# Patient Record
Sex: Male | Born: 1935 | Race: White | Hispanic: No | Smoking: Never smoker
Health system: Southern US, Community
[De-identification: ages and names within clinical notes are randomized; demographics above are authoritative.]

## PROBLEM LIST (undated history)

## (undated) DIAGNOSIS — C159 Malignant neoplasm of esophagus, unspecified: Secondary | ICD-10-CM

## (undated) DIAGNOSIS — Z8546 Personal history of malignant neoplasm of prostate: Secondary | ICD-10-CM

## (undated) DIAGNOSIS — E785 Hyperlipidemia, unspecified: Secondary | ICD-10-CM

## (undated) DIAGNOSIS — H9319 Tinnitus, unspecified ear: Secondary | ICD-10-CM

## (undated) DIAGNOSIS — Z933 Colostomy status: Secondary | ICD-10-CM

## (undated) DIAGNOSIS — I10 Essential (primary) hypertension: Secondary | ICD-10-CM

## (undated) DIAGNOSIS — R17 Unspecified jaundice: Secondary | ICD-10-CM

## (undated) DIAGNOSIS — Z860100 Personal history of colon polyps, unspecified: Secondary | ICD-10-CM

## (undated) DIAGNOSIS — C20 Malignant neoplasm of rectum: Secondary | ICD-10-CM

## (undated) DIAGNOSIS — K219 Gastro-esophageal reflux disease without esophagitis: Secondary | ICD-10-CM

## (undated) DIAGNOSIS — Z936 Other artificial openings of urinary tract status: Secondary | ICD-10-CM

## (undated) DIAGNOSIS — B179 Acute viral hepatitis, unspecified: Secondary | ICD-10-CM

## (undated) DIAGNOSIS — Z973 Presence of spectacles and contact lenses: Secondary | ICD-10-CM

## (undated) DIAGNOSIS — M1990 Unspecified osteoarthritis, unspecified site: Secondary | ICD-10-CM

## (undated) DIAGNOSIS — N179 Acute kidney failure, unspecified: Secondary | ICD-10-CM

## (undated) DIAGNOSIS — D72829 Elevated white blood cell count, unspecified: Secondary | ICD-10-CM

## (undated) DIAGNOSIS — Z8719 Personal history of other diseases of the digestive system: Secondary | ICD-10-CM

## (undated) HISTORY — PX: OTHER SURGICAL HISTORY: SHX169

## (undated) HISTORY — DX: Unspecified osteoarthritis, unspecified site: M19.90

## (undated) HISTORY — DX: Malignant neoplasm of esophagus, unspecified: C15.9

## (undated) HISTORY — DX: Essential (primary) hypertension: I10

## (undated) HISTORY — DX: Gastro-esophageal reflux disease without esophagitis: K21.9

## (undated) HISTORY — PX: HERNIA REPAIR: SHX51

## (undated) HISTORY — DX: Colostomy status: Z93.3

## (undated) HISTORY — DX: Malignant neoplasm of rectum: C20

## (undated) HISTORY — PX: COLONOSCOPY: SHX174

---
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
## 1998-10-27 ENCOUNTER — Ambulatory Visit (HOSPITAL_COMMUNITY): Admission: RE | Admit: 1998-10-27 | Discharge: 1998-10-27 | Payer: Self-pay | Admitting: Gastroenterology

## 1998-11-05 ENCOUNTER — Other Ambulatory Visit: Admission: RE | Admit: 1998-11-05 | Discharge: 1998-11-05 | Payer: Self-pay | Admitting: Urology

## 1998-11-17 ENCOUNTER — Encounter: Admission: RE | Admit: 1998-11-17 | Discharge: 1999-02-15 | Payer: Self-pay | Admitting: Radiation Oncology

## 1999-01-19 DIAGNOSIS — Z8546 Personal history of malignant neoplasm of prostate: Secondary | ICD-10-CM

## 1999-01-19 HISTORY — DX: Personal history of malignant neoplasm of prostate: Z85.46

## 1999-02-06 ENCOUNTER — Encounter: Payer: Self-pay | Admitting: Urology

## 1999-02-06 ENCOUNTER — Ambulatory Visit (HOSPITAL_BASED_OUTPATIENT_CLINIC_OR_DEPARTMENT_OTHER): Admission: RE | Admit: 1999-02-06 | Discharge: 1999-02-06 | Payer: Self-pay | Admitting: Urology

## 1999-03-04 ENCOUNTER — Encounter: Admission: RE | Admit: 1999-03-04 | Discharge: 1999-06-02 | Payer: Self-pay | Admitting: Radiation Oncology

## 2000-11-30 ENCOUNTER — Encounter (INDEPENDENT_AMBULATORY_CARE_PROVIDER_SITE_OTHER): Payer: Self-pay | Admitting: Specialist

## 2000-11-30 ENCOUNTER — Ambulatory Visit (HOSPITAL_COMMUNITY): Admission: RE | Admit: 2000-11-30 | Discharge: 2000-11-30 | Payer: Self-pay | Admitting: Gastroenterology

## 2004-11-17 ENCOUNTER — Ambulatory Visit: Payer: Self-pay | Admitting: Gastroenterology

## 2004-11-30 ENCOUNTER — Encounter (INDEPENDENT_AMBULATORY_CARE_PROVIDER_SITE_OTHER): Payer: Self-pay | Admitting: Specialist

## 2004-11-30 ENCOUNTER — Ambulatory Visit: Payer: Self-pay | Admitting: Gastroenterology

## 2005-02-11 ENCOUNTER — Ambulatory Visit: Payer: Self-pay | Admitting: Gastroenterology

## 2005-03-04 ENCOUNTER — Ambulatory Visit: Payer: Self-pay | Admitting: Gastroenterology

## 2005-11-02 ENCOUNTER — Ambulatory Visit (HOSPITAL_COMMUNITY): Admission: RE | Admit: 2005-11-02 | Discharge: 2005-11-02 | Payer: Self-pay | Admitting: Family Medicine

## 2006-07-14 ENCOUNTER — Ambulatory Visit (HOSPITAL_COMMUNITY): Admission: RE | Admit: 2006-07-14 | Discharge: 2006-07-14 | Payer: Self-pay | Admitting: Family Medicine

## 2008-06-14 ENCOUNTER — Ambulatory Visit (HOSPITAL_COMMUNITY): Admission: RE | Admit: 2008-06-14 | Discharge: 2008-06-14 | Payer: Self-pay | Admitting: Family Medicine

## 2010-05-22 ENCOUNTER — Ambulatory Visit: Payer: Self-pay | Admitting: Urology

## 2010-05-29 ENCOUNTER — Ambulatory Visit (INDEPENDENT_AMBULATORY_CARE_PROVIDER_SITE_OTHER): Payer: Medicare Other | Admitting: Urology

## 2010-05-29 DIAGNOSIS — Z8546 Personal history of malignant neoplasm of prostate: Secondary | ICD-10-CM

## 2010-05-29 DIAGNOSIS — N4340 Spermatocele of epididymis, unspecified: Secondary | ICD-10-CM

## 2010-05-29 DIAGNOSIS — N529 Male erectile dysfunction, unspecified: Secondary | ICD-10-CM

## 2010-06-05 NOTE — Op Note (Signed)
[facility]  Patient:    [name]                       MRN: [id number] Proc. Date: 02/06/99 Adm. Date:  [id number] Attending:  Evlyn Clines CC:         Billie Lade, M.D.             Vania Rea. Jarold Motto, M.D. [facility]                           Operative Report  PREOPERATIVE DIAGNOSIS:  Localized prostate cancer.  POSTOPERATIVE DIAGNOSIS:  Localized prostate cancer.  PROCEDURE:  Prostate I-125 seed implantation and cystoscopy.  SURGEON:  Excell Seltzer. Annabell Howells, M.D.  RADIATION ONCOLOGIST:  Billie Lade, M.D.  ANESTHESIA:  General.  DRAINS:  Foley.  COMPLICATIONS:  None.  INDICATIONS:  The patient is an 77 year old white male who was found to have PSA of 6 and nodule on rectal exam.  He was biopsied and found to have a Gleason VI adenocarcinoma involving 10% of the left prostate and some focal glandular atypia. After discussing the treatment options, he elected seed implantation.  FINDINGS AND DESCRIPTION OF PROCEDURE:  The patient was taken to the operating oom where he received Ancef.  A general anesthetic was induced.  He was placed in the lithotomy position with sling stirrups.  His genitalia was prepped with Betadine solution and a Foley catheter was inserted.  The balloon was filled with dilute  contrast.  The perineum was shaved.  The scrotum was reflected cephalad with a towel and Op-Site.  A red rubber rectal catheter was placed.  The ultrasound probe was assembled and inserted, and adjusted to conform with the pretreatment plan.   Once the ultrasound was secured, the perineum was prepped with Betadine solution and he was draped with a sterile towel.  The needle guide template was in place. The ultrasound was set at a reference plane of 1.5 cm from the base.  An anchor  needle was placed at C3.0 and measurements were made to ensure proper location rom the base.  That needle was removed and two stabilization  needles were then placed at C3.0 and E3.0.  The needles were then placed according to plan.  A total of  80 seeds were implanted with 20 needles, all of them with four strand Vicryl. he implant went without complication.  After completion of the implantation, films were taken with the probe in and out. The drapes were then removed and the Foley catheter was removed.  The penis was  reprepped.  Cystoscopy was performed with a 16-French flexible scope. Examination revealed a normal urethra.  Prostate ________.  No evidence of suture or seeds were noted in the urethra.  The bladder was also free of any seeds.  The bladder was without lesions.  The ureteral orifices were in their normal anatomic position with efflux of clear urine.  After completion of cystoscopy, a fresh Foley catheter was inserted.  The balloon was filled with 5 cc of sterile fluid, and the catheter was placed to straight drainage.  The perineum was cleansed and a dressing was applied.  The patient was taken down from the lithotomy position, anesthetic was reversed, and he was moved to the recovery room in stable condition.  There were no complications. DD:  02/06/99 TD:  02/07/99 Job: [id number]

## 2010-12-04 ENCOUNTER — Ambulatory Visit (INDEPENDENT_AMBULATORY_CARE_PROVIDER_SITE_OTHER): Payer: Medicare Other | Admitting: Urology

## 2010-12-04 DIAGNOSIS — N529 Male erectile dysfunction, unspecified: Secondary | ICD-10-CM

## 2010-12-04 DIAGNOSIS — Z8546 Personal history of malignant neoplasm of prostate: Secondary | ICD-10-CM

## 2010-12-04 DIAGNOSIS — N5089 Other specified disorders of the male genital organs: Secondary | ICD-10-CM

## 2011-08-13 ENCOUNTER — Ambulatory Visit (INDEPENDENT_AMBULATORY_CARE_PROVIDER_SITE_OTHER): Payer: Medicare Other | Admitting: Urology

## 2011-08-13 DIAGNOSIS — N529 Male erectile dysfunction, unspecified: Secondary | ICD-10-CM

## 2011-08-13 DIAGNOSIS — Z8546 Personal history of malignant neoplasm of prostate: Secondary | ICD-10-CM

## 2011-08-13 DIAGNOSIS — N4340 Spermatocele of epididymis, unspecified: Secondary | ICD-10-CM

## 2011-11-12 ENCOUNTER — Encounter (INDEPENDENT_AMBULATORY_CARE_PROVIDER_SITE_OTHER): Payer: Self-pay | Admitting: *Deleted

## 2011-11-17 ENCOUNTER — Encounter: Payer: Self-pay | Admitting: Gastroenterology

## 2011-11-17 ENCOUNTER — Encounter (INDEPENDENT_AMBULATORY_CARE_PROVIDER_SITE_OTHER): Payer: Self-pay | Admitting: Internal Medicine

## 2011-11-17 ENCOUNTER — Ambulatory Visit (INDEPENDENT_AMBULATORY_CARE_PROVIDER_SITE_OTHER): Payer: Medicare Other | Admitting: Internal Medicine

## 2011-11-17 VITALS — BP 144/70 | HR 60 | Temp 98.1°F | Ht 68.0 in | Wt 199.0 lb

## 2011-11-17 DIAGNOSIS — C61 Malignant neoplasm of prostate: Secondary | ICD-10-CM | POA: Insufficient documentation

## 2011-11-17 DIAGNOSIS — E7800 Pure hypercholesterolemia, unspecified: Secondary | ICD-10-CM | POA: Insufficient documentation

## 2011-11-17 DIAGNOSIS — R197 Diarrhea, unspecified: Secondary | ICD-10-CM

## 2011-11-17 DIAGNOSIS — I10 Essential (primary) hypertension: Secondary | ICD-10-CM | POA: Insufficient documentation

## 2011-11-17 DIAGNOSIS — K635 Polyp of colon: Secondary | ICD-10-CM | POA: Insufficient documentation

## 2011-11-17 DIAGNOSIS — Z86010 Personal history of colonic polyps: Secondary | ICD-10-CM

## 2011-11-17 NOTE — Patient Instructions (Addendum)
F/u with Dr. Jarold Motto concerning hx of polyps. Over due for surveillance colonoscopy.

## 2011-11-17 NOTE — Progress Notes (Signed)
Subjective:     Patient ID: Ian Christensen, male   DOB: Dec 18, 1931, 78 y.o.   MRN: [id number]  HPIReferred to our office for a positive Lactoferrin.  He tells me he ate pork about 3 months that was not done.  He tells me after that he developed diarrhea. Diarrhea lasted about 1 week.  He was 2-3 loose stools a day. He also had urgency. He had to be near the bathroom. He continued to have loose stools. Symptoms resolved in the past 2 weeks. No weight loss. BMs are almost normal now. Last colonoscopy has been in the last 10 yrs. He says he has colonic polyps.  Hx of colonic polyps.  Colonoscopy in 2006: Dr. Jarold Motto: FINAL DIAGNOSIS   MICROSCOPIC EXAMINATION AND DIAGNOSIS   COLON, ASCENDING POLYP: TUBULAR ADENOMA. NO HIGH GRADE DYSPLASIA OR MALIGNANCY IDENTIFIED.    Review of Systems  See hpi Current Outpatient Prescriptions  Medication Sig Dispense Refill  . hydrochlorothiazide (HYDRODIURIL) 25 MG tablet Take 25 mg by mouth daily.      Marland Kitchen losartan (COZAAR) 50 MG tablet Take 50 mg by mouth daily.      . metoprolol succinate (TOPROL-XL) 50 MG 24 hr tablet Take 50 mg by mouth daily. Take with or immediately following a meal.      . omeprazole (PRILOSEC) 40 MG capsule Take 40 mg by mouth daily.      . simvastatin (ZOCOR) 20 MG tablet Take 20 mg by mouth every evening.       Past Medical History  Diagnosis Date  . Prostate cancer     in remission 2001. (seed implants)  . High cholesterol   . HTN (hypertension)    Past Surgical History  Procedure Date  . Appendectomy    History   Social History  . Marital Status: Married    Spouse Name: N/A    Number of Children: N/A  . Years of Education: N/A   Occupational History  . Not on file.   Social History Main Topics  . Smoking status: Never Smoker   . Smokeless tobacco: Not on file  . Alcohol Use: No  . Drug Use: No  . Sexually Active: Not on file   Other Topics Concern  . Not on file   Social History Narrative  . No  narrative on file   Family Status  Relation Status Death Age  . Mother Deceased     age 6  . Father Deceased     CAD age 110  . Sister Deceased     Two deceased from stomach cancer. One in a nursing home with Alzheimer's  . Brother Deceased     Two CAD, One MVC, One prostate cancer.   Not on File     Objective:   Physical Exam  Filed Vitals:   11/17/11 1027  BP: 144/70  Pulse: 60  Temp: 98.1 F (36.7 C)  Height: 5\' 8"  (1.727 m)  Weight: 199 lb (90.266 kg)   Alert and oriented. Skin warm and dry. Oral mucosa is moist.   . Sclera anicteric, conjunctivae is pink. Thyroid not enlarged. No cervical lymphadenopathy. Lungs clear. Heart regular rate and rhythm.  Abdomen is soft. Bowel sounds are positive. No hepatomegaly. No abdominal masses felt. No tenderness.  No edema to lower extremities.       Assessment:    Diarrhea, infectious, resolved at this time. Positive lactoferrin.     Plan:    I think patient may need follow up  colonoscopy for hx of colonic tubular adenoma . Patient would like for Dr. Jarold Motto to do the colonoscopy. Declined colonoscopy today with Dr. Karilyn Cota.

## 2011-12-03 ENCOUNTER — Encounter (INDEPENDENT_AMBULATORY_CARE_PROVIDER_SITE_OTHER): Payer: Self-pay

## 2011-12-22 ENCOUNTER — Ambulatory Visit (AMBULATORY_SURGERY_CENTER): Payer: Medicare Other

## 2011-12-22 ENCOUNTER — Encounter: Payer: Self-pay | Admitting: Gastroenterology

## 2011-12-22 VITALS — Ht 68.0 in | Wt 197.0 lb

## 2011-12-22 DIAGNOSIS — Z1211 Encounter for screening for malignant neoplasm of colon: Secondary | ICD-10-CM

## 2011-12-22 MED ORDER — MOVIPREP 100 G PO SOLR: 1.0000 | Freq: Once | ORAL | Status: DC

## 2011-12-28 ENCOUNTER — Telehealth: Payer: Self-pay | Admitting: Gastroenterology

## 2011-12-28 NOTE — Telephone Encounter (Signed)
Pt reports he would like to cancel his COLON for 01/05/12. He reports he got sick after eating bad pork at a gathering and that's what caused the diarrhea. He hasn't had any bowel changes or bleeding or any other signs of Colon Cancer; his doctor is the one who suggested he have the COLON and he received a Recall letter. He states he will call if he wants to r/s. Advised pt if he had a polyp 7 years ago, he needs to consider a repeat COLON; states he know that but doesn't feel he needs one now.

## 2012-01-05 ENCOUNTER — Encounter: Payer: Medicare Other | Admitting: Gastroenterology

## 2012-08-04 ENCOUNTER — Ambulatory Visit (INDEPENDENT_AMBULATORY_CARE_PROVIDER_SITE_OTHER): Payer: Medicare Other | Admitting: Urology

## 2012-08-04 DIAGNOSIS — C61 Malignant neoplasm of prostate: Secondary | ICD-10-CM

## 2012-08-04 DIAGNOSIS — N529 Male erectile dysfunction, unspecified: Secondary | ICD-10-CM

## 2012-08-04 DIAGNOSIS — N4340 Spermatocele of epididymis, unspecified: Secondary | ICD-10-CM

## 2013-01-26 ENCOUNTER — Ambulatory Visit (INDEPENDENT_AMBULATORY_CARE_PROVIDER_SITE_OTHER): Payer: Medicare Other | Admitting: Urology

## 2013-01-26 DIAGNOSIS — Z8546 Personal history of malignant neoplasm of prostate: Secondary | ICD-10-CM

## 2013-01-26 DIAGNOSIS — N4340 Spermatocele of epididymis, unspecified: Secondary | ICD-10-CM

## 2013-03-01 ENCOUNTER — Ambulatory Visit (INDEPENDENT_AMBULATORY_CARE_PROVIDER_SITE_OTHER): Payer: Medicare Other | Admitting: Otolaryngology

## 2013-03-01 DIAGNOSIS — H6980 Other specified disorders of Eustachian tube, unspecified ear: Secondary | ICD-10-CM

## 2013-03-01 DIAGNOSIS — H903 Sensorineural hearing loss, bilateral: Secondary | ICD-10-CM

## 2013-03-01 DIAGNOSIS — H6990 Unspecified Eustachian tube disorder, unspecified ear: Secondary | ICD-10-CM

## 2013-08-03 ENCOUNTER — Ambulatory Visit (INDEPENDENT_AMBULATORY_CARE_PROVIDER_SITE_OTHER): Payer: Medicare Other | Admitting: Urology

## 2013-08-03 ENCOUNTER — Other Ambulatory Visit: Payer: Self-pay | Admitting: Urology

## 2013-08-03 DIAGNOSIS — N529 Male erectile dysfunction, unspecified: Secondary | ICD-10-CM

## 2013-08-03 DIAGNOSIS — C61 Malignant neoplasm of prostate: Secondary | ICD-10-CM

## 2013-08-03 DIAGNOSIS — N4340 Spermatocele of epididymis, unspecified: Secondary | ICD-10-CM

## 2013-09-19 ENCOUNTER — Encounter (HOSPITAL_COMMUNITY): Payer: Self-pay

## 2013-09-19 ENCOUNTER — Encounter (HOSPITAL_COMMUNITY)
Admission: RE | Admit: 2013-09-19 | Discharge: 2013-09-19 | Disposition: A | Discharge status: Home / Self Care | Payer: Medicare Other | Source: Ambulatory Visit | Attending: Urology | Admitting: Urology

## 2013-09-19 DIAGNOSIS — C61 Malignant neoplasm of prostate: Secondary | ICD-10-CM | POA: Diagnosis present

## 2013-09-19 MED ORDER — TECHNETIUM TC 99M MEDRONATE IV KIT: 25.0000 | PACK | Freq: Once | INTRAVENOUS | Status: AC | PRN

## 2014-02-08 ENCOUNTER — Ambulatory Visit: Payer: Self-pay | Admitting: Urology

## 2014-08-16 ENCOUNTER — Ambulatory Visit: Payer: Self-pay | Admitting: Urology

## 2014-09-27 ENCOUNTER — Ambulatory Visit (INDEPENDENT_AMBULATORY_CARE_PROVIDER_SITE_OTHER): Payer: Medicare HMO | Admitting: Urology

## 2014-09-27 ENCOUNTER — Telehealth: Payer: Self-pay | Admitting: Gastroenterology

## 2014-09-27 DIAGNOSIS — N4340 Spermatocele of epididymis, unspecified: Secondary | ICD-10-CM

## 2014-09-27 DIAGNOSIS — K629 Disease of anus and rectum, unspecified: Secondary | ICD-10-CM

## 2014-09-27 DIAGNOSIS — C61 Malignant neoplasm of prostate: Secondary | ICD-10-CM

## 2014-09-27 NOTE — Telephone Encounter (Signed)
Bethena Roys aware of appt and will call pt with appt. Bethena Roys to fax records.

## 2014-09-27 NOTE — Telephone Encounter (Signed)
Pt scheduled to see Cecille Rubin Hvozdovic, PA-C 09/30/14@2 :15pm. Left message for Bethena Roys to call back.

## 2014-09-30 ENCOUNTER — Encounter: Payer: Self-pay | Admitting: Physician Assistant

## 2014-09-30 ENCOUNTER — Ambulatory Visit (INDEPENDENT_AMBULATORY_CARE_PROVIDER_SITE_OTHER): Payer: Medicare HMO | Admitting: Physician Assistant

## 2014-09-30 ENCOUNTER — Telehealth: Payer: Self-pay

## 2014-09-30 VITALS — BP 144/70 | HR 68 | Ht 66.5 in | Wt 198.0 lb

## 2014-09-30 DIAGNOSIS — R197 Diarrhea, unspecified: Secondary | ICD-10-CM | POA: Diagnosis not present

## 2014-09-30 DIAGNOSIS — K625 Hemorrhage of anus and rectum: Secondary | ICD-10-CM

## 2014-09-30 DIAGNOSIS — Z8546 Personal history of malignant neoplasm of prostate: Secondary | ICD-10-CM

## 2014-09-30 MED ORDER — POLYETHYLENE GLYCOL 3350 17 GM/SCOOP PO POWD: 1.0000 | Freq: Every day | ORAL | Status: DC

## 2014-09-30 NOTE — Progress Notes (Signed)
Patient ID: Ian Christensen, male   DOB: [birth year], 81 y.o.   MRN: [id number]    HPI:  Ian Christensen is a 82 y.o.   male referred by Shawnie Dapper, PA-C for evaluation of rectal bleeding. At his prior patient of Dr. Jarold Motto and had several colonoscopies with polyps. His last colonoscopy was on 11/30/2004 at which time a 5 mm sessile polyp was removed from the ascending colon. This was found to be adenomatous and he was advised to have surveillance in 3 years however he did not do so. He was evaluated at Dr. Cathie Beams office in 2013 for what was felt to be an infectious diarrhea that had recent altered. He was advised to have a surveillance colonoscopy but declined to have it done as he preferred to return to Dr. Norval Gable office.  He has a history of prostate cancer diagnosed in 2000 for which he was treated with radiation seed implants. He was evaluated by urology for follow-up on 09/27/2014 and noted to have had an increase in his PSA with no worrisome symptoms. He was also noted to have a mucosal irregularity of the rectum over the prostate bed. He was advised to see GI for evaluation. Patient states he had been moving his bowels regularly, but several weeks ago went to a restaurant that he frequents intermittently. He states he made a comment to a fellow costumer in [employer] and he did not realize was a relative of the cook. He states he ordered soup, and when the soup was brought to his table it looked as if something funny was floating on it. He ate the soup and said it tasted fine, but states that night he began to have loose stools. Since that time he has been having 2-4 mushy stools with mucus and occasionally blood. He has constant rectal pressure where he feels he has to go to the bathroom but frequently nothing comes out. He sometimes has to strain to pass a mushy stool. He has had a dull ache in the low back that comes and goes as well. He has had no associated nausea or vomiting, and  denies fever chills or night sweats. He did not have any Tobi Bastos biotics over the past several months and has not traveled out of the country.   Past Medical History  Diagnosis Date  . Prostate cancer     in remission 2001. (seed implants)  . High cholesterol   . HTN (hypertension)   . Anal fissure   . Arthritis   . Colon polyps   . GERD (gastroesophageal reflux disease)     Past Surgical History  Procedure Laterality Date  . Appendectomy  1955   Family History  Problem Relation Age of Onset  . Colon cancer Sister   . Prostate cancer Brother   . Diabetes Mother   . Heart disease Mother   . Heart disease Father   . Colon polyps Daughter     x 2   Social History  Substance Use Topics  . Smoking status: Former Smoker -- 1 years    Types: Cigarettes    Quit date: 09/30/1954  . Smokeless tobacco: Never Used  . Alcohol Use: No   Current Outpatient Prescriptions  Medication Sig Dispense Refill  . hydrochlorothiazide (HYDRODIURIL) 25 MG tablet Take 12.5 mg by mouth daily.     Marland Kitchen losartan (COZAAR) 50 MG tablet Take 50 mg by mouth daily.    . metoprolol succinate (TOPROL-XL) 50 MG 24 hr  tablet Take 50 mg by mouth daily. Take with or immediately following a meal.    . niacin 500 MG tablet Take 500 mg by mouth daily with breakfast.    . omeprazole (PRILOSEC) 20 MG capsule Take 1 capsule by mouth daily.    . simvastatin (ZOCOR) 20 MG tablet Take 20 mg by mouth every evening.    . polyethylene glycol powder (GLYCOLAX/MIRALAX) powder Take 255 g by mouth daily. 255 g 0   No current facility-administered medications for this visit.   No Known Allergies   Review of Systems: Gen: Denies any fever, chills, sweats, anorexia, fatigue, weakness, malaise, weight loss, and sleep disorder CV: Denies chest pain, angina, palpitations, syncope, orthopnea, PND, peripheral edema, and claudication. Resp: Denies dyspnea at rest, dyspnea with exercise, cough, sputum, wheezing, coughing up blood, and  pleurisy. GI: Denies vomiting blood, jaundice, and fecal incontinence.   Denies dysphagia or odynophagia. GU : Denies urinary burning, blood in urine, urinary frequency, urinary hesitancy, nocturnal urination, and urinary incontinence. MS: Denies joint pain, limitation of movement, and swelling, stiffness, low back pain, extremity pain. Denies muscle weakness, cramps, atrophy.  Derm: Denies rash, itching, dry skin, hives, moles, warts, or unhealing ulcers.  Psych: Denies depression, anxiety, memory loss, suicidal ideation, hallucinations, paranoia, and confusion. Heme: Denies bruising, bleeding, and enlarged lymph nodes. Neuro:  Denies any headaches, dizziness, paresthesias. Endo:  Denies any problems with DM, thyroid, adrenal function   Prior Endoscopies:  See history of present illness   Physical Exam: BP 144/70 mmHg  Pulse 68  Ht 5' 6.5" (1.689 m)  Wt 198 lb (89.812 kg)  BMI 31.48 kg/m2 Constitutional: Pleasant,well-developed, male in no acute distress. HEENT: Normocephalic and atraumatic. Conjunctivae are normal. No scleral icterus. Neck supple. No JVD Cardiovascular: Normal rate, regular rhythm.  Pulmonary/chest: Effort normal and breath sounds normal. No wheezing, rales or rhonchi. Abdominal: Soft, nondistended, nontender. Bowel sounds active throughout. There are no masses palpable. No hepatomegaly. Rectal: Bloody mucus on glove Extremities: no edema Lymphadenopathy: No cervical adenopathy noted. Neurological: Alert and oriented to person place and time. Skin: Skin is warm and dry. No rashes noted. Psychiatric: Normal mood and affect. Behavior is normal.  ASSESSMENT AND PLAN: 81 year old male with a history of prostate cancer status post seed implants, now with a 2-2-1/2 week history of loose stools with mucous and blood along with tenesmus. Recent exam by urology questions or rectal mass. Patient also has a history of adenomatous polyps and is overdue for surveillance. A C.  difficile PCR will be obtained. Patient will be scheduled for a colonoscopy to evaluate for polyps, neoplasia, proctitis, etc.The risks, benefits, and alternatives to colonoscopy with possible biopsy and possible polypectomy were discussed with the patient and they consent to proceed. The procedure will be scheduled with Dr. Leone Payor per patient request.    Kyli Sorter, Tollie Pizza PA-C 09/30/2014, 4:07 PM  CC: Shawnie Dapper, PA-C

## 2014-09-30 NOTE — Telephone Encounter (Signed)
Called pt and informed him to stop by our lab to pick up a stool kit. Pt understands.

## 2014-09-30 NOTE — Patient Instructions (Signed)
You have been scheduled for a colonoscopy. Please follow written instructions given to you at your visit today.  Please pick up your prep supplies at the pharmacy within the next 1-3 days. If you use inhalers (even only as needed), please bring them with you on the day of your procedure. Your physician has requested that you go to www.startemmi.com and enter the access code given to you at your visit today. This web site gives a general overview about your procedure. However, you should still follow specific instructions given to you by our office regarding your preparation for the procedure.  We have sent the following medications to your pharmacy for you to pick up at your convenience: Miralax

## 2014-10-02 ENCOUNTER — Ambulatory Visit (AMBULATORY_SURGERY_CENTER): Payer: Medicare HMO | Admitting: Internal Medicine

## 2014-10-02 ENCOUNTER — Other Ambulatory Visit: Payer: Medicare HMO

## 2014-10-02 ENCOUNTER — Other Ambulatory Visit (INDEPENDENT_AMBULATORY_CARE_PROVIDER_SITE_OTHER): Payer: Medicare HMO

## 2014-10-02 ENCOUNTER — Encounter: Payer: Self-pay | Admitting: Internal Medicine

## 2014-10-02 VITALS — BP 143/85 | HR 64 | Temp 97.1°F | Resp 17 | Ht 66.5 in | Wt 198.0 lb

## 2014-10-02 DIAGNOSIS — R198 Other specified symptoms and signs involving the digestive system and abdomen: Secondary | ICD-10-CM | POA: Diagnosis not present

## 2014-10-02 DIAGNOSIS — K6289 Other specified diseases of anus and rectum: Secondary | ICD-10-CM

## 2014-10-02 DIAGNOSIS — Z8546 Personal history of malignant neoplasm of prostate: Secondary | ICD-10-CM

## 2014-10-02 DIAGNOSIS — K625 Hemorrhage of anus and rectum: Secondary | ICD-10-CM

## 2014-10-02 DIAGNOSIS — C20 Malignant neoplasm of rectum: Secondary | ICD-10-CM | POA: Diagnosis not present

## 2014-10-02 DIAGNOSIS — R197 Diarrhea, unspecified: Secondary | ICD-10-CM

## 2014-10-02 LAB — COMPREHENSIVE METABOLIC PANEL
ALBUMIN: 4.2 g/dL (ref 3.5–5.2)
ALK PHOS: 51 U/L (ref 39–117)
ALT: 16 U/L (ref 0–53)
AST: 20 U/L (ref 0–37)
BUN: 14 mg/dL (ref 6–23)
CALCIUM: 9.3 mg/dL (ref 8.4–10.5)
CHLORIDE: 101 meq/L (ref 96–112)
CO2: 27 mEq/L (ref 19–32)
Creatinine, Ser: 1.16 mg/dL (ref 0.40–1.50)
GFR: 64.2 mL/min (ref >60.00)
Glucose, Bld: 100 mg/dL — ABNORMAL HIGH (ref 70–99)
POTASSIUM: 3.7 meq/L (ref 3.5–5.1)
SODIUM: 139 meq/L (ref 135–145)
TOTAL PROTEIN: 6.7 g/dL (ref 6.0–8.3)
Total Bilirubin: 0.6 mg/dL (ref 0.2–1.2)

## 2014-10-02 LAB — CBC WITH DIFFERENTIAL/PLATELET
BASOS PCT: 0.8 % (ref 0.0–3.0)
Basophils Absolute: 0.1 10*3/uL (ref 0.0–0.1)
EOS PCT: 1.4 % (ref 0.0–5.0)
Eosinophils Absolute: 0.1 10*3/uL (ref 0.0–0.7)
HEMATOCRIT: 45.7 % (ref 39.0–52.0)
HEMOGLOBIN: 15.6 g/dL (ref 13.0–17.0)
LYMPHS PCT: 18 % (ref 12.0–46.0)
Lymphs Abs: 1.4 10*3/uL (ref 0.7–4.0)
MCHC: 34.1 g/dL (ref 30.0–36.0)
MCV: 94.2 fl (ref 78.0–100.0)
MONOS PCT: 6.9 % (ref 3.0–12.0)
Monocytes Absolute: 0.6 10*3/uL (ref 0.1–1.0)
NEUTROS ABS: 5.8 10*3/uL (ref 1.4–7.7)
Neutrophils Relative %: 72.9 % (ref 43.0–77.0)
PLATELETS: 213 10*3/uL (ref 150.0–400.0)
RBC: 4.85 Mil/uL (ref 4.22–5.81)
RDW: 12.7 % (ref 11.5–15.5)
WBC: 8 10*3/uL (ref 4.0–10.5)

## 2014-10-02 LAB — PSA: PSA: 7.48 ng/mL — ABNORMAL HIGH (ref 0.10–4.00)

## 2014-10-02 MED ORDER — SODIUM CHLORIDE 0.9 % IV SOLN: 500.0000 mL | INTRAVENOUS | Status: DC

## 2014-10-02 NOTE — Progress Notes (Signed)
Called to room to assist during endoscopic procedure.  Patient ID and intended procedure confirmed with present staff. Received instructions for my participation in the procedure from the performing physician.  

## 2014-10-02 NOTE — Op Note (Signed)
[facility]  [address] [place], [id number]   COLONOSCOPY PROCEDURE REPORT  PATIENT: Ian, Christensen  MR#: [id number] BIRTHDATE: 1931-08-12 , 83  yrs. old GENDER: male ENDOSCOPIST: Gatha Mayer, MD, [facility] PROCEDURE DATE:  10/02/2014 PROCEDURE:   Colonoscopy with biopsy First Screening Colonoscopy - Avg.  risk and is 50 yrs.  old or older - No.  Prior Negative Screening - Now for repeat screening. N/A  History of Adenoma - Now for follow-up colonoscopy & has been > or = to 3 yrs.  N/A  Polyps removed today? No Recommend repeat exam, <10 yrs? No ASA CLASS:   Class II INDICATIONS:Evaluation of unexplained GI bleeding and Patient is not applicable for Colorectal Neoplasm Risk Assessment for this procedure. MEDICATIONS: Propofol 200 mg IV and Monitored anesthesia care  DESCRIPTION OF PROCEDURE:   After the risks benefits and alternatives of the procedure were thoroughly explained, informed consent was obtained.  The digital rectal exam revealed a palpable rectal mass.   The LB [id number]  endoscope was introduced through the anus and advanced to the cecum, which was identified by both the appendix and ileocecal valve. No adverse events experienced.   The quality of the prep was excellent.  (MiraLax was used)  The instrument was then slowly withdrawn as the colon was fully examined. Estimated blood loss is zero unless otherwise noted in this procedure report.   COLON FINDINGS: A one-third circumferential medium firm and ulcerated mass was found in the rectum. Close to anal verge. Multiple biopsies were performed using cold forceps.   There was mild diverticulosis noted throughout the entire examined colon. showed as described above. The time to cecum = 9.7 Withdrawal time = 6.7   The scope was withdrawn and the procedure completed. COMPLICATIONS: There were no immediate complications.  ENDOSCOPIC IMPRESSION: 1.   One-third circumferential medium mass  was found in the distal rectum; ulcerated, firm anterior over prostate bed, multiple biopsies were performed using cold forceps- suspect carcinoma ? rectal vs recurrence of prostate 2.   There was mild diverticulosis noted throughout the entire examined colon  RECOMMENDATIONS: 1.  Office will call with the results. 2.  CBC, CMET, CEA PSA today 3. Will send home with contrast in anticipation of CT scanning pending pathlogy review  eSigned:  Gatha Mayer, MD, [facility] 10/02/2014 8:59 AM   cc: Collene Mares, PA-C and The Patient

## 2014-10-02 NOTE — Progress Notes (Signed)
Transferred to recovery room. A/O x3, pleased with MAC.  VSS.  Report to Celia, RN. 

## 2014-10-02 NOTE — Patient Instructions (Addendum)
There is an ulcerated mass in your rectum that looks like cancer, unfortunately. i took biopsies and will hopefully know more tomorrow - will call.  I need you to get labs today and very likely to need CT scans soon also.  I appreciate the opportunity to care for you. Gatha Mayer, MD, [facility]   Discharge instructions given. Handout on diverticulosis. Contrast bottles given in recovery room. Will go down to lab for blood work after discharged. YOU HAD AN ENDOSCOPIC PROCEDURE TODAY AT [facility]:   Refer to the procedure report that was given to you for any specific questions about what was found during the examination.  If the procedure report does not answer your questions, please call your gastroenterologist to clarify.  If you requested that your care partner not be given the details of your procedure findings, then the procedure report has been included in a sealed envelope for you to review at your convenience later.  YOU SHOULD EXPECT: Some feelings of bloating in the abdomen. Passage of more gas than usual.  Walking can help get rid of the air that was put into your GI tract during the procedure and reduce the bloating. If you had a lower endoscopy (such as a colonoscopy or flexible sigmoidoscopy) you may notice spotting of blood in your stool or on the toilet paper. If you underwent a bowel prep for your procedure, you may not have a normal bowel movement for a few days.  Please Note:  You might notice some irritation and congestion in your nose or some drainage.  This is from the oxygen used during your procedure.  There is no need for concern and it should clear up in a day or so.  SYMPTOMS TO REPORT IMMEDIATELY:   Following lower endoscopy (colonoscopy or flexible sigmoidoscopy):  Excessive amounts of blood in the stool  Significant tenderness or worsening of abdominal pains  Swelling of the abdomen that is new, acute  Fever of 100F or higher   For  urgent or emergent issues, a gastroenterologist can be reached at any hour by calling [phone].   DIET: Your first meal following the procedure should be a small meal and then it is ok to progress to your normal diet. Heavy or fried foods are harder to digest and may make you feel nauseous or bloated.  Likewise, meals heavy in dairy and vegetables can increase bloating.  Drink plenty of fluids but you should avoid alcoholic beverages for 24 hours.  ACTIVITY:  You should plan to take it easy for the rest of today and you should NOT DRIVE or use heavy machinery until tomorrow (because of the sedation medicines used during the test).    FOLLOW UP: Our staff will call the number listed on your records the next business day following your procedure to check on you and address any questions or concerns that you may have regarding the information given to you following your procedure. If we do not reach you, we will leave a message.  However, if you are feeling well and you are not experiencing any problems, there is no need to return our call.  We will assume that you have returned to your regular daily activities without incident.  If any biopsies were taken you will be contacted by phone or by letter within the next 1-3 weeks.  Please call us at [phone] if you have not heard about the biopsies in 3 weeks.    SIGNATURES/CONFIDENTIALITY:  You and/or your care partner have signed paperwork which will be entered into your electronic medical record.  These signatures attest to the fact that that the information above on your After Visit Summary has been reviewed and is understood.  Full responsibility of the confidentiality of this discharge information lies with you and/or your care-partner.

## 2014-10-03 ENCOUNTER — Telehealth: Payer: Self-pay

## 2014-10-03 LAB — CEA: CEA: <0.5 ng/mL (ref 0.0–5.0)

## 2014-10-03 LAB — CLOSTRIDIUM DIFFICILE BY PCR

## 2014-10-03 NOTE — Telephone Encounter (Signed)
  Follow up Call-  Call back number 10/02/2014  Post procedure Call Back phone  # [phone]  Permission to leave phone message Yes     Patient questions:  Do you have a fever, pain , or abdominal swelling? No. Pain Score  0 *  Have you tolerated food without any problems? Yes.    Have you been able to return to your normal activities? Yes.    Do you have any questions about your discharge instructions: Diet   No. Medications  No. Follow up visit  No.  Do you have questions or concerns about your Care? No.  Actions: * If pain score is 4 or above: No action needed, pain <4.

## 2014-10-04 ENCOUNTER — Telehealth: Payer: Self-pay | Admitting: Internal Medicine

## 2014-10-04 NOTE — Telephone Encounter (Signed)
I spoke with the patient's daughter and notified her of the results of the labs.  See labs for additional details

## 2014-10-04 NOTE — Progress Notes (Signed)
Quick Note:  Labs ok except slight increase PSA - still waiting on pathology of rectal mass - ? Rectal cancer vs a recurrence of prostate   Please let him and daughter Ian Christensen know this - I had expected result yesterday but did not get it - should have info by Monday I think ______

## 2014-10-07 ENCOUNTER — Telehealth: Payer: Self-pay | Admitting: *Deleted

## 2014-10-07 ENCOUNTER — Encounter: Payer: Self-pay | Admitting: Internal Medicine

## 2014-10-07 ENCOUNTER — Other Ambulatory Visit: Payer: Self-pay

## 2014-10-07 DIAGNOSIS — C20 Malignant neoplasm of rectum: Secondary | ICD-10-CM

## 2014-10-07 HISTORY — DX: Malignant neoplasm of rectum: C20

## 2014-10-07 NOTE — Progress Notes (Signed)
Quick Note:  I called results of rectal cancer to patient He will need colon recall 1 year Vinnie Level to do)  I left a message w/ daughter Jackelyn Poling that we will call her (his preference) re: appointments  He needs: 1) Appointment Dr. Marcello Moores [facility] 2) CT chest, abd and pelvis w/ contrast re: rectal cancer evalualte extent of disease - he has oral contrast 3) i will notify Dr. Jeffie Pollock who Tx is prostate Ca years ago - PSA is up 4) If CT studies w/o mets then will set up EUS (Dr. Ardis Hughs) - I have explained all this to patient 5) place on cancer conference list for future week (not this week) and I have cced navigator Merceda Elks 6) set up oncology appt Dr. Ammie Dalton or Burr Medico also   ______

## 2014-10-07 NOTE — Telephone Encounter (Signed)
Spoke with daughter, Bernette Mayers regarding referral to medical oncology. Inquired if she/patient wants to purse his oncology care in [place] or stay closer to home in [place] at [facility]. Could go to [facility] for radiation therapy. She works for [employer] and would like  to care for him, but understands the drive could become an issue. She will speak with patient today and call back tomorrow with decision. Patient is currently driving himself. He lives in [place], but she is in [place].

## 2014-10-07 NOTE — Progress Notes (Signed)
Quick Note:  Ian Christensen not an option for surgery appt but ok to do CT scans and can see Dr. Whitney Muse at cancer center there If needs XRT can be seen there for that also ______

## 2014-10-08 ENCOUNTER — Ambulatory Visit (INDEPENDENT_AMBULATORY_CARE_PROVIDER_SITE_OTHER)
Admission: RE | Admit: 2014-10-08 | Discharge: 2014-10-08 | Disposition: A | Discharge status: Home / Self Care | Payer: Medicare HMO | Source: Ambulatory Visit | Attending: Internal Medicine | Admitting: Internal Medicine

## 2014-10-08 DIAGNOSIS — C20 Malignant neoplasm of rectum: Secondary | ICD-10-CM

## 2014-10-08 MED ORDER — IOHEXOL 300 MG/ML  SOLN
100.0000 mL | Freq: Once | INTRAMUSCULAR | Status: AC | PRN
  Administered 2014-10-08: 100 mL via INTRAVENOUS

## 2014-10-09 ENCOUNTER — Telehealth: Payer: Self-pay | Admitting: *Deleted

## 2014-10-09 ENCOUNTER — Encounter (HOSPITAL_COMMUNITY): Payer: Self-pay | Admitting: *Deleted

## 2014-10-09 ENCOUNTER — Other Ambulatory Visit: Payer: Self-pay

## 2014-10-09 DIAGNOSIS — C20 Malignant neoplasm of rectum: Secondary | ICD-10-CM

## 2014-10-09 NOTE — Progress Notes (Signed)
Quick Note:  Will wait for conference re: additional imaging  thanks ______

## 2014-10-09 NOTE — Progress Notes (Signed)
Quick Note:  Let patient and his daughter Ian Christensen know that there is no obvious spread of cancer but some ? With kidney lesions and lung nodules that may need other testing pending other MD input.  As far as EUS - will defer to Dr. Marcello Moores and Ardis Hughs re: should this be done now? Seems likely to me  Am ccing them ______

## 2014-10-09 NOTE — Progress Notes (Signed)
Quick Note:  Please do - at his age etc will be medical vs surgical discussions I bet regardless of EUS results  ______

## 2014-10-09 NOTE — Telephone Encounter (Signed)
Daughter called back late 9/20 to report that Ian Christensen wishes to stay in [place] for his medical oncology care. IF RT is needed, would be OK to go to [place]. EPIC message sent to Dr. Whitney Muse at [facility] and HIM sending records and referral to [facility].

## 2014-10-10 ENCOUNTER — Ambulatory Visit (HOSPITAL_COMMUNITY)
Admission: RE | Admit: 2014-10-10 | Discharge: 2014-10-10 | Disposition: A | Discharge status: Home / Self Care | Payer: Medicare HMO | Source: Ambulatory Visit | Attending: Gastroenterology | Admitting: Gastroenterology

## 2014-10-10 ENCOUNTER — Encounter (HOSPITAL_COMMUNITY)
Admission: RE | Disposition: A | Discharge status: Home / Self Care | Payer: Self-pay | Source: Ambulatory Visit | Attending: Gastroenterology

## 2014-10-10 ENCOUNTER — Encounter (HOSPITAL_COMMUNITY): Payer: Self-pay | Admitting: Gastroenterology

## 2014-10-10 DIAGNOSIS — Z8546 Personal history of malignant neoplasm of prostate: Secondary | ICD-10-CM | POA: Diagnosis not present

## 2014-10-10 DIAGNOSIS — E780 Pure hypercholesterolemia: Secondary | ICD-10-CM | POA: Insufficient documentation

## 2014-10-10 DIAGNOSIS — Z923 Personal history of irradiation: Secondary | ICD-10-CM | POA: Diagnosis not present

## 2014-10-10 DIAGNOSIS — I10 Essential (primary) hypertension: Secondary | ICD-10-CM | POA: Diagnosis not present

## 2014-10-10 DIAGNOSIS — Z79899 Other long term (current) drug therapy: Secondary | ICD-10-CM | POA: Insufficient documentation

## 2014-10-10 DIAGNOSIS — C20 Malignant neoplasm of rectum: Secondary | ICD-10-CM | POA: Diagnosis not present

## 2014-10-10 DIAGNOSIS — M1990 Unspecified osteoarthritis, unspecified site: Secondary | ICD-10-CM | POA: Insufficient documentation

## 2014-10-10 DIAGNOSIS — Z86010 Personal history of colonic polyps: Secondary | ICD-10-CM | POA: Diagnosis not present

## 2014-10-10 DIAGNOSIS — K219 Gastro-esophageal reflux disease without esophagitis: Secondary | ICD-10-CM | POA: Insufficient documentation

## 2014-10-10 DIAGNOSIS — R197 Diarrhea, unspecified: Secondary | ICD-10-CM | POA: Diagnosis present

## 2014-10-10 DIAGNOSIS — Z87891 Personal history of nicotine dependence: Secondary | ICD-10-CM | POA: Diagnosis not present

## 2014-10-10 HISTORY — PX: EUS: SHX5427

## 2014-10-10 SURGERY — ULTRASOUND, LOWER GI TRACT, ENDOSCOPIC
Anesthesia: Moderate Sedation

## 2014-10-10 MED ORDER — FENTANYL CITRATE (PF) 100 MCG/2ML IJ SOLN
INTRAMUSCULAR | Status: DC | PRN
  Administered 2014-10-10 (×2): 25 ug via INTRAVENOUS

## 2014-10-10 MED ORDER — MIDAZOLAM HCL 10 MG/2ML IJ SOLN
INTRAMUSCULAR | Status: DC | PRN
  Administered 2014-10-10 (×2): 2 mg via INTRAVENOUS

## 2014-10-10 MED ORDER — FENTANYL CITRATE (PF) 100 MCG/2ML IJ SOLN
INTRAMUSCULAR | Status: AC
  Filled 2014-10-10: qty 2

## 2014-10-10 MED ORDER — MIDAZOLAM HCL 5 MG/ML IJ SOLN
INTRAMUSCULAR | Status: AC
  Filled 2014-10-10: qty 1

## 2014-10-10 MED ORDER — SODIUM CHLORIDE 0.9 % IV SOLN
INTRAVENOUS | Status: DC
  Administered 2014-10-10: 500 mL via INTRAVENOUS

## 2014-10-10 NOTE — Interval H&P Note (Signed)
History and Physical Interval Note:  10/10/2014 12:06 PM  Ian Christensen  has presented today for surgery, with the diagnosis of rectal carcinoma  The various methods of treatment have been discussed with the patient and family. After consideration of risks, benefits and other options for treatment, the patient has consented to  Procedure(s): LOWER ENDOSCOPIC ULTRASOUND (EUS) (N/A) as a surgical intervention .  The patient's history has been reviewed, patient examined, no change in status, stable for surgery.  I have reviewed the patient's chart and labs.  Questions were answered to the patient's satisfaction.     Milus Banister

## 2014-10-10 NOTE — Interval H&P Note (Signed)
History and Physical Interval Note:  10/10/2014 2:02 PM  Ian Christensen  has presented today for surgery, with the diagnosis of rectal carcinoma  The various methods of treatment have been discussed with the patient and family. After consideration of risks, benefits and other options for treatment, the patient has consented to  Procedure(s): LOWER ENDOSCOPIC ULTRASOUND (EUS) (N/A) as a surgical intervention .  The patient's history has been reviewed, patient examined, no change in status, stable for surgery.  I have reviewed the patient's chart and labs.  Questions were answered to the patient's satisfaction.     Milus Banister

## 2014-10-10 NOTE — H&P (View-Only) (Signed)
Patient ID: Ian Christensen, male   DOB: [birth year], 81 y.o.   MRN: [id number]    HPI:  Ian Christensen is a 81 y.o.   male referred by Shawnie Dapper, PA-C for evaluation of rectal bleeding. At his prior patient of Dr. Jarold Motto and had several colonoscopies with polyps. His last colonoscopy was on 11/30/2004 at which time a 5 mm sessile polyp was removed from the ascending colon. This was found to be adenomatous and he was advised to have surveillance in 3 years however he did not do so. He was evaluated at Dr. Cathie Beams office in 2013 for what was felt to be an infectious diarrhea that had recent altered. He was advised to have a surveillance colonoscopy but declined to have it done as he preferred to return to Dr. Norval Gable office.  He has a history of prostate cancer diagnosed in 2000 for which he was treated with radiation seed implants. He was evaluated by urology for follow-up on 09/27/2014 and noted to have had an increase in his PSA with no worrisome symptoms. He was also noted to have a mucosal irregularity of the rectum over the prostate bed. He was advised to see GI for evaluation. Patient states he had been moving his bowels regularly, but several weeks ago went to a restaurant that he frequents intermittently. He states he made a comment to a fellow costumer in [employer] and he did not realize was a relative of the cook. He states he ordered soup, and when the soup was brought to his table it looked as if something funny was floating on it. He ate the soup and said it tasted fine, but states that night he began to have loose stools. Since that time he has been having 2-4 mushy stools with mucus and occasionally blood. He has constant rectal pressure where he feels he has to go to the bathroom but frequently nothing comes out. He sometimes has to strain to pass a mushy stool. He has had a dull ache in the low back that comes and goes as well. He has had no associated nausea or vomiting, and  denies fever chills or night sweats. He did not have any Tobi Bastos biotics over the past several months and has not traveled out of the country.   Past Medical History  Diagnosis Date  . Prostate cancer     in remission 2001. (seed implants)  . High cholesterol   . HTN (hypertension)   . Anal fissure   . Arthritis   . Colon polyps   . GERD (gastroesophageal reflux disease)     Past Surgical History  Procedure Laterality Date  . Appendectomy  1955   Family History  Problem Relation Age of Onset  . Colon cancer Sister   . Prostate cancer Brother   . Diabetes Mother   . Heart disease Mother   . Heart disease Father   . Colon polyps Daughter     x 2   Social History  Substance Use Topics  . Smoking status: Former Smoker -- 1 years    Types: Cigarettes    Quit date: 09/30/1954  . Smokeless tobacco: Never Used  . Alcohol Use: No   Current Outpatient Prescriptions  Medication Sig Dispense Refill  . hydrochlorothiazide (HYDRODIURIL) 25 MG tablet Take 12.5 mg by mouth daily.     Marland Kitchen losartan (COZAAR) 50 MG tablet Take 50 mg by mouth daily.    . metoprolol succinate (TOPROL-XL) 50 MG 24 hr  tablet Take 50 mg by mouth daily. Take with or immediately following a meal.    . niacin 500 MG tablet Take 500 mg by mouth daily with breakfast.    . omeprazole (PRILOSEC) 20 MG capsule Take 1 capsule by mouth daily.    . simvastatin (ZOCOR) 20 MG tablet Take 20 mg by mouth every evening.    . polyethylene glycol powder (GLYCOLAX/MIRALAX) powder Take 255 g by mouth daily. 255 g 0   No current facility-administered medications for this visit.   No Known Allergies   Review of Systems: Gen: Denies any fever, chills, sweats, anorexia, fatigue, weakness, malaise, weight loss, and sleep disorder CV: Denies chest pain, angina, palpitations, syncope, orthopnea, PND, peripheral edema, and claudication. Resp: Denies dyspnea at rest, dyspnea with exercise, cough, sputum, wheezing, coughing up blood, and  pleurisy. GI: Denies vomiting blood, jaundice, and fecal incontinence.   Denies dysphagia or odynophagia. GU : Denies urinary burning, blood in urine, urinary frequency, urinary hesitancy, nocturnal urination, and urinary incontinence. MS: Denies joint pain, limitation of movement, and swelling, stiffness, low back pain, extremity pain. Denies muscle weakness, cramps, atrophy.  Derm: Denies rash, itching, dry skin, hives, moles, warts, or unhealing ulcers.  Psych: Denies depression, anxiety, memory loss, suicidal ideation, hallucinations, paranoia, and confusion. Heme: Denies bruising, bleeding, and enlarged lymph nodes. Neuro:  Denies any headaches, dizziness, paresthesias. Endo:  Denies any problems with DM, thyroid, adrenal function   Prior Endoscopies:  See history of present illness   Physical Exam: BP 144/70 mmHg  Pulse 68  Ht 5' 6.5" (1.689 m)  Wt 198 lb (89.812 kg)  BMI 31.48 kg/m2 Constitutional: Pleasant,well-developed, male in no acute distress. HEENT: Normocephalic and atraumatic. Conjunctivae are normal. No scleral icterus. Neck supple. No JVD Cardiovascular: Normal rate, regular rhythm.  Pulmonary/chest: Effort normal and breath sounds normal. No wheezing, rales or rhonchi. Abdominal: Soft, nondistended, nontender. Bowel sounds active throughout. There are no masses palpable. No hepatomegaly. Rectal: Bloody mucus on glove Extremities: no edema Lymphadenopathy: No cervical adenopathy noted. Neurological: Alert and oriented to person place and time. Skin: Skin is warm and dry. No rashes noted. Psychiatric: Normal mood and affect. Behavior is normal.  ASSESSMENT AND PLAN: 81 year old male with a history of prostate cancer status post seed implants, now with a 2-2-1/2 week history of loose stools with mucous and blood along with tenesmus. Recent exam by urology questions or rectal mass. Patient also has a history of adenomatous polyps and is overdue for surveillance. A C.  difficile PCR will be obtained. Patient will be scheduled for a colonoscopy to evaluate for polyps, neoplasia, proctitis, etc.The risks, benefits, and alternatives to colonoscopy with possible biopsy and possible polypectomy were discussed with the patient and they consent to proceed. The procedure will be scheduled with Dr. Leone Payor per patient request.    Kyli Sorter, Tollie Pizza PA-C 09/30/2014, 4:07 PM  CC: Shawnie Dapper, PA-C

## 2014-10-10 NOTE — Discharge Instructions (Signed)

## 2014-10-10 NOTE — Op Note (Signed)
[facility] [place], [id number]   ENDOSCOPIC ULTRASOUND PROCEDURE REPORT  PATIENT: Kasin, Tonkinson  MR#: [id number] BIRTHDATE: Jun 15, 1931  GENDER: male ENDOSCOPIST: Milus Banister, MD REFERRED BY:  Gatha Mayer, M.D, [facility] PROCEDURE DATE:  10/10/2014 PROCEDURE:   Lower EUS ASA CLASS:      Class II INDICATIONS:   1.  newly diagnosed rectal adenocarcinoma, remote prostate cancer Rx'd with radioactive seed implants. MEDICATIONS: Fentanyl 50 mcg IV and Versed 4 mg IV  DESCRIPTION OF PROCEDURE:   After the risks benefits and alternatives of the procedure were  explained, informed consent was obtained. The patient was then placed in the left, lateral, decubitus postion and IV sedation was administered. Throughout the procedure, the patients blood pressure, pulse and oxygen saturations were monitored continuously.  Under direct visualization, the Pentax Radial EUS [id number]  endoscope was introduced through the anus  and advanced to the sigmoid colon . Water was used as necessary to provide an acoustic interface.  Upon completion of the imaging, water was removed and the patient was sent to the recovery room in satisfactory condition.  Sigmoidoscopic findings: 1. Ulcerated flat mass in the distal rectum. This was non-circumferential, 3.5cm across, located along the anterior wall of the distal rectum, distal edge 1-2cm from the anal verge.  EUS findings: 1. The mass above correlated with a 3.4cm hypoechoic mass that clearly invaded into and through the muscularis propria layer of the distal rectall wall (uT3). The mass extends towards the prostate but does not appear to invade into it. Previous hyperchoic seed implants in prostate also noted. 2. There were two (2) small perirectal lymphnodes at the same level as the mass. These were 5 and 70mm, round, discrete, suspicious for malignant involvement (uN1b).  ENDOSCOPIC IMPRESSION: Non-circumferential,  3.4cm uT3N1b (Stage IIIB) rectal adenocarcinoma along the anterior wall of the distal rectum with distal edge 1-2cm from the anal verge.  RECOMMENDATIONS: Await further staging workup.  _______________________________ eSigned:  Milus Banister, MD 88/11/313 9:45 PM   OP:[id number] Marcello Moores, MD

## 2014-10-11 ENCOUNTER — Encounter (HOSPITAL_COMMUNITY): Payer: Self-pay | Admitting: Gastroenterology

## 2014-10-15 ENCOUNTER — Encounter (HOSPITAL_COMMUNITY): Payer: Medicare HMO | Attending: Hematology & Oncology | Admitting: Hematology & Oncology

## 2014-10-15 VITALS — BP 159/75 | HR 68 | Temp 98.0°F | Resp 18 | Ht 67.0 in | Wt 196.2 lb

## 2014-10-15 DIAGNOSIS — N2889 Other specified disorders of kidney and ureter: Secondary | ICD-10-CM | POA: Diagnosis not present

## 2014-10-15 DIAGNOSIS — R918 Other nonspecific abnormal finding of lung field: Secondary | ICD-10-CM

## 2014-10-15 NOTE — Progress Notes (Signed)
[facility] NOTE  Patient Care Team: Redmond School, MD as PCP - General (Internal Medicine)  CHIEF COMPLAINTS/PURPOSE OF CONSULTATION:  Stage IIIB rectal adenocarcinoma Colonoscopy on 10/02/2014 with one third circumferential medium mass found in the distal rectum, ulcerated, firm anterior over prostate bed with multiple biopsies, final pathology c/w adenocarcinoma GI primary CT of the chest abdomen and pelvis on 10/08/2014 showing wall thickening of the rectum, multiple bilateral pulmonary nodules, nodular soft tissue within the left main stem bronchus which is nonspecific, no evidence of focal hepatic lesion, bilateral low attenuation renal lesions with internal densities greater than that of fluid, indeterminate History of prostate cancer treated with brachytherapy, followed by Dr. Jeffie Pollock  EUS on 10/10/2014 with final staging uT3 uN1b, stage IIIB rectal adenocarcinoma along the anterior wall of the distal rectum with distal edge 1-2 cm from the anal verge  HISTORY OF PRESENTING ILLNESS:  Ian Christensen 81 y.o. male is here because of Stage IIIB rectal carcinoma.  He is here today with his youngest daughter.  The patient was diagnosed with prostate cancer in 2001.  He was treated by Dr. Jeffie Pollock.  Dr. Jeffie Pollock has continuously followed his PSA levels since then.  During his last prostate examination, he experienced light rectal bleeding.  The bleeding increasing in a large amount once he got home.  He has not bled anymore since this time.  He ultimately underwent a colonoscopy which revealed a rectal mass. His EUS completed by Dr. Ardis Hughs.  He complains of back pain.  He denies experiencing pain anywhere else.  He notes that before the bleeding occurrence, he had severe diarrhea.  This was about 2 months ago.  There were 3 occurences where he could not make it to the bathroom.  No other incontinence of his bowels since this episode. He denies ever seeing blood in his  bowel movements prior to his prostate exam.  He denies urine incontinence however he notes that "when he has to go, he has to go".  His daughter is up to date with the patient's condition and what they have been told so far with the results of his ultrasound and biopsy.  She notes they are aware that the patient may not be a candidate for further radiation due to his prior brachytherapy.  He has a follow up appointment with Dr. Marcello Moores on 10/22/2014.  He does not have an appointment to consult with radiation yet.  His case will be presented a tumor board tomorrow.  He denies problems with his appetite.  Denies weight loss.  He notes that he has anxiety.  He is currently having trouble sleeping.  He reports that he does not feel bad at all.  The patient has no other concerns at this time.    MEDICAL HISTORY:  Past Medical History  Diagnosis Date  . High cholesterol   . HTN (hypertension)   . Anal fissure   . Arthritis   . Colon polyps   . GERD (gastroesophageal reflux disease)   . Prostate cancer     in remission 2001. (seed implants)  . Rectal cancer 10/07/2014    SURGICAL HISTORY: Past Surgical History  Procedure Laterality Date  . Appendectomy  1955  . Colonoscopy      removes 1 polp  . Eus N/A 10/10/2014    Procedure: LOWER ENDOSCOPIC ULTRASOUND (EUS);  Surgeon: Milus Banister, MD;  Location: [facility];  Service: Endoscopy;  Laterality: N/A;    SOCIAL  HISTORY: Social History   Social History  . Marital Status: Married    Spouse Name: N/A  . Number of Children: 2  . Years of Education: N/A   Occupational History  . retired    Social History Main Topics  . Smoking status: Former Smoker -- 1 years    Types: Cigarettes    Quit date: 09/30/1954  . Smokeless tobacco: Never Used  . Alcohol Use: 0.0 oz/week    0 Standard drinks or equivalent per week     Comment: rarely  . Drug Use: No  . Sexual Activity: Not on file   Other Topics Concern  . Not on file   Social  History Narrative  2 children, 3 grandchildren, 1 expecting great grandchild Married Previously employed as a Psychologist, sport and exercise, tobacco raising Non-smoker ETOH, none Hobbies included golfing.  He no longer plays   FAMILY HISTORY: Family History  Problem Relation Age of Onset  . Colon cancer Sister   . Prostate cancer Brother   . Diabetes Mother   . Heart disease Mother   . Heart disease Father   . Colon polyps Daughter     x 2   indicated that his mother is deceased. He indicated that his father is deceased. He indicated that his sister is deceased. He indicated that his brother is deceased.  Mother deceased, 37 Father deceased, 85, heart attack 6 brothers, 3 sisters.  1 brother deceased from prostate cancer 1 sister deceased from colon cancer (31) 1 brother deceased from heart attack  ALLERGIES:  has No Known Allergies.  MEDICATIONS:  Current Outpatient Prescriptions  Medication Sig Dispense Refill  . ascorbic acid (VITAMIN C) 500 MG tablet Take 500 mg by mouth daily.    Marland Kitchen aspirin EC 81 MG tablet Take 81 mg by mouth daily.    . Cyanocobalamin (VITAMIN B 12 PO) Take 1 tablet by mouth daily.    . hydrochlorothiazide (HYDRODIURIL) 25 MG tablet Take 12.5 mg by mouth daily.     Marland Kitchen losartan (COZAAR) 50 MG tablet Take 50 mg by mouth daily.    . metoprolol succinate (TOPROL-XL) 50 MG 24 hr tablet Take 50 mg by mouth daily. Take with or immediately following a meal.    . niacin 500 MG CR capsule Take 500 mg by mouth at bedtime.    Marland Kitchen omeprazole (PRILOSEC) 20 MG capsule Take 1 capsule by mouth daily.    . simvastatin (ZOCOR) 20 MG tablet Take 20 mg by mouth every evening.     No current facility-administered medications for this visit.    Review of Systems  Constitutional: Negative.   HENT: Negative.   Eyes: Negative.   Respiratory: Negative.   Cardiovascular: Negative.   Gastrointestinal: Positive for diarrhea and blood in stool.  Genitourinary: Positive for urgency. Negative for  dysuria, frequency, hematuria and flank pain.  Musculoskeletal: Positive for back pain. Negative for myalgias, joint pain, falls and neck pain.  Skin: Negative.   Neurological: Negative.   Endo/Heme/Allergies: Negative.   Psychiatric/Behavioral: Negative.   All other systems reviewed and are negative.  14 point ROS was done and is otherwise as detailed above or in HPI    PHYSICAL EXAMINATION: ECOG PERFORMANCE STATUS: 1 - Symptomatic but completely ambulatory  Filed Vitals:   10/15/14 1407  BP: 159/75  Pulse: 68  Temp: 98 F (36.7 C)  Resp: 18   Filed Weights   10/15/14 1407  Weight: 196 lb 3.2 oz (88.996 kg)    Physical Exam  Constitutional: He is oriented to person, place, and time and well-developed, well-nourished, and in no distress.  Robust for age  HENT:  Head: Normocephalic and atraumatic.  Nose: Nose normal.  Mouth/Throat: Oropharynx is clear and moist. No oropharyngeal exudate.  Eyes: Conjunctivae and EOM are normal. Pupils are equal, round, and reactive to light. Right eye exhibits no discharge. Left eye exhibits no discharge. No scleral icterus.  Neck: Normal range of motion. Neck supple. No tracheal deviation present. No thyromegaly present.  Cardiovascular: Normal rate, regular rhythm and normal heart sounds.  Exam reveals no gallop and no friction rub.   No murmur heard. Pulmonary/Chest: Effort normal and breath sounds normal. He has no wheezes. He has no rales.  Abdominal: Soft. Bowel sounds are normal. He exhibits no distension and no mass. There is no tenderness. There is no rebound and no guarding.  Genitourinary:  Rectal deferred today  Musculoskeletal: Normal range of motion. He exhibits no edema.  Lymphadenopathy:    He has no cervical adenopathy.  Neurological: He is alert and oriented to person, place, and time. He has normal reflexes. No cranial nerve deficit. Gait normal. Coordination normal.  Skin: Skin is warm and dry. No rash noted.    Psychiatric: Mood, memory, affect and judgment normal.  Nursing note and vitals reviewed.     LABORATORY DATA:  I have reviewed the data as listed Lab Results  Component Value Date   WBC 8.0 10/02/2014   HGB 15.6 10/02/2014   HCT 45.7 10/02/2014   MCV 94.2 10/02/2014   PLT 213.0 10/02/2014   @LASTCHEM @      RADIOGRAPHIC STUDIES: I have personally reviewed the radiological images as listed and agreed with the findings in the report. CLINICAL DATA: Patient with newly diagnosed rectal carcinoma. Remote history of prostate cancer in 2001.  EXAM: CT CHEST, ABDOMEN, AND PELVIS WITH CONTRAST  TECHNIQUE: Multidetector CT imaging of the chest, abdomen and pelvis was performed following the standard protocol during bolus administration of intravenous contrast.  CONTRAST: 183mL OMNIPAQUE IOHEXOL 300 MG/ML SOLN  COMPARISON: Bone scan 09/19/2013  FINDINGS: CT CHEST FINDINGS  Mediastinum/Lymph Nodes: Visualized thyroid is unremarkable. No enlarged axillary, mediastinal or hilar lymphadenopathy. The heart is normal in size. No pericardial effusion. Aorta and main pulmonary artery normal in caliber. Coronary arterial vascular calcifications.  Lungs/Pleura: Central airways are patent. There are scattered calcified and noncalcified pulmonary nodules throughout the lungs bilaterally. Reference nodules are as follows: 6 mm left lower lobe nodule (image 47; series 3) ; adjacent nodules within the left fissure measuring 5 mm and 3 mm (image 31, 30 ; series 3); 3 mm right upper lobe nodule (image 32; series 3); 4 mm right lower lobe nodule (image 44; series 3). Dependent atelectasis within the bilateral lower lobes. No pleural effusion or pneumothorax. Bandlike opacity within the left lower lobe.  CT ABDOMEN PELVIS FINDINGS  Hepatobiliary: The liver is normal size and contour. No definite focal hepatic lesion small densities within the gallbladder lumen may  represent small gallstones.  Pancreas: Unremarkable  Spleen: Unremarkable  Adrenals/Urinary Tract: Normal adrenal glands. Kidneys enhance symmetrically with contrast. There is a 2.3 cm exophytic cyst off the superior pole of the left kidney. Off of the inferior pole of the left kidney there is an 11 mm low-attenuation lesion within internal density greater that fluid (image 22; series 602). 10 mm low-attenuation lesion inferior pole right kidney (image 76; series 2) with an internal density greater than that of fluid.  Stomach/Bowel: No evidence  for bowel obstruction. Oral contrast material demonstrated to the level of the colon. Duodenum diverticulum. There is eccentric wall thickening of rectum (image 125; series 2), most compatible with recently diagnosed rectal carcinoma. Tiny adjacent perirectal lymph nodes, subcentimeter in size.  Vascular/Lymphatic: Normal caliber abdominal aorta with peripheral calcified atherosclerotic plaque.  Other: Multiple seeds demonstrated within the prostate. Fat containing left inguinal hernia.  Musculoskeletal: Lumbar spine degenerative changes. No aggressive or acute appearing osseous lesions.  IMPRESSION: 1. Wall thickening of the rectum compatible with recently diagnosed rectal carcinoma. Tiny subcentimeter perirectal lymph nodes. 2. Multiple bilateral pulmonary nodules as above. While these may be infectious or inflammatory in etiology, metastatic disease in the setting of known rectal carcinoma is not excluded. 3. Nodular soft tissue within the left mainstem bronchus which is nonspecific and may represent mucus however endobronchial nodule is not excluded. Recommend either attention on followup or correlation with bronchoscopy if the patient is having respiratory symptoms. 4. No evidence for focal hepatic lesion. 5. Bilateral low-attenuation renal lesions with internal densities greater than that of fluid, indeterminate.  Recommend attention on followup or definitive characterization with pre and post contrast-enhanced MRI. These results will be called to the ordering clinician or representative by the Radiologist Assistant, and communication documented in the PACS or zVision Dashboard.   Electronically Signed  By: Lovey Newcomer M.D.  On: 10/08/2014 16:59      ASSESSMENT & PLAN:  Stage IIIB adenocarcinoma of the rectum Prostate Cancer treated with bracytherapy CT imaging with pulmonary nodules (too small for biopsy or PET imaging) CT with bilateral low-attenuation renal lesions with internal densities greater than that of fluid, indeterminate  I spent time with the patient and his daughter discussing the general treatment of stage III rectal cancer. We discussed that he may not be a candidate for neoadjuvant concurrent chemoradiation because of his prior brachytherapy. I advised them that this will be addressed in tumor Board tomorrow. If he is not a candidate for neo-adjuvant concurrent therapy I advised them that there is no proven role for neo-adjuvant chemotherapy only, I anticipate then that he will move forward with surgery first. Most likely we will then just proceed with 6 months of adjuvant chemotherapy.  We discussed the benefits of chemotherapy in locally advanced rectal cancers. He is quite robust and may be able to tolerate FOLFOX, we will reassess this moving forward.  I discussed the results of his CT scans. I am not sure of the utility of PET CT imaging given the size of his pulmonary lesions. I think most likely these will need to be followed moving forward. MRI imaging of the kidneys could be done prior to surgery if Tumor Board feels that this is very necessary. If not we will follow the renal findings moving forward as well.  Would like to get the records in regards to his prostate cancer. My understanding is that his PSA has recently begun to rise.  All questions were answered.  The patient knows to call the clinic with any problems, questions or concerns.   This document serves as a record of services personally performed by Ancil Linsey, MD. It was created on her behalf by Janace Hoard, a trained medical scribe. The creation of this record is based on the scribe's personal observations and the provider's statements to them. This document has been checked and approved by the attending provider.  I have reviewed the above documentation for accuracy and completeness, and I agree with the above.  This note was  electronically signed.   Kelby Fam. Whitney Muse, MD

## 2014-10-15 NOTE — Patient Instructions (Signed)
[facility] at [facility] Discharge Instructions  RECOMMENDATIONS MADE BY THE CONSULTANT AND ANY TEST RESULTS WILL BE SENT TO YOUR REFERRING PHYSICIAN.  Exam completed by Dr Whitney Muse today We will call you after tumor board meets [email or web address] to see what the next steps will be  Thank you for choosing [place] at [facility] to provide your oncology and hematology care.  To afford each patient quality time with our provider, please arrive at least 15 minutes before your scheduled appointment time.    You need to re-schedule your appointment should you arrive 10 or more minutes late.  We strive to give you quality time with our providers, and arriving late affects you and other patients whose appointments are after yours.  Also, if you no show three or more times for appointments you may be dismissed from the clinic at the providers discretion.     Again, thank you for choosing [facility]  Our hope is that these requests will decrease the amount of time that you wait before being seen by our physicians.       _____________________________________________________________  Should you have questions after your visit to [facility], please contact our office at [phone] between the hours of 8:30 a.m. and 4:30 p.m.  Voicemails left after 4:30 p.m. will not be returned until the following business day.  For prescription refill requests, have your pharmacy contact our office.

## 2014-10-17 ENCOUNTER — Encounter (HOSPITAL_COMMUNITY): Payer: Self-pay | Admitting: Hematology & Oncology

## 2014-10-17 DIAGNOSIS — N2889 Other specified disorders of kidney and ureter: Secondary | ICD-10-CM | POA: Insufficient documentation

## 2014-10-17 DIAGNOSIS — R918 Other nonspecific abnormal finding of lung field: Secondary | ICD-10-CM | POA: Insufficient documentation

## 2014-10-22 ENCOUNTER — Other Ambulatory Visit: Payer: Self-pay | Admitting: General Surgery

## 2014-10-22 NOTE — H&P (Signed)
Beverly Milch Pirri 10/22/2014 9:20 AM Location: [facility] Patient #: [id number] DOB: 1931/05/01 Widowed / Language: [place] / Race: White Male History of Present Illness Romie Levee MD; 10/22/2014 12:08 PM) The patient is a 81 year old male who presents with colorectal cancer. A 41-year-old male who presents to the office with a new diagnosis of rectal adenocarcinoma. A rectal mass was found during digital rectal exam by Dr. Wilson Singer for his prostate cancer. The patient has undergone radiation seed therapy for prostate cancer. A colonoscopy revealed a large partially circumferential distal mass. Biopsy confirmed adenocarcinoma. Patient denies any rectal bleeding. He is having regular bowel movements. He underwent CT scans of the chest abdomen and pelvis. There were some concerning lung nodules and renal cysts noted, but all of these were small. US shows a T3 rectal lesion 1 cm from anal verge with N1b lymph nodes (Stage 3b). CEA was <0.5. We discussed his case in multidisciplinary GI tumor conference. He is unable to receive any further radiation due to his prostate seed implantation. It was recommended that we proceed with surgery and follow-up the renal lesions and lung lesions postoperatively. Problem List/Past Medical Romie Levee, MD; 10/22/2014 12:51 PM) PRIMARY CANCER OF RECTUM (C20)  Other Problems Romie Levee, MD; 10/22/2014 12:51 PM) Other disease, cancer, significant illness Prostate Cancer Hypercholesterolemia Hemorrhoids High blood pressure Rectal Cancer Back Pain  Past Surgical History Romie Levee, MD; 10/22/2014 12:51 PM) Colon Polyp Removal - Colonoscopy Appendectomy  Diagnostic Studies History Romie Levee, MD; 10/22/2014 12:51 PM) Colonoscopy within last year  Allergies Fay Records, CMA; 10/22/2014 9:20 AM) No Known Drug Allergies 10/09/2014  Medication History Romie Levee, MD; 10/22/2014 12:51 PM) Hydrochlorothiazide (25MG   Tablet, Oral) Active. Losartan Potassium (50MG  Tablet, Oral) Active. Metoprolol Succinate ER (50MG  Tablet ER 24HR, Oral) Active. Omeprazole (20MG  Capsule DR, Oral) Active. Niacin ER (Antihyperlipidemic) (500MG  Tablet ER, Oral) Active. Simvastatin (20MG  Tablet, Oral) Active. Medications Reconciled Neomycin Sulfate (500MG  Tablet, 2 (two) Tablet Oral SEE NOTE, Taken starting 10/22/2014) Active. (TAKE TWO TABLETS AT 2 PM, 3 PM, AND 10 PM THE DAY PRIOR TO SURGERY) Flagyl (500MG  Tablet, 2 (two) Tablet Oral SEE NOTE, Taken starting 10/22/2014) Active. (Take at 2pm, 3pm, and 10pm the day prior to your colon operation)  Social History Romie Levee, MD; 10/22/2014 12:51 PM) Tobacco use Never smoker. No drug use Alcohol use Occasional alcohol use. Caffeine use Coffee, Tea.  Family History Romie Levee, MD; 10/22/2014 12:51 PM) Cerebrovascular Accident Brother. Colon Cancer Mother. Ovarian Cancer Sister. Prostate Cancer Brother. Heart disease in male family member before age 82 Diabetes Mellitus Mother. Heart Disease Brother, Father.    Vitals Fay Records CMA; 10/22/2014 9:21 AM) 10/22/2014 9:20 AM Weight: 194 lb Height: 67in Body Surface Area: 2.04 m Body Mass Index: 30.38 kg/m Temp.: 97.74F(Temporal)  Pulse: 68 (Regular)  BP: 128/70 (Sitting, Left Arm, Standard)     Physical Exam Romie Levee MD; 10/22/2014 12:53 PM)  General Mental Status-Alert. General Appearance-Consistent with stated age. Hydration-Well hydrated. Voice-Normal.  Head and Neck Head-normocephalic, atraumatic with no lesions or palpable masses. Trachea-midline. Thyroid Gland Characteristics - normal size and consistency.  Eye Eyeball - Bilateral-Extraocular movements intact. Sclera/Conjunctiva - Bilateral-No scleral icterus.  Chest and Lung Exam Chest and lung exam reveals -quiet, even and easy respiratory effort with no use of accessory muscles and  on auscultation, normal breath sounds, no adventitious sounds and normal vocal resonance. Inspection Chest Wall - Normal. Back - normal.  Cardiovascular Cardiovascular examination reveals -normal heart sounds, regular rate and  rhythm with no murmurs and normal pedal pulses bilaterally.  Abdomen Inspection Inspection of the abdomen reveals - No Hernias. Palpation/Percussion Palpation and Percussion of the abdomen reveal - Soft, Non Tender, No Rebound tenderness, No Rigidity (guarding) and No hepatosplenomegaly. Auscultation Auscultation of the abdomen reveals - Bowel sounds normal.  Rectal Anorectal Exam Internal - Note: Mass noted at the level of the prostate anteriorly, no direct sphincter invasion noted. Significantly close to the anal canal. Fixed to underlying tissues.  Neurologic Neurologic evaluation reveals -alert and oriented x 3 with no impairment of recent or remote memory. Mental Status-Normal.  Musculoskeletal Global Assessment -Note:no gross deformities.  Normal Exam - Left-Upper Extremity Strength Normal and Lower Extremity Strength Normal. Normal Exam - Right-Upper Extremity Strength Normal and Lower Extremity Strength Normal.    Assessment & Plan Romie Levee MD; 10/22/2014 9:51 AM)  PRIMARY CANCER OF RECTUM (C20) Impression: 81 year old male with a newly diagnosed anterior distal rectal cancer. Patient has a history of seed radiation for prostate cancer. The seeds are directly over the rectal cancer. There appears to be a plane between the 2 on ultrasound. We have discussed this in multidisciplinary GI tumor Board. It was recommended that he not undergo neoadjuvant chemoradiation due to his previous radiation to the prostate. The lung lesions and renal lesions were discussed as well. It was recommended that these be followed up after surgical resection. Given the nature of the tumor (sphincter involvement) and his age, I have recommended an abdominal  perineal resection to obtain the best surgical margin and oncological outcome. I have discussed this with my urology colleagues. We will plan on doing the case together as I will need assistance dissecting out the prostate. The patient is scheduled to see Dr. Berneice Heinrich later next week.

## 2014-11-04 ENCOUNTER — Other Ambulatory Visit: Payer: Self-pay | Admitting: Urology

## 2014-11-12 ENCOUNTER — Ambulatory Visit (HOSPITAL_COMMUNITY): Payer: Medicare HMO | Admitting: Hematology & Oncology

## 2014-11-12 ENCOUNTER — Encounter: Payer: Self-pay | Admitting: *Deleted

## 2014-11-12 NOTE — Progress Notes (Signed)
[place] Clinical Social Work  Clinical Social Work was referred by patient navigator for assessment of psychosocial needs due to questions re care for pt after surgery. Clinical Social Worker contacted daughter Tammie via phone and explained SNF process, skilled needs that would qualify pt for SNF stay after surgery. Pt currently independent with all ADLs per daughter. CSW discussed support available to learn ostomy care, but SNF stay may not be warranted due to high level of functioning currently. CSW suggested daughter discuss needs with surgeon prior to surgery. She reports they have, but pt can't stay alone. CSW problem solved with daughter that pt may need to stay with family for a bit while recovering, but may not need SNF just to have a caregiver during the day. CSW discussed that [facility] can provide wound as well. Daughter appreciated call and will reach out to CSW if more questions arise.     Clinical Social Work interventions: SNF process education  Loren Racer, [place] Tuesdays   [phone]

## 2014-12-05 ENCOUNTER — Encounter (HOSPITAL_COMMUNITY): Payer: Self-pay

## 2014-12-05 ENCOUNTER — Ambulatory Visit (HOSPITAL_COMMUNITY)
Admission: RE | Admit: 2014-12-05 | Discharge: 2014-12-05 | Disposition: A | Discharge status: Home / Self Care | Payer: Medicare HMO | Source: Ambulatory Visit | Attending: Anesthesiology | Admitting: Anesthesiology

## 2014-12-05 ENCOUNTER — Encounter (HOSPITAL_COMMUNITY)
Admission: RE | Admit: 2014-12-05 | Discharge: 2014-12-05 | Disposition: A | Discharge status: Home / Self Care | Payer: Medicare HMO | Source: Ambulatory Visit | Attending: General Surgery | Admitting: General Surgery

## 2014-12-05 DIAGNOSIS — I10 Essential (primary) hypertension: Secondary | ICD-10-CM | POA: Diagnosis not present

## 2014-12-05 DIAGNOSIS — Z01812 Encounter for preprocedural laboratory examination: Secondary | ICD-10-CM | POA: Insufficient documentation

## 2014-12-05 DIAGNOSIS — Z01818 Encounter for other preprocedural examination: Secondary | ICD-10-CM | POA: Diagnosis present

## 2014-12-05 DIAGNOSIS — R9389 Abnormal findings on diagnostic imaging of other specified body structures: Secondary | ICD-10-CM

## 2014-12-05 DIAGNOSIS — Z8546 Personal history of malignant neoplasm of prostate: Secondary | ICD-10-CM | POA: Diagnosis not present

## 2014-12-05 DIAGNOSIS — R918 Other nonspecific abnormal finding of lung field: Secondary | ICD-10-CM | POA: Insufficient documentation

## 2014-12-05 HISTORY — DX: Personal history of other diseases of the digestive system: Z87.19

## 2014-12-05 HISTORY — DX: Tinnitus, unspecified ear: H93.19

## 2014-12-05 LAB — CBC
HEMATOCRIT: 47.2 % (ref 39.0–52.0)
HEMOGLOBIN: 15.6 g/dL (ref 13.0–17.0)
MCH: 32 pg (ref 26.0–34.0)
MCHC: 33.1 g/dL (ref 30.0–36.0)
MCV: 96.9 fL (ref 78.0–100.0)
Platelets: 202 10*3/uL (ref 150–400)
RBC: 4.87 MIL/uL (ref 4.22–5.81)
RDW: 12.9 % (ref 11.5–15.5)
WBC: 8.8 10*3/uL (ref 4.0–10.5)

## 2014-12-05 LAB — BASIC METABOLIC PANEL
Anion gap: 7 (ref 5–15)
BUN: 14 mg/dL (ref 6–20)
CALCIUM: 9.6 mg/dL (ref 8.9–10.3)
CHLORIDE: 101 mmol/L (ref 101–111)
CO2: 29 mmol/L (ref 22–32)
CREATININE: 1.12 mg/dL (ref 0.61–1.24)
GFR calc non Af Amer: 60 mL/min — ABNORMAL LOW (ref >60)
Glucose, Bld: 139 mg/dL — ABNORMAL HIGH (ref 65–99)
Potassium: 4.2 mmol/L (ref 3.5–5.1)
Sodium: 137 mmol/L (ref 135–145)

## 2014-12-05 LAB — ABO/RH: ABO/RH(D): A POS

## 2014-12-05 NOTE — Patient Instructions (Addendum)
DANIEL TIENKEN  12/05/2014   Your procedure is scheduled on: Wednesday December 11, 2014   Report to [facility] Main  Entrance take [place]  elevators to 3rd floor to  [place] at 5:15 AM.  Call this number if you have problems the morning of surgery [phone]   Remember: ONLY 1 PERSON MAY GO WITH YOU TO SHORT STAY TO GET  READY MORNING OF [place].  Do not eat food or drink liquids :After Midnight.     Take these medicines the morning of surgery with A SIP OF WATER: Metoprolol; Omeprazole (Prilosec)               STOP ASPIRIN AND ALL HERBAL MEDICATIONS 5 DAYS PRIOR TO SURGERY                 You may not have any metal on your body including hair pins and              piercings  Do not wear jewelry,  lotions, powders or colognes, deodorant                           Men may shave face and neck.   Do not bring valuables to the hospital. [place].  Contacts, dentures or bridgework may not be worn into surgery.  Leave suitcase in the car. After surgery it may be brought to your room.    Special Instructions: FOLLOW SURGEON'S INSTRUCTION IN REGARDS TO BOWEL PREPARATION PRIOR TO SURGICAL DATE             _____________________________________________________________________             [facility] - Preparing for Surgery Before surgery, you can play an important role.  Because skin is not sterile, your skin needs to be as free of germs as possible.  You can reduce the number of germs on your skin by washing with CHG (chlorahexidine gluconate) soap before surgery.  CHG is an antiseptic cleaner which kills germs and bonds with the skin to continue killing germs even after washing. Please DO NOT use if you have an allergy to CHG or antibacterial soaps.  If your skin becomes reddened/irritated stop using the CHG and inform your nurse when you arrive at Short Stay. Do not shave (including legs and  underarms) for at least 48 hours prior to the first CHG shower.  You may shave your face/neck. Please follow these instructions carefully:  1.  Shower with CHG Soap the night before surgery and the  morning of Surgery.  2.  If you choose to wash your hair, wash your hair first as usual with your  normal  shampoo.  3.  After you shampoo, rinse your hair and body thoroughly to remove the  shampoo.                           4.  Use CHG as you would any other liquid soap.  You can apply chg directly  to the skin and wash                       Gently with a scrungie or clean washcloth.  5.  Apply the CHG Soap to your body ONLY FROM THE NECK DOWN.   Do not use on face/ open                           Wound or open sores. Avoid contact with eyes, ears mouth and genitals (private parts).                       Wash face,  Genitals (private parts) with your normal soap.             6.  Wash thoroughly, paying special attention to the area where your surgery  will be performed.  7.  Thoroughly rinse your body with warm water from the neck down.  8.  DO NOT shower/wash with your normal soap after using and rinsing off  the CHG Soap.                9.  Pat yourself dry with a clean towel.            10.  Wear clean pajamas.            11.  Place clean sheets on your bed the night of your first shower and do not  sleep with pets. Day of Surgery : Do not apply any lotions/deodorants the morning of surgery.  Please wear clean clothes to the hospital/surgery center.  FAILURE TO FOLLOW THESE INSTRUCTIONS MAY RESULT IN THE CANCELLATION OF YOUR SURGERY PATIENT SIGNATURE_________________________________  NURSE SIGNATURE__________________________________  ________________________________________________________________________

## 2014-12-05 NOTE — Consult Note (Addendum)
WOC ostomy consult note Patient seen per Dr. Marcello Moores' and Dr. Zettie Pho request for preoperative stoma site selection.  Lengthy session to cover A&P, stoma characteristics, pouch characteristics. Patient will have a LLQ colostomy and perhaps a conduit for urinary diversion. May have a colon conduit per notes. 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 Patient is hopeful that only one stoma will be created intraoperatively. Abdomen is assessed in the standing and sitting positions.  No creases or wrinkles are evident in the slightly rotund, soft abdomen. Patient wears his pants and undershorts low on the abdomen. RLQ site is located [id number] below the umbilicus and 7cm to the right. LLQ site is located 1cm below the umbilicus and [id number] to the left. Patients abdomen is cleansed with CHG wipes x2 and the mark is made with a surgical site marking pen and allowed to dry.  It is covered with a thin film transparent dressing. [place] Nurses will assist with post operative management and fitting of the ostomy pouching systems next week. [place] nursing team will remain available to this patient, the nursing and medical teams.  Please re-consult post op. Thanks, Maudie Flakes, MSN, RN, [place], Arther Abbott  Pager# [phone]

## 2014-12-06 LAB — HEMOGLOBIN A1C
Hgb A1c MFr Bld: 5.8 % — ABNORMAL HIGH (ref 4.8–5.6)
Mean Plasma Glucose: 120 mg/dL

## 2014-12-06 NOTE — Progress Notes (Signed)
A1C results per epic per PAT visit 12/05/2014 sent to Dr Joyice Faster

## 2014-12-10 NOTE — Anesthesia Preprocedure Evaluation (Addendum)
Anesthesia Evaluation  Patient identified by MRN, date of birth, ID band Patient awake    Reviewed: Allergy & Precautions, H&P , NPO status , Patient's Chart, lab work & pertinent test results  Airway Mallampati: II  TM Distance: >3 FB Neck ROM: full    Dental  (+) Dental Advisory Given, Chipped, Missing Right upper front chipped.  Missing lateral upper front on both sides.:   Pulmonary neg pulmonary ROS,    Pulmonary exam normal breath sounds clear to auscultation       Cardiovascular Exercise Tolerance: Good hypertension, Pt. on medications negative cardio ROS Normal cardiovascular exam Rhythm:regular Rate:Normal     Neuro/Psych negative neurological ROS  negative psych ROS   GI/Hepatic negative GI ROS, Neg liver ROS,   Endo/Other  negative endocrine ROS  Renal/GU negative Renal ROS  negative genitourinary   Musculoskeletal   Abdominal   Peds  Hematology negative hematology ROS (+)   Anesthesia Other Findings   Reproductive/Obstetrics negative OB ROS                           Anesthesia Physical Anesthesia Plan  ASA: II  Anesthesia Plan: General   Post-op Pain Management:    Induction: Intravenous  Airway Management Planned: Oral ETT  Additional Equipment: Arterial line  Intra-op Plan:   Post-operative Plan: Extubation in OR  Informed Consent: I have reviewed the patients History and Physical, chart, labs and discussed the procedure including the risks, benefits and alternatives for the proposed anesthesia with the patient or authorized representative who has indicated his/her understanding and acceptance.   Dental Advisory Given  Plan Discussed with: CRNA and Surgeon  Anesthesia Plan Comments:        Anesthesia Quick Evaluation

## 2014-12-11 ENCOUNTER — Inpatient Hospital Stay (HOSPITAL_COMMUNITY): Payer: Medicare HMO

## 2014-12-11 ENCOUNTER — Inpatient Hospital Stay (HOSPITAL_COMMUNITY)
Admission: RE | Admit: 2014-12-11 | Discharge: 2014-12-17 | DRG: 331 | Disposition: A | Discharge status: Skilled Nursing Facility | Payer: Medicare HMO | Source: Ambulatory Visit | Attending: General Surgery | Admitting: General Surgery

## 2014-12-11 ENCOUNTER — Inpatient Hospital Stay (HOSPITAL_COMMUNITY): Payer: Medicare HMO | Admitting: Certified Registered"

## 2014-12-11 ENCOUNTER — Encounter (HOSPITAL_COMMUNITY)
Admission: RE | Disposition: A | Discharge status: Skilled Nursing Facility | Payer: Self-pay | Source: Ambulatory Visit | Attending: General Surgery

## 2014-12-11 ENCOUNTER — Encounter (HOSPITAL_COMMUNITY): Payer: Self-pay | Admitting: *Deleted

## 2014-12-11 DIAGNOSIS — Z436 Encounter for attention to other artificial openings of urinary tract: Secondary | ICD-10-CM

## 2014-12-11 DIAGNOSIS — R918 Other nonspecific abnormal finding of lung field: Secondary | ICD-10-CM | POA: Diagnosis present

## 2014-12-11 DIAGNOSIS — Z8619 Personal history of other infectious and parasitic diseases: Secondary | ICD-10-CM

## 2014-12-11 DIAGNOSIS — E785 Hyperlipidemia, unspecified: Secondary | ICD-10-CM | POA: Diagnosis present

## 2014-12-11 DIAGNOSIS — C61 Malignant neoplasm of prostate: Secondary | ICD-10-CM | POA: Diagnosis present

## 2014-12-11 DIAGNOSIS — K219 Gastro-esophageal reflux disease without esophagitis: Secondary | ICD-10-CM | POA: Diagnosis present

## 2014-12-11 DIAGNOSIS — Z8249 Family history of ischemic heart disease and other diseases of the circulatory system: Secondary | ICD-10-CM

## 2014-12-11 DIAGNOSIS — K649 Unspecified hemorrhoids: Secondary | ICD-10-CM | POA: Diagnosis present

## 2014-12-11 DIAGNOSIS — Z7982 Long term (current) use of aspirin: Secondary | ICD-10-CM | POA: Diagnosis not present

## 2014-12-11 DIAGNOSIS — N281 Cyst of kidney, acquired: Secondary | ICD-10-CM | POA: Diagnosis present

## 2014-12-11 DIAGNOSIS — Z419 Encounter for procedure for purposes other than remedying health state, unspecified: Secondary | ICD-10-CM

## 2014-12-11 DIAGNOSIS — Z79899 Other long term (current) drug therapy: Secondary | ICD-10-CM | POA: Diagnosis not present

## 2014-12-11 DIAGNOSIS — I10 Essential (primary) hypertension: Secondary | ICD-10-CM | POA: Diagnosis present

## 2014-12-11 DIAGNOSIS — Z8042 Family history of malignant neoplasm of prostate: Secondary | ICD-10-CM | POA: Diagnosis not present

## 2014-12-11 DIAGNOSIS — Z8371 Family history of colonic polyps: Secondary | ICD-10-CM

## 2014-12-11 DIAGNOSIS — Z800 Family history of malignant neoplasm of digestive organs: Secondary | ICD-10-CM | POA: Diagnosis not present

## 2014-12-11 DIAGNOSIS — Z823 Family history of stroke: Secondary | ICD-10-CM

## 2014-12-11 DIAGNOSIS — Z833 Family history of diabetes mellitus: Secondary | ICD-10-CM | POA: Diagnosis not present

## 2014-12-11 DIAGNOSIS — Z86010 Personal history of colonic polyps: Secondary | ICD-10-CM

## 2014-12-11 DIAGNOSIS — E7800 Pure hypercholesterolemia, unspecified: Secondary | ICD-10-CM | POA: Diagnosis present

## 2014-12-11 DIAGNOSIS — Z8546 Personal history of malignant neoplasm of prostate: Secondary | ICD-10-CM | POA: Diagnosis not present

## 2014-12-11 DIAGNOSIS — M1990 Unspecified osteoarthritis, unspecified site: Secondary | ICD-10-CM | POA: Diagnosis present

## 2014-12-11 DIAGNOSIS — C19 Malignant neoplasm of rectosigmoid junction: Secondary | ICD-10-CM | POA: Diagnosis present

## 2014-12-11 DIAGNOSIS — Z923 Personal history of irradiation: Secondary | ICD-10-CM

## 2014-12-11 DIAGNOSIS — C20 Malignant neoplasm of rectum: Secondary | ICD-10-CM | POA: Diagnosis present

## 2014-12-11 HISTORY — PX: XI ROBOTIC ASSISTED LOWER ANTERIOR RESECTION: SHX6558

## 2014-12-11 HISTORY — PX: CYSTO: SHX6284

## 2014-12-11 LAB — TYPE AND SCREEN
ABO/RH(D): A POS
Antibody Screen: NEGATIVE

## 2014-12-11 LAB — MRSA PCR SCREENING: MRSA by PCR: NEGATIVE

## 2014-12-11 SURGERY — RESECTION, RECTUM, LOW ANTERIOR, ROBOT-ASSISTED
Anesthesia: General

## 2014-12-11 MED ORDER — ALVIMOPAN 12 MG PO CAPS
12.0000 mg | ORAL_CAPSULE | Freq: Two times a day (BID) | ORAL | Status: DC
  Administered 2014-12-12 – 2014-12-15 (×7): 12 mg via ORAL
  Filled 2014-12-11 (×8): qty 1

## 2014-12-11 MED ORDER — LACTATED RINGERS IR SOLN
Status: DC | PRN
  Administered 2014-12-11: 1

## 2014-12-11 MED ORDER — SODIUM CHLORIDE 0.9 % IJ SOLN
INTRAMUSCULAR | Status: AC
  Filled 2014-12-11: qty 20

## 2014-12-11 MED ORDER — EPHEDRINE SULFATE 50 MG/ML IJ SOLN
INTRAMUSCULAR | Status: DC | PRN
  Administered 2014-12-11 (×5): 5 mg via INTRAVENOUS

## 2014-12-11 MED ORDER — ESMOLOL HCL 100 MG/10ML IV SOLN
INTRAVENOUS | Status: DC | PRN
  Administered 2014-12-11 (×2): 15 mg via INTRAVENOUS

## 2014-12-11 MED ORDER — HYDROCHLOROTHIAZIDE 25 MG PO TABS
12.5000 mg | ORAL_TABLET | Freq: Every day | ORAL | Status: DC
  Administered 2014-12-12 – 2014-12-17 (×5): 12.5 mg via ORAL
  Filled 2014-12-11 (×5): qty 1

## 2014-12-11 MED ORDER — FENTANYL CITRATE (PF) 100 MCG/2ML IJ SOLN
INTRAMUSCULAR | Status: DC | PRN
  Administered 2014-12-11 (×3): 50 ug via INTRAVENOUS
  Administered 2014-12-11: 25 ug via INTRAVENOUS
  Administered 2014-12-11 (×5): 50 ug via INTRAVENOUS
  Administered 2014-12-11: 75 ug via INTRAVENOUS

## 2014-12-11 MED ORDER — HEPARIN SODIUM (PORCINE) 5000 UNIT/ML IJ SOLN
5000.0000 [IU] | Freq: Once | INTRAMUSCULAR | Status: AC
  Administered 2014-12-11: 5000 [IU] via SUBCUTANEOUS
  Filled 2014-12-11: qty 1

## 2014-12-11 MED ORDER — MIDAZOLAM HCL 2 MG/2ML IJ SOLN
INTRAMUSCULAR | Status: AC
  Filled 2014-12-11: qty 2

## 2014-12-11 MED ORDER — SODIUM CHLORIDE 0.9 % IJ SOLN
INTRAMUSCULAR | Status: DC | PRN
  Administered 2014-12-11: 20 mL

## 2014-12-11 MED ORDER — SUGAMMADEX SODIUM 500 MG/5ML IV SOLN
INTRAVENOUS | Status: AC
Start: 2014-12-11 — End: 2014-12-11
  Filled 2014-12-11: qty 5

## 2014-12-11 MED ORDER — DEXTROSE 5 % IV SOLN
2.0000 g | INTRAVENOUS | Status: AC
  Administered 2014-12-11 (×2): 2 g via INTRAVENOUS

## 2014-12-11 MED ORDER — DIPHENHYDRAMINE HCL 12.5 MG/5ML PO ELIX: 12.5000 mg | ORAL_SOLUTION | Freq: Four times a day (QID) | ORAL | Status: DC | PRN

## 2014-12-11 MED ORDER — FENTANYL CITRATE (PF) 250 MCG/5ML IJ SOLN
INTRAMUSCULAR | Status: AC
  Filled 2014-12-11: qty 5

## 2014-12-11 MED ORDER — DEXTROSE 5 % IV SOLN
2.0000 g | Freq: Two times a day (BID) | INTRAVENOUS | Status: AC
  Administered 2014-12-11: 2 g via INTRAVENOUS
  Filled 2014-12-11: qty 2

## 2014-12-11 MED ORDER — STERILE WATER FOR IRRIGATION IR SOLN
Status: DC | PRN
  Administered 2014-12-11: 1000 mL

## 2014-12-11 MED ORDER — CEFOTETAN DISODIUM-DEXTROSE 2-2.08 GM-% IV SOLR
INTRAVENOUS | Status: AC
  Filled 2014-12-11: qty 50

## 2014-12-11 MED ORDER — METOPROLOL SUCCINATE ER 25 MG PO TB24
25.0000 mg | ORAL_TABLET | Freq: Two times a day (BID) | ORAL | Status: DC
  Administered 2014-12-11 – 2014-12-17 (×11): 25 mg via ORAL
  Filled 2014-12-11 (×11): qty 1

## 2014-12-11 MED ORDER — ACETAMINOPHEN 500 MG PO TABS
1000.0000 mg | ORAL_TABLET | Freq: Four times a day (QID) | ORAL | Status: AC
  Administered 2014-12-11 – 2014-12-12 (×3): 1000 mg via ORAL
  Filled 2014-12-11 (×3): qty 2

## 2014-12-11 MED ORDER — ONDANSETRON HCL 4 MG PO TABS: 4.0000 mg | ORAL_TABLET | Freq: Four times a day (QID) | ORAL | Status: DC | PRN

## 2014-12-11 MED ORDER — BUPIVACAINE LIPOSOME 1.3 % IJ SUSP
20.0000 mL | Freq: Once | INTRAMUSCULAR | Status: AC
  Administered 2014-12-11: 20 mL
  Filled 2014-12-11: qty 20

## 2014-12-11 MED ORDER — ROCURONIUM BROMIDE 100 MG/10ML IV SOLN
INTRAVENOUS | Status: DC | PRN
  Administered 2014-12-11: 20 mg via INTRAVENOUS
  Administered 2014-12-11: 10 mg via INTRAVENOUS
  Administered 2014-12-11: 20 mg via INTRAVENOUS
  Administered 2014-12-11: 60 mg via INTRAVENOUS
  Administered 2014-12-11 (×4): 20 mg via INTRAVENOUS
  Administered 2014-12-11: 10 mg via INTRAVENOUS
  Administered 2014-12-11: 20 mg via INTRAVENOUS

## 2014-12-11 MED ORDER — 0.9 % SODIUM CHLORIDE (POUR BTL) OPTIME
TOPICAL | Status: DC | PRN
  Administered 2014-12-11: 1000 mL

## 2014-12-11 MED ORDER — LIDOCAINE HCL (CARDIAC) 20 MG/ML IV SOLN
INTRAVENOUS | Status: DC | PRN
  Administered 2014-12-11: 80 mg via INTRAVENOUS

## 2014-12-11 MED ORDER — ONDANSETRON HCL 4 MG/2ML IJ SOLN
INTRAMUSCULAR | Status: AC
  Filled 2014-12-11: qty 2

## 2014-12-11 MED ORDER — LOSARTAN POTASSIUM 50 MG PO TABS
50.0000 mg | ORAL_TABLET | Freq: Every day | ORAL | Status: DC
  Administered 2014-12-12 – 2014-12-17 (×5): 50 mg via ORAL
  Filled 2014-12-11 (×7): qty 1

## 2014-12-11 MED ORDER — ROCURONIUM BROMIDE 100 MG/10ML IV SOLN
INTRAVENOUS | Status: AC
  Filled 2014-12-11: qty 1

## 2014-12-11 MED ORDER — HYDROMORPHONE HCL 1 MG/ML IJ SOLN
INTRAMUSCULAR | Status: AC
  Filled 2014-12-11: qty 1

## 2014-12-11 MED ORDER — EPHEDRINE SULFATE 50 MG/ML IJ SOLN
INTRAMUSCULAR | Status: AC
  Filled 2014-12-11: qty 1

## 2014-12-11 MED ORDER — ENOXAPARIN SODIUM 40 MG/0.4ML ~~LOC~~ SOLN
40.0000 mg | SUBCUTANEOUS | Status: DC
  Administered 2014-12-12 – 2014-12-16 (×5): 40 mg via SUBCUTANEOUS
  Filled 2014-12-11 (×5): qty 0.4

## 2014-12-11 MED ORDER — LIDOCAINE HCL (CARDIAC) 20 MG/ML IV SOLN
INTRAVENOUS | Status: AC
Start: 2014-12-11 — End: 2014-12-11
  Filled 2014-12-11: qty 5

## 2014-12-11 MED ORDER — SODIUM CHLORIDE 0.9 % IJ SOLN
INTRAMUSCULAR | Status: AC
  Filled 2014-12-11: qty 10

## 2014-12-11 MED ORDER — HYDROMORPHONE HCL 1 MG/ML IJ SOLN
0.2500 mg | INTRAMUSCULAR | Status: DC | PRN
  Administered 2014-12-11 (×2): 0.5 mg via INTRAVENOUS

## 2014-12-11 MED ORDER — LIP MEDEX EX OINT
TOPICAL_OINTMENT | CUTANEOUS | Status: AC
  Administered 2014-12-11: 18:00:00
  Filled 2014-12-11: qty 7

## 2014-12-11 MED ORDER — BUPIVACAINE-EPINEPHRINE 0.25% -1:200000 IJ SOLN
INTRAMUSCULAR | Status: AC
  Filled 2014-12-11: qty 1

## 2014-12-11 MED ORDER — MIDAZOLAM HCL 5 MG/5ML IJ SOLN
INTRAMUSCULAR | Status: DC | PRN
  Administered 2014-12-11 (×2): 1 mg via INTRAVENOUS

## 2014-12-11 MED ORDER — ONDANSETRON HCL 4 MG/2ML IJ SOLN
4.0000 mg | Freq: Four times a day (QID) | INTRAMUSCULAR | Status: DC | PRN
  Administered 2014-12-13: 4 mg via INTRAVENOUS
  Filled 2014-12-11 (×2): qty 2

## 2014-12-11 MED ORDER — CETYLPYRIDINIUM CHLORIDE 0.05 % MT LIQD
7.0000 mL | Freq: Two times a day (BID) | OROMUCOSAL | Status: DC
  Administered 2014-12-11 – 2014-12-16 (×10): 7 mL via OROMUCOSAL

## 2014-12-11 MED ORDER — PROPOFOL 10 MG/ML IV BOLUS
INTRAVENOUS | Status: DC | PRN
  Administered 2014-12-11: 150 mg via INTRAVENOUS

## 2014-12-11 MED ORDER — PROPOFOL 10 MG/ML IV BOLUS
INTRAVENOUS | Status: AC
  Filled 2014-12-11: qty 20

## 2014-12-11 MED ORDER — ONDANSETRON HCL 4 MG/2ML IJ SOLN
INTRAMUSCULAR | Status: DC | PRN
  Administered 2014-12-11: 4 mg via INTRAVENOUS

## 2014-12-11 MED ORDER — BUPIVACAINE-EPINEPHRINE 0.25% -1:200000 IJ SOLN
INTRAMUSCULAR | Status: DC | PRN
  Administered 2014-12-11: 18 mL

## 2014-12-11 MED ORDER — PANTOPRAZOLE SODIUM 40 MG PO TBEC
40.0000 mg | DELAYED_RELEASE_TABLET | Freq: Every day | ORAL | Status: DC
  Administered 2014-12-12 – 2014-12-17 (×6): 40 mg via ORAL
  Filled 2014-12-11 (×6): qty 1

## 2014-12-11 MED ORDER — ALVIMOPAN 12 MG PO CAPS
12.0000 mg | ORAL_CAPSULE | Freq: Once | ORAL | Status: AC
  Administered 2014-12-11: 12 mg via ORAL
  Filled 2014-12-11: qty 1

## 2014-12-11 MED ORDER — LACTATED RINGERS IV SOLN: INTRAVENOUS | Status: DC

## 2014-12-11 MED ORDER — POTASSIUM CHLORIDE IN NACL 20-0.9 MEQ/L-% IV SOLN
INTRAVENOUS | Status: DC
  Administered 2014-12-11 – 2014-12-15 (×8): via INTRAVENOUS
  Filled 2014-12-11 (×10): qty 1000

## 2014-12-11 MED ORDER — DIPHENHYDRAMINE HCL 50 MG/ML IJ SOLN: 12.5000 mg | Freq: Four times a day (QID) | INTRAMUSCULAR | Status: DC | PRN

## 2014-12-11 MED ORDER — IOHEXOL 300 MG/ML  SOLN
INTRAMUSCULAR | Status: DC | PRN
  Administered 2014-12-11: 13 mL

## 2014-12-11 MED ORDER — LACTATED RINGERS IV SOLN
INTRAVENOUS | Status: DC | PRN
  Administered 2014-12-11 (×2): via INTRAVENOUS

## 2014-12-11 MED ORDER — LIDOCAINE HCL (CARDIAC) 20 MG/ML IV SOLN
INTRAVENOUS | Status: AC
  Filled 2014-12-11: qty 5

## 2014-12-11 MED ORDER — LACTATED RINGERS IV SOLN
INTRAVENOUS | Status: DC | PRN
  Administered 2014-12-11 (×2): via INTRAVENOUS

## 2014-12-11 MED ORDER — SUGAMMADEX SODIUM 500 MG/5ML IV SOLN
INTRAVENOUS | Status: DC | PRN
  Administered 2014-12-11: 400 mg via INTRAVENOUS

## 2014-12-11 MED ORDER — MORPHINE SULFATE (PF) 2 MG/ML IV SOLN
2.0000 mg | INTRAVENOUS | Status: DC | PRN
  Administered 2014-12-11: 4 mg via INTRAVENOUS
  Administered 2014-12-11: 2 mg via INTRAVENOUS
  Administered 2014-12-12: 4 mg via INTRAVENOUS
  Administered 2014-12-12: 2 mg via INTRAVENOUS
  Administered 2014-12-12 (×2): 4 mg via INTRAVENOUS
  Administered 2014-12-13: 2 mg via INTRAVENOUS
  Filled 2014-12-11 (×3): qty 2
  Filled 2014-12-11 (×2): qty 1
  Filled 2014-12-11: qty 2
  Filled 2014-12-11: qty 1

## 2014-12-11 SURGICAL SUPPLY — 122 items
BAG URO CATCHER STRL LF (DRAPE) ×3 IMPLANT
BLADE EXTENDED COATED 6.5IN (ELECTRODE) ×6 IMPLANT
CANNULA REDUC XI 12-8 STAPL (CANNULA) ×1
CANNULA REDUC XI 12-8MM STAPL (CANNULA) ×1
CANNULA REDUCER 12-8 DVNC XI (CANNULA) ×1 IMPLANT
CATH INTERMIT  6FR 70CM (CATHETERS) ×6 IMPLANT
CELLS DAT CNTRL 66122 CELL SVR (MISCELLANEOUS) IMPLANT
CLIP LIGATING HEM O LOK PURPLE (MISCELLANEOUS) ×12 IMPLANT
CLIP LIGATING HEMO LOK XL GOLD (MISCELLANEOUS) ×3 IMPLANT
CLIP LIGATING HEMOLOK MED (MISCELLANEOUS) IMPLANT
CLOTH BEACON ORANGE TIMEOUT ST (SAFETY) ×3 IMPLANT
COUNTER NEEDLE 20 DBL MAG RED (NEEDLE) ×3 IMPLANT
COVER MAYO STAND STRL (DRAPES) ×6 IMPLANT
COVER SURGICAL LIGHT HANDLE (MISCELLANEOUS) ×3 IMPLANT
COVER TIP SHEARS 8 DVNC (MISCELLANEOUS) ×2 IMPLANT
COVER TIP SHEARS 8MM DA VINCI (MISCELLANEOUS) ×4
DECANTER SPIKE VIAL GLASS SM (MISCELLANEOUS) ×3 IMPLANT
DEVICE TROCAR PUNCTURE CLOSURE (ENDOMECHANICALS) IMPLANT
DRAIN CHANNEL 19F RND (DRAIN) ×3 IMPLANT
DRAPE ARM DVNC X/XI (DISPOSABLE) ×8 IMPLANT
DRAPE COLUMN DVNC XI (DISPOSABLE) ×2 IMPLANT
DRAPE DA VINCI XI ARM (DISPOSABLE) ×16
DRAPE DA VINCI XI COLUMN (DISPOSABLE) ×4
DRAPE SURG IRRIG POUCH 19X23 (DRAPES) ×3 IMPLANT
DRSG OPSITE POSTOP 4X10 (GAUZE/BANDAGES/DRESSINGS) IMPLANT
DRSG OPSITE POSTOP 4X6 (GAUZE/BANDAGES/DRESSINGS) ×3 IMPLANT
DRSG OPSITE POSTOP 4X8 (GAUZE/BANDAGES/DRESSINGS) IMPLANT
ELECT PENCIL ROCKER SW 15FT (MISCELLANEOUS) ×9 IMPLANT
ELECT REM PT RETURN 15FT ADLT (MISCELLANEOUS) ×3 IMPLANT
ENDOLOOP SUT PDS II  0 18 (SUTURE)
ENDOLOOP SUT PDS II 0 18 (SUTURE) IMPLANT
EVACUATOR SILICONE 100CC (DRAIN) ×3 IMPLANT
GAUZE SPONGE 4X4 12PLY STRL (GAUZE/BANDAGES/DRESSINGS) IMPLANT
GLOVE BIO SURGEON STRL SZ 6.5 (GLOVE) ×6 IMPLANT
GLOVE BIO SURGEONS STRL SZ 6.5 (GLOVE) ×3
GLOVE BIOGEL M STRL SZ7.5 (GLOVE) ×3 IMPLANT
GLOVE BIOGEL PI IND STRL 7.0 (GLOVE) ×3 IMPLANT
GLOVE BIOGEL PI INDICATOR 7.0 (GLOVE) ×6
GOWN STRL REUS W/TWL 2XL LVL3 (GOWN DISPOSABLE) ×9 IMPLANT
GOWN STRL REUS W/TWL LRG LVL3 (GOWN DISPOSABLE) ×6 IMPLANT
GOWN STRL REUS W/TWL XL LVL3 (GOWN DISPOSABLE) ×12 IMPLANT
HOLDER FOLEY CATH W/STRAP (MISCELLANEOUS) ×3 IMPLANT
LEGGING LITHOTOMY PAIR STRL (DRAPES) ×3 IMPLANT
LIQUID BAND (GAUZE/BANDAGES/DRESSINGS) ×3 IMPLANT
MANIFOLD NEPTUNE II (INSTRUMENTS) ×3 IMPLANT
NEEDLE INSUFFLATION 14GA 120MM (NEEDLE) ×3 IMPLANT
PACK CARDIOVASCULAR III (CUSTOM PROCEDURE TRAY) ×3 IMPLANT
PACK COLON (CUSTOM PROCEDURE TRAY) ×3 IMPLANT
PACK CYSTO (CUSTOM PROCEDURE TRAY) ×3 IMPLANT
PAD ABD 8X10 STRL (GAUZE/BANDAGES/DRESSINGS) ×3 IMPLANT
PEN SKIN MARKING BROAD (MISCELLANEOUS) ×3 IMPLANT
PORT LAP GEL ALEXIS MED 5-9CM (MISCELLANEOUS) ×3 IMPLANT
RELOAD STAPLER BLUE 60MM (STAPLE) ×2 IMPLANT
RELOAD STAPLER WHITE 60MM (STAPLE) ×6 IMPLANT
RETRACTOR LONE STAR DISPOSABLE (INSTRUMENTS) ×3 IMPLANT
RETRACTOR STAY HOOK 5MM (MISCELLANEOUS) ×3 IMPLANT
RTRCTR WOUND ALEXIS 18CM MED (MISCELLANEOUS)
SCISSORS LAP 5X35 DISP (ENDOMECHANICALS) ×3 IMPLANT
SEAL CANN UNIV 5-8 DVNC XI (MISCELLANEOUS) ×4 IMPLANT
SEAL XI 5MM-8MM UNIVERSAL (MISCELLANEOUS) ×8
SEALER VESSEL DA VINCI XI (MISCELLANEOUS) ×2
SEALER VESSEL EXT DVNC XI (MISCELLANEOUS) ×1 IMPLANT
SET IRRIG TUBING LAPAROSCOPIC (IRRIGATION / IRRIGATOR) ×3 IMPLANT
SLEEVE XCEL OPT CAN 5 100 (ENDOMECHANICALS) ×3 IMPLANT
SOLUTION ELECTROLUBE (MISCELLANEOUS) ×3 IMPLANT
SPONGE LAP 4X18 X RAY DECT (DISPOSABLE) ×3 IMPLANT
STAPLER 45 BLU RELOAD XI (STAPLE) IMPLANT
STAPLER 45 BLUE RELOAD XI (STAPLE)
STAPLER 45 GREEN RELOAD XI (STAPLE)
STAPLER 45 GRN RELOAD XI (STAPLE) IMPLANT
STAPLER CANNULA SEAL DVNC XI (STAPLE) ×1 IMPLANT
STAPLER CANNULA SEAL XI (STAPLE) ×2
STAPLER ECHELON LONG 60 440 (INSTRUMENTS) ×3 IMPLANT
STAPLER RELOAD BLUE 60MM (STAPLE) ×6
STAPLER RELOAD WHITE 60MM (STAPLE) ×18
STAPLER SHEATH (SHEATH) ×2
STAPLER SHEATH ENDOWRIST DVNC (SHEATH) ×1 IMPLANT
STAPLER VISISTAT 35W (STAPLE) ×3 IMPLANT
STENT SET URETHERAL LEFT 7FR (STENTS) ×3 IMPLANT
STENT SET URETHERAL RIGHT 7FR (STENTS) ×3 IMPLANT
SUT CHROMIC 2 0 SH (SUTURE) ×3 IMPLANT
SUT CHROMIC 4 0 RB 1X27 (SUTURE) ×3 IMPLANT
SUT DVC VLOC 180 2-0 12IN GS21 (SUTURE) ×3
SUT MNCRL AB 4-0 PS2 18 (SUTURE) ×3 IMPLANT
SUT PDS AB 1 CTX 36 (SUTURE) ×9 IMPLANT
SUT PDS AB 1 TP1 96 (SUTURE) IMPLANT
SUT PROLENE 2 0 KS (SUTURE) ×3 IMPLANT
SUT SILK 2 0 (SUTURE) ×2
SUT SILK 2 0 SH (SUTURE) ×3 IMPLANT
SUT SILK 2 0 SH CR/8 (SUTURE) ×3 IMPLANT
SUT SILK 2-0 18XBRD TIE 12 (SUTURE) ×1 IMPLANT
SUT SILK 3 0 (SUTURE) ×2
SUT SILK 3 0 SH CR/8 (SUTURE) ×6 IMPLANT
SUT SILK 3-0 18XBRD TIE 12 (SUTURE) ×1 IMPLANT
SUT V-LOC BARB 180 2/0GR6 GS22 (SUTURE)
SUT VIC AB 2-0 SH 18 (SUTURE) ×12 IMPLANT
SUT VIC AB 2-0 SH 27 (SUTURE) ×10
SUT VIC AB 2-0 SH 27X BRD (SUTURE) ×5 IMPLANT
SUT VIC AB 2-0 UR6 27 (SUTURE) ×12 IMPLANT
SUT VIC AB 3-0 SH 18 (SUTURE) ×3 IMPLANT
SUT VIC AB 3-0 SH 27 (SUTURE) ×6
SUT VIC AB 3-0 SH 27X BRD (SUTURE) ×2 IMPLANT
SUT VIC AB 3-0 SH 27XBRD (SUTURE) ×1 IMPLANT
SUT VIC AB 4-0 PS2 27 (SUTURE) ×6 IMPLANT
SUT VIC AB 4-0 RB1 27 (SUTURE) ×8
SUT VIC AB 4-0 RB1 27XBRD (SUTURE) ×4 IMPLANT
SUTURE DVC VL 180 2-0 12INGS21 (SUTURE) ×1 IMPLANT
SUTURE V-LC BRB 180 2/0GR6GS22 (SUTURE) IMPLANT
SYRINGE 10CC LL (SYRINGE) ×3 IMPLANT
SYS LAPSCP GELPORT 120MM (MISCELLANEOUS)
SYSTEM LAPSCP GELPORT 120MM (MISCELLANEOUS) IMPLANT
SYSTEM UROSTOMY GENTLE TOUCH (WOUND CARE) ×3 IMPLANT
TOWEL OR 17X26 10 PK STRL BLUE (TOWEL DISPOSABLE) ×3 IMPLANT
TOWEL OR NON WOVEN STRL DISP B (DISPOSABLE) ×3 IMPLANT
TRAY FOLEY W/METER SILVER 14FR (SET/KITS/TRAYS/PACK) ×3 IMPLANT
TRAY FOLEY W/METER SILVER 16FR (SET/KITS/TRAYS/PACK) ×3 IMPLANT
TROCAR BLADELESS 15MM (ENDOMECHANICALS) ×3 IMPLANT
TROCAR BLADELESS OPT 5 100 (ENDOMECHANICALS) ×3 IMPLANT
TUBING CONNECTING 10 (TUBING) ×2 IMPLANT
TUBING CONNECTING 10' (TUBING) ×1
TUBING FILTER THERMOFLATOR (ELECTROSURGICAL) ×3 IMPLANT
URINEMETER 200ML W/220 (MISCELLANEOUS) ×3 IMPLANT

## 2014-12-11 NOTE — Progress Notes (Signed)
POST - OP CHECK:  S: POD 0 s/p APR + cystoprostatectomy with colon conduit and colostomy.  O: NAD Mild regular tachycardia by bedside monitor Colon conduit with tea colored urine and bilat bander stents in position. Colostomy pink.  JP with serosanguinous output (800 cc post-op) Port / extraction / perineal sites c/d/i. No c/c/e  A/P: Doing well POD 0 major surgery. Portable KUB to check bander stent position. WIll consider check JP Cr if output remains high, though suspect irrigation as 2L irriagation used intra-op.

## 2014-12-11 NOTE — Anesthesia Postprocedure Evaluation (Signed)
Anesthesia Post Note  Patient: CONCETTO SINGLETON  Procedure(s) Performed: Procedure(s) (LRB): XI ROBOTIC ASSISTED ABDOMINAL PERINEAL RESECTION OMENTOPLASTY (N/A)  CYSTO,  PROSTATECTOMY, CYSTECTOMY colon conduit, BILATERAL LYMPH NODE DISSECTION (N/A)  Patient location during evaluation: PACU Anesthesia Type: General Level of consciousness: awake and alert Pain management: pain level controlled Vital Signs Assessment: post-procedure vital signs reviewed and stable Respiratory status: spontaneous breathing, nonlabored ventilation, respiratory function stable and patient connected to nasal cannula oxygen Cardiovascular status: blood pressure returned to baseline and stable Postop Assessment: No signs of nausea or vomiting Anesthetic complications: no    Last Vitals:  Filed Vitals:   12/11/14 1700 12/11/14 1715  BP: 163/87 150/88  Pulse: 117 110  Temp:    Resp: 12 17    Last Pain:  Filed Vitals:   12/11/14 1719  PainSc: Asleep                 Journii Nierman L

## 2014-12-11 NOTE — H&P (Signed)
Ian Christensen is an 81 y.o. male.    Chief Complaint: Pre-op Recto-segmoid resection v. Total pelvic exenteration  HPI:   1 - Prostate Cancer - s/p radioactive seed primary therapy around 2000. PSA Nadir 0.35 2005. Slowly progressive uptrend with PSA most recently 6.09 this years. Most recent bone scan and CT w/o obvious metastasis. PSADT 2-3 years. Minimal baseline voiding complaints. No hematuria or incontinence. Overall very happy with current urinary function.   2 - Rectal Cancer - new stage 3 rectal cancer found on eval palpable mass by Dr. Jeffie Pollock and evaluated now by Dr. Carlean Purl and Marcello Moores. Dr. Marcello Moores recommends APR with end colostomy. CT with prostate seeds very near anterior rectal wall.   PMH sig for HTN, HLD, open appy. No CV disease. NO strong blood thinners. He is very independent and still lives alone and drives. Has older daughter that is very involved. His wife is in nursing home. He is retired Recruitment consultant.  Today " Ian Christensen" is seen to proceed with APR with possible pelvic exenteration.   Past Medical History  Diagnosis Date  . High cholesterol   . HTN (hypertension)   . Anal fissure   . Arthritis   . Colon polyps   . GERD (gastroesophageal reflux disease)   . Prostate cancer ([place])     in remission 2001. (seed implants)  . Rectal cancer ([place]) 10/07/2014  . Cataract     left eye   . Tinnitus   . Imbalance   . History of measles   . History of hiatal hernia     Past Surgical History  Procedure Laterality Date  . Appendectomy  1955  . Colonoscopy      removes 1 polp  . Eus N/A 10/10/2014    Procedure: LOWER ENDOSCOPIC ULTRASOUND (EUS);  Surgeon: Milus Banister, MD;  Location: [facility];  Service: Endoscopy;  Laterality: N/A;    Family History  Problem Relation Age of Onset  . Colon cancer Sister   . Prostate cancer Brother   . Diabetes Mother   . Heart disease Mother   . Heart disease Father   . Colon polyps Daughter     x 2   Social History:  reports  that he has never smoked. He has never used smokeless tobacco. He reports that he drinks alcohol. He reports that he does not use illicit drugs.  Allergies: No Known Allergies  Medications Prior to Admission  Medication Sig Dispense Refill  . ascorbic acid (VITAMIN C) 500 MG tablet Take 500 mg by mouth daily.    Marland Kitchen aspirin EC 81 MG tablet Take 162 mg by mouth at bedtime.     . hydrochlorothiazide (HYDRODIURIL) 25 MG tablet Take 12.5 mg by mouth daily.     Marland Kitchen losartan (COZAAR) 50 MG tablet Take 50 mg by mouth daily.    . metoprolol succinate (TOPROL-XL) 50 MG 24 hr tablet Take 25 mg by mouth 2 (two) times daily. Take with or immediately following a meal.    . niacin 500 MG CR capsule Take 500 mg by mouth at bedtime.    Marland Kitchen omeprazole (PRILOSEC) 20 MG capsule Take 1 capsule by mouth daily.    . simvastatin (ZOCOR) 20 MG tablet Take 20 mg by mouth every evening.    . vitamin B-12 (CYANOCOBALAMIN) 1000 MCG tablet Take 1,000 mcg by mouth daily.      No results found for this or any previous visit (from the past 48 hour(s)). No results found.  Review of Systems  Constitutional: Negative.   HENT: Negative.   Eyes: Negative.   Respiratory: Negative.   Cardiovascular: Negative.   Gastrointestinal: Negative.   Genitourinary: Negative.   Musculoskeletal: Negative.   Skin: Negative.   Neurological: Negative.   Endo/Heme/Allergies: Negative.   Psychiatric/Behavioral: Negative.     Blood pressure 194/92, pulse 72, temperature 97.5 F (36.4 C), temperature source Oral, resp. rate 18, height 5\' 7"  (1.702 m), weight 87.261 kg (192 lb 6 oz), SpO2 99 %. Physical Exam  Constitutional: He appears well-developed.  HENT:  Head: Normocephalic.  Eyes: Pupils are equal, round, and reactive to light.  Neck: Normal range of motion.  Cardiovascular: Normal rate.   Respiratory: Effort normal.  GI: Soft.  Stomal marking sites noted  Genitourinary:  No CVAT  Musculoskeletal: Normal range of motion.   Neurological: He is alert.  Skin: Skin is warm.  Psychiatric: He has a normal mood and affect. His behavior is normal. Judgment and thought content normal.     Assessment/Plan   1 - Prostate Cancer - likely active but very slow growing / indolent prostatic fossa disease. He has essentially no bother from voiding symptoms. We rediscussed the potential role of cystoprostatectomy + colon conduit with goal of maximum local control of prostate cancer at time of rectal cancer surgery but both agree that cystoprostatectomy should only be performed if unable to otherwise removed rectal cancer. I agree with this as he is still hormone naive and at his age is very unlikely to have sequelae from prostate cancer.  We will therefore plan to have Urol assist during gen surg APR.  We will proceed with concomitant cystoprostatectomy only if absolutely necessary as per pt wishes.   Rec cysto stent placement to help dilineate ureteral anatomy given radiation history.   2 - Rectal Cancer - plas as per above.  Aileen Amore 12/11/2014, 6:27 AM

## 2014-12-11 NOTE — Brief Op Note (Signed)
12/11/2014  4:48 PM  PATIENT:  Talbert Forest  81 y.o. male  PRE-OPERATIVE DIAGNOSIS:  rectal cancer  POST-OPERATIVE DIAGNOSIS:  rectal cancer  PROCEDURE:  Cystoscopy, BIlateral Retrogrades / STetns. Robotic Cystoprostatectomy with bilateral pelvic lymphadenectomy and colon conduit urinary diversion.  SURGEON:  Surgeon(s) and Role: Panel 1:    * Leighton Ruff, MD - Primary  Panel 2:    * Alexis Frock, MD - Primary  PHYSICIAN ASSISTANT:   ASSISTANTS: 1 - Jonita Albee MD   ANESTHESIA:   local and general  EBL:  Total I/O In: 66 [I.V.:3000] Out: 450 [Urine:200; Blood:250]  BLOOD ADMINISTERED:none  DRAINS: RLQ Colon conduit with Red (right) and Blue (left) bander stents, JP to bulb, LLQ colostomy   LOCAL MEDICATIONS USED:  MARCAINE     SPECIMEN:  Source of Specimen:  1 - bilateral pelvic lymph nodes (separate packets) 2- Bladder, prostate, rectum-sigmoig-anus en bloc  DISPOSITION OF SPECIMEN:  PATHOLOGY  COUNTS:  YES  TOURNIQUET:  * No tourniquets in log *  DICTATION: .Other Dictation: Dictation Number [id number]  PLAN OF CARE: Admit to inpatient   PATIENT DISPOSITION:  PACU - hemodynamically stable.   Delay start of Pharmacological VTE agent (>24hrs) due to surgical blood loss or risk of bleeding: no

## 2014-12-11 NOTE — Anesthesia Procedure Notes (Signed)
Procedure Name: Intubation Date/Time: 12/11/2014 7:45 AM Performed by: Glory Buff Pre-anesthesia Checklist: Patient identified, Emergency Drugs available, Suction available and Patient being monitored Patient Re-evaluated:Patient Re-evaluated prior to inductionOxygen Delivery Method: Circle System Utilized Preoxygenation: Pre-oxygenation with 100% oxygen Intubation Type: IV induction Ventilation: Mask ventilation without difficulty Laryngoscope Size: Miller and 3 Grade View: Grade II Tube type: Oral Tube size: 7.5 mm Number of attempts: 1 Airway Equipment and Method: Stylet and Oral airway Placement Confirmation: ETT inserted through vocal cords under direct vision,  positive ETCO2 and breath sounds checked- equal and bilateral Secured at: 21 cm Tube secured with: Tape Dental Injury: Teeth and Oropharynx as per pre-operative assessment

## 2014-12-11 NOTE — H&P (Signed)
History of Present Illness Ian Ruff MD; 123456 12:08 PM) The patient is a 81 year old male who presents with colorectal cancer. 81 year old male who presents to the office with a new diagnosis of rectal adenocarcinoma. A rectal mass was found during digital rectal exam by Dr. Roni Bread for his prostate cancer. The patient has undergone radiation seed therapy for prostate cancer. A colonoscopy revealed a large partially circumferential distal mass. Biopsy confirmed adenocarcinoma. Patient denies any rectal bleeding. He is having regular bowel movements. He underwent CT scans of the chest abdomen and pelvis. There were some concerning lung nodules and renal cysts noted, but all of these were small. US shows a T3 rectal lesion 1 cm from anal verge with N1b lymph nodes (Stage 3b). CEA was <0.5. We discussed his case in multidisciplinary GI tumor conference. He is unable to receive any further radiation due to his prostate seed implantation. It was recommended that we proceed with surgery and follow-up the renal lesions and lung lesions postoperatively.  Problem List/Past Medical Ian Ruff, MD; 123456 12:51 PM) PRIMARY CANCER OF RECTUM (C20)  Other Problems Ian Ruff, MD; 123456 12:51 PM) Other disease, cancer, significant illness Prostate Cancer Hypercholesterolemia Hemorrhoids High blood pressure Rectal Cancer Back Pain  Past Surgical History Ian Ruff, MD; 123456 12:51 PM) Colon Polyp Removal - Colonoscopy Appendectomy  Diagnostic Studies History Ian Ruff, MD; 123456 12:51 PM) Colonoscopy within last year  Allergies Elbert Ewings, [place]; 10/22/2014 9:20 AM) No Known Drug Allergies 10/09/2014  Medication History Ian Ruff, MD; 123456 12:51 PM) Hydrochlorothiazide (25MG  Tablet, Oral) Active. Losartan Potassium (50MG  Tablet, Oral) Active. Metoprolol Succinate ER (50MG  Tablet ER 24HR, Oral) Active. Omeprazole (20MG  Capsule  DR, Oral) Active. Niacin ER (Antihyperlipidemic) (500MG  Tablet ER, Oral) Active. Simvastatin (20MG  Tablet, Oral) Active. Medications Reconciled  Social History Ian Ruff, MD; 123456 12:51 PM) Tobacco use Never smoker. No drug use Alcohol use Occasional alcohol use. Caffeine use Coffee, Tea.  Family History Ian Ruff, MD; 123456 12:51 PM) Cerebrovascular Accident Brother. Colon Cancer Mother. Ovarian Cancer Sister. Prostate Cancer Brother. Heart disease in male family member before age 86 Diabetes Mellitus Mother. Heart Disease Brother, Father.  BP 194/92 mmHg  Pulse 72  Temp(Src) 97.5 F (36.4 C) (Oral)  Resp 18  Ht 5\' 7"  (1.702 m)  Wt 87.261 kg (192 lb 6 oz)  BMI 30.12 kg/m2  SpO2 99%  Physical Exam  General Mental Status-Alert. General Appearance-Consistent with stated age. Hydration-Well hydrated. Voice-Normal.  Head and Neck Head-normocephalic, atraumatic with no lesions or palpable masses. Trachea-midline. Thyroid Gland Characteristics - normal size and consistency.  Eye Eyeball - Bilateral-Extraocular movements intact. Sclera/Conjunctiva - Bilateral-No scleral icterus.  Chest and Lung Exam Chest and lung exam reveals -quiet, even and easy respiratory effort with no use of accessory muscles and on auscultation, normal breath sounds, no adventitious sounds and normal vocal resonance. Inspection Chest Wall - Normal. Back - normal.  Cardiovascular Cardiovascular examination reveals -normal heart sounds, regular rate and rhythm with no murmurs and normal pedal pulses bilaterally.  Abdomen Inspection Inspection of the abdomen reveals - No Hernias. Palpation/Percussion Palpation and Percussion of the abdomen reveal - Soft, Non Tender, No Rebound tenderness, No Rigidity (guarding) and No hepatosplenomegaly. Auscultation Auscultation of the abdomen reveals - Bowel sounds normal.  Rectal Anorectal  Exam Internal - Note: Mass noted at the level of the prostate anteriorly, no direct sphincter invasion noted. Significantly close to the anal canal. Fixed to underlying tissues.  Neurologic Neurologic evaluation reveals -alert and oriented x  3 with no impairment of recent or remote memory. Mental Status-Normal.  Musculoskeletal Global Assessment -Note:no gross deformities.  Normal Exam - Left-Upper Extremity Strength Normal and Lower Extremity Strength Normal. Normal Exam - Right-Upper Extremity Strength Normal and Lower Extremity Strength Normal.    Assessment & Plan Ian Ruff MD; 123456 9:51 AM)  PRIMARY CANCER OF RECTUM (C20) Impression: 81 year old male with a newly diagnosed anterior distal rectal cancer. Patient has a history of seed radiation for prostate cancer. The seeds are directly over the rectal cancer. There appears to be a plane between the 2 on ultrasound. We have discussed this in multidisciplinary GI tumor Board. It was recommended that he not undergo neoadjuvant chemoradiation due to his previous radiation to the prostate. The lung lesions and renal lesions were discussed as well. It was recommended that these be followed up after surgical resection. Given the nature of the tumor (sphincter involvement) and his age, I have recommended an abdominal perineal resection to obtain the best surgical margin and oncological outcome. I have discussed this with my urology colleagues. We will plan on doing the case together as I will need assistance dissecting out the prostate.  The surgery and anatomy were described to the patient as well as the risks of surgery and the possible complications.  These include: Bleeding, deep abdominal infections and possible wound complications such as hernia and infection, damage to adjacent structures, leak of surgical connections, which can lead to other surgeries and possibly an ostomy, possible need for other procedures, such as  abscess drains in radiology, possible prolonged hospital stay, possible diarrhea from removal of part of the colon, possible constipation from narcotics, prolonged fatigue/weakness or appetite loss, possible early recurrence of of disease, possible complications of their medical problems such as heart disease or arrhythmias or lung problems, death (less than 1%). I believe the patient understands and wishes to proceed with the surgery.

## 2014-12-11 NOTE — Op Note (Signed)
12/11/2014  2:22 PM  PATIENT:  Ian Christensen  81 y.o. male  Patient Care Team: [employer]mond [employer], MD as PCP - General (Internal Medicine)  PRE-OPERATIVE DIAGNOSIS:  rectal cancer  POST-OPERATIVE DIAGNOSIS:  rectal cancer  PROCEDURE:   XI ROBOTIC ASSISTED ABDOMINAL PERINEAL RESECTION WITH OMENTOPLASTY  Surgeon: Leighton Ruff, MD   ASSISTANT: Alexis Frock, MD   ANESTHESIA:   local and general  EBL:  Total I/O In: 2000 [I.V.:2000] Out: 275 [Urine:75; Blood:200]  DRAINS: (3F) Jackson-Pratt drain(s) with closed bulb suction in the pelvis   SPECIMEN:  Source of Specimen:  rectum with prostate and bladder  DISPOSITION OF SPECIMEN:  PATHOLOGY  COUNTS:  YES  PLAN OF CARE: Admit to inpatient   PATIENT DISPOSITION:  PACU - hemodynamically stable.  INDICATION: 81 y.o. M with h/o prostate cancer and radioactive seed implants who presents with anterior rectal cancer.   OR FINDINGS: No signs of metastatic disease.  No surgical plane between rectal cancer and prostate could be found.  DESCRIPTION: the patient was identified in the preoperative holding area and taken to the OR where they were laid supine on the operating room table.  General anesthesia was induced without difficulty. SCDs were also noted to be in place prior to the initiation of anesthesia.  The patient was placed in lithotomy position and was then prepped and draped in the usual sterile fashion.  A cystoscopy with stent placement was performed by Dr. Tresa Moore. The patient was then redraped and prepped for surgery.  A surgical timeout was performed indicating the correct patient, procedure, positioning and need for preoperative antibiotics.   I began by gaining access to the abdomen with a varies needle in the left upper quadrant. The abdomen was insufflated without difficulty. An 8 mm port was placed through the previously marked right lower quadrant ostomy site and camera inspection revealed no signs of injury.  The  varies needle was removed I then placed additional ports in a horizontal fashion across the abdomen placing a 12 mm port in the previously marked left lower quadrant ostomy site and assist 5 mm port was placed in the right lower quadrant and another 5 mm port was placed in the mid upper abdomen.  After adhesions were taken down laparoscopically we were able to mobilize the small bowel and colon out of the pelvis. The patient was placed in Trendelenburg position. We then docked the robot to the patient's left side. I began to mobilize the sigmoid colon out of the pelvis using sharp dissection and electrocautery. I entered into the retro-mesenteric plane using blunt dissection. Dissection was carried down through the presacral plane using blunt dissection and Bovie electrocautery. Once down to the pelvic floor, the lateral edges of the mesorectum were carefully dissected free. Once everything was free down to the pelvic floor except for the anterior space, I then turned the robotic console over to Dr. Tresa Moore. He then proceeded to dissect into the anterior plane between the prostate and the rectum. Several radioactive seeds were identified. When he had gotten down as far as he could anteriorly, I began to open up the peritoneal space from below. I made an incision around the anal canal using Bovie light cautery. Dissection was carried down around the anal canal also using electrocautery. The retro-mesenteric plane was entered bluntly and the lateral edges were divided until only the anterior portion remained. Once up to the level of the tumor, I was unable to find a plane between the prostate and the  rectum. We decided to try to remove a portion of the anterior prostate with our specimen. We divided through this but we were unable to save the urethra at the base of the bladder. There was concern that this was involved with tumor or possibly just scar tissue. Given these new findings, we decided to perform a cystecectomy  with prostatectomy. I turned the case over to Dr. Tresa Moore at this time and he completed this without difficulty.  After he completed this, I then mobilized the remaining portion of the rectum and sigmoid colon. I mobilized this off of the lateral sidewall using the vessel sealer device. I divided the rectosigmoid with a blue load stapler. I divided the vascular pedicle with the robotic vessel sealer.  We then measured 15 cm of distal sigmoid colon and then divided the colon again at this spot using a blue load laparoscopic stapler. The distal section would be our conduit that will run to the right side. The proximal section will be the colostomy. Once the entire specimen was free, we made a periumbilical incision and placed an [place] wound protector.  I made a defect in the abdominal wall at the previously marked left ostomy site and brought the remaining colon out through this. This was then secured into place. We then placed the cap back on the [place] wound protector and mobilize the right side omentum off of the colon using electrocautery. Once this was mobilized off of the right side, we then removed the cap and continued to mobilize off the mid transverse colon to allow for an omental pedicle. This was then placed down the left paracolic gutter and a easily reached to and filled approximately half of the pelvis posteriorly. I placed one suture to cover the iliac vessels on the right side with omentum. The left-sided vessels were already covered with the pedicle. I then placed a drain in the pelvis over the omentum and brought this out through the right lower quadrant assist port. This was sutured into place with 3-0 nylon suture. At this point I turned the case over to Dr. Tresa Moore once again to to create the ureter to colon anastomosis. This will be dictated in further detail in his note. After this was completed, the distal portion of the colon was brought through the right sided previously marked ostomy site per  Dr. Tresa Moore.  We then irrigated the abdomen with warm sterile water. Hemostasis was good.  The [place] wound protector was removed. We switched to clean instruments, gowns, gloves and drapes. The fascia was closed using interrupted 0 Novafil sutures. The subcutaneous tissue was closed using interrupted 2-0 Vicryl sutures. The skin was closed with a running 4-0 Vicryl suture. The remaining port sites were also closed using 4-0 Vicryl sutures.  Sterile dressings were placed over the incisions. Both ostomies were then matured in standard [place] fashion. Ostomy appliances were placed. The patient was then awakened from anesthesia and sent to the postanesthesia care unit in stable condition. All counts were correct per operating room staff.

## 2014-12-11 NOTE — Transfer of Care (Signed)
Immediate Anesthesia Transfer of Care Note  Patient: Ian Christensen  Procedure(s) Performed: Procedure(s): XI ROBOTIC ASSISTED ABDOMINAL PERINEAL RESECTION OMENTOPLASTY (N/A)  CYSTO,  PROSTATECTOMY, CYSTECTOMY colon conduit, BILATERAL LYMPH NODE DISSECTION (N/A)  Patient Location: PACU  Anesthesia Type:General  Level of Consciousness:  sedated, patient cooperative and responds to stimulation  Airway & Oxygen Therapy:Patient Spontanous Breathing and Patient connected to face mask oxgen  Post-op Assessment:  Report given to PACU RN and Post -op Vital signs reviewed and stable  Post vital signs:  Reviewed and stable  Last Vitals:  Filed Vitals:   12/11/14 0530  BP: 194/92  Pulse: 72  Temp: 36.4 C  Resp: 18    Complications: No apparent anesthesia complications

## 2014-12-11 NOTE — Progress Notes (Addendum)
Dr. Zella Richer on call notified of 575 mL output from JP drain since Dr. Marcello Moores notified by PACU nurse. Also telephone order to remove his positional arterial line received.

## 2014-12-12 LAB — CBC
HEMATOCRIT: 36.3 % — AB (ref 39.0–52.0)
HEMOGLOBIN: 12.4 g/dL — AB (ref 13.0–17.0)
MCH: 32.2 pg (ref 26.0–34.0)
MCHC: 34.2 g/dL (ref 30.0–36.0)
MCV: 94.3 fL (ref 78.0–100.0)
Platelets: 199 10*3/uL (ref 150–400)
RBC: 3.85 MIL/uL — ABNORMAL LOW (ref 4.22–5.81)
RDW: 13.1 % (ref 11.5–15.5)
WBC: 14 10*3/uL — ABNORMAL HIGH (ref 4.0–10.5)

## 2014-12-12 LAB — CREATININE, FLUID (PLEURAL, PERITONEAL, JP DRAINAGE): CREAT FL: 1.2 mg/dL

## 2014-12-12 LAB — BASIC METABOLIC PANEL
ANION GAP: 7 (ref 5–15)
BUN: 21 mg/dL — AB (ref 6–20)
CALCIUM: 8.2 mg/dL — AB (ref 8.9–10.3)
CO2: 25 mmol/L (ref 22–32)
Chloride: 106 mmol/L (ref 101–111)
Creatinine, Ser: 1.32 mg/dL — ABNORMAL HIGH (ref 0.61–1.24)
GFR calc Af Amer: 57 mL/min — ABNORMAL LOW (ref >60)
GFR calc non Af Amer: 49 mL/min — ABNORMAL LOW (ref >60)
GLUCOSE: 169 mg/dL — AB (ref 65–99)
Potassium: 4.2 mmol/L (ref 3.5–5.1)
Sodium: 138 mmol/L (ref 135–145)

## 2014-12-12 MED ORDER — SODIUM CHLORIDE 0.9 % IV BOLUS (SEPSIS)
500.0000 mL | Freq: Once | INTRAVENOUS | Status: AC
Start: 2014-12-12 — End: 2014-12-12
  Administered 2014-12-12: 500 mL via INTRAVENOUS

## 2014-12-12 NOTE — Op Note (Signed)
NAME:  Ian Christensen, YELL NO.:  [id number]  MEDICAL RECORD NO.:  [id number]  LOCATION:  1225                         FACILITY:  [facility]  PHYSICIAN:  Sebastian Ache, MD     DATE OF BIRTH:  [birth year]  DATE OF PROCEDURE: 12/11/2014                              OPERATIVE REPORT   PREOPERATIVE DIAGNOSES:  Rectal cancer, history of prostate cancer with recurrence, status post radiotherapy to the pelvis.  PROCEDURE: 1. Cystoscopy, bilateral pyelograms with interpretation. 2. Insertion of bilateral externalized ureteral stent. 3. Robotic-assisted laparoscopic radical cystoprostatectomy with     bilateral pelvic lymphadenectomy and colon conduit urinary     diversion.  ESTIMATED BLOOD LOSS:  250 mL.  COMPLICATIONS:  None.  SPECIMEN: 1. Right external iliac lymph nodes, right obturator lymph nodes,     right common iliac lymph nodes, left common iliac lymph nodes, left     external iliac lymph nodes, left obturator lymph nodes. 2. En bloc bladder, prostate, anus, rectum and sigmoid colon,     For permanent pathology.  ASSISTANT:  Romie Levee, M.D.  DRAINS: 1. Right lower quadrant colon conduit to Urostomy drainage with Bander stents in situ, right is red, left is     blue. 2. JP to  bulb suction. 3. Left lower quadrant colostomy.  PREOPERATIVE INDICATION:  Mr. Ian Christensen is a very pleasant 81 year old gentleman with a remote history of prostate cancer status post brachytherapy over a decade ago.  He has had PSA recurrence.  He was found on routine evaluation to have a worrisome rectal mass that was biopsy proven carcinoma.  Options were discussed for management and he wished to proceed with surgical extirpation under the care of the General Surgery team.  Given the patient's known history of radiation very near the area of operative intent, preoperative consultation was sought and we agreed that a combined approach would be most advantageous with some cystoscopy  and bilateral stents in assistance with the abdominoperineal resection with possibility of conversion to cystoprostatectomy in addition to abdominoperineal resection.  Informed consent was obtained and placed in medical record.  DESCRIPTION OF PROCEDURE:  The patient being [name], was verified.  Procedure being cysto and stent, abdominoperineal resection, possible cystoprostatectomy was confirmed.  Procedure was carried out  intravenous antibiotics administered.  General endotracheal anesthesia introduced and then the patient initially placed into a low lithotomy position.  After tucking his arms at his side, placing on pink pad, further fashioned on the operative table using 3-inch tape over foam padding across the xiphoid chest chest and performing test of steep T-berg positioning and found to be suitably positioned.  A sterile field was created by prepping and draping the patient's penis, perineum, proximal thighs using iodine x3.  Cystourethroscopy performed using a 23- French rigid cystoscope with 30 degree lens. Inspection of anterior and posterior urethra revealed only some mild fixation of the prostate consistent with prior radiotherapy.  Urinary bladder is unremarkable.  The right ureteral orifice was cannulated with a 5-French end-hole catheter and right retrograde pyelogram was obtained.  Right retrograde pyelogram demonstrated a single right ureter, single system right kidney.  No filling defects or narrowing noted.  This open- ended  catheter was advanced to the level of renal pelvis and set aside as externalized stent.  Similarly, left retrograde pyelogram was obtained.  Left pyelogram demonstrated a single left ureter with single system left kidney.  No filling defects or narrowing noted.  This was also advanced under radiographic guidance to the level of the left renal pelvis set aside as a left externalized stent.  Foley catheter was placed per urethra to  straight drain and the externalized stent was fashioned to this.  A new sterile field was created and abdominoperineal resection was performed as per separate dictated operative note by Dr. Romie Levee and I assisted her in these endeavors.  Purposefully, the plane between the rectosigmoid and prostate was left for the last part of this procedure and dissecting from a robotic abdominal approach, there was incredibly desmoplastic reaction between the rectum and the prostate. There were numerous brachytherapy seeds in this location.  Using simultaneous digital rectal exam, marking the area of palpable tumor, there was area of nodularity circumferentially around the rectum that was felt to possibly represent locally advanced rectal primary.  An attempt was made to further separate these planes from below and as per note by Dr. Maisie Fus, and again I assisted her in this approach as well. Then, the plane between the prostate and anterior rectal tissue and tumor was not discernible despite very careful Dissection.  Both Dr. Maisie Fus and myself felt it would be most oncologically sound and technically feasible to proceed with concomitant cystoprostatectomy given these findings. Additional  dissection was performed from below and a small cystotomy was made in effort to come around the tissue in question.  By this time, the both surgeons agreed that concomitant cystoprostatectomy would clearly warranted for both technical and oncologic reasons.  As such Attention was more focued on cystoprostatectomy from robotic aproacah beginning with identification of bilateral ureters.  First on the right side, the right ureter was identified as it coursed from the iliac vessels and dissected distally toward the area of the ureterovesical junction, where it was doubly clipped and ligated.  A tag suture was placed proximally.  The in situ stent was purposefully left and transected and in place with the specimen and  with the ureter at this point approximately 3 cm above the iliac crossing. Similarly, the left ureter was identified and taken to the ureterovesical junction, doubly clipped, ligated, and tied with a dark- colored suture.  This was mobilized to an apposition approximately 5 cm above the iliac crossing.  Again, the in situ externalized stent was purposely transected leaving with the cystoprostatectomy specimen in the ureter at this point.  Posterior dissection had already been performed inherently with the abdominoperineal resection at the lateral dissection.  The dissection proceeded just lateral to the medial umbilical ligaments, first on the left side sweeping the bladder wall away from the lateral pelvic sidewall.  Iliac vessels were identified as was endopelvic fascia, which was carefully swept away from the lateral aspect of the prostate toward the apex of the prostate.  A mirror-image dissection was performed on the right side.  Lymphadenectomy was then Performed. All  fiber fatty tissue in the confines of the right common iliac artery, and vein were carefully mobilized.  Lymphostasis was achieved with cold clips, this was labeled as right common iliac lymph nodes.  Next, the right external iliac group was dissected free in a similar fashion with the confines being right external iliac artery and vein, pelvic side wall and iliac bifurcation and  the obturator group was carefully dissected similarly as well.  The confines being right external iliac vein, obturator nerve, and pelvic side wall.  The obturator nerve was visualized during these maneuvers and uninjured.  A mirror-imaged lymphadenectomy was performed left side again the left common left external iliac lymph nodes and lymphostasis was achieved with cold clips.  During all aspects of dissection, the lymphatic tissue was set aside a separate packet.  Attention was then redirected at the cystoprostatectomy.  Anterior  attachments were taken down by developing Space of [place] between the umbilical ligaments.  After pedicle control was performed using vascular stapler x2 each side, this exposed the anterior base of the prostate and the dorsal venous complex, which was controlled using vascular load stapler.  Apical dissection was performed in the anterior plane by placing the cystoprostatectomy specimen on the superior traction and transecting the most distal apical prostate membranous urethra coldly.  Notably, this tissue was very desmoplastic and necrotic as expected.  The Foley catheter was taken out completely and inspected and intact as was the distal aspect of the ureteral stents.  Dr. Maisie Fus had freed up the anus and rectal aspect of the specimen, therefore the en bloc bladder, prostate, sigmoid, anus and rectal cancer en bloc after dividing the colon at a suitable location as per Dr. Maisie Fus' dissection.  A Segment of distal colon approximately 15 cm in length just proximal to this was carefully mobilized being great care to keep it on its mesenteric pedicle and taken out of continuity using bowel load stapler.  The plan being used as a right-sided colon conduit.  Further dissection proximal to this was performed by Dr. Maisie Fus to allow for left-sided end colostomy.  She also performed omentoplasty bringing omentum down into the large defect in the pelvis and also a covered up the iliac vessels inherently with this as well.  Robot was undocked, specimen retrieved, set aside for permanent pathology.  Urology team effort was then directed at colon conduit urinary diversion.  The segment in question appeared to be suitably vascularized.  The proximal staple line was excluded using running silk and ureteral to conduit colon anastomosis was performed first of the right ureter after removing the proximal portion of the in situ stent, placing a new red-colored Bander stent to 24 cm to anastomosis.  An  anastomosis was performed of the heel stitch of 4-0 Vicryl followed by two separate running suture layers ensuring mucosa-to-mucosa anastomosis and mucosal eversion sutures were placed on the proximal colon conduit mucosa prior to the anastomosis.  A single 4-0 chronic was placed through and through the colon conduit and the Bander stent to prevent early migration.  Similarly, the left ureter and conduit anastomosis was performed after having removed the more proximal end of this externalized stent placing a new blue-colored bandage stent to 26 cm to the anastomosis, otherwise similar as for the other side.  These anastomotic sites were chosen purposely in-between tenia coli in a position approximately 2 cm away from each other.  The distal end of this was brought through the previously marked stomal site Which had been  used for a 15 mm assitant port site after dilating the fascia to two surgeon's fingerbreadth.  This was matured by anchoring of the fascia x3 and then performing a rosebud type stomal anastomosis after all incision sites were then closed as per colon protocol.  Maturation of the colon and further colon procedures dictated as a separate operative note by Dr. Maisie Fus.  Notably, the abdomen was inspected prior to closure to verify no undue twisting of the ureters or proximal conduit segments, and none was found.  He had been awakened, taken to postanesthesia care in stable condition with plan for step-down admission today.  Also, notably Dr. Maisie Fus had directly spoken to the patient's daughters immediately before proceeding with the decision to perform cystoprostatectomy.  It has been discussed with the patient and family extensively before and also voiced understanding and desire to proceed with this intraoperatively as well.          ______________________________ Sebastian Ache, MD     TM/MEDQ  D:  12/11/2014  T:  12/11/2014  Job:  [id number]

## 2014-12-12 NOTE — Progress Notes (Signed)
1 Day Post-Op  Subjective: Doing well.  Pain controlled.    Objective: Vital signs in last 24 hours: Temp:  [97.4 F (36.3 C)-98.2 F (36.8 C)] 97.7 F (36.5 C) (11/24 0438) Pulse Rate:  [91-117] 95 (11/24 0600) Resp:  [9-24] 16 (11/24 0600) BP: (111-163)/(59-103) 132/67 mmHg (11/24 0600) SpO2:  [90 %-100 %] 97 % (11/24 0600) Arterial Line BP: (127-171)/(65-97) 127/79 mmHg (11/23 1730) Weight:  [91.5 kg (201 lb 11.5 oz)] 91.5 kg (201 lb 11.5 oz) (11/23 1742)    Intake/Output from previous day: 11/23 0701 - 11/24 0700 In: 4238.3 [I.V.:4188.3; IV Piggyback:50] Out: 2335 [Urine:610; Drains:1475; Blood:250] Intake/Output this shift:    General appearance: alert, cooperative and no distress Resp: breathing comfortably GI: soft, non distended, approp tender.  dressings c/d/i.  ostomy on left wtih some flatus in it.    Lab Results:   Recent Labs  12/12/14 0345  WBC 14.0*  HGB 12.4*  HCT 36.3*  PLT 199   BMET  Recent Labs  12/12/14 0345  NA 138  K 4.2  CL 106  CO2 25  GLUCOSE 169*  BUN 21*  CREATININE 1.32*  CALCIUM 8.2*   PT/INR No results for input(s): LABPROT, INR in the last 72 hours. ABG No results for input(s): PHART, HCO3 in the last 72 hours.  Invalid input(s): PCO2, PO2  Studies/Results: Dg Abd 1 View  12/11/2014  CLINICAL DATA:  Assess ureteral stent placement EXAM: ABDOMEN - 1 VIEW COMPARISON:  12/11/2014 FINDINGS: Scattered large and small bowel gas is identified. There are changes consistent with recent cystectomy and prostatectomy. Surgical drain is noted within the pelvis. Bilateral ureteral stents are seen extending from the kidneys into a urostomy. Extent through the ostomy site and appear to be in satisfactory position. No other focal abnormality is noted. Air is noted within the lateral abdominal wall on the right consistent with the recent surgery. IMPRESSION: Ureteral stents are noted in satisfactory position bilaterally. Electronically  Signed   By: Inez Catalina M.D.   On: 12/11/2014 19:22   Dg Retrograde Pyelogram  12/11/2014  CLINICAL DATA:  Bilateral retrograde pyelograms, perineal resection and possible prostatectomy. EXAM: RETROGRADE PYELOGRAM COMPARISON:  CT abdomen pelvis 10/08/2014. FLUOROSCOPY TIME:  0 minutes 36 seconds FINDINGS: Seven fluoroscopic spot views from intraoperative bilateral retrograde pyelograms are submitted. Scattered filling defects within the ureters are presumably air bubbles as they appear to wax and wane. Distal-most portion of the right ureter is poorly opacified, limiting evaluation. Left intrarenal collecting system is suboptimally evaluated due to motion and possible suboptimal distension superiorly. IMPRESSION: 1. Distal-most portion of the right ureter is poorly opacified, limiting evaluation. 2. Probable scattered air bubbles within the ureters bilaterally. Suboptimal evaluation of the left intrarenal collecting system. Please correlate clinically with intraoperative real-time dynamic evaluation. Electronically Signed   By: Lorin Picket M.D.   On: 12/11/2014 08:39    Anti-infectives: Anti-infectives    Start     Dose/Rate Route Frequency Ordered Stop   12/11/14 2200  cefoTEtan (CEFOTAN) 2 g in dextrose 5 % 50 mL IVPB     2 g 100 mL/hr over 30 Minutes Intravenous Every 12 hours 12/11/14 1756 12/11/14 2250   12/11/14 0522  cefoTEtan (CEFOTAN) 2 g in dextrose 5 % 50 mL IVPB     2 g 100 mL/hr over 30 Minutes Intravenous On call to O.R. 12/11/14 0522 12/11/14 1355      Assessment/Plan: s/p Procedure(s): XI ROBOTIC ASSISTED ABDOMINAL PERINEAL RESECTION OMENTOPLASTY (N/A)  CYSTO,  PROSTATECTOMY, CYSTECTOMY colon conduit, BILATERAL LYMPH NODE DISSECTION (N/A) Stents, urostomy per urology  Advance to clears. Continue entereg. Leave in stepdown considering bump in creatinine, drop in HCT, and elevated WBCs.      LOS: 1 day    [facility] 12/12/2014

## 2014-12-12 NOTE — Progress Notes (Signed)
1 Day Post-Op Subjective: Pain well controlled, negative flatus. JP drain with 1.5L output. 600cc urine output. JP creatinine 1.2.   Objective: Vital signs in last 24 hours: Temp:  [97.4 F (36.3 C)-98.2 F (36.8 C)] 97.7 F (36.5 C) (11/24 1200) Pulse Rate:  [91-117] 92 (11/24 1200) Resp:  [9-27] 22 (11/24 1200) BP: (111-184)/(59-103) 143/60 mmHg (11/24 1340) SpO2:  [90 %-100 %] 96 % (11/24 1200) Arterial Line BP: (127-171)/(65-97) 127/79 mmHg (11/23 1730) Weight:  [91.5 kg (201 lb 11.5 oz)] 91.5 kg (201 lb 11.5 oz) (11/23 1742)  Intake/Output from previous day: 11/23 0701 - 11/24 0700 In: 4238.3 [I.V.:4188.3; IV Piggyback:50] Out: 2335 [Urine:610; Drains:1475; Blood:250] Intake/Output this shift: Total I/O In: 800 [I.V.:800] Out: 398 [Urine:123; Drains:275]  Physical Exam:  General:alert, cooperative and appears stated age GI: tenderness: suprapubic Male genitalia: no penile lesions or discharge no testicular masses incision c/d. urostomy pink with bander stents draining clear urine  Lab Results:  Recent Labs  12/12/14 0345  HGB 12.4*  HCT 36.3*   BMET  Recent Labs  12/12/14 0345  NA 138  K 4.2  CL 106  CO2 25  GLUCOSE 169*  BUN 21*  CREATININE 1.32*  CALCIUM 8.2*   No results for input(s): LABPT, INR in the last 72 hours. No results for input(s): LABURIN in the last 72 hours. Results for orders placed or performed during the hospital encounter of 12/11/14  MRSA PCR Screening     Status: None   Collection Time: 12/11/14  7:04 PM  Result Value Ref Range Status   MRSA by PCR NEGATIVE NEGATIVE Final    Comment:        The GeneXpert MRSA Assay (FDA approved for NASAL specimens only), is one component of a comprehensive MRSA colonization surveillance program. It is not intended to diagnose MRSA infection nor to guide or monitor treatment for MRSA infections.     Studies/Results: Dg Abd 1 View  12/11/2014  CLINICAL DATA:  Assess ureteral stent  placement EXAM: ABDOMEN - 1 VIEW COMPARISON:  12/11/2014 FINDINGS: Scattered large and small bowel gas is identified. There are changes consistent with recent cystectomy and prostatectomy. Surgical drain is noted within the pelvis. Bilateral ureteral stents are seen extending from the kidneys into a urostomy. Extent through the ostomy site and appear to be in satisfactory position. No other focal abnormality is noted. Air is noted within the lateral abdominal wall on the right consistent with the recent surgery. IMPRESSION: Ureteral stents are noted in satisfactory position bilaterally. Electronically Signed   By: Inez Catalina M.D.   On: 12/11/2014 19:22   Dg Retrograde Pyelogram  12/11/2014  CLINICAL DATA:  Bilateral retrograde pyelograms, perineal resection and possible prostatectomy. EXAM: RETROGRADE PYELOGRAM COMPARISON:  CT abdomen pelvis 10/08/2014. FLUOROSCOPY TIME:  0 minutes 36 seconds FINDINGS: Seven fluoroscopic spot views from intraoperative bilateral retrograde pyelograms are submitted. Scattered filling defects within the ureters are presumably air bubbles as they appear to wax and wane. Distal-most portion of the right ureter is poorly opacified, limiting evaluation. Left intrarenal collecting system is suboptimally evaluated due to motion and possible suboptimal distension superiorly. IMPRESSION: 1. Distal-most portion of the right ureter is poorly opacified, limiting evaluation. 2. Probable scattered air bubbles within the ureters bilaterally. Suboptimal evaluation of the left intrarenal collecting system. Please correlate clinically with intraoperative real-time dynamic evaluation. Electronically Signed   By: Lorin Picket M.D.   On: 12/11/2014 08:39    Assessment/Plan:  81yo POD#1 cystoprostatectomy with colon conduit and  colostomy.  Recs: 1. Continue ICU management per critical care team 2. Urostomy draining well. Continue bander stents. Urine output is low, likely due to  dehydation and secondary losses. No need for catheter in conduit at this time as it appears it is draining well  Urology to continue to follow   LOS: 1 day   Ian Christensen, [place] 12/12/2014, 3:01 PM

## 2014-12-13 ENCOUNTER — Encounter (HOSPITAL_COMMUNITY): Payer: Self-pay | Admitting: General Surgery

## 2014-12-13 LAB — BASIC METABOLIC PANEL
Anion gap: 5 (ref 5–15)
BUN: 18 mg/dL (ref 6–20)
CHLORIDE: 108 mmol/L (ref 101–111)
CO2: 25 mmol/L (ref 22–32)
CREATININE: 0.95 mg/dL (ref 0.61–1.24)
Calcium: 7.9 mg/dL — ABNORMAL LOW (ref 8.9–10.3)
GFR calc Af Amer: >60 mL/min (ref >60)
GFR calc non Af Amer: >60 mL/min (ref >60)
GLUCOSE: 100 mg/dL — AB (ref 65–99)
POTASSIUM: 3.8 mmol/L (ref 3.5–5.1)
SODIUM: 138 mmol/L (ref 135–145)

## 2014-12-13 LAB — CBC
HEMATOCRIT: 28 % — AB (ref 39.0–52.0)
Hemoglobin: 9.7 g/dL — ABNORMAL LOW (ref 13.0–17.0)
MCH: 32.9 pg (ref 26.0–34.0)
MCHC: 34.6 g/dL (ref 30.0–36.0)
MCV: 94.9 fL (ref 78.0–100.0)
PLATELETS: 152 10*3/uL (ref 150–400)
RBC: 2.95 MIL/uL — ABNORMAL LOW (ref 4.22–5.81)
RDW: 13.4 % (ref 11.5–15.5)
WBC: 12.4 10*3/uL — AB (ref 4.0–10.5)

## 2014-12-13 LAB — GLUCOSE, CAPILLARY: Glucose-Capillary: 93 mg/dL (ref 65–99)

## 2014-12-13 MED ORDER — OXYCODONE-ACETAMINOPHEN 5-325 MG PO TABS
1.0000 | ORAL_TABLET | Freq: Four times a day (QID) | ORAL | Status: DC | PRN
  Administered 2014-12-13 (×2): 1 via ORAL
  Administered 2014-12-14 – 2014-12-15 (×3): 2 via ORAL
  Filled 2014-12-13: qty 1
  Filled 2014-12-13 (×2): qty 2
  Filled 2014-12-13: qty 1
  Filled 2014-12-13: qty 2

## 2014-12-13 MED ORDER — SODIUM CHLORIDE 0.9 % IV BOLUS (SEPSIS)
500.0000 mL | Freq: Once | INTRAVENOUS | Status: AC
  Administered 2014-12-13: 500 mL via INTRAVENOUS

## 2014-12-13 NOTE — Evaluation (Signed)
Physical Therapy Evaluation Patient Details Name: Ian Christensen MRN: [id number] DOB: 1931/04/08 Today's Date: 12/13/2014   History of Present Illness  XI ROBOTIC ASSISTED ABDOMINAL PERINEAL RESECTION OMENTOPLASTY ,CYSTO, PROSTATECTOMY, CYSTECTOMY colon conduit, BILATERAL LYMPH NODE DISSECTION on 12/11/14.  Pt has a h/o prostate CA; new colon CA  Clinical Impression  Patient  Did ambulate x 10' w/ 2 persons for safety.Legs noted to be weak.patient admitted with above diagnosis. Pt currently with functional limitations due to the deficits listed below (see PT Problem List).Pt will benefit from skilled PT to increase their independence and safety with mobility to allow discharge to the venue listed below.       Follow Up Recommendations SNF;Supervision/Assistance - 24 hour    Equipment Recommendations  Rolling walker with 5" wheels    Recommendations for Other Services       Precautions / Restrictions Precautions Precautions: Fall Precaution Comments: JP and ostomies Restrictions Weight Bearing Restrictions: No      Mobility  Bed Mobility Overal bed mobility: Needs Assistance Bed Mobility: Supine to Sit     Supine to sit: Min assist;HOB elevated     General bed mobility comments: light assistance for trunk  Transfers Overall transfer level: Needs assistance Equipment used: Rolling walker (2 wheeled) Transfers: Sit to/from Stand Sit to Stand: Min assist;+2 safety/equipment         General transfer comment: assist to rise and stabilize; extra time  Ambulation/Gait Ambulation/Gait assistance: Min assist;+2 safety/equipment Ambulation Distance (Feet): 10 Feet Assistive device: Rolling walker (2 wheeled) Gait Pattern/deviations: Step-to pattern;Step-through pattern;Shuffle     General Gait Details: upon standing up, noted knees to buckle slightly, only ambulated short distance with c/o dizzineeess  also.  Stairs            Wheelchair Mobility     Modified Rankin (Stroke Patients Only)       Balance Overall balance assessment: Needs assistance         Standing balance support: During functional activity;Bilateral upper extremity supported Standing balance-Leahy Scale: Poor                               Pertinent Vitals/Pain Pain Assessment: Faces Faces Pain Scale: Hurts even more Pain Location: abdomen Pain Intervention(s): Limited activity within patient's tolerance;Premedicated before session;Monitored during session;Repositioned    Home Living Family/patient expects to be discharged to:: Private residence Living Arrangements: Alone Available Help at Discharge: Family;Available PRN/intermittently;Friend(s) Type of Home: House Home Access: Stairs to enter     Home Layout: One level Home Equipment: None Additional Comments: wife has been in SNF for 15 years    Prior Function Level of Independence: Independent               Hand Dominance        Extremity/Trunk Assessment   Upper Extremity Assessment:  (strength WFLs but has arthritis; shoulders limited 90--pain)                     Communication   Communication: No difficulties  Cognition Arousal/Alertness: Awake/alert Behavior During Therapy: WFL for tasks assessed/performed Overall Cognitive Status: Within Functional Limits for tasks assessed                      General Comments      Exercises        Assessment/Plan    PT Assessment Patient needs continued PT services  PT Diagnosis  Difficulty walking;Generalized weakness;Acute pain   PT Problem List Decreased strength;Decreased activity tolerance;Decreased mobility;Decreased knowledge of use of DME;Decreased safety awareness;Decreased knowledge of precautions;Pain  PT Treatment Interventions DME instruction;Gait training;Functional mobility training;Therapeutic activities;Therapeutic exercise;Balance training;Stair training   PT Goals (Current goals  can be found in the Care Plan section) Acute Rehab PT Goals Patient Stated Goal: get strength back PT Goal Formulation: With patient Time For Goal Achievement: 12/27/14 Potential to Achieve Goals: Good    Frequency Min 3X/week   Barriers to discharge Decreased caregiver support      Co-evaluation   Reason for Co-Treatment: For patient/therapist safety PT goals addressed during session: Mobility/safety with mobility OT goals addressed during session: ADL's and self-care       End of Session   Activity Tolerance: Patient tolerated treatment well Patient left: in chair;with call bell/phone within reach;with chair alarm set Nurse Communication: Mobility status         Time: 8295-6213 PT Time Calculation (min) (ACUTE ONLY): 16 min   Charges:   PT Evaluation $Initial PT Evaluation Tier I: 1 Procedure     PT G CodesRada Hay 12/13/2014, 12:11 PM Blanchard Kelch PT [phone]

## 2014-12-13 NOTE — Progress Notes (Signed)
Patient ID: Ian Christensen, male   DOB: July 09, 1931, 81 y.o.   MRN: [id number] 2 Days Post-Op  Subjective: More sore today, but has been moving around.    Objective: Vital signs in last 24 hours: Temp:  [97.8 F (36.6 C)-98.7 F (37.1 C)] 98 F (36.7 C) (11/25 0800) Pulse Rate:  [72-89] 83 (11/25 1000) Resp:  [14-21] 19 (11/25 1000) BP: (92-143)/(38-60) 109/45 mmHg (11/25 1000) SpO2:  [92 %-98 %] 93 % (11/25 1000) Last BM Date: 12/12/14  Intake/Output from previous day: 11/24 0701 - 11/25 0700 In: 2400 [I.V.:2400] Out: 2483 [Urine:1933; Drains:450; Stool:100] Intake/Output this shift: Total I/O In: 400 [I.V.:400] Out: 595 [Urine:550; Drains:45]  General appearance: alert, cooperative and no distress Resp: breathing comfortably GI: soft, non distended, approp tender.  dressings c/d/i.  Significant flatus in bag.      Lab Results:   Recent Labs  12/12/14 0345 12/13/14 0350  WBC 14.0* 12.4*  HGB 12.4* 9.7*  HCT 36.3* 28.0*  PLT 199 152   BMET  Recent Labs  12/12/14 0345 12/13/14 0350  NA 138 138  K 4.2 3.8  CL 106 108  CO2 25 25  GLUCOSE 169* 100*  BUN 21* 18  CREATININE 1.32* 0.95  CALCIUM 8.2* 7.9*   PT/INR No results for input(s): LABPROT, INR in the last 72 hours. ABG No results for input(s): PHART, HCO3 in the last 72 hours.  Invalid input(s): PCO2, PO2  Studies/Results: Dg Abd 1 View  12/11/2014  CLINICAL DATA:  Assess ureteral stent placement EXAM: ABDOMEN - 1 VIEW COMPARISON:  12/11/2014 FINDINGS: Scattered large and small bowel gas is identified. There are changes consistent with recent cystectomy and prostatectomy. Surgical drain is noted within the pelvis. Bilateral ureteral stents are seen extending from the kidneys into a urostomy. Extent through the ostomy site and appear to be in satisfactory position. No other focal abnormality is noted. Air is noted within the lateral abdominal wall on the right consistent with the recent surgery.  IMPRESSION: Ureteral stents are noted in satisfactory position bilaterally. Electronically Signed   By: Inez Catalina M.D.   On: 12/11/2014 19:22    Anti-infectives: Anti-infectives    Start     Dose/Rate Route Frequency Ordered Stop   12/11/14 2200  cefoTEtan (CEFOTAN) 2 g in dextrose 5 % 50 mL IVPB     2 g 100 mL/hr over 30 Minutes Intravenous Every 12 hours 12/11/14 1756 12/11/14 2250   12/11/14 0522  cefoTEtan (CEFOTAN) 2 g in dextrose 5 % 50 mL IVPB     2 g 100 mL/hr over 30 Minutes Intravenous On call to O.R. 12/11/14 0522 12/11/14 1355      Assessment/Plan: s/p Procedure(s): XI ROBOTIC ASSISTED ABDOMINAL PERINEAL RESECTION OMENTOPLASTY (N/A)  CYSTO,  PROSTATECTOMY, CYSTECTOMY colon conduit, BILATERAL LYMPH NODE DISSECTION (N/A) Stents, urostomy per urology  Soft diet.   Continue entereg. Transfer to floor.      LOS: 2 days    [facility] 12/13/2014

## 2014-12-13 NOTE — Evaluation (Signed)
Occupational Therapy Evaluation Patient Details Name: Ian Christensen MRN: [id number] DOB: 02-13-1931 Today's Date: 12/13/2014    History of Present Illness XI ROBOTIC ASSISTED ABDOMINAL PERINEAL RESECTION OMENTOPLASTY ,CYSTO, PROSTATECTOMY, CYSTECTOMY colon conduit, BILATERAL LYMPH NODE DISSECTION on 12/11/14.  Pt has a h/o prostate CA; new colon CA   Clinical Impression   This 81 year old man was admitted for the above. At baseline, he is independent with adls/iadls.  He will benefit from skilled OT to increase safety and independence with adls.  Goals in acute are for min guard level   Follow Up Recommendations  SNF    Equipment Recommendations   (to be further assessed)    Recommendations for Other Services       Precautions / Restrictions Precautions Precautions: Fall Precaution Comments: JP and ostomies Restrictions Weight Bearing Restrictions: No      Mobility Bed Mobility Overal bed mobility: Needs Assistance Bed Mobility: Supine to Sit     Supine to sit: Min assist;HOB elevated     General bed mobility comments: light assistance for trunk  Transfers Overall transfer level: Needs assistance Equipment used: Rolling walker (2 wheeled) Transfers: Sit to/from Stand Sit to Stand: Min assist;+2 safety/equipment         General transfer comment: assist to rise and stabilize; extra time    Balance                                            ADL Overall ADL's : Needs assistance/impaired                         Toilet Transfer: Minimal assistance;+2 for safety/equipment (to recliner)             General ADL Comments: pt is able to perform UB adls with set up.  LB ADLs limited due to pain.  Needs max A overall.       Vision     Perception     Praxis      Pertinent Vitals/Pain Pain Assessment: Faces Faces Pain Scale: Hurts even more Pain Location: abdomen, when first standing up Pain Intervention(s): Limited  activity within patient's tolerance;Monitored during session;Premedicated before session;Repositioned     Hand Dominance     Extremity/Trunk Assessment Upper Extremity Assessment Upper Extremity Assessment:  (strength WFLs but has arthritis; shoulders limited 90--pain)           Communication Communication Communication: No difficulties   Cognition Arousal/Alertness: Awake/alert Behavior During Therapy: WFL for tasks assessed/performed Overall Cognitive Status: Within Functional Limits for tasks assessed                     General Comments       Exercises       Shoulder Instructions      Home Living Family/patient expects to be discharged to:: Private residence Living Arrangements: Alone                               Additional Comments: wife has been in SNF for 15 years      Prior Functioning/Environment Level of Independence: Independent             OT Diagnosis: Generalized weakness;Acute pain   OT Problem List: Decreased strength;Decreased activity tolerance;Decreased knowledge of use of DME or  AE;Pain   OT Treatment/Interventions: Self-care/ADL training;DME and/or AE instruction;Patient/family education;Therapeutic activities;Energy conservation    OT Goals(Current goals can be found in the care plan section) Acute Rehab OT Goals Patient Stated Goal: get strength back OT Goal Formulation: With patient Time For Goal Achievement: 12/27/14 Potential to Achieve Goals: Good ADL Goals Pt Will Perform Grooming: with min guard assist;standing Pt Will Perform Lower Body Bathing: with min guard assist;sit to/from stand;with adaptive equipment Pt Will Perform Lower Body Dressing: with min guard assist;with adaptive equipment;sit to/from stand Additional ADL Goal #1: pt will initiate rest break for energy conservation  OT Frequency: Min 2X/week   Barriers to D/C:            Co-evaluation PT/OT/SLP Co-Evaluation/Treatment: Yes Reason  for Co-Treatment: For patient/therapist safety PT goals addressed during session: Mobility/safety with mobility OT goals addressed during session: ADL's and self-care      End of Session    Activity Tolerance: Patient limited by fatigue Patient left: in chair;with call bell/phone within reach;with chair alarm set   Time: 4742-5956 OT Time Calculation (min): 18 min Charges:  OT General Charges $OT Visit: 1 Procedure OT Evaluation $Initial OT Evaluation Tier I: 1 Procedure G-Codes:    Elizzie Westergard 29-Dec-2014, 12:06 PM  Marica Otter, OTR/L [phone] 12-29-2014

## 2014-12-13 NOTE — Progress Notes (Signed)
2 Days Post-Op Subjective: Pain well controlled, gas in colostomy bag. JP drain with 450ccoutput. 1.9L urine output. Tolerating clear liquid diet   Objective: Vital signs in last 24 hours: Temp:  [97.9 F (36.6 C)-98.7 F (37.1 C)] 98.6 F (37 C) (11/25 1600) Pulse Rate:  [72-89] 83 (11/25 1600) Resp:  [14-21] 19 (11/25 1600) BP: (92-159)/(38-58) 159/54 mmHg (11/25 1600) SpO2:  [92 %-98 %] 96 % (11/25 1600)  Intake/Output from previous day: 11/24 0701 - 11/25 0700 In: 2400 [I.V.:2400] Out: 2483 [Urine:1933; Drains:450; Stool:100] Intake/Output this shift: Total I/O In: 1000 [I.V.:1000] Out: 1605 [Urine:1475; Drains:130]  Physical Exam:  General:alert, cooperative and appears stated age GI: tenderness: suprapubic Male genitalia: no penile lesions or discharge no testicular masses incision c/d. urostomy pink with bander stents draining clear urine  Lab Results:  Recent Labs  12/12/14 0345 12/13/14 0350  HGB 12.4* 9.7*  HCT 36.3* 28.0*   BMET  Recent Labs  12/12/14 0345 12/13/14 0350  NA 138 138  K 4.2 3.8  CL 106 108  CO2 25 25  GLUCOSE 169* 100*  BUN 21* 18  CREATININE 1.32* 0.95  CALCIUM 8.2* 7.9*   No results for input(s): LABPT, INR in the last 72 hours. No results for input(s): LABURIN in the last 72 hours. Results for orders placed or performed during the hospital encounter of 12/11/14  MRSA PCR Screening     Status: None   Collection Time: 12/11/14  7:04 PM  Result Value Ref Range Status   MRSA by PCR NEGATIVE NEGATIVE Final    Comment:        The GeneXpert MRSA Assay (FDA approved for NASAL specimens only), is one component of a comprehensive MRSA colonization surveillance program. It is not intended to diagnose MRSA infection nor to guide or monitor treatment for MRSA infections.     Studies/Results: Dg Abd 1 View  12/11/2014  CLINICAL DATA:  Assess ureteral stent placement EXAM: ABDOMEN - 1 VIEW COMPARISON:  12/11/2014 FINDINGS:  Scattered large and small bowel gas is identified. There are changes consistent with recent cystectomy and prostatectomy. Surgical drain is noted within the pelvis. Bilateral ureteral stents are seen extending from the kidneys into a urostomy. Extent through the ostomy site and appear to be in satisfactory position. No other focal abnormality is noted. Air is noted within the lateral abdominal wall on the right consistent with the recent surgery. IMPRESSION: Ureteral stents are noted in satisfactory position bilaterally. Electronically Signed   By: Inez Catalina M.D.   On: 12/11/2014 19:22    Assessment/Plan:  81yo POD#1 cystoprostatectomy with colon conduit and colostomy.  Recs: 1. Continue IP management per General Surgery 2. Urostomy draining well. Continue bander stents.  Urology to continue to follow   LOS: 2 days   MCKENZIE, PATRICK L 12/13/2014, 5:37 PM

## 2014-12-13 NOTE — Consult Note (Addendum)
WOC ostomy consult note: Colon Conduit  Stoma type/location: RLQ Stomal assessment/size: 1 and 1/4 inches oval, two stents intact.  Red = Right, Blue = left. Stoma is red, moist, slightly elevated Peristomal assessment: intact, clear Treatment options for stomal/peristomal skin: skin barrier ring and convex pouch Output light pink-tinged urine Ostomy pouching: 1pc.convex pouch with skin barrier ring  Education provided: Patient in ICU, but conversive and alert.  Taught that urostomy stoma is healthy and that the bedside urinary drainage bag will be used while in bed and disconnected when he is up, ultimately. Enrolled patient in [place] program: No   WOC ostomy consult note: Colostomy Stoma type/location: LLQ colostomy Stomal assessment/size: 1 and 3/8 inch round, moist, red, budded Peristomal assessment: intact, clear Treatment options for stomal/peristomal skin: skin barrier ring Output: scant amount brown liquid effluent. Ostomy pouching: 2pc. 2 and 1/4 inch pouching system Education provided: Patient is taught that [place] nurse will select the initial ostomy pouching system as focus is on fit. Enrolled patient in [place] program: No  WOC nursing team will follow and remain available to this patient, the nursing, surgical and medical teams.   Thanks, Maudie Flakes, MSN, RN, [place], Arther Abbott  Pager# [phone]

## 2014-12-13 NOTE — Care Management Note (Signed)
Case Management Note  Patient Details  Name: Ian Christensen MRN: [id number] Date of Birth: 1931/09/11  Subjective/Objective:             Cystectomy with diversion/hypotensive post op   Action/Plan:Date: December 13, 2014 Chart reviewed for concurrent status and case management needs. Will continue to follow patient for changes and needs: Velva Harman, RN, BSN, [place]   [phone]   Expected Discharge Date:                  Expected Discharge Plan:  Home/Self Care  In-House Referral:  NA  Discharge planning Services  CM Consult  Post Acute Care Choice:  NA Choice offered to:  NA  DME Arranged:    DME Agency:     HH Arranged:    HH Agency:     Status of Service:  In process, will continue to follow  Medicare Important Message Given:    Date Medicare IM Given:    Medicare IM give by:    Date Additional Medicare IM Given:    Additional Medicare Important Message give by:     If discussed at [place] of Stay Meetings, dates discussed:    Additional Comments:  Leeroy Cha, RN 12/13/2014, 8:47 AM

## 2014-12-14 LAB — BASIC METABOLIC PANEL
ANION GAP: 1 — AB (ref 5–15)
BUN: 19 mg/dL (ref 6–20)
CO2: 27 mmol/L (ref 22–32)
Calcium: 8.1 mg/dL — ABNORMAL LOW (ref 8.9–10.3)
Chloride: 113 mmol/L — ABNORMAL HIGH (ref 101–111)
Creatinine, Ser: 0.87 mg/dL (ref 0.61–1.24)
GFR calc Af Amer: >60 mL/min (ref >60)
Glucose, Bld: 109 mg/dL — ABNORMAL HIGH (ref 65–99)
POTASSIUM: 3.9 mmol/L (ref 3.5–5.1)
SODIUM: 141 mmol/L (ref 135–145)

## 2014-12-14 LAB — CBC
HCT: 26.4 % — ABNORMAL LOW (ref 39.0–52.0)
Hemoglobin: 9 g/dL — ABNORMAL LOW (ref 13.0–17.0)
MCH: 32.1 pg (ref 26.0–34.0)
MCHC: 34.1 g/dL (ref 30.0–36.0)
MCV: 94.3 fL (ref 78.0–100.0)
Platelets: 175 10*3/uL (ref 150–400)
RBC: 2.8 MIL/uL — ABNORMAL LOW (ref 4.22–5.81)
RDW: 13.3 % (ref 11.5–15.5)
WBC: 12.1 10*3/uL — ABNORMAL HIGH (ref 4.0–10.5)

## 2014-12-14 NOTE — Clinical Social Work Placement (Signed)
CLINICAL SOCIAL WORK PLACEMENT  NOTE  Date:  12/14/2014  Patient Details  Name: Ian Christensen MRN: [id number] Date of Birth: [birth year]  Clinical Social Work is seeking post-discharge placement for this patient at the Skilled  Nursing Facility level of care (*CSW will initial, date and re-position this form in  chart as items are completed):  Yes   Patient/family provided with [facility] Clinical Social Work Department's list of facilities offering this level of care within the geographic area requested by the patient (or if unable, by the patient's family).  Yes   Patient/family informed of their freedom to choose among providers that offer the needed level of care, that participate in Medicare, Medicaid or managed care program needed by the patient, have an available bed and are willing to accept the patient.  Yes   Patient/family informed of [facility]'s ownership interest in [facility] and [facility], as well as of the fact that they are under no obligation to receive care at these facilities.  PASRR submitted to EDS on 12/14/14     PASRR number received on 12/14/14     Existing PASRR number confirmed on       FL2 transmitted to all facilities in geographic area requested by pt/family on 12/14/14     FL2 transmitted to all facilities within larger geographic area on       Patient informed that his/her managed care company has contracts with or will negotiate with certain facilities, including the following:            Patient/family informed of bed offers received.  Patient chooses bed at       Physician recommends and patient chooses bed at      Patient to be transferred to   on  .  Patient to be transferred to facility by       Patient family notified on   of transfer.  Name of family member notified:        PHYSICIAN Please sign FL2     Additional Comment:    _______________________________________________ Orson Eva, LCSW 12/14/2014,  10:39 AM

## 2014-12-14 NOTE — Clinical Social Work Note (Signed)
Clinical Social Work Assessment  Patient Details  Name: Ian Christensen MRN: [id number] Date of Birth: 12/01/1931  Date of referral:  12/14/14               Reason for consult:  Discharge Planning                Permission sought to share information with:  Family Supports Permission granted to share information::  Yes, Verbal Permission Granted  Name::     Bosie Clos  Agency::     Relationship::  daughter  Contact Information:  [phone]  Housing/Transportation Living arrangements for the past 2 months:  Single Family Home Source of Information:  Patient, Adult Children Patient Interpreter Needed:  None Criminal Activity/Legal Involvement Pertinent to Current Situation/Hospitalization:  No - Comment as needed Significant Relationships:  Adult Children, Spouse Lives with:  Self Do you feel safe going back to the place where you live?  No Need for family participation in patient care:  Yes (Comment) (per pt request)  Care giving concerns:  Pt admitted from home alone. Pt s/p surgery. PT/OT evaluation recommending short term rehab at SNF.   Social Worker assessment / plan:  CSW reviewed chart and noted that PT/OT recommending New SNF.  CSW met with pt at bedside. CSW introduced self and explained role. Pt reports that he lives alone, but his wife is a long term resident at [facility] at [place]. CSW provided support as pt discussed that he has not been able to visit his wife in a week. CSW discussed with pt recommendation for short term rehab. Pt agreeable and stated that MD had discussed with pt and pt family prior to surgery that rehab may be needed. CSW discussed with pt that pt insurance [employer] is only in contract with a few facilities, but CSW would do a search in order to determine bed offers. Pt agreeable. Pt requested CSW contact pt daughter to discuss.   CSW contacted pt daughter, [name] via telephone. CSW introduced self and explained role. CSW updated pt  daughter on recommendations from PT/OT for short term rehab. Pt daughter expressed understanding and agreeable. Pt daughter familiar with SNF processes and CSW explained that [facility] will likely limit options and discussed insurance coverage. Pt daughter requested SNF search in [place] and [place]. Pt daughter questions and concerns clarified.   CSW completed FL2 and initiated SNF search to [facility] and [facility]. Pt has [employer] which requires authorization prior to pt d/c to SNF.   CSW to follow up with pt and pt daughter re: SNF bed offers.  CSW to continue to follow to provide support and assist with pt disposition needs.   Employment status:  Retired Database administrator PT Recommendations:  Skilled Nursing Facility Information / Referral to community resources:  Skilled Nursing Facility  Patient/Family's Response to care:  Pt alert and oriented x 4. Pt pleasant and engaged in conversation. Pt agreeable to short term rehab at [facility]. Pt daughter actively involved and supportive of pt during recovery and agreeable to SNF for rehab will be best plan as pt lives alone.   Patient/Family's Understanding of and Emotional Response to Diagnosis, Current Treatment, and Prognosis:  Pt and pt daughter display knowledge surrounding pt diagnosis and treatment plan. Pt and pt daughter were aware that rehab at SNF was a possibility.   Emotional Assessment Appearance:  Appears stated age Attitude/Demeanor/Rapport:  Other (pt appropriate) Affect (typically observed):  Appropriate Orientation:  Oriented to  Self, Oriented to Place, Oriented to  Time, Oriented to Situation Alcohol / Substance use:  Not Applicable Psych involvement (Current and /or in the community):  No (Comment)  Discharge Needs  Concerns to be addressed:  Discharge Planning Concerns Readmission within the last 30 days:  No Current discharge risk:  None Barriers to Discharge:   Continued Medical Work up   Orson Eva, LCSW 12/14/2014, 10:47 AM  [phone]

## 2014-12-14 NOTE — Progress Notes (Signed)
Patient ID: Ian Christensen, male   DOB: [birth year], 81 y.o.   MRN: [id number] 3 Days Post-Op  Subjective: Tolerating     Objective: Vital signs in last 24 hours: Temp:  [98.3 F (36.8 C)-99.3 F (37.4 C)] 98.3 F (36.8 C) (11/26 0647) Pulse Rate:  [70-87] 78 (11/26 0647) Resp:  [16-20] 18 (11/26 0647) BP: (123-159)/(51-60) 137/59 mmHg (11/26 0647) SpO2:  [94 %-98 %] 94 % (11/26 0647) Last BM Date: 12/12/14  Intake/Output from previous day: 11/25 0701 - 11/26 0700 In: 1000 [I.V.:1000] Out: 2295 [Urine:1975; Drains:320] Intake/Output this shift:    General appearance: alert, cooperative and no distress Resp: breathing comfortably GI: soft, non distended, approp tender.  dressings c/d/i.  Significant flatus in bag.      Lab Results:   Recent Labs  12/13/14 0350 12/14/14 0415  WBC 12.4* 12.1*  HGB 9.7* 9.0*  HCT 28.0* 26.4*  PLT 152 175   BMET  Recent Labs  12/13/14 0350 12/14/14 0415  NA 138 141  K 3.8 3.9  CL 108 113*  CO2 25 27  GLUCOSE 100* 109*  BUN 18 19  CREATININE 0.95 0.87  CALCIUM 7.9* 8.1*   PT/INR No results for input(s): LABPROT, INR in the last 72 hours. ABG No results for input(s): PHART, HCO3 in the last 72 hours.  Invalid input(s): PCO2, PO2  Studies/Results: No results found.  Anti-infectives: Anti-infectives    Start     Dose/Rate Route Frequency Ordered Stop   12/11/14 2200  cefoTEtan (CEFOTAN) 2 g in dextrose 5 % 50 mL IVPB     2 g 100 mL/hr over 30 Minutes Intravenous Every 12 hours 12/11/14 1756 12/11/14 2250   12/11/14 0522  cefoTEtan (CEFOTAN) 2 g in dextrose 5 % 50 mL IVPB     2 g 100 mL/hr over 30 Minutes Intravenous On call to O.R. 12/11/14 0522 12/11/14 1355      Assessment/Plan: s/p Procedure(s): XI ROBOTIC ASSISTED ABDOMINAL PERINEAL RESECTION OMENTOPLASTY (N/A)  CYSTO,  PROSTATECTOMY, CYSTECTOMY colon conduit, BILATERAL LYMPH NODE DISSECTION (N/A) Stents, urostomy per urology  Regular diet. Decrease  IVF. Recheck labs in AM. Hope to saline lock tomorrow Ostomy care.      LOS: 3 days    [facility] 12/14/2014

## 2014-12-14 NOTE — [place] FL2 (Signed)
[place] MEDICAID FL2 LEVEL OF CARE SCREENING TOOL     IDENTIFICATION  Patient Name: Ian Christensen Birthdate: [birth year] Sex: male Admission Date (Current Location): 12/11/2014  Seth Ward and [place] Number: [facility] and Address:  [facility],  [address], [place] [address]      Provider Number: [id number]  Attending Physician Name and Address:  Romie Levee, MD  Relative Name and Phone Number:       Current Level of Care: Hospital Recommended Level of Care: Skilled Nursing Facility Prior Approval Number:    Date Approved/Denied:   PASRR Number: [id number]  Discharge Plan: SNF    Current Diagnoses: Patient Active Problem List   Diagnosis Date Noted  . Pulmonary nodules/lesions, multiple 10/17/2014  . Bilateral renal masses 10/17/2014  . Rectal cancer (HCC) 10/07/2014  . Hypertension 11/17/2011  . High cholesterol 11/17/2011  . Prostate cancer (HCC) 11/17/2011  . Colon polyps 11/17/2011    Orientation ACTIVITIES/SOCIAL BLADDER RESPIRATION    Self, Time, Situation, Place  Active Urostomy Normal  BEHAVIORAL SYMPTOMS/MOOD NEUROLOGICAL BOWEL NUTRITION STATUS     (NONE) Colostomy (Stoma type/location: LLQ colostomy, Stomal assessment/size: 1 and 3/8 inch round, moist, red, budded, Ostomy pouching: 2pc. 2 and 1/4 inch pouching system) Diet (Regular Diet)  PHYSICIAN VISITS COMMUNICATION OF NEEDS Height & Weight Skin    Verbally 5\' 7"  (170.2 cm) 201 lbs. Surgical wounds (Incision (closed) 12/11/14 Abdomen treatment honeycomb dressing. Incision (closed) 12/11/14 Perineum treatment abdominal pads; mesh briefs)          AMBULATORY STATUS RESPIRATION    Assist extensive (min assist; +2 assist for safety/equipment) Normal      Personal Care Assistance Level of Assistance  Bathing, Feeding, Dressing Bathing Assistance: Maximum assistance Feeding assistance: Independent Dressing Assistance: Maximum assistance      Functional  Limitations Info  Sight, Hearing, Speech Sight Info: Impaired Hearing Info: Adequate Speech Info: Adequate       SPECIAL CARE FACTORS FREQUENCY  PT (By licensed PT), OT (By licensed OT)     PT Frequency: 5 x a week OT Frequency: 5 x a week           Additional Factors Info  Code Status, Allergies Code Status Info: FULL code status Allergies Info: No Known Allergies           Current Medications (12/14/2014): Current Facility-Administered Medications  Medication Dose Route Frequency Provider Last Rate Last Dose  . 0.9 % NaCl with KCl 20 mEq/ L  infusion   Intravenous Continuous Almond Lint, MD 100 mL/hr at 12/14/14 0246    . alvimopan (ENTEREG) capsule 12 mg  12 mg Oral BID Romie Levee, MD   12 mg at 12/14/14 0953  . antiseptic oral rinse (CPC / CETYLPYRIDINIUM CHLORIDE 0.05%) solution 7 mL  7 mL Mouth Rinse BID Romie Levee, MD   7 mL at 12/14/14 1000  . diphenhydrAMINE (BENADRYL) 12.5 MG/5ML elixir 12.5 mg  12.5 mg Oral Q6H PRN Romie Levee, MD       Or  . diphenhydrAMINE (BENADRYL) injection 12.5 mg  12.5 mg Intravenous Q6H PRN Romie Levee, MD      . enoxaparin (LOVENOX) injection 40 mg  40 mg Subcutaneous Q24H Romie Levee, MD   40 mg at 12/14/14 0953  . hydrochlorothiazide (HYDRODIURIL) tablet 12.5 mg  12.5 mg Oral Daily Romie Levee, MD   12.5 mg at 12/14/14 0954  . losartan (COZAAR) tablet 50 mg  50 mg Oral Daily Romie Levee, MD  50 mg at 12/14/14 0953  . metoprolol succinate (TOPROL-XL) 24 hr tablet 25 mg  25 mg Oral BID Romie Levee, MD   25 mg at 12/14/14 0953  . morphine 2 MG/ML injection 2-4 mg  2-4 mg Intravenous Q3H PRN Romie Levee, MD   2 mg at 12/13/14 0410  . ondansetron (ZOFRAN) tablet 4 mg  4 mg Oral Q6H PRN Romie Levee, MD       Or  . ondansetron [facility]) injection 4 mg  4 mg Intravenous Q6H PRN Romie Levee, MD   4 mg at 12/13/14 1255  . oxyCODONE-acetaminophen (PERCOCET/ROXICET) 5-325 MG per tablet 1-2 tablet  1-2 tablet Oral Q6H PRN  Almond Lint, MD   2 tablet at 12/14/14 0300  . pantoprazole (PROTONIX) EC tablet 40 mg  40 mg Oral Daily Romie Levee, MD   40 mg at 12/14/14 0865   Do not use this list as official medication orders. Please verify with discharge summary.  Discharge Medications:   Medication List    ASK your doctor about these medications        ascorbic acid 500 MG tablet  Commonly known as:  VITAMIN C  Take 500 mg by mouth daily.     aspirin EC 81 MG tablet  Take 162 mg by mouth at bedtime.     hydrochlorothiazide 25 MG tablet  Commonly known as:  HYDRODIURIL  Take 12.5 mg by mouth daily.     losartan 50 MG tablet  Commonly known as:  COZAAR  Take 50 mg by mouth daily.     metoprolol succinate 50 MG 24 hr tablet  Commonly known as:  TOPROL-XL  Take 25 mg by mouth 2 (two) times daily. Take with or immediately following a meal.     niacin 500 MG CR capsule  Take 500 mg by mouth at bedtime.     omeprazole 20 MG capsule  Commonly known as:  PRILOSEC  Take 1 capsule by mouth daily.     simvastatin 20 MG tablet  Commonly known as:  ZOCOR  Take 20 mg by mouth every evening.     vitamin B-12 1000 MCG tablet  Commonly known as:  CYANOCOBALAMIN  Take 1,000 mcg by mouth daily.        Relevant Imaging Results:  Relevant Lab Results:  Recent Labs    Additional Information SSN: [id number]. Patient has JP drain.  KIDD, SUZANNA A, LCSW

## 2014-12-14 NOTE — Progress Notes (Addendum)
3 Days Post-Op Subjective: Pain well controlled, gas/stool in colostomy bag. JP drain with 320 cc output. 2.0 L urine output. Tolerating soft diet   Objective: Vital signs in last 24 hours: Temp:  [98.3 F (36.8 C)-99.3 F (37.4 C)] 98.3 F (36.8 C) (11/26 0647) Pulse Rate:  [70-89] 78 (11/26 0647) Resp:  [14-20] 18 (11/26 0647) BP: (109-159)/(45-60) 137/59 mmHg (11/26 0647) SpO2:  [93 %-98 %] 94 % (11/26 0647)  Intake/Output from previous day: 11/25 0701 - 11/26 0700 In: 1000 [I.V.:1000] Out: 2295 [Urine:1975; Drains:320] Intake/Output this shift:    Physical Exam:  General:alert, cooperative and appears stated age GI: abd appr T, mild D  incision c/d. urostomy pink with bander stents draining clear urine. Good urostomy output JP ss  Lab Results:  Recent Labs  12/12/14 0345 12/13/14 0350 12/14/14 0415  HGB 12.4* 9.7* 9.0*  HCT 36.3* 28.0* 26.4*   BMET  Recent Labs  12/13/14 0350 12/14/14 0415  NA 138 141  K 3.8 3.9  CL 108 113*  CO2 25 27  GLUCOSE 100* 109*  BUN 18 19  CREATININE 0.95 0.87  CALCIUM 7.9* 8.1*   No results for input(s): LABPT, INR in the last 72 hours. No results for input(s): LABURIN in the last 72 hours. Results for orders placed or performed during the hospital encounter of 12/11/14  MRSA PCR Screening     Status: None   Collection Time: 12/11/14  7:04 PM  Result Value Ref Range Status   MRSA by PCR NEGATIVE NEGATIVE Final    Comment:        The GeneXpert MRSA Assay (FDA approved for NASAL specimens only), is one component of a comprehensive MRSA colonization surveillance program. It is not intended to diagnose MRSA infection nor to guide or monitor treatment for MRSA infections.     Studies/Results: No results found.  Assessment/Plan:  81yo POD#3 cystoprostatectomy with colon conduit and colostomy. Doing well.  Recs: 1. Continue IP management per General Surgery 2. Urostomy draining well. Continue bander stents.   3. Urology to continue to follow   LOS: 3 days   Nickie Retort 12/14/2014, 8:01 AM

## 2014-12-15 LAB — CBC
HEMATOCRIT: 27.1 % — AB (ref 39.0–52.0)
Hemoglobin: 9.3 g/dL — ABNORMAL LOW (ref 13.0–17.0)
MCH: 32.2 pg (ref 26.0–34.0)
MCHC: 34.3 g/dL (ref 30.0–36.0)
MCV: 93.8 fL (ref 78.0–100.0)
Platelets: 202 10*3/uL (ref 150–400)
RBC: 2.89 MIL/uL — AB (ref 4.22–5.81)
RDW: 13.4 % (ref 11.5–15.5)
WBC: 10.6 10*3/uL — ABNORMAL HIGH (ref 4.0–10.5)

## 2014-12-15 LAB — BASIC METABOLIC PANEL
ANION GAP: 5 (ref 5–15)
BUN: 14 mg/dL (ref 6–20)
CALCIUM: 7.8 mg/dL — AB (ref 8.9–10.3)
CHLORIDE: 106 mmol/L (ref 101–111)
CO2: 24 mmol/L (ref 22–32)
CREATININE: 0.86 mg/dL (ref 0.61–1.24)
GFR calc non Af Amer: >60 mL/min (ref >60)
Glucose, Bld: 102 mg/dL — ABNORMAL HIGH (ref 65–99)
Potassium: 3.7 mmol/L (ref 3.5–5.1)
SODIUM: 135 mmol/L (ref 135–145)

## 2014-12-15 NOTE — Progress Notes (Signed)
Patient ID: Ian Christensen, male   DOB: 1931-04-14, 81 y.o.   MRN: [id number] 4 Days Post-Op  Subjective: Doing great.  Tolerating regular diet.     Objective: Vital signs in last 24 hours: Temp:  [98.2 F (36.8 C)-99 F (37.2 C)] 98.2 F (36.8 C) (11/27 0445) Pulse Rate:  [74-86] 74 (11/27 0445) Resp:  [16-18] 16 (11/27 0445) BP: (136-141)/(46-64) 137/64 mmHg (11/27 0445) SpO2:  [94 %-96 %] 94 % (11/27 0445) Last BM Date: 12/14/14 (brown liquid in ostomy bag)  Intake/Output from previous day: 11/26 0701 - 11/27 0700 In: 1220 [P.O.:720; I.V.:500] Out: 3330 [Urine:2900; Drains:280; Stool:150] Intake/Output this shift:    General appearance: alert, cooperative and no distress Resp: breathing comfortably GI: soft, non distended, approp tender.  dressings c/d/i.  Flatus/stool in bag.  .      Lab Results:   Recent Labs  12/14/14 0415 12/15/14 0411  WBC 12.1* 10.6*  HGB 9.0* 9.3*  HCT 26.4* 27.1*  PLT 175 202   BMET  Recent Labs  12/14/14 0415 12/15/14 0411  NA 141 135  K 3.9 3.7  CL 113* 106  CO2 27 24  GLUCOSE 109* 102*  BUN 19 14  CREATININE 0.87 0.86  CALCIUM 8.1* 7.8*   PT/INR No results for input(s): LABPROT, INR in the last 72 hours. ABG No results for input(s): PHART, HCO3 in the last 72 hours.  Invalid input(s): PCO2, PO2  Studies/Results: No results found.  Anti-infectives: Anti-infectives    Start     Dose/Rate Route Frequency Ordered Stop   12/11/14 2200  cefoTEtan (CEFOTAN) 2 g in dextrose 5 % 50 mL IVPB     2 g 100 mL/hr over 30 Minutes Intravenous Every 12 hours 12/11/14 1756 12/11/14 2250   12/11/14 0522  cefoTEtan (CEFOTAN) 2 g in dextrose 5 % 50 mL IVPB     2 g 100 mL/hr over 30 Minutes Intravenous On call to O.R. 12/11/14 0522 12/11/14 1355      Assessment/Plan: s/p Procedure(s): XI ROBOTIC ASSISTED ABDOMINAL PERINEAL RESECTION OMENTOPLASTY (N/A)  CYSTO,  PROSTATECTOMY, CYSTECTOMY colon conduit, BILATERAL LYMPH NODE DISSECTION  (N/A) Stents, urostomy per urology  Regular diet. Saline lock IVF Recheck labs in AM.  Ostomy care.    to short term SNF when bed available.   LOS: 4 days    [facility]: Ian Christensen 12/15/2014

## 2014-12-15 NOTE — Clinical Social Work Note (Signed)
CSW reviewed bed offers.  [facility] was able to offer patient a STR/SNF bed at time of discharge.  CSW contacted patient's daughter Jackelyn Poling (per patient's request) and left message updating daughter on bed offer.  CSW also met with patient and provided updated information.  Patient was relieved and excited to be able to be with wife at time of discharge.  No further Sunday needs identified.  Weekday CSW to follow-up and assist with transition.   Nonnie Done, LCSW [phone]  [facility] Licensed Clinical Social Worker

## 2014-12-15 NOTE — Progress Notes (Signed)
4 Days Post-Op Subjective:  Pain well controlled, gas/stool in colostomy bag. JP drain with 150 cc output. 2.9 L urine output. Tolerating reg diet   Objective: Vital signs in last 24 hours: Temp:  [98.2 F (36.8 C)-99 F (37.2 C)] 98.2 F (36.8 C) (11/27 0445) Pulse Rate:  [74-86] 74 (11/27 0445) Resp:  [16-18] 16 (11/27 0445) BP: (136-141)/(46-64) 137/64 mmHg (11/27 0445) SpO2:  [94 %-96 %] 94 % (11/27 0445)  Intake/Output from previous day: 11/26 0701 - 11/27 0700 In: 1220 [P.O.:720; I.V.:500] Out: 3330 [Urine:2900; Drains:280; Stool:150] Intake/Output this shift:    Physical Exam:  General:alert, cooperative and appears stated age GI: abd appr T, mild D  incision c/d. urostomy pink with bander stents draining clear urine. Good urostomy output JP ss  Lab Results:  Recent Labs  12/13/14 0350 12/14/14 0415 12/15/14 0411  HGB 9.7* 9.0* 9.3*  HCT 28.0* 26.4* 27.1*   BMET  Recent Labs  12/14/14 0415 12/15/14 0411  NA 141 135  K 3.9 3.7  CL 113* 106  CO2 27 24  GLUCOSE 109* 102*  BUN 19 14  CREATININE 0.87 0.86  CALCIUM 8.1* 7.8*   No results for input(s): LABPT, INR in the last 72 hours. No results for input(s): LABURIN in the last 72 hours. Results for orders placed or performed during the hospital encounter of 12/11/14  MRSA PCR Screening     Status: None   Collection Time: 12/11/14  7:04 PM  Result Value Ref Range Status   MRSA by PCR NEGATIVE NEGATIVE Final    Comment:        The GeneXpert MRSA Assay (FDA approved for NASAL specimens only), is one component of a comprehensive MRSA colonization surveillance program. It is not intended to diagnose MRSA infection nor to guide or monitor treatment for MRSA infections.     Studies/Results: No results found.  Assessment/Plan:  81yo POD#4 cystoprostatectomy with colon conduit and colostomy. Doing well.  Recs: 1. Continue IP management per General Surgery 2. Urostomy draining well. Continue  bander stents. Dr. Tresa Moore will discuss duration of stents with patient tomorrow. 3. Urology to continue to follow   LOS: 4 days   Nickie Retort 12/15/2014, 11:48 AM

## 2014-12-16 LAB — BASIC METABOLIC PANEL
ANION GAP: 7 (ref 5–15)
BUN: 14 mg/dL (ref 6–20)
CHLORIDE: 105 mmol/L (ref 101–111)
CO2: 25 mmol/L (ref 22–32)
Calcium: 8.3 mg/dL — ABNORMAL LOW (ref 8.9–10.3)
Creatinine, Ser: 0.85 mg/dL (ref 0.61–1.24)
GFR calc Af Amer: >60 mL/min (ref >60)
GLUCOSE: 114 mg/dL — AB (ref 65–99)
POTASSIUM: 3.5 mmol/L (ref 3.5–5.1)
Sodium: 137 mmol/L (ref 135–145)

## 2014-12-16 NOTE — Progress Notes (Signed)
Patient ID: Ian Christensen, male   DOB: November 03, 1931, 81 y.o.   MRN: [id number] 5 Days Post-Op  Subjective:  Tolerating regular diet.  Having ostomy output.  Pain controlled.  Hasn't ambulated since Fri.  Hasn't had any ostomy teaching since Fri.  Objective: Vital signs in last 24 hours: Temp:  [98.3 F (36.8 C)-98.9 F (37.2 C)] 98.5 F (36.9 C) (11/28 0430) Pulse Rate:  [82-85] 82 (11/28 0430) Resp:  [18] 18 (11/28 0430) BP: (120-142)/(55-66) 137/55 mmHg (11/28 0430) SpO2:  [94 %-98 %] 98 % (11/28 0430) Last BM Date: 12/15/14  Intake/Output from previous day: 11/27 0701 - 11/28 0700 In: 240 [P.O.:240] Out: 4315 [Urine:3900; Drains:345; Stool:70] Intake/Output this shift:   General appearance: alert, cooperative and no distress Resp: breathing comfortably GI: soft, non distended, approp tender.  Wound c/d/i.  Flatus/stool in bag.  .     JP: serous drainage noted Perineal wound: dark skin edges noted, no purulent drainage, no cellulitis   Lab Results:   Recent Labs  12/14/14 0415 12/15/14 0411  WBC 12.1* 10.6*  HGB 9.0* 9.3*  HCT 26.4* 27.1*  PLT 175 202   BMET  Recent Labs  12/15/14 0411 12/16/14 0417  NA 135 137  K 3.7 3.5  CL 106 105  CO2 24 25  GLUCOSE 102* 114*  BUN 14 14  CREATININE 0.86 0.85  CALCIUM 7.8* 8.3*   PT/INR No results for input(s): LABPROT, INR in the last 72 hours. ABG No results for input(s): PHART, HCO3 in the last 72 hours.  Invalid input(s): PCO2, PO2  Studies/Results: No results found.  Anti-infectives: Anti-infectives    Start     Dose/Rate Route Frequency Ordered Stop   12/11/14 2200  cefoTEtan (CEFOTAN) 2 g in dextrose 5 % 50 mL IVPB     2 g 100 mL/hr over 30 Minutes Intravenous Every 12 hours 12/11/14 1756 12/11/14 2250   12/11/14 0522  cefoTEtan (CEFOTAN) 2 g in dextrose 5 % 50 mL IVPB     2 g 100 mL/hr over 30 Minutes Intravenous On call to O.R. 12/11/14 0522 12/11/14 1355      Assessment/Plan: s/p  Procedure(s): XI ROBOTIC ASSISTED ABDOMINAL PERINEAL RESECTION OMENTOPLASTY (N/A)  CYSTO,  PROSTATECTOMY, CYSTECTOMY colon conduit, BILATERAL LYMPH NODE DISSECTION (N/A) Stents, urostomy per Dr Ian Christensen Regular diet. Saline lock IVF Recheck labs in AM. Cont JP until perineal wound looks better  Ostomy care.    to short term SNF tomorrow once pt gets ostomy edu, better perineal wound care and PT today   LOS: 5 days    Ian Christensen C. [id number]

## 2014-12-16 NOTE — Care Management Important Message (Signed)
Important Message  Patient Details  Name: Ian Christensen MRN: [id number] Date of Birth: 09-23-1931   Medicare Important Message Given:  Yes    Camillo Flaming 12/16/2014, 11:37 AMImportant Message  Patient Details  Name: Ian Christensen MRN: [id number] Date of Birth: 05-Jan-1932   Medicare Important Message Given:  Yes    Camillo Flaming 12/16/2014, 11:36 AM

## 2014-12-16 NOTE — Progress Notes (Signed)
Physical Therapy Treatment Patient Details Name: RIJUL AMMAR MRN: [id number] DOB: [birth year] Today's Date: 12/16/2014    History of Present Illness XI ROBOTIC ASSISTED ABDOMINAL PERINEAL RESECTION OMENTOPLASTY ,CYSTO, PROSTATECTOMY, CYSTECTOMY colon conduit, BILATERAL LYMPH NODE DISSECTION on 12/11/14.  Pt has a h/o prostate CA; new colon CA    PT Comments    Pt progressing well with mobility, he walked 35' with RW and min/guard assist.   Follow Up Recommendations  SNF;Supervision/Assistance - 24 hour     Equipment Recommendations  Rolling walker with 5" wheels    Recommendations for Other Services       Precautions / Restrictions Precautions Precautions: Fall Precaution Comments: JP and ostomies Restrictions Weight Bearing Restrictions: No    Mobility  Bed Mobility   Bed Mobility: Rolling;Sidelying to Sit Rolling: Supervision Sidelying to sit: Supervision       General bed mobility comments: verbal cues for log rolling technique, pushed up with rail  Transfers   Equipment used: Rolling walker (2 wheeled) Transfers: Sit to/from Stand Sit to Stand: Min guard         General transfer comment: min/guard for safety  Ambulation/Gait   Ambulation Distance (Feet): 65 Feet Assistive device: Rolling walker (2 wheeled) Gait Pattern/deviations: Step-through pattern;Decreased stride length  Min/guard assist   Gait velocity interpretation: Below normal speed for age/gender General Gait Details: pt reports he has baseline of getting dizzy if he holds head up while walking, he looks down to floor, distance limited by fatigue and feeling flushed, VSS, HR 102, SaO2 91% RA, 146/60   Stairs            Wheelchair Mobility    Modified Rankin (Stroke Patients Only)       Balance             Standing balance-Leahy Scale: Poor                      Cognition Arousal/Alertness: Awake/alert Behavior During Therapy: WFL for tasks  assessed/performed Overall Cognitive Status: Within Functional Limits for tasks assessed                      Exercises      General Comments        Pertinent Vitals/Pain Pain Score: 0-No pain Faces Pain Scale: No hurt    Home Living Family/patient expects to be discharged to:: Skilled nursing facility Living Arrangements: Alone                  Prior Function            PT Goals (current goals can now be found in the care plan section) Acute Rehab PT Goals Patient Stated Goal: get strength back PT Goal Formulation: With patient Time For Goal Achievement: 12/27/14 Potential to Achieve Goals: Good Progress towards PT goals: Progressing toward goals    Frequency  Min 3X/week    PT Plan Current plan remains appropriate    Co-evaluation             End of Session Equipment Utilized During Treatment: Gait belt Activity Tolerance: Patient tolerated treatment well Patient left: in chair;with call bell/phone within reach;with chair alarm set     Time: 1442-1500 PT Time Calculation (min) (ACUTE ONLY): 18 min  Charges:  $Gait Training: 8-22 mins                    G Codes:      [place],  Jayson Waterhouse [place] 12/16/2014, 4:06 PM [phone]

## 2014-12-16 NOTE — Progress Notes (Signed)
5 Days Post-Op  Subjective:  1 - Rectal Cancer after Prostate Radiation - s/p cystoprostatectomy with abdominal perineal resection with colon conduit urinary diversion and end colostomy 12/11/2014 in combined Gen Surg - Urology case. Path pending.  Today " Ian Christensen " is w/o complaints. Resumed bowel function over weekend, Cr stable, excellent UOP, likely to rehab facility tomorrow.   Objective: Vital signs in last 24 hours: Temp:  [98.3 F (36.8 C)-98.9 F (37.2 C)] 98.5 F (36.9 C) (11/28 0430) Pulse Rate:  [82-85] 82 (11/28 0430) Resp:  [18] 18 (11/28 0430) BP: (120-142)/(55-66) 137/55 mmHg (11/28 0430) SpO2:  [94 %-98 %] 98 % (11/28 0430) Last BM Date: 12/15/14  Intake/Output from previous day: 11/27 0701 - 11/28 0700 In: 240 [P.O.:240] Out: 4315 [Urine:3900; Drains:345; Stool:70] Intake/Output this shift: Total I/O In: 240 [P.O.:240] Out: 630 [Urine:500; Drains:30; Stool:100]  EXAM: NAD, daughter at bedside Regular heart rate SNTND Stool and gas in LLQ end colostomy Clear yellow utine with bander stents in situ in RLQ colon conduit. JP with serous fluid Extraction and port sites c/d/i.  NO c/c/e  Lab Results:   Recent Labs  12/14/14 0415 12/15/14 0411  WBC 12.1* 10.6*  HGB 9.0* 9.3*  HCT 26.4* 27.1*  PLT 175 202   BMET  Recent Labs  12/15/14 0411 12/16/14 0417  NA 135 137  K 3.7 3.5  CL 106 105  CO2 24 25  GLUCOSE 102* 114*  BUN 14 14  CREATININE 0.86 0.85  CALCIUM 7.8* 8.3*   PT/INR No results for input(s): LABPROT, INR in the last 72 hours. ABG No results for input(s): PHART, HCO3 in the last 72 hours.  Invalid input(s): PCO2, PO2  Studies/Results: No results found.  Anti-infectives: Anti-infectives    Start     Dose/Rate Route Frequency Ordered Stop   12/11/14 2200  cefoTEtan (CEFOTAN) 2 g in dextrose 5 % 50 mL IVPB     2 g 100 mL/hr over 30 Minutes Intravenous Every 12 hours 12/11/14 1756 12/11/14 2250   12/11/14 0522  cefoTEtan  (CEFOTAN) 2 g in dextrose 5 % 50 mL IVPB     2 g 100 mL/hr over 30 Minutes Intravenous On call to O.R. 12/11/14 0522 12/11/14 1355      Assessment/Plan:  1 - Rectal Cancer after Prostate Radiation - doing great functionally following extensive surgery. OK for DC JP at anypoint and DC to rehab / home at anypoint from GU perspective. F/u arranged and on After Visit Summary. His Bander stents will remain in situ for about 1 month.    [facility], Ian Christensen 12/16/2014

## 2014-12-16 NOTE — Discharge Instructions (Addendum)
ABDOMINAL SURGERY: POST OP INSTRUCTIONS  1. DIET: Follow a light bland diet the first 24 hours after arrival home, such as soup, liquids, crackers, etc.  Be sure to include lots of fluids daily.  Avoid fast food or heavy meals as your are more likely to get nauseated.  Do not eat any uncooked fruits or vegetables for the next 2 weeks as your colon heals.  Try to eat a high protein diet as this will help your body heal. 2. Take your usually prescribed home medications unless otherwise directed. 3. PAIN CONTROL: a. Pain is best controlled by a usual combination of three different methods TOGETHER: i. Ice/Heat ii. Over the counter pain medication iii. Prescription pain medication b. Most patients will experience some swelling and bruising around the incisions.  Ice packs or heating pads (30-60 minutes up to 6 times a day) will help. Use ice for the first few days to help decrease swelling and bruising, then switch to heat to help relax tight/sore spots and speed recovery.  Some people prefer to use ice alone, heat alone, alternating between ice & heat.  Experiment to what works for you.  Swelling and bruising can take several weeks to resolve.   c. It is helpful to take an over-the-counter pain medication regularly for the first few weeks.  Choose one of the following that works best for you: i. Naproxen (Aleve, etc)  Two 220mg  tabs twice a day ii. Ibuprofen (Advil, etc) Three 200mg  tabs four times a day (every meal & bedtime) iii. Acetaminophen (Tylenol, etc) 500-650mg  four times a day (every meal & bedtime) d. A  prescription for pain medication (such as oxycodone, hydrocodone, etc) should be given to you upon discharge.  Take your pain medication as prescribed.  i. If you are having problems/concerns with the prescription medicine (does not control pain, nausea, vomiting, rash, itching, etc), please call us [phone] to see if we need to switch you to a different pain medicine that will work  better for you and/or control your side effect better. ii. If you need a refill on your pain medication, please contact your pharmacy.  They will contact our office to request authorization. Prescriptions will not be filled after 5 pm or on week-ends. 4. Avoid getting constipated.  Between the surgery and the pain medications, it is common to experience some constipation.  Increasing fluid intake and taking a fiber supplement (such as Metamucil, Citrucel, FiberCon, MiraLax, etc) 1-2 times a day regularly will usually help prevent this problem from occurring.  A mild laxative (prune juice, Milk of Magnesia, MiraLax, etc) should be taken according to package directions if there are no bowel movements after 48 hours.   5. Watch out for diarrhea.  If you have many loose bowel movements, simplify your diet to bland foods & liquids for a few days.  Stop any stool softeners and decrease your fiber supplement.  Switching to mild anti-diarrheal medications (Kayopectate, Pepto Bismol) can help.  If this worsens or does not improve, please call us. 6. Wash / shower every day.  You may shower over the incision / wound.  Avoid baths until the skin is fully healed.  Continue to shower over incision(s) after the dressing is off. 7. You may leave the abdominal incision pen to air.  You may replace a dressing/Band-Aid to cover the incision for comfort if you wish.  Keep a clean dressing on your buttock incision and clean wound and replace dressing daily. 8. ACTIVITIES as tolerated:  a. You may resume regular (light) daily activities beginning the next day--such as daily self-care, walking, climbing stairs--gradually increasing activities as tolerated.  If you can walk 30 minutes without difficulty, it is safe to try more intense activity such as jogging, treadmill, bicycling, low-impact aerobics, swimming, etc. b. Save the most intensive and strenuous activity for last such as sit-ups, heavy lifting, contact sports, etc   Refrain from any heavy lifting or straining until you are off narcotics for pain control.   c. DO NOT PUSH THROUGH PAIN.  Let pain be your guide: If it hurts to do something, don't do it.  Pain is your body warning you to avoid that activity for another week until the pain goes down. d. You may drive when you are no longer taking prescription pain medication, you can comfortably wear a seatbelt, and you can safely maneuver your car and apply brakes. e. Dennis Bast may have sexual intercourse when it is comfortable.  9. FOLLOW UP in our office a. Please call [facility] at [phone] to set up an appointment to see your surgeon in the office for a follow-up appointment approximately 1-2 weeks after your surgery. b. Make sure that you call for this appointment the day you arrive home to insure a convenient appointment time. 10. IF YOU HAVE DISABILITY OR FAMILY LEAVE FORMS, BRING THEM TO THE OFFICE FOR PROCESSING.  DO NOT GIVE THEM TO YOUR DOCTOR.   WHEN TO CALL us [phone]: 1. Poor pain control 2. Reactions / problems with new medications (rash/itching, nausea, etc)  3. Fever over 101.5 F (38.5 C) 4. Inability to urinate 5. Nausea and/or vomiting 6. Worsening swelling or bruising 7. Continued bleeding from incision. 8. Increased pain, redness, or drainage from the incision  The clinic staff is available to answer your questions during regular business hours (8:30am-5pm).  Please dont hesitate to call and ask to speak to one of our nurses for clinical concerns.   A surgeon from [facility] is always on call at the hospitals   If you have a medical emergency, go to the nearest emergency room or call 911.    [facility], [place], [place], [place], [place]  [address] ? MAIN: [phone] ? TOLL FREE: [phone] ? FAX [phone] [email or web address]

## 2014-12-16 NOTE — Progress Notes (Signed)
CSW continuing to follow.   CSW received notification from [facility] [place] that facility can offer pt a bed and facility is out of network with [employer].   CSW followed up with pt at bedside who requested CSW contact pt daughter, Ian Christensen. Pt hopeful that things will work out with [facility] at [place] as that is where pt wife is a resident.  CSW contacted pt daughter, Ian Christensen via telephone. CSW explained to pt daughter that [facility] is willing to accept pt, but facility is out of network with [employer] and discussed with pt daughter how Bernadene Person covers out of network facilities. Pt daughter wanted to speak with pt other daughter to notify CSW of decision.   CSW received return phone call from pt daughter, Ian Christensen stating that pt family is agreeable to [facility] and aware of out of network costs, but wish for pt to go to facility in order to be close to pt wife.  CSW notified [facility] and facility confirmed that they could accept pt when medically ready. [facility] stated that facility will initiate insurance authorization through [facility].   CSW to continue to follow to provide support and assist with pt discharge to [facility] when pt medically ready for discharge and [facility] authorization received.  Alison Murray, MSW, [place] Work [phone]

## 2014-12-16 NOTE — Consult Note (Signed)
WOC ostomy follow up [place] ostomy consult note: Colon Conduit  Stoma type/location: RLQ Stomal assessment/size: 1 and 1/4 inches oval, two stents intact. Red = Right, Blue = left. Stoma is red, moist, slightly elevated Peristomal assessment: intact, clear Treatment options for stomal/peristomal skin: skin barrier ring and convex pouch Output light pink-tinged urine Ostomy pouching: 1pc.convex pouch with skin barrier ring.  Attached to  Bedside drainage at this time.    Education provided: Patient participates in his pouch change today.  HE is able to connect and disconnect from the drainage bag.  HE understands how to open and close the pouch and the need to have it open when connected to drainage bag.  When OOB, he agrees to disconnect from bedside drainage for safety with ambulation.  Demonstrates the measuring of the stoma and applied the barrier ring to the pouch today.  He expresses increasing comfort in self care.  Ongoing education needed.  Enrolled patient in [place] program: No   WOC ostomy consult note: Colostomy Stoma type/location: LLQ colostomy Stomal assessment/size: Pouch intact and not changed today.  Peristomal assessment: not assessed Treatment options for stomal/peristomal skin: skin barrier ring Output: soft  Brown stool in pouch Ostomy pouching: 2pc. 2 and 1/4 inch pouching system Education provided: discussed that next session will be to change this pouch.  Verbalizes understanding.  Enrolled patient in [place] program: No WOC team will follow.  Domenic Moras RN BSN [place] Pager [phone]

## 2014-12-17 MED ORDER — OXYCODONE-ACETAMINOPHEN 5-325 MG PO TABS: 1.0000 | ORAL_TABLET | Freq: Four times a day (QID) | ORAL | Status: DC | PRN

## 2014-12-17 NOTE — Clinical Social Work Placement (Signed)
CLINICAL SOCIAL WORK PLACEMENT  NOTE  Date:  12/17/2014  Patient Details  Name: Ian Christensen MRN: [id number] Date of Birth: [birth year]  Clinical Social Work is seeking post-discharge placement for this patient at the Skilled  Nursing Facility level of care (*CSW will initial, date and re-position this form in  chart as items are completed):  Yes   Patient/family provided with [place] Clinical Social Work Department's list of facilities offering this level of care within the geographic area requested by the patient (or if unable, by the patient's family).  Yes   Patient/family informed of their freedom to choose among providers that offer the needed level of care, that participate in Medicare, Medicaid or managed care program needed by the patient, have an available bed and are willing to accept the patient.  Yes   Patient/family informed of [place]'s ownership interest in [facility] and [facility], as well as of the fact that they are under no obligation to receive care at these facilities.  PASRR submitted to EDS on 12/14/14     PASRR number received on 12/14/14     Existing PASRR number confirmed on       FL2 transmitted to all facilities in geographic area requested by pt/family on 12/14/14     FL2 transmitted to all facilities within larger geographic area on       Patient informed that his/her managed care company has contracts with or will negotiate with certain facilities, including the following:        Yes   Patient/family informed of bed offers received.  Patient chooses bed at [facility]     Physician recommends and patient chooses bed at      Patient to be transferred to [facility] at [place] on 12/17/14.  Patient to be transferred to facility by pt family via private vehicle     Patient family notified on 12/17/14 of transfer.  Name of family member notified:  pt notified at bedside along with pt daughter, [name]      PHYSICIAN Please sign FL2     Additional Comment:    _______________________________________________ Orson Eva, LCSW 12/17/2014, 3:49 PM

## 2014-12-17 NOTE — Discharge Summary (Signed)
Physician Discharge Summary  Patient ID: Ian Christensen MRN: [id number] DOB/AGE: [birth year] 81 y.o.  Admit date: 12/11/2014 Discharge date: 12/17/2014  Admission Diagnoses: Rectal cancer  Discharge Diagnoses:  Active Problems:   Rectal cancer [facility])   Discharged Condition: good  Hospital Course: patient admitted after robotic assisted cystectomy and APR.  His recovery was uneventful.  His diet was advanced as tolerated.  He made good UOP.  Upon working with PT, it was felt that he would benefit from some short term rehab.  He was discharged to a SNF.  Consults: None  Significant Diagnostic Studies: labs: CBC, chemistry  Treatments: IV hydration, analgesia: acetaminophen w/ codeine and surgery: see above  Discharge Exam: Blood pressure 112/90, pulse 87, temperature 97.9 F (36.6 C), temperature source Oral, resp. rate 16, height 5\' 7"  (1.702 m), weight 91.5 kg (201 lb 11.5 oz), SpO2 94 %. General appearance: alert and cooperative GI: soft, appropriately tender, ostomy pink and viable, JP with serous output Incision/Wound: abdominal and perineal wound without signs of infection  Disposition: 01-Home or Self Care     Medication List    TAKE these medications        ascorbic acid 500 MG tablet  Commonly known as:  VITAMIN C  Take 500 mg by mouth daily.     aspirin EC 81 MG tablet  Take 162 mg by mouth at bedtime.     hydrochlorothiazide 25 MG tablet  Commonly known as:  HYDRODIURIL  Take 12.5 mg by mouth daily.     losartan 50 MG tablet  Commonly known as:  COZAAR  Take 50 mg by mouth daily.     metoprolol succinate 50 MG 24 hr tablet  Commonly known as:  TOPROL-XL  Take 25 mg by mouth 2 (two) times daily. Take with or immediately following a meal.     niacin 500 MG CR capsule  Take 500 mg by mouth at bedtime.     omeprazole 20 MG capsule  Commonly known as:  PRILOSEC  Take 1 capsule by mouth daily.     oxyCODONE-acetaminophen 5-325 MG tablet  Commonly  known as:  PERCOCET/ROXICET  Take 1-2 tablets by mouth every 6 (six) hours as needed for moderate pain.     simvastatin 20 MG tablet  Commonly known as:  ZOCOR  Take 20 mg by mouth every evening.     vitamin B-12 1000 MCG tablet  Commonly known as:  CYANOCOBALAMIN  Take 1,000 mcg by mouth daily.           Follow-up Information    Follow up with Alexis Frock, MD On 01/09/2015.   Specialty:  Urology   Why:  at 12:15 for MD visit and office stent removal.    Contact information:   [place] [address] [phone]       Follow up with Rosario Adie., MD. Go on [id number].   Specialty:  General Surgery   Why:  at 9:30am for JP removal.  Please be there 15 mins early.     Contact information:   [address] [place] [address] [phone]       Follow up with [facility] .   Specialties:  [place], [place]   Contact information:   [address] [place] [phone]      Signed: Rosario Adie [id number], 8:33 AM

## 2014-12-17 NOTE — Progress Notes (Signed)
Pt for discharge to [place] at [facility].   CSW facilitated pt discharge needs including contacting facility, confirming facility received [facility] authorization, discussing with pt and pt daughter, Jackelyn Poling, providing RN phone number to call report, and providing discharge packet to pt family to provide to [facility] upon arrival. Pt family transporting pt via private vehicle.   No further social work needs identified at this time.   CSW signing off.   Alison Murray, MSW, [place] Work [phone]

## 2014-12-17 NOTE — Consult Note (Addendum)
WOC ostomy follow up Stoma type/location: LLQ colostomy Stomal assessment/size: 1 and 3/8 inch round, red, moist Peristomal assessment: intact, clear Treatment options for stomal/peristomal skin: skin barrier ring Output brown stool Ostomy pouching: 2pc. 2 and 1/4 inch pouching system with skin barrier ring Education provided: Patient is able to demonstrate closure opening for emptying and resealing the Lock and Roll closure.Extended session to practice emptying pouch into toilet (note: he is weak and required a nurse to steady him while her performed this task). He also practiced connecting and disconnecting the bedside urinary drainage bag to his urostomy pouch several times. Enrolled patient in [place] Start Discharge program: Yes-for both stomas  Patient is being discharged today to [facility] [place], a Rehab facility for continuing care. Following this stay, he will be discharged home with [facility] until independent in his own care. Dr. Tresa Moore stopped in during our visit and patient is to follow up with both surgeons per scheduled appointments.  Thanks, Maudie Flakes, MSN, RN, [place], Arther Abbott  Pager# [phone]

## 2014-12-17 NOTE — Progress Notes (Signed)
6 Days Post-Op  Subjective:  1 - Rectal Cancer after Prostate Radiation - s/p cystoprostatectomy with abdominal perineal resection with colon conduit urinary diversion and end colostomy 12/11/2014 in combined Gen Surg - Urology case. Path pending.  Today " Ian Christensen " is w/o complaints. He is in good spirits, tolerating a regular diet with soft stool and gas in ostomy bag.  Cr yesterday stable, excellent UOP, discharging to rehab facility today.   Objective: Vital signs in last 24 hours: Temp:  [97.9 F (36.6 C)-99.4 F (37.4 C)] 97.9 F (36.6 C) (11/29 0629) Pulse Rate:  [85-102] 87 (11/29 0629) Resp:  [16-18] 16 (11/29 0629) BP: (112-142)/(53-90) 112/90 mmHg (11/29 0629) SpO2:  [90 %-99 %] 94 % (11/29 0629) Last BM Date: 12/15/14  Intake/Output from previous day: 11/28 0701 - 11/29 0700 In: 480 [P.O.:480] Out: 2345 [Urine:1700; Drains:365; Stool:280] Intake/Output this shift: Total I/O In: -  Out: 700 [Urine:700]  EXAM: NAD, daughter and wife at bedside Regular heart rate SNTND Stool and gas in LLQ end colostomy Clear yellow utine with bander stents in situ in RLQ colon conduit. JP out. Extraction and port sites c/d/i.  NO c/c/e  Lab Results:   Recent Labs  12/15/14 0411  WBC 10.6*  HGB 9.3*  HCT 27.1*  PLT 202   BMET  Recent Labs  12/15/14 0411 12/16/14 0417  NA 135 137  K 3.7 3.5  CL 106 105  CO2 24 25  GLUCOSE 102* 114*  BUN 14 14  CREATININE 0.86 0.85  CALCIUM 7.8* 8.3*   PT/INR No results for input(s): LABPROT, INR in the last 72 hours. ABG No results for input(s): PHART, HCO3 in the last 72 hours.  Invalid input(s): PCO2, PO2  Studies/Results: No results found.  Anti-infectives: Anti-infectives    Start     Dose/Rate Route Frequency Ordered Stop   12/11/14 2200  cefoTEtan (CEFOTAN) 2 g in dextrose 5 % 50 mL IVPB     2 g 100 mL/hr over 30 Minutes Intravenous Every 12 hours 12/11/14 1756 12/11/14 2250   12/11/14 0522  cefoTEtan  (CEFOTAN) 2 g in dextrose 5 % 50 mL IVPB     2 g 100 mL/hr over 30 Minutes Intravenous On call to O.R. 12/11/14 0522 12/11/14 1355      Assessment/Plan:  1 - Rectal Cancer after Prostate Radiation - doing great functionally following extensive surgery. Agree with DC to rehab / home today from GU perspective. F/u arranged and on After Visit Summary. His Bander stents will remain in situ for about 1 month.    Ian Christensen 12/17/2014

## 2014-12-20 ENCOUNTER — Ambulatory Visit (HOSPITAL_COMMUNITY): Payer: Medicare HMO | Admitting: Hematology & Oncology

## 2014-12-25 ENCOUNTER — Encounter (HOSPITAL_COMMUNITY): Payer: Self-pay | Admitting: Hematology & Oncology

## 2014-12-25 ENCOUNTER — Encounter (HOSPITAL_COMMUNITY): Payer: Medicare HMO | Attending: Hematology & Oncology | Admitting: Hematology & Oncology

## 2014-12-25 VITALS — BP 90/62 | HR 102 | Temp 97.6°F | Resp 18 | Wt 183.1 lb

## 2014-12-25 DIAGNOSIS — I10 Essential (primary) hypertension: Secondary | ICD-10-CM

## 2014-12-25 DIAGNOSIS — I952 Hypotension due to drugs: Secondary | ICD-10-CM

## 2014-12-25 DIAGNOSIS — R911 Solitary pulmonary nodule: Secondary | ICD-10-CM | POA: Diagnosis not present

## 2014-12-25 DIAGNOSIS — C20 Malignant neoplasm of rectum: Secondary | ICD-10-CM

## 2014-12-25 DIAGNOSIS — C61 Malignant neoplasm of prostate: Secondary | ICD-10-CM

## 2014-12-25 DIAGNOSIS — R42 Dizziness and giddiness: Secondary | ICD-10-CM

## 2014-12-25 NOTE — Patient Instructions (Addendum)
[place] at [facility] Discharge Instructions  RECOMMENDATIONS MADE BY THE CONSULTANT AND ANY TEST RESULTS WILL BE SENT TO YOUR REFERRING PHYSICIAN.  Exam completed by Dr Barbee Shropshire today Stage 2 Rectal Cancer  Stop taking hydrodiruril Decrease cozaar to 25 mg (1/2 tablet of 50 mg)  Return to see the doctor after Christmas to discuss options about your treatment. Please call the clinic if you have any questions or concerns   Thank you for choosing [place] at [facility] to provide your oncology and hematology care.  To afford each patient quality time with our provider, please arrive at least 15 minutes before your scheduled appointment time.    You need to re-schedule your appointment should you arrive 10 or more minutes late.  We strive to give you quality time with our providers, and arriving late affects you and other patients whose appointments are after yours.  Also, if you no show three or more times for appointments you may be dismissed from the clinic at the providers discretion.     Again, thank you for choosing [facility].  Our hope is that these requests will decrease the amount of time that you wait before being seen by our physicians.       _____________________________________________________________  Should you have questions after your visit to [facility], please contact our office at [phone] between the hours of 8:30 a.m. and 4:30 p.m.  Voicemails left after 4:30 p.m. will not be returned until the following business day.  For prescription refill requests, have your pharmacy contact our office.

## 2014-12-25 NOTE — Progress Notes (Signed)
[facility] Note  Patient Care Team: Redmond School, MD as PCP - General (Internal Medicine)  CHIEF COMPLAINTS/PURPOSE OF CONSULTATION:  Stage IIIB rectal adenocarcinoma Colonoscopy on 10/02/2014 with one third circumferential medium mass found in the distal rectum, ulcerated, firm anterior over prostate bed with multiple biopsies, final pathology c/w adenocarcinoma GI primary CT of the chest abdomen and pelvis on 10/08/2014 showing wall thickening of the rectum, multiple bilateral pulmonary nodules, nodular soft tissue within the left main stem bronchus which is nonspecific, no evidence of focal hepatic lesion, bilateral low attenuation renal lesions with internal densities greater than that of fluid, indeterminate History of prostate cancer treated with brachytherapy, followed by Dr. Jeffie Pollock  EUS on 10/10/2014 with final staging uT3 uN1b, stage IIIB rectal adenocarcinoma along the anterior wall of the distal rectum with distal edge 1-2 cm from the anal verge  HISTORY OF PRESENTING ILLNESS:  Ian Christensen 81 y.o. male is here for further follow-up of his rectal carcinoma. He has a history of prostate cancer treated with brachytherapy, with known recurrence. After consultation he wished to proceed with surgical extirpation. He underwent a robotic assisted APR omentoplasty, cysto, prostatectomy, cystectomy, colon conduit, bilateral LN dissection, stents and urostomy on 12/11/2014.  Final pathology T3 N0 M0 rectal carcinoma with 20 LN examined. Prostate revealed prostate adenocarcinoma with a gleason score of 4+4 = 8.  Mr. Olander is accompanied by his two daughters today. He stays on a rehabilitation unit at the nursing home currently.   His only complaint at this time is that his blood pressure is low. Reports seeing spots in his vision occasionally which he attributes to his BP. He states that his blood pressure was good while in the hospital. He is currently  on blood pressure medication.  If he stands for an extended time period, he becomes dizzy.  His appetite is returning and he forces himself to eat. One daughter is a Administrator, sports with [employer] and has been monitoring his intake and giving him supplements. He is still getting used to changing his colostomy and Urostomy bags. Overall he thinks he is doing well but notes he still has a way to go before he feels a decent recovery.    MEDICAL HISTORY:  Past Medical History  Diagnosis Date  . High cholesterol   . HTN (hypertension)   . Anal fissure   . Arthritis   . Colon polyps   . GERD (gastroesophageal reflux disease)   . Prostate cancer ([place])     in remission 2001. (seed implants)  . Rectal cancer ([place]) 10/07/2014  . Cataract     left eye   . Tinnitus   . Imbalance   . History of measles   . History of hiatal hernia     SURGICAL HISTORY: Past Surgical History  Procedure Laterality Date  . Appendectomy  1955  . Colonoscopy      removes 1 polp  . Eus N/A 10/10/2014    Procedure: LOWER ENDOSCOPIC ULTRASOUND (EUS);  Surgeon: Milus Banister, MD;  Location: [facility];  Service: Endoscopy;  Laterality: N/A;  . Xi robotic assisted lower anterior resection N/A 12/11/2014    Procedure: XI ROBOTIC ASSISTED ABDOMINAL PERINEAL RESECTION OMENTOPLASTY;  Surgeon: Leighton Ruff, MD;  Location: [facility];  Service: General;  Laterality: N/A;  . Cysto N/A 12/11/2014    Procedure:  CYSTO,  PROSTATECTOMY, CYSTECTOMY colon conduit, BILATERAL LYMPH NODE DISSECTION;  Surgeon: Alexis Frock, MD;  Location:  [facility];  Service: Urology;  Laterality: N/A;    SOCIAL HISTORY: Social History   Social History  . Marital Status: Married    Spouse Name: N/A  . Number of Children: 2  . Years of Education: N/A   Occupational History  . retired    Social History Main Topics  . Smoking status: Never Smoker   . Smokeless tobacco: Never Used  . Alcohol Use: 0.0 oz/week    0 Standard drinks or  equivalent per week     Comment: rarely  . Drug Use: No  . Sexual Activity: Not on file   Other Topics Concern  . Not on file   Social History Narrative  2 children, 3 grandchildren, 1 expecting great grandchild Married Previously employed as a Psychologist, sport and exercise, tobacco raising Non-smoker ETOH, none Hobbies included golfing.  He no longer plays   FAMILY HISTORY: Family History  Problem Relation Age of Onset  . Colon cancer Sister   . Prostate cancer Brother   . Diabetes Mother   . Heart disease Mother   . Heart disease Father   . Colon polyps Daughter     x 2   indicated that his mother is deceased. He indicated that his father is deceased. He indicated that his sister is deceased. He indicated that his brother is deceased.  Mother deceased, 27 Father deceased, 33, heart attack 6 brothers, 3 sisters.  1 brother deceased from prostate cancer 1 sister deceased from colon cancer (4) 1 brother deceased from heart attack  ALLERGIES:  has No Known Allergies.  MEDICATIONS:  Current Outpatient Prescriptions  Medication Sig Dispense Refill  . Acetaminophen (TYLENOL PO) Take 1 tablet by mouth.    . hydrochlorothiazide (HYDRODIURIL) 25 MG tablet Take 12.5 mg by mouth daily.     Marland Kitchen losartan (COZAAR) 50 MG tablet Take 50 mg by mouth daily.    . metoprolol succinate (TOPROL-XL) 50 MG 24 hr tablet Take 25 mg by mouth 2 (two) times daily. Take with or immediately following a meal.    . omeprazole (PRILOSEC) 20 MG capsule Take 1 capsule by mouth daily.    . simvastatin (ZOCOR) 20 MG tablet Take 20 mg by mouth every evening.    Marland Kitchen ascorbic acid (VITAMIN C) 500 MG tablet Take 500 mg by mouth daily.    Marland Kitchen aspirin EC 81 MG tablet Take 162 mg by mouth at bedtime.     . niacin 500 MG CR capsule Take 500 mg by mouth at bedtime.    Marland Kitchen oxyCODONE-acetaminophen (PERCOCET/ROXICET) 5-325 MG tablet Take 1-2 tablets by mouth every 6 (six) hours as needed for moderate pain. (Patient not taking: Reported on  12/25/2014) 40 tablet 0  . vitamin B-12 (CYANOCOBALAMIN) 1000 MCG tablet Take 1,000 mcg by mouth daily.     No current facility-administered medications for this visit.    Review of Systems  Constitutional: Negative.   HENT: Negative.   Eyes: Negative.        Spots in his vision due to low blood pressure.  Respiratory: Negative.   Cardiovascular: Negative.   Genitourinary: Negative for dysuria, frequency, hematuria and flank pain.  Musculoskeletal: Positive for back pain. Negative for myalgias, joint pain, falls and neck pain.  Skin: Negative.   Neurological: Positive for dizziness.       Dizziness when standing for extended periods.  Endo/Heme/Allergies: Negative.   Psychiatric/Behavioral: Negative.   All other systems reviewed and are negative.  14 point ROS was done and is otherwise as  detailed above or in HPI    PHYSICAL EXAMINATION: ECOG PERFORMANCE STATUS: 1 - Symptomatic but completely ambulatory  Filed Vitals:   12/25/14 0952  BP: 90/62  Pulse: 102  Temp: 97.6 F (36.4 C)  Resp: 18   Filed Weights   12/25/14 0952  Weight: 183 lb 1.6 oz (83.054 kg)  Physical Exam  Constitutional: He is oriented to person, place, and time and well-developed, well-nourished, and in no distress.  Robust for age  HENT:  Head: Normocephalic and atraumatic.  Nose: Nose normal.  Mouth/Throat: Oropharynx is clear and moist. No oropharyngeal exudate.  Eyes: Conjunctivae and EOM are normal. Pupils are equal, round, and reactive to light. Right eye exhibits no discharge. Left eye exhibits no discharge. No scleral icterus.  Neck: Normal range of motion. Neck supple. No tracheal deviation present. No thyromegaly present.  Cardiovascular: Normal rate, regular rhythm and normal heart sounds.  Exam reveals no gallop and no friction rub.   No murmur heard. Pulmonary/Chest: Effort normal and breath sounds normal. He has no wheezes. He has no rales.  Abdominal: Soft. Bowel sounds are normal. He  exhibits no distension and no mass. There is no tenderness. There is no rebound and no guarding.  Urostomy/colostomy sites intact  Musculoskeletal: Normal range of motion. He exhibits no edema.  Lymphadenopathy:    He has no cervical adenopathy.  Neurological: He is alert and oriented to person, place, and time. He has normal reflexes. No cranial nerve deficit. Gait normal. Coordination normal.  Skin: Skin is warm and dry. No rash noted.  Psychiatric: Mood, memory, affect and judgment normal.  Nursing note and vitals reviewed.   LABORATORY DATA:  I have reviewed the data as listed Results for BAKER, MORONTA (MRN [id number])   Ref. Range 12/15/2014 04:11 12/16/2014 04:17  Sodium Latest Ref Range: 135-145 mmol/L 135 137  Potassium Latest Ref Range: 3.5-5.1 mmol/L 3.7 3.5  Chloride Latest Ref Range: 101-111 mmol/L 106 105  CO2 Latest Ref Range: 22-32 mmol/L 24 25  BUN Latest Ref Range: 6-20 mg/dL 14 14  Creatinine Latest Ref Range: 0.61-1.24 mg/dL 0.86 0.85  Calcium Latest Ref Range: 8.9-10.3 mg/dL 7.8 (L) 8.3 (L)  EGFR (Non-African Amer.) Latest Ref Range: >60 mL/min >60 >60  EGFR (African American) Latest Ref Range: >60 mL/min >60 >60  Glucose Latest Ref Range: 65-99 mg/dL 102 (H) 114 (H)  Anion gap Latest Ref Range: 5-15  5 7   WBC Latest Ref Range: 4.0-10.5 K/uL 10.6 (H)   RBC Latest Ref Range: 4.22-5.81 MIL/uL 2.89 (L)   Hemoglobin Latest Ref Range: 13.0-17.0 g/dL 9.3 (L)   HCT Latest Ref Range: 39.0-52.0 % 27.1 (L)   MCV Latest Ref Range: 78.0-100.0 fL 93.8   MCH Latest Ref Range: 26.0-34.0 pg 32.2   MCHC Latest Ref Range: 30.0-36.0 g/dL 34.3   RDW Latest Ref Range: 11.5-15.5 % 13.4   Platelets Latest Ref Range: 150-400 K/uL 202    PATHOLOGY:      RADIOGRAPHIC STUDIES: I have personally reviewed the radiological images as listed and agreed with the findings in the report. CLINICAL DATA: Patient with newly diagnosed rectal carcinoma. Remote history of prostate cancer  in 2001.  EXAM: CT CHEST, ABDOMEN, AND PELVIS WITH CONTRAST  TECHNIQUE: Multidetector CT imaging of the chest, abdomen and pelvis was performed following the standard protocol during bolus administration of intravenous contrast.  CONTRAST: 135m OMNIPAQUE IOHEXOL 300 MG/ML SOLN  COMPARISON: Bone scan 09/19/2013  FINDINGS: CT CHEST FINDINGS  Mediastinum/Lymph Nodes: Visualized thyroid  is unremarkable. No enlarged axillary, mediastinal or hilar lymphadenopathy. The heart is normal in size. No pericardial effusion. Aorta and main pulmonary artery normal in caliber. Coronary arterial vascular calcifications.  Lungs/Pleura: Central airways are patent. There are scattered calcified and noncalcified pulmonary nodules throughout the lungs bilaterally. Reference nodules are as follows: 6 mm left lower lobe nodule (image 47; series 3) ; adjacent nodules within the left fissure measuring 5 mm and 3 mm (image 31, 30 ; series 3); 3 mm right upper lobe nodule (image 32; series 3); 4 mm right lower lobe nodule (image 44; series 3). Dependent atelectasis within the bilateral lower lobes. No pleural effusion or pneumothorax. Bandlike opacity within the left lower lobe.  CT ABDOMEN PELVIS FINDINGS  Hepatobiliary: The liver is normal size and contour. No definite focal hepatic lesion small densities within the gallbladder lumen may represent small gallstones.  Pancreas: Unremarkable  Spleen: Unremarkable  Adrenals/Urinary Tract: Normal adrenal glands. Kidneys enhance symmetrically with contrast. There is a 2.3 cm exophytic cyst off the superior pole of the left kidney. Off of the inferior pole of the left kidney there is an 11 mm low-attenuation lesion within internal density greater that fluid (image 22; series 602). 10 mm low-attenuation lesion inferior pole right kidney (image 76; series 2) with an internal density greater than that of fluid.  Stomach/Bowel: No  evidence for bowel obstruction. Oral contrast material demonstrated to the level of the colon. Duodenum diverticulum. There is eccentric wall thickening of rectum (image 125; series 2), most compatible with recently diagnosed rectal carcinoma. Tiny adjacent perirectal lymph nodes, subcentimeter in size.  Vascular/Lymphatic: Normal caliber abdominal aorta with peripheral calcified atherosclerotic plaque.  Other: Multiple seeds demonstrated within the prostate. Fat containing left inguinal hernia.  Musculoskeletal: Lumbar spine degenerative changes. No aggressive or acute appearing osseous lesions.  IMPRESSION: 1. Wall thickening of the rectum compatible with recently diagnosed rectal carcinoma. Tiny subcentimeter perirectal lymph nodes. 2. Multiple bilateral pulmonary nodules as above. While these may be infectious or inflammatory in etiology, metastatic disease in the setting of known rectal carcinoma is not excluded. 3. Nodular soft tissue within the left mainstem bronchus which is nonspecific and may represent mucus however endobronchial nodule is not excluded. Recommend either attention on followup or correlation with bronchoscopy if the patient is having respiratory symptoms. 4. No evidence for focal hepatic lesion. 5. Bilateral low-attenuation renal lesions with internal densities greater than that of fluid, indeterminate. Recommend attention on followup or definitive characterization with pre and post contrast-enhanced MRI. These results will be called to the ordering clinician or representative by the Radiologist Assistant, and communication documented in the PACS or zVision Dashboard.   Electronically Signed  By: Lovey Newcomer M.D.  On: 10/08/2014 16:59      ASSESSMENT & PLAN:  Stage IIIB adenocarcinoma of the rectum Prostate Cancer treated with bracytherapy CT imaging with pulmonary nodules (too small for biopsy or PET imaging) CT with bilateral  low-attenuation renal lesions with internal densities greater than that of fluid, indeterminate Robotic assisted APR omentoplasty, cysto, prostatectomy, cystectomy, colon conduit, bilateral LN dissection, stents and urostomy on 12/11/2014 T3/T4 N0 M0 rectal carcinoma with 20 LN examined MMR IHC preserved Prostate with adenocarcinoma, gleason score 4+4 = 8   I have made adjustments to his BP medications today. I advised him that we can monitor his blood pressure moving forward. I anticipate that as he recovers he will eventually get back on his previous medications.  Hopefully adjusting his current meds will help with  his dizziness. BP checks in the clinic are consistent with systolics in the 02'M.  Mr. Homan will return in a few weeks when he is healed from surgery to discuss potential Xeloda treatment vs. observation.  I again reviewed guidelines regarding appropriate options moving forward in regards to appropriate management of his malignancies.   All questions were answered. The patient knows to call the clinic with any problems, questions or concerns.   This document serves as a record of services personally performed by Ancil Linsey, MD. It was created on her behalf by Arlyce Harman, a trained medical scribe. The creation of this record is based on the scribe's personal observations and the provider's statements to them. This document has been checked and approved by the attending provider.  I have reviewed the above documentation for accuracy and completeness, and I agree with the above.  This note was electronically signed.   Kelby Fam. Whitney Muse, MD

## 2015-01-02 ENCOUNTER — Other Ambulatory Visit (HOSPITAL_COMMUNITY)
Admission: RE | Admit: 2015-01-02 | Discharge: 2015-01-02 | Disposition: A | Discharge status: Home / Self Care | Payer: Medicare HMO | Source: Ambulatory Visit | Attending: General Surgery | Admitting: General Surgery

## 2015-01-02 DIAGNOSIS — C20 Malignant neoplasm of rectum: Secondary | ICD-10-CM | POA: Diagnosis present

## 2015-01-08 ENCOUNTER — Encounter (HOSPITAL_COMMUNITY): Payer: Self-pay

## 2015-01-15 ENCOUNTER — Ambulatory Visit (HOSPITAL_COMMUNITY): Payer: Medicare HMO | Admitting: Hematology & Oncology

## 2015-01-16 ENCOUNTER — Encounter (HOSPITAL_BASED_OUTPATIENT_CLINIC_OR_DEPARTMENT_OTHER): Payer: Medicare HMO | Admitting: Hematology & Oncology

## 2015-01-16 ENCOUNTER — Encounter (HOSPITAL_COMMUNITY): Payer: Self-pay | Admitting: Hematology & Oncology

## 2015-01-16 VITALS — BP 133/64 | HR 83 | Temp 98.5°F | Resp 16 | Wt 185.4 lb

## 2015-01-16 DIAGNOSIS — Z8546 Personal history of malignant neoplasm of prostate: Secondary | ICD-10-CM | POA: Diagnosis not present

## 2015-01-16 DIAGNOSIS — C20 Malignant neoplasm of rectum: Secondary | ICD-10-CM | POA: Diagnosis not present

## 2015-01-16 DIAGNOSIS — R918 Other nonspecific abnormal finding of lung field: Secondary | ICD-10-CM

## 2015-01-16 DIAGNOSIS — N2889 Other specified disorders of kidney and ureter: Secondary | ICD-10-CM

## 2015-01-16 DIAGNOSIS — R911 Solitary pulmonary nodule: Secondary | ICD-10-CM | POA: Diagnosis not present

## 2015-01-16 DIAGNOSIS — D62 Acute posthemorrhagic anemia: Secondary | ICD-10-CM

## 2015-01-16 NOTE — Progress Notes (Signed)
[place] [facility] NOTE  Patient Care Team: Redmond School, MD as PCP - General (Internal Medicine)  CHIEF COMPLAINTS/PURPOSE OF CONSULTATION:  Stage IIIB rectal adenocarcinoma Colonoscopy on 10/02/2014 with one third circumferential medium mass found in the distal rectum, ulcerated, firm anterior over prostate bed with multiple biopsies, final pathology c/w adenocarcinoma GI primary CT of the chest abdomen and pelvis on 10/08/2014 showing wall thickening of the rectum, multiple bilateral pulmonary nodules, nodular soft tissue within the left main stem bronchus which is nonspecific, no evidence of focal hepatic lesion, bilateral low attenuation renal lesions with internal densities greater than that of fluid, indeterminate History of prostate cancer treated with brachytherapy, followed by Dr. Jeffie Pollock  EUS on 10/10/2014 with final staging uT3 uN1b, stage IIIB rectal adenocarcinoma along the anterior wall of the distal rectum with distal edge 1-2 cm from the anal verge  12/11/2014 cystoscopy, bilateral pyelograms, insertion of bilateral externalized ureteral stent, robotic assisted laparoscopic radical cystoprostatectomy with bilateral pelvic lymphadenectomy and colon conduit urinary diversion with Dr. Alexis Frock and Dr. Leighton Ruff  Final pathology T3 vs. T4b (tumor board) N0M0 adenocarcinoma of rectum  HISTORY OF PRESENTING ILLNESS:  Ian Christensen 81 y.o. male is here for follow up of rectal carcinoma.  He is recovering well from his surgery. He notes that he feels much better and is back at home. [facility] with his daughter. He remarks that his blood pressure is better, and that he feels more like himself. He states "I feel good, really."  In terms of appetite, he says he's eating pretty good. He ate at Christmas, saying: "It's hard to turn good food down."  He feels that he's doing well with all of the paraphernalia from his  surgeries, including his colostomy bag and urostomy bag. He mentions that he can now change the bags on his own. He says that on Friday he changed them both by himself, and that it went okay. He remarks "I pay attention to what's going on." With regards to the learning process of changing the bags, his daughter says that it really depends on who comes out to help him. Sometimes one home health nurse makes him change the bags himself, and sometimes one of them helps him. His daughter says that they need to focus on one because "they're different;" (the urostomy bag vs the colostomy bag).   He's been back to see Dr. Marcello Moores, and she took the drain out. She told him that the lymph nodes were negative, and didn't know that there would be any benefit of any additional treatment. While consulting with Ian Christensen, she said that if it was her family member, she didn't know if she would recommend doing anything further. Ian Christensen states that he feels the same way. He follows up with Dr. Marcello Moores in the middle of January.   He denies any swelling in his legs or any pain in his belly. He confirms getting up every day, getting dressed, and going around about his regular business.  He indicates his left side abdomen and says "Is this supposed to be all swollen?" He comments that it does not hurt or bother him. His daughter states that Ian Christensen was taken off of his fluid pill.  Everything else looks great on Ian Christensen. Overall, ROS questioning is negative.  [facility] is out of network on their insurance, he is currently using [employer].    MEDICAL HISTORY:  Past Medical History  Diagnosis Date  . High cholesterol   . HTN (hypertension)   . Anal fissure   . Arthritis   . Colon polyps   . GERD (gastroesophageal reflux disease)   . Prostate cancer ([place])     in remission 2001. (seed implants)  . Rectal cancer ([place]) 10/07/2014  . Cataract     left eye   . Tinnitus   . Imbalance   . History of  measles   . History of hiatal hernia     SURGICAL HISTORY: Past Surgical History  Procedure Laterality Date  . Appendectomy  1955  . Colonoscopy      removes 1 polp  . Eus N/A 10/10/2014    Procedure: LOWER ENDOSCOPIC ULTRASOUND (EUS);  Surgeon: Milus Banister, MD;  Location: [facility];  Service: Endoscopy;  Laterality: N/A;  . Xi robotic assisted lower anterior resection N/A 12/11/2014    Procedure: XI ROBOTIC ASSISTED ABDOMINAL PERINEAL RESECTION OMENTOPLASTY;  Surgeon: Leighton Ruff, MD;  Location: [facility];  Service: General;  Laterality: N/A;  . Cysto N/A 12/11/2014    Procedure:  CYSTO,  PROSTATECTOMY, CYSTECTOMY colon conduit, BILATERAL LYMPH NODE DISSECTION;  Surgeon: Alexis Frock, MD;  Location: [facility];  Service: Urology;  Laterality: N/A;    SOCIAL HISTORY: Social History   Social History  . Marital Status: Married    Spouse Name: N/A  . Number of Children: 2  . Years of Education: N/A   Occupational History  . retired    Social History Main Topics  . Smoking status: Never Smoker   . Smokeless tobacco: Never Used  . Alcohol Use: 0.0 oz/week    0 Standard drinks or equivalent per week     Comment: rarely  . Drug Use: No  . Sexual Activity: Not on file   Other Topics Concern  . Not on file   Social History Narrative  2 children, 3 grandchildren, 1 expecting great grandchild Married Previously employed as a Psychologist, sport and exercise, tobacco raising Non-smoker ETOH, none Hobbies included golfing.  He no longer plays   FAMILY HISTORY: Family History  Problem Relation Age of Onset  . Colon cancer Sister   . Prostate cancer Brother   . Diabetes Mother   . Heart disease Mother   . Heart disease Father   . Colon polyps Daughter     x 2   indicated that his mother is deceased. He indicated that his father is deceased. He indicated that his sister is deceased. He indicated that his brother is deceased.  Mother deceased, 48 Father deceased, 40, heart attack 6 brothers,  3 sisters.  1 brother deceased from prostate cancer 1 sister deceased from colon cancer (50) 1 brother deceased from heart attack  ALLERGIES:  has No Known Allergies.  MEDICATIONS:  Current Outpatient Prescriptions  Medication Sig Dispense Refill  . Acetaminophen (TYLENOL PO) Take 1 tablet by mouth.    Marland Kitchen ascorbic acid (VITAMIN C) 500 MG tablet Take 500 mg by mouth daily.    Marland Kitchen aspirin EC 81 MG tablet Take 162 mg by mouth at bedtime.     Marland Kitchen losartan (COZAAR) 50 MG tablet Take 25 mg by mouth daily.     . metoprolol succinate (TOPROL-XL) 50 MG 24 hr tablet Take 25 mg by mouth 2 (two) times daily. Take with or immediately following a meal.    . niacin 500 MG CR capsule Take 500 mg by mouth at bedtime.    Marland Kitchen omeprazole (PRILOSEC) 20 MG  capsule Take 1 capsule by mouth daily.    . simvastatin (ZOCOR) 20 MG tablet Take 20 mg by mouth every evening.    . vitamin B-12 (CYANOCOBALAMIN) 1000 MCG tablet Take 1,000 mcg by mouth daily.    . hydrochlorothiazide (HYDRODIURIL) 25 MG tablet Take 12.5 mg by mouth daily. Reported on 01/16/2015     No current facility-administered medications for this visit.    Review of Systems  Constitutional: Negative.   HENT: Negative.   Eyes: Negative.   Respiratory: Negative.   Cardiovascular: Negative.   Gastrointestinal: Negative. Genitourinary: Negative for urgency. Negative for dysuria, frequency, hematuria and flank pain.  Musculoskeletal: Positive for back pain. Negative for myalgias, joint pain, falls and neck pain.  Skin: Negative.   Neurological: Negative.   Endo/Heme/Allergies: Negative.   Psychiatric/Behavioral: Negative.   All other systems reviewed and are negative.  14 point ROS was done and is otherwise as detailed above or in HPI    PHYSICAL EXAMINATION: ECOG PERFORMANCE STATUS: 1 - Symptomatic but completely ambulatory  Filed Vitals:   01/16/15 1047  BP: 133/64  Pulse: 83  Temp: 98.5 F (36.9 C)  Resp: 16   Filed Weights   01/16/15  1047  Weight: 185 lb 6.4 oz (84.097 kg)    Physical Exam  Constitutional: He is oriented to person, place, and time and well-developed, well-nourished, and in no distress.  Robust for age  HENT:  Head: Normocephalic and atraumatic.  Nose: Nose normal.  Mouth/Throat: Oropharynx is clear and moist. No oropharyngeal exudate.  Eyes: Conjunctivae and EOM are normal. Pupils are equal, round, and reactive to light. Right eye exhibits no discharge. Left eye exhibits no discharge. No scleral icterus.  Neck: Normal range of motion. Neck supple. No tracheal deviation present. No thyromegaly present.  Cardiovascular: Normal rate, regular rhythm and normal heart sounds.  Exam reveals no gallop and no friction rub.   No murmur heard. Pulmonary/Chest: Effort normal and breath sounds normal. He has no wheezes. He has no rales.  Abdominal: Soft. Bowel sounds are normal. He exhibits no distension and no mass. There is no tenderness. There is no rebound and no guarding. R side/posterior back with mild swelling but no pain with palpation. Ostomy sites are intact Genitourinary:  Musculoskeletal: Normal range of motion. He exhibits no edema.  Lymphadenopathy:    He has no cervical adenopathy.  Neurological: He is alert and oriented to person, place, and time. He has normal reflexes. No cranial nerve deficit. Gait normal. Coordination normal.  Skin: Skin is warm and dry. No rash noted.  Psychiatric: Mood, memory, affect and judgment normal.  Nursing note and vitals reviewed.  LABORATORY DATA:  I have reviewed the data as listed Lab Results  Component Value Date   WBC 10.6* 12/15/2014   HGB 9.3* 12/15/2014   HCT 27.1* 12/15/2014   MCV 93.8 12/15/2014   PLT 202 12/15/2014    PATHOLOGY:          RADIOGRAPHIC STUDIES: I have personally reviewed the radiological images as listed and agreed with the findings in the report. CLINICAL DATA: Patient with newly diagnosed rectal carcinoma. Remote  history of prostate cancer in 2001.  EXAM: CT CHEST, ABDOMEN, AND PELVIS WITH CONTRAST  TECHNIQUE: Multidetector CT imaging of the chest, abdomen and pelvis was performed following the standard protocol during bolus administration of intravenous contrast.  CONTRAST: 115mL OMNIPAQUE IOHEXOL 300 MG/ML SOLN  COMPARISON: Bone scan 09/19/2013  FINDINGS: CT CHEST FINDINGS  Mediastinum/Lymph Nodes: Visualized thyroid is  unremarkable. No enlarged axillary, mediastinal or hilar lymphadenopathy. The heart is normal in size. No pericardial effusion. Aorta and main pulmonary artery normal in caliber. Coronary arterial vascular calcifications.  Lungs/Pleura: Central airways are patent. There are scattered calcified and noncalcified pulmonary nodules throughout the lungs bilaterally. Reference nodules are as follows: 6 mm left lower lobe nodule (image 47; series 3) ; adjacent nodules within the left fissure measuring 5 mm and 3 mm (image 31, 30 ; series 3); 3 mm right upper lobe nodule (image 32; series 3); 4 mm right lower lobe nodule (image 44; series 3). Dependent atelectasis within the bilateral lower lobes. No pleural effusion or pneumothorax. Bandlike opacity within the left lower lobe.  CT ABDOMEN PELVIS FINDINGS  Hepatobiliary: The liver is normal size and contour. No definite focal hepatic lesion small densities within the gallbladder lumen may represent small gallstones.  Pancreas: Unremarkable  Spleen: Unremarkable  Adrenals/Urinary Tract: Normal adrenal glands. Kidneys enhance symmetrically with contrast. There is a 2.3 cm exophytic cyst off the superior pole of the left kidney. Off of the inferior pole of the left kidney there is an 11 mm low-attenuation lesion within internal density greater that fluid (image 22; series 602). 10 mm low-attenuation lesion inferior pole right kidney (image 76; series 2) with an internal density greater than that of  fluid.  Stomach/Bowel: No evidence for bowel obstruction. Oral contrast material demonstrated to the level of the colon. Duodenum diverticulum. There is eccentric wall thickening of rectum (image 125; series 2), most compatible with recently diagnosed rectal carcinoma. Tiny adjacent perirectal lymph nodes, subcentimeter in size.  Vascular/Lymphatic: Normal caliber abdominal aorta with peripheral calcified atherosclerotic plaque.  Other: Multiple seeds demonstrated within the prostate. Fat containing left inguinal hernia.  Musculoskeletal: Lumbar spine degenerative changes. No aggressive or acute appearing osseous lesions.  IMPRESSION: 1. Wall thickening of the rectum compatible with recently diagnosed rectal carcinoma. Tiny subcentimeter perirectal lymph nodes. 2. Multiple bilateral pulmonary nodules as above. While these may be infectious or inflammatory in etiology, metastatic disease in the setting of known rectal carcinoma is not excluded. 3. Nodular soft tissue within the left mainstem bronchus which is nonspecific and may represent mucus however endobronchial nodule is not excluded. Recommend either attention on followup or correlation with bronchoscopy if the patient is having respiratory symptoms. 4. No evidence for focal hepatic lesion. 5. Bilateral low-attenuation renal lesions with internal densities greater than that of fluid, indeterminate. Recommend attention on followup or definitive characterization with pre and post contrast-enhanced MRI. These results will be called to the ordering clinician or representative by the Radiologist Assistant, and communication documented in the PACS or zVision Dashboard.   Electronically Signed  By: Lovey Newcomer M.D.  On: 10/08/2014 16:59    ASSESSMENT & PLAN:  Stage IIB adenocarcinoma of the rectum Prostate Cancer treated with bracytherapy CT imaging with pulmonary nodules (too small for biopsy or PET imaging) CT  with bilateral low-attenuation renal lesions with internal densities greater than that of fluid, indeterminate S/P definitive surgical therapy with colostomy/urostomy   We discussed that observation is not unreasonable.  We briefly addressed XELODA therapy, I advised the patient I prefer a 7 on/7 off schedule as it is much better tolerated.  We have opted on observation.  I would like to repeat scans in the future for follow-up of the pulmonary nodules. These are felt to be benign.   He looks good today.  He has had his flu vaccination.  All questions were answered. The patient knows  to call the clinic with any problems, questions or concerns.   This document serves as a record of services personally performed by Ancil Linsey, MD. It was created on her behalf by Toni Amend, a trained medical scribe. The creation of this record is based on the scribe's personal observations and the provider's statements to them. This document has been checked and approved by the attending provider.  I have reviewed the above documentation for accuracy and completeness, and I agree with the above.  This note was electronically signed.   Kelby Fam. Whitney Muse, MD

## 2015-01-16 NOTE — Patient Instructions (Addendum)
Ian Christensen at [facility] Discharge Instructions  RECOMMENDATIONS MADE BY THE CONSULTANT AND ANY TEST RESULTS WILL BE SENT TO YOUR REFERRING PHYSICIAN.   Exam completed by Dr Whitney Muse today You can take a chemotherapy pill called xeloda this is completely up to you.  Observation is also key  Return to see the doctor end of February  We will order scans then (around summer time) Please call the clinic if you have any questions or concerns    Thank you for choosing [place] at [facility] to provide your oncology and hematology care.  To afford each patient quality time with our provider, please arrive at least 15 minutes before your scheduled appointment time.    You need to re-schedule your appointment should you arrive 10 or more minutes late.  We strive to give you quality time with our providers, and arriving late affects you and other patients whose appointments are after yours.  Also, if you no show three or more times for appointments you may be dismissed from the clinic at the providers discretion.     Again, thank you for choosing [facility].  Our hope is that these requests will decrease the amount of time that you wait before being seen by our physicians.       _____________________________________________________________  Should you have questions after your visit to [facility], please contact our office at [phone] between the hours of 8:30 a.m. and 4:30 p.m.  Voicemails left after 4:30 p.m. will not be returned until the following business day.  For prescription refill requests, have your pharmacy contact our office.       Capecitabine tablets What is this medicine? CAPECITABINE (ka pe SITE a been) is a chemotherapy drug. It slows the growth of cancer cells. This medicine is used to treat breast cancer, and also colon or rectal cancer. This medicine may be used for other purposes; ask your  health care provider or pharmacist if you have questions. What should I tell my health care provider before I take this medicine? They need to know if you have any of these conditions: -bleeding or blood disorders -dihydropyrimidine dehydrogenase (DPD) deficiency -heart disease -infection (especially a virus infection such as chickenpox, cold sores, or herpes) -kidney disease -liver disease -an unusual or allergic reaction to capecitabine, 5-fluorouracil, other medicines, foods, dyes, or preservatives -pregnant or trying to get pregnant -breast-feeding How should I use this medicine? Take this medicine by mouth with a glass of water, within 30 minutes of the end of a meal. Do not cut, crush or chew this medicine. Follow the directions on the prescription label. Take your medicine at regular intervals. Do not take it more often than directed. Do not stop taking except on your doctor's advice. Your doctor may want you to take a combination of 150 mg and 500 mg tablets for each dose. It is very important that you know how to correctly take your dose. Taking the wrong tablets could result in an overdose (too much medication) or underdose (too little medication). Talk to your pediatrician regarding the use of this medicine in children. Special care may be needed. Overdosage: If you think you have taken too much of this medicine contact a poison control center or emergency room at once. NOTE: This medicine is only for you. Do not share this medicine with others. What if I miss a dose? If you miss a dose, do not take  the missed dose at all. Do not take double or extra doses. Instead, continue with your next scheduled dose and check with your doctor. What may interact with this medicine? -antacids with aluminum and/or magnesium -folic acid -leucovorin -medicines to increase blood counts like filgrastim, pegfilgrastim, sargramostim -phenytoin -vaccines -warfarin Talk to your doctor or health care  professional before taking any of these medicines: -acetaminophen -aspirin -ibuprofen -ketoprofen -naproxen This list may not describe all possible interactions. Give your health care provider a list of all the medicines, herbs, non-prescription drugs, or dietary supplements you use. Also tell them if you smoke, drink alcohol, or use illegal drugs. Some items may interact with your medicine. What should I watch for while using this medicine? Visit your doctor for checks on your progress. This drug may make you feel generally unwell. This is not uncommon, as chemotherapy can affect healthy cells as well as cancer cells. Report any side effects. Continue your course of treatment even though you feel ill unless your doctor tells you to stop. In some cases, you may be given additional medicines to help with side effects. Follow all directions for their use. Call your doctor or health care professional for advice if you get a fever, chills or sore throat, or other symptoms of a cold or flu. Do not treat yourself. This drug decreases your body's ability to fight infections. Try to avoid being around people who are sick. This medicine may increase your risk to bruise or bleed. Call your doctor or health care professional if you notice any unusual bleeding. Be careful brushing and flossing your teeth or using a toothpick because you may get an infection or bleed more easily. If you have any dental work done, tell your dentist you are receiving this medicine. Avoid taking products that contain aspirin, acetaminophen, ibuprofen, naproxen, or ketoprofen unless instructed by your doctor. These medicines may hide a fever. Do not become pregnant while taking this medicine. Women should inform their doctor if they wish to become pregnant or think they might be pregnant. There is a potential for serious side effects to an unborn child. Talk to your health care professional or pharmacist for more information. Do not  breast-feed an infant while taking this medicine. Men are advised not to father a child while taking this medicine. What side effects may I notice from receiving this medicine? Side effects that you should report to your doctor or health care professional as soon as possible: -allergic reactions like skin rash, itching or hives, swelling of the face, lips, or tongue -low blood counts - this medicine may decrease the number of white blood cells, red blood cells and platelets. You may be at increased risk for infections and bleeding. -signs of infection - fever or chills, cough, sore throat, pain or difficulty passing urine -signs of decreased platelets or bleeding - bruising, pinpoint red spots on the skin, black, tarry stools, blood in the urine -signs of decreased red blood cells - unusually weak or tired, fainting spells, lightheadedness -breathing problems -changes in vision -chest pain -dark urine -diarrhea of more than 4 bowel movements in one day or any diarrhea at night; bloody or watery diarrhea -dizziness -mouth sores -nausea and vomiting -pain, tingling, numbness in the hands or feet -redness, swelling, or sores on hands or feet -stomach pain -vomiting -yellow color of skin or eyes Side effects that usually do not require medical attention (report to your doctor or health care professional if they continue or are bothersome): -constipation -  diarrhea -dry or itchy skin -hair loss -loss of appetite -nausea -weak or tired This list may not describe all possible side effects. Call your doctor for medical advice about side effects. You may report side effects to FDA at 1-800-FDA-1088. Where should I keep my medicine? Keep out of the reach of children. Store at room temperature between 15 and 30 degrees C (59 and 86 degrees F). Keep container tightly closed. Throw away any unused medicine after the expiration date. NOTE: This sheet is a summary. It may not cover all possible  information. If you have questions about this medicine, talk to your doctor, pharmacist, or health care provider.    2016, [employer]. (2014-02-18 17:03:30)

## 2015-01-31 DIAGNOSIS — C61 Malignant neoplasm of prostate: Secondary | ICD-10-CM | POA: Diagnosis not present

## 2015-01-31 DIAGNOSIS — Z6828 Body mass index (BMI) 28.0-28.9, adult: Secondary | ICD-10-CM | POA: Diagnosis not present

## 2015-01-31 DIAGNOSIS — Z436 Encounter for attention to other artificial openings of urinary tract: Secondary | ICD-10-CM | POA: Diagnosis not present

## 2015-01-31 DIAGNOSIS — Z433 Encounter for attention to colostomy: Secondary | ICD-10-CM | POA: Diagnosis not present

## 2015-01-31 DIAGNOSIS — C20 Malignant neoplasm of rectum: Secondary | ICD-10-CM | POA: Diagnosis not present

## 2015-01-31 DIAGNOSIS — I10 Essential (primary) hypertension: Secondary | ICD-10-CM | POA: Diagnosis not present

## 2015-02-04 ENCOUNTER — Other Ambulatory Visit: Payer: Self-pay | Admitting: Urology

## 2015-02-04 DIAGNOSIS — N281 Cyst of kidney, acquired: Secondary | ICD-10-CM

## 2015-02-25 DIAGNOSIS — C61 Malignant neoplasm of prostate: Secondary | ICD-10-CM | POA: Diagnosis not present

## 2015-02-25 DIAGNOSIS — I10 Essential (primary) hypertension: Secondary | ICD-10-CM | POA: Diagnosis not present

## 2015-02-25 DIAGNOSIS — C20 Malignant neoplasm of rectum: Secondary | ICD-10-CM | POA: Diagnosis not present

## 2015-02-25 DIAGNOSIS — Z6828 Body mass index (BMI) 28.0-28.9, adult: Secondary | ICD-10-CM | POA: Diagnosis not present

## 2015-02-25 DIAGNOSIS — Z436 Encounter for attention to other artificial openings of urinary tract: Secondary | ICD-10-CM | POA: Diagnosis not present

## 2015-02-25 DIAGNOSIS — Z433 Encounter for attention to colostomy: Secondary | ICD-10-CM | POA: Diagnosis not present

## 2015-03-04 DIAGNOSIS — Z6828 Body mass index (BMI) 28.0-28.9, adult: Secondary | ICD-10-CM | POA: Diagnosis not present

## 2015-03-04 DIAGNOSIS — Z436 Encounter for attention to other artificial openings of urinary tract: Secondary | ICD-10-CM | POA: Diagnosis not present

## 2015-03-04 DIAGNOSIS — C61 Malignant neoplasm of prostate: Secondary | ICD-10-CM | POA: Diagnosis not present

## 2015-03-04 DIAGNOSIS — Z433 Encounter for attention to colostomy: Secondary | ICD-10-CM | POA: Diagnosis not present

## 2015-03-04 DIAGNOSIS — C20 Malignant neoplasm of rectum: Secondary | ICD-10-CM | POA: Diagnosis not present

## 2015-03-04 DIAGNOSIS — I10 Essential (primary) hypertension: Secondary | ICD-10-CM | POA: Diagnosis not present

## 2015-03-11 DIAGNOSIS — H2513 Age-related nuclear cataract, bilateral: Secondary | ICD-10-CM | POA: Diagnosis not present

## 2015-03-14 ENCOUNTER — Encounter (HOSPITAL_COMMUNITY): Payer: Medicare HMO | Attending: Hematology & Oncology | Admitting: Hematology & Oncology

## 2015-03-14 VITALS — BP 145/62 | HR 63 | Temp 97.3°F | Resp 18 | Wt 182.6 lb

## 2015-03-14 DIAGNOSIS — D649 Anemia, unspecified: Secondary | ICD-10-CM | POA: Diagnosis not present

## 2015-03-14 DIAGNOSIS — Z8546 Personal history of malignant neoplasm of prostate: Secondary | ICD-10-CM

## 2015-03-14 DIAGNOSIS — C20 Malignant neoplasm of rectum: Secondary | ICD-10-CM

## 2015-03-14 DIAGNOSIS — R918 Other nonspecific abnormal finding of lung field: Secondary | ICD-10-CM | POA: Diagnosis not present

## 2015-03-14 DIAGNOSIS — C61 Malignant neoplasm of prostate: Secondary | ICD-10-CM | POA: Insufficient documentation

## 2015-03-14 NOTE — Progress Notes (Signed)
[facility] [place] NOTE  Patient Care Team: Redmond School, MD as PCP - General (Internal Medicine)  CHIEF COMPLAINTS/PURPOSE OF CONSULTATION:  Stage IIIB rectal adenocarcinoma Colonoscopy on 10/02/2014 with one third circumferential medium mass found in the distal rectum, ulcerated, firm anterior over prostate bed with multiple biopsies, final pathology c/w adenocarcinoma GI primary CT of the chest abdomen and pelvis on 10/08/2014 showing wall thickening of the rectum, multiple bilateral pulmonary nodules, nodular soft tissue within the left main stem bronchus which is nonspecific, no evidence of focal hepatic lesion, bilateral low attenuation renal lesions with internal densities greater than that of fluid, indeterminate History of prostate cancer treated with brachytherapy, followed by Dr. Jeffie Pollock  EUS on 10/10/2014 with final staging uT3 uN1b, stage IIIB rectal adenocarcinoma along the anterior wall of the distal rectum with distal edge 1-2 cm from the anal verge  12/11/2014 cystoscopy, bilateral pyelograms, insertion of bilateral externalized ureteral stent, robotic assisted laparoscopic radical cystoprostatectomy with bilateral pelvic lymphadenectomy and colon conduit urinary diversion with Dr. Alexis Frock and Dr. Leighton Ruff  Final pathology T3 vs. T4b (tumor board) N0M0 adenocarcinoma of rectum  HISTORY OF PRESENTING ILLNESS:  Ian Christensen 82 y.o. male is here for follow up of rectal carcinoma.  He is recovering well from his surgery. He notes that he feels much better and is back at home.  Mr. Ian Christensen returns to the [facility] today accompanied by his daughter. He is able to ambulate easily to the exam table.  He says that, realistically, he's feeling great. Honestly, with the bags, he says he's adjusting "good." He says that the bags can be aggravating because his belt "cuts the drainage circulation off." He says he thinks he is going to get  some suspenders to help with this issue. He confirms he is at home and that everything is going well. When asked if he has any questions or concerns, he says no. He has not had any issues getting his ostomy supplies, and remarks that the nurse has one more visit, but they've left them with a "book."  He says that his blood pressure has stayed pretty good. He says he had one little episode at home when he hadn't eaten and hadn't taken his pills. He says he hasn't done that ever since.  He hasn't had any falls, nausea, or vomiting. He notes that his strength is "getting better," adding that sometimes he feels wobbly.   His daughter says that he's feeling so good that he's been thinking about calling [place] to play some golf.  He has a new great-grandson; his daughter's oldest daughter gave birth on Tuesday.   His daughter notes that he will have a renal ultrasound in July. He denies SOB, CP.  MEDICAL HISTORY:  Past Medical History  Diagnosis Date  . High cholesterol   . HTN (hypertension)   . Anal fissure   . Arthritis   . Colon polyps   . GERD (gastroesophageal reflux disease)   . Prostate cancer ([place])     in remission 2001. (seed implants)  . Rectal cancer ([place]) 10/07/2014  . Cataract     left eye   . Tinnitus   . Imbalance   . History of measles   . History of hiatal hernia     SURGICAL HISTORY: Past Surgical History  Procedure Laterality Date  . Appendectomy  1955  . Colonoscopy      removes 1 polp  . Eus N/A 10/10/2014  Procedure: LOWER ENDOSCOPIC ULTRASOUND (EUS);  Surgeon: Milus Banister, MD;  Location: [facility];  Service: Endoscopy;  Laterality: N/A;  . Xi robotic assisted lower anterior resection N/A 12/11/2014    Procedure: XI ROBOTIC ASSISTED ABDOMINAL PERINEAL RESECTION OMENTOPLASTY;  Surgeon: Leighton Ruff, MD;  Location: [facility];  Service: General;  Laterality: N/A;  . Cysto N/A 12/11/2014    Procedure:  CYSTO,  PROSTATECTOMY, CYSTECTOMY colon conduit,  BILATERAL LYMPH NODE DISSECTION;  Surgeon: Alexis Frock, MD;  Location: [facility];  Service: Urology;  Laterality: N/A;    SOCIAL HISTORY: Social History   Social History  . Marital Status: Married    Spouse Name: N/A  . Number of Children: 2  . Years of Education: N/A   Occupational History  . retired    Social History Main Topics  . Smoking status: Never Smoker   . Smokeless tobacco: Never Used  . Alcohol Use: 0.0 oz/week    0 Standard drinks or equivalent per week     Comment: rarely  . Drug Use: No  . Sexual Activity: Not on file   Other Topics Concern  . Not on file   Social History Narrative  2 children, 3 grandchildren, 1 expecting great grandchild Married Previously employed as a Psychologist, sport and exercise, tobacco raising Non-smoker ETOH, none Hobbies included golfing.  He no longer plays   FAMILY HISTORY: Family History  Problem Relation Age of Onset  . Colon cancer Sister   . Prostate cancer Brother   . Diabetes Mother   . Heart disease Mother   . Heart disease Father   . Colon polyps Daughter     x 2   indicated that his mother is deceased. He indicated that his father is deceased. He indicated that his sister is deceased. He indicated that his brother is deceased.  Mother deceased, 78 Father deceased, 3, heart attack 6 brothers, 3 sisters.  1 brother deceased from prostate cancer 1 sister deceased from colon cancer (98) 1 brother deceased from heart attack  ALLERGIES:  has No Known Allergies.  MEDICATIONS:  Current Outpatient Prescriptions  Medication Sig Dispense Refill  . Acetaminophen (TYLENOL PO) Take 1 tablet by mouth.    Marland Kitchen ascorbic acid (VITAMIN C) 500 MG tablet Take 500 mg by mouth daily.    Marland Kitchen aspirin EC 81 MG tablet Take 162 mg by mouth at bedtime.     . hydrochlorothiazide (HYDRODIURIL) 25 MG tablet Take 12.5 mg by mouth daily. Reported on 01/16/2015    . losartan (COZAAR) 50 MG tablet Take 25 mg by mouth daily.     . metoprolol succinate (TOPROL-XL) 50  MG 24 hr tablet Take 25 mg by mouth 2 (two) times daily. Take with or immediately following a meal.    . niacin 500 MG CR capsule Take 500 mg by mouth at bedtime.    Marland Kitchen omeprazole (PRILOSEC) 20 MG capsule Take 1 capsule by mouth daily.    . simvastatin (ZOCOR) 20 MG tablet Take 20 mg by mouth every evening.    . vitamin B-12 (CYANOCOBALAMIN) 1000 MCG tablet Take 1,000 mcg by mouth daily.     No current facility-administered medications for this visit.   Review of Systems  Constitutional: Negative.   HENT: Negative.   Eyes: Negative.   Respiratory: Negative.   Cardiovascular: Negative.   Gastrointestinal: Negative. Genitourinary: Negative for urgency. Negative for dysuria, frequency, hematuria and flank pain.  Musculoskeletal: Positive for back pain. Negative for myalgias, joint pain, falls and neck pain.  Skin: Negative.   Neurological: Negative.   Endo/Heme/Allergies: Negative.   Psychiatric/Behavioral: Negative.   All other systems reviewed and are negative.  14 point ROS was done and is otherwise as detailed above or in HPI   PHYSICAL EXAMINATION: ECOG PERFORMANCE STATUS: 1 - Symptomatic but completely ambulatory  Filed Vitals:   03/14/15 1247  BP: 145/62  Pulse: 63  Temp: 97.3 F (36.3 C)  Resp: 18   Filed Weights   03/14/15 1247  Weight: 182 lb 9.6 oz (82.827 kg)    Physical Exam  Constitutional: He is oriented to person, place, and time and well-developed, well-nourished, and in no distress.  Robust for age  HENT:  Head: Normocephalic and atraumatic.  Nose: Nose normal.  Mouth/Throat: Oropharynx is clear and moist. No oropharyngeal exudate.  Eyes: Conjunctivae and EOM are normal. Pupils are equal, round, and reactive to light. Right eye exhibits no discharge. Left eye exhibits no discharge. No scleral icterus.  Neck: Normal range of motion. Neck supple. No tracheal deviation present. No thyromegaly present.  Cardiovascular: Normal rate, regular rhythm and  normal heart sounds.  Exam reveals no gallop and no friction rub.   No murmur heard. Pulmonary/Chest: Effort normal and breath sounds normal. He has no wheezes. He has no rales.  Abdominal: Soft. Bowel sounds are normal. He exhibits no distension and no mass. There is no tenderness. There is no rebound and no guarding. R side/posterior back with mild swelling but no pain with palpation. Ostomy sites are intact Genitourinary:  Musculoskeletal: Normal range of motion. He exhibits no edema.  Lymphadenopathy:    He has no cervical adenopathy.  Neurological: He is alert and oriented to person, place, and time. He has normal reflexes. No cranial nerve deficit. Gait normal. Coordination normal.  Skin: Skin is warm and dry. No rash noted.  Psychiatric: Mood, memory, affect and judgment normal.  Nursing note and vitals reviewed.  LABORATORY DATA:  I have reviewed the data as listed Lab Results  Component Value Date   WBC 10.6* 12/15/2014   HGB 9.3* 12/15/2014   HCT 27.1* 12/15/2014   MCV 93.8 12/15/2014   PLT 202 12/15/2014   Results for JENTRY, WARNELL (MRN [id number])   Ref. Range 12/16/2014 04:17  Sodium Latest Ref Range: 135-145 mmol/L 137  Potassium Latest Ref Range: 3.5-5.1 mmol/L 3.5  Chloride Latest Ref Range: 101-111 mmol/L 105  CO2 Latest Ref Range: 22-32 mmol/L 25  BUN Latest Ref Range: 6-20 mg/dL 14  Creatinine Latest Ref Range: 0.61-1.24 mg/dL 0.85  Calcium Latest Ref Range: 8.9-10.3 mg/dL 8.3 (L)  EGFR (Non-African Amer.) Latest Ref Range: >60 mL/min >60  EGFR (African American) Latest Ref Range: >60 mL/min >60  Glucose Latest Ref Range: 65-99 mg/dL 114 (H)  Anion gap Latest Ref Range: 5-15  7   PATHOLOGY:          RADIOGRAPHIC STUDIES: I have personally reviewed the radiological images as listed and agreed with the findings in the report. CLINICAL DATA: Patient with newly diagnosed rectal carcinoma. Remote history of prostate cancer in 2001.  EXAM: CT  CHEST, ABDOMEN, AND PELVIS WITH CONTRAST  TECHNIQUE: Multidetector CT imaging of the chest, abdomen and pelvis was performed following the standard protocol during bolus administration of intravenous contrast.  CONTRAST: 163m OMNIPAQUE IOHEXOL 300 MG/ML SOLN  COMPARISON: Bone scan 09/19/2013  FINDINGS: CT CHEST FINDINGS  Mediastinum/Lymph Nodes: Visualized thyroid is unremarkable. No enlarged axillary, mediastinal or hilar lymphadenopathy. The heart is normal in size. No pericardial effusion.  Aorta and main pulmonary artery normal in caliber. Coronary arterial vascular calcifications.  Lungs/Pleura: Central airways are patent. There are scattered calcified and noncalcified pulmonary nodules throughout the lungs bilaterally. Reference nodules are as follows: 6 mm left lower lobe nodule (image 47; series 3) ; adjacent nodules within the left fissure measuring 5 mm and 3 mm (image 31, 30 ; series 3); 3 mm right upper lobe nodule (image 32; series 3); 4 mm right lower lobe nodule (image 44; series 3). Dependent atelectasis within the bilateral lower lobes. No pleural effusion or pneumothorax. Bandlike opacity within the left lower lobe.  CT ABDOMEN PELVIS FINDINGS  Hepatobiliary: The liver is normal size and contour. No definite focal hepatic lesion small densities within the gallbladder lumen may represent small gallstones.  Pancreas: Unremarkable  Spleen: Unremarkable  Adrenals/Urinary Tract: Normal adrenal glands. Kidneys enhance symmetrically with contrast. There is a 2.3 cm exophytic cyst off the superior pole of the left kidney. Off of the inferior pole of the left kidney there is an 11 mm low-attenuation lesion within internal density greater that fluid (image 22; series 602). 10 mm low-attenuation lesion inferior pole right kidney (image 76; series 2) with an internal density greater than that of fluid.  Stomach/Bowel: No evidence for bowel  obstruction. Oral contrast material demonstrated to the level of the colon. Duodenum diverticulum. There is eccentric wall thickening of rectum (image 125; series 2), most compatible with recently diagnosed rectal carcinoma. Tiny adjacent perirectal lymph nodes, subcentimeter in size.  Vascular/Lymphatic: Normal caliber abdominal aorta with peripheral calcified atherosclerotic plaque.  Other: Multiple seeds demonstrated within the prostate. Fat containing left inguinal hernia.  Musculoskeletal: Lumbar spine degenerative changes. No aggressive or acute appearing osseous lesions.  IMPRESSION: 1. Wall thickening of the rectum compatible with recently diagnosed rectal carcinoma. Tiny subcentimeter perirectal lymph nodes. 2. Multiple bilateral pulmonary nodules as above. While these may be infectious or inflammatory in etiology, metastatic disease in the setting of known rectal carcinoma is not excluded. 3. Nodular soft tissue within the left mainstem bronchus which is nonspecific and may represent mucus however endobronchial nodule is not excluded. Recommend either attention on followup or correlation with bronchoscopy if the patient is having respiratory symptoms. 4. No evidence for focal hepatic lesion. 5. Bilateral low-attenuation renal lesions with internal densities greater than that of fluid, indeterminate. Recommend attention on followup or definitive characterization with pre and post contrast-enhanced MRI. These results will be called to the ordering clinician or representative by the Radiologist Assistant, and communication documented in the PACS or zVision Dashboard.   Electronically Signed  By: Lovey Newcomer M.D.  On: 10/08/2014 16:59    ASSESSMENT & PLAN:  Stage IIB adenocarcinoma of the rectum Prostate Cancer treated with bracytherapy CT imaging with pulmonary nodules (too small for biopsy or PET imaging) CT with bilateral low-attenuation renal lesions  with internal densities greater than that of fluid, indeterminate S/P definitive surgical therapy with colostomy/urostomy Anemia   We discussed that observation is not unreasonable.  We briefly addressed XELODA therapy, I advised the patient I prefer a 7 on/7 off schedule as it is much better tolerated.  We have opted on observation.  I would like to repeat scans in the future for follow-up of the pulmonary nodules. These are felt to be benign.   We discussed how the issue at hand now is surveillance. His anemia will be followed and if persistent will undergo further evaluation and work up. If he is doing great in 3 months, we will  set up imaging, and may move him out to 4 month intervals. Then we will continue following up according to guidelines.  Orders Placed This Encounter  Procedures  . CBC with Differential    Standing Status: Future     Number of Occurrences:      Standing Expiration Date: 03/13/2016  . Comprehensive metabolic panel    Standing Status: Future     Number of Occurrences:      Standing Expiration Date: 03/13/2016    All questions were answered. The patient knows to call the clinic with any problems, questions or concerns.   This document serves as a record of services personally performed by Ancil Linsey, MD. It was created on her behalf by Toni Amend, a trained medical scribe. The creation of this record is based on the scribe's personal observations and the provider's statements to them. This document has been checked and approved by the attending provider.  I have reviewed the above documentation for accuracy and completeness, and I agree with the above.  This note was electronically signed.   Kelby Fam. Whitney Muse, MD

## 2015-03-14 NOTE — Patient Instructions (Addendum)
[place] at [facility] Discharge Instructions  RECOMMENDATIONS MADE BY THE CONSULTANT AND ANY TEST RESULTS WILL BE SENT TO YOUR REFERRING PHYSICIAN.    Exam and discussion by Dr Whitney Muse today We will talk about setting up imaging the next time you are here.  Return to see the doctor in 3 months with labs Please call the clinic if you have any questions or concerns    Thank you for choosing [place] at [facility] to provide your oncology and hematology care.  To afford each patient quality time with our provider, please arrive at least 15 minutes before your scheduled appointment time.   Beginning January 23rd 2017 lab work for the [facility] will be done in the  Main lab at [facility] on 1st floor. If you have a lab appointment with the [facility] please come in thru the  Main Entrance and check in at the main information desk  You need to re-schedule your appointment should you arrive 10 or more minutes late.  We strive to give you quality time with our providers, and arriving late affects you and other patients whose appointments are after yours.  Also, if you no show three or more times for appointments you may be dismissed from the clinic at the providers discretion.     Again, thank you for choosing [facility].  Our hope is that these requests will decrease the amount of time that you wait before being seen by our physicians.       _____________________________________________________________  Should you have questions after your visit to [facility], please contact our office at [phone] between the hours of 8:30 a.m. and 4:30 p.m.  Voicemails left after 4:30 p.m. will not be returned until the following business day.  For prescription refill requests, have your pharmacy contact our office.

## 2015-03-22 DIAGNOSIS — Z6828 Body mass index (BMI) 28.0-28.9, adult: Secondary | ICD-10-CM | POA: Diagnosis not present

## 2015-03-22 DIAGNOSIS — I10 Essential (primary) hypertension: Secondary | ICD-10-CM | POA: Diagnosis not present

## 2015-03-22 DIAGNOSIS — Z433 Encounter for attention to colostomy: Secondary | ICD-10-CM | POA: Diagnosis not present

## 2015-03-22 DIAGNOSIS — C20 Malignant neoplasm of rectum: Secondary | ICD-10-CM | POA: Diagnosis not present

## 2015-03-22 DIAGNOSIS — Z436 Encounter for attention to other artificial openings of urinary tract: Secondary | ICD-10-CM | POA: Diagnosis not present

## 2015-03-22 DIAGNOSIS — C61 Malignant neoplasm of prostate: Secondary | ICD-10-CM | POA: Diagnosis not present

## 2015-03-30 ENCOUNTER — Encounter (HOSPITAL_COMMUNITY): Payer: Self-pay | Admitting: Hematology & Oncology

## 2015-04-14 DIAGNOSIS — C20 Malignant neoplasm of rectum: Secondary | ICD-10-CM | POA: Diagnosis not present

## 2015-04-14 DIAGNOSIS — Z936 Other artificial openings of urinary tract status: Secondary | ICD-10-CM | POA: Diagnosis not present

## 2015-04-14 DIAGNOSIS — Z933 Colostomy status: Secondary | ICD-10-CM | POA: Diagnosis not present

## 2015-04-17 DIAGNOSIS — Z933 Colostomy status: Secondary | ICD-10-CM | POA: Diagnosis not present

## 2015-04-17 DIAGNOSIS — C20 Malignant neoplasm of rectum: Secondary | ICD-10-CM | POA: Diagnosis not present

## 2015-04-17 DIAGNOSIS — Z936 Other artificial openings of urinary tract status: Secondary | ICD-10-CM | POA: Diagnosis not present

## 2015-05-13 DIAGNOSIS — Z936 Other artificial openings of urinary tract status: Secondary | ICD-10-CM | POA: Diagnosis not present

## 2015-05-13 DIAGNOSIS — C20 Malignant neoplasm of rectum: Secondary | ICD-10-CM | POA: Diagnosis not present

## 2015-05-13 DIAGNOSIS — Z933 Colostomy status: Secondary | ICD-10-CM | POA: Diagnosis not present

## 2015-06-02 DIAGNOSIS — Z936 Other artificial openings of urinary tract status: Secondary | ICD-10-CM | POA: Diagnosis not present

## 2015-06-02 DIAGNOSIS — Z933 Colostomy status: Secondary | ICD-10-CM | POA: Diagnosis not present

## 2015-06-02 DIAGNOSIS — C20 Malignant neoplasm of rectum: Secondary | ICD-10-CM | POA: Diagnosis not present

## 2015-06-11 DIAGNOSIS — C20 Malignant neoplasm of rectum: Secondary | ICD-10-CM | POA: Diagnosis not present

## 2015-06-11 DIAGNOSIS — Z936 Other artificial openings of urinary tract status: Secondary | ICD-10-CM | POA: Diagnosis not present

## 2015-06-11 DIAGNOSIS — Z933 Colostomy status: Secondary | ICD-10-CM | POA: Diagnosis not present

## 2015-06-12 ENCOUNTER — Encounter (HOSPITAL_COMMUNITY): Payer: PPO

## 2015-06-12 ENCOUNTER — Encounter (HOSPITAL_COMMUNITY): Payer: Self-pay | Admitting: Hematology & Oncology

## 2015-06-12 ENCOUNTER — Encounter (HOSPITAL_COMMUNITY): Payer: PPO | Attending: Hematology & Oncology | Admitting: Hematology & Oncology

## 2015-06-12 VITALS — BP 151/65 | HR 62 | Temp 98.1°F | Resp 16 | Wt 180.2 lb

## 2015-06-12 DIAGNOSIS — C61 Malignant neoplasm of prostate: Secondary | ICD-10-CM

## 2015-06-12 DIAGNOSIS — Z8546 Personal history of malignant neoplasm of prostate: Secondary | ICD-10-CM | POA: Diagnosis not present

## 2015-06-12 DIAGNOSIS — R918 Other nonspecific abnormal finding of lung field: Secondary | ICD-10-CM | POA: Diagnosis not present

## 2015-06-12 DIAGNOSIS — C20 Malignant neoplasm of rectum: Secondary | ICD-10-CM

## 2015-06-12 LAB — COMPREHENSIVE METABOLIC PANEL
ALBUMIN: 4.2 g/dL (ref 3.5–5.0)
ALK PHOS: 49 U/L (ref 38–126)
ALT: 14 U/L — ABNORMAL LOW (ref 17–63)
ANION GAP: 5 (ref 5–15)
AST: 21 U/L (ref 15–41)
BUN: 19 mg/dL (ref 6–20)
CALCIUM: 9.4 mg/dL (ref 8.9–10.3)
CO2: 26 mmol/L (ref 22–32)
Chloride: 107 mmol/L (ref 101–111)
Creatinine, Ser: 1.11 mg/dL (ref 0.61–1.24)
GFR calc non Af Amer: >60 mL/min (ref >60)
GLUCOSE: 94 mg/dL (ref 65–99)
POTASSIUM: 4.3 mmol/L (ref 3.5–5.1)
SODIUM: 138 mmol/L (ref 135–145)
TOTAL PROTEIN: 7 g/dL (ref 6.5–8.1)
Total Bilirubin: 0.6 mg/dL (ref 0.3–1.2)

## 2015-06-12 LAB — CBC WITH DIFFERENTIAL/PLATELET
BASOS PCT: 1 %
Basophils Absolute: 0.1 10*3/uL (ref 0.0–0.1)
EOS ABS: 0.3 10*3/uL (ref 0.0–0.7)
EOS PCT: 3 %
HCT: 44.6 % (ref 39.0–52.0)
HEMOGLOBIN: 14.7 g/dL (ref 13.0–17.0)
LYMPHS ABS: 2.7 10*3/uL (ref 0.7–4.0)
Lymphocytes Relative: 33 %
MCH: 30.5 pg (ref 26.0–34.0)
MCHC: 33 g/dL (ref 30.0–36.0)
MCV: 92.5 fL (ref 78.0–100.0)
MONO ABS: 0.7 10*3/uL (ref 0.1–1.0)
MONOS PCT: 9 %
NEUTROS PCT: 54 %
Neutro Abs: 4.5 10*3/uL (ref 1.7–7.7)
Platelets: 201 10*3/uL (ref 150–400)
RBC: 4.82 MIL/uL (ref 4.22–5.81)
RDW: 14.3 % (ref 11.5–15.5)
WBC: 8.3 10*3/uL (ref 4.0–10.5)

## 2015-06-12 NOTE — Patient Instructions (Addendum)
[place] at [facility] Discharge Instructions  RECOMMENDATIONS MADE BY THE CONSULTANT AND ANY TEST RESULTS WILL BE SENT TO YOUR REFERRING PHYSICIAN.  Wound ostomy nurse @ [facility] [phone]  Return to clinic in 4 months with labs  CT scans next week     Thank you for choosing [place] at [facility] to provide your oncology and hematology care.  To afford each patient quality time with our provider, please arrive at least 15 minutes before your scheduled appointment time.   Beginning January 23rd 2017 lab work for the [facility] will be done in the  Main lab at [facility] on 1st floor. If you have a lab appointment with the [facility] please come in thru the  Main Entrance and check in at the main information desk  You need to re-schedule your appointment should you arrive 10 or more minutes late.  We strive to give you quality time with our providers, and arriving late affects you and other patients whose appointments are after yours.  Also, if you no show three or more times for appointments you may be dismissed from the clinic at the providers discretion.     Again, thank you for choosing [facility].  Our hope is that these requests will decrease the amount of time that you wait before being seen by our physicians.       _____________________________________________________________  Should you have questions after your visit to [facility], please contact our office at [phone] between the hours of 8:30 a.m. and 4:30 p.m.  Voicemails left after 4:30 p.m. will not be returned until the following business day.  For prescription refill requests, have your pharmacy contact our office.         Resources For Cancer Patients and their Caregivers ? American Cancer Society: Can assist with transportation, wigs, general needs, runs Look Good Feel Better.        (785)881-7017 ? Cancer Care: Provides  financial assistance, online support groups, medication/co-pay assistance.  1-800-813-HOPE (812)054-4439) ? [place] Assists [place] cancer patients and their families through emotional , educational and financial support.  (848) 806-2771 ? [place] DSS Where to apply for food stamps, Medicaid and utility assistance. 954 278 1873 ? [facility]: Transportation to medical appointments. 757 860 7319 ? Social Security Administration: May apply for disability if have a Stage IV cancer. 704-085-5599 705-642-6718 ? [employer], Disability and Transit Services: Assists with nutrition, care and transit needs. [place] Support Programs: @10RELATIVEDAYS @ > Cancer Support Group  2nd Tuesday of the month 1pm-2pm, Journey Room  > Creative Journey  3rd Tuesday of the month 1130am-1pm, Journey Room  > Look Good Feel Better  1st Wednesday of the month 10am-12 noon, Journey Room (Call [place] to register [phone]) [phone])

## 2015-06-12 NOTE — Progress Notes (Signed)
[facility] NOTE  Patient Care Team: Redmond School, MD as PCP - General (Internal Medicine)  CHIEF COMPLAINTS/PURPOSE OF CONSULTATION:  Stage IIIB rectal adenocarcinoma Colonoscopy on 10/02/2014 with one third circumferential medium mass found in the distal rectum, ulcerated, firm anterior over prostate bed with multiple biopsies, final pathology c/w adenocarcinoma GI primary CT of the chest abdomen and pelvis on 10/08/2014 showing wall thickening of the rectum, multiple bilateral pulmonary nodules, nodular soft tissue within the left main stem bronchus which is nonspecific, no evidence of focal hepatic lesion, bilateral low attenuation renal lesions with internal densities greater than that of fluid, indeterminate History of prostate cancer treated with brachytherapy, followed by Dr. Jeffie Pollock  EUS on 10/10/2014 with final staging uT3 uN1b, stage IIIB rectal adenocarcinoma along the anterior wall of the distal rectum with distal edge 1-2 cm from the anal verge  12/11/2014 cystoscopy, bilateral pyelograms, insertion of bilateral externalized ureteral stent, robotic assisted laparoscopic radical cystoprostatectomy with bilateral pelvic lymphadenectomy and colon conduit urinary diversion with Dr. Alexis Frock and Dr. Leighton Ruff  Final pathology T3 vs. T4b (tumor board) N0M0 adenocarcinoma of rectum  HISTORY OF PRESENTING ILLNESS:  Ian Christensen 82 y.o. male is here for follow up of rectal carcinoma.    Ian Christensen returns to the [facility] today accompanied by his daughter. He says he's been doing all right; been doing good. He says he's playing golf now, for about a month now.  He says he's doing well changing his bags, but notes "the ring keeps melting and coming off." He has to keep taking care of that, but other than that things are going okay. He notes that he is going to have to find out about other options. He notes that he puts it on to his  stomach right, so he's confused as to why it's messing up. He thinks it's "the heat from something," his body melting it, or something like that. He does not see the ostomy nurses anymore. Home Health is totally done.  He says he also stays up late at night playing cards, and wonders if this is okay. He notes that they gamble dollar bills when they play cards, and that sometimes he bets on horses. He went to the casino sometime after his surgery and had to have the paramedics called on him because his blood pressure got so low.  He notes that his bowel movements occur "all at one time." He will go several days with nothing and then suddenly it comes "all at one time continuously." He notes that he was thinking about taking stool softeners more regularly to see if that helps and works, but has not tried it yet. He notes that he will try a half pill of Colace to help him go every day, instead of this "all at one time." He knows that he is going to have to experiment to see what works for him. He admits that he hasn't has the appetite as a result of everything getting kind of backed up as it has been. He also notes that it prevents him from going out sometimes because he has to "go home and change."  He denies any pain. No nausea or vomiting. No blood in his stool or urine. No SOB or cough.   MEDICAL HISTORY:  Past Medical History  Diagnosis Date  . High cholesterol   . HTN (hypertension)   . Anal fissure   . Arthritis   .  Colon polyps   . GERD (gastroesophageal reflux disease)   . Prostate cancer ([place])     in remission 2001. (seed implants)  . Rectal cancer ([facility]) 10/07/2014  . Cataract     left eye   . Tinnitus   . Imbalance   . History of measles   . History of hiatal hernia     SURGICAL HISTORY: Past Surgical History  Procedure Laterality Date  . Appendectomy  1955  . Colonoscopy      removes 1 polp  . Eus N/A 10/10/2014    Procedure: LOWER ENDOSCOPIC ULTRASOUND (EUS);  Surgeon:  Milus Banister, MD;  Location: [facility];  Service: Endoscopy;  Laterality: N/A;  . Xi robotic assisted lower anterior resection N/A 12/11/2014    Procedure: XI ROBOTIC ASSISTED ABDOMINAL PERINEAL RESECTION OMENTOPLASTY;  Surgeon: Leighton Ruff, MD;  Location: [facility];  Service: General;  Laterality: N/A;  . Cysto N/A 12/11/2014    Procedure:  CYSTO,  PROSTATECTOMY, CYSTECTOMY colon conduit, BILATERAL LYMPH NODE DISSECTION;  Surgeon: Alexis Frock, MD;  Location: [facility];  Service: Urology;  Laterality: N/A;    SOCIAL HISTORY: Social History   Social History  . Marital Status: Married    Spouse Name: N/A  . Number of Children: 2  . Years of Education: N/A   Occupational History  . retired    Social History Main Topics  . Smoking status: Never Smoker   . Smokeless tobacco: Never Used  . Alcohol Use: 0.0 oz/week    0 Standard drinks or equivalent per week     Comment: rarely  . Drug Use: No  . Sexual Activity: Not on file   Other Topics Concern  . Not on file   Social History Narrative  2 children, 3 grandchildren, 1 expecting great grandchild Married Previously employed as a Psychologist, sport and exercise, tobacco raising Non-smoker ETOH, none Hobbies included golfing.  He no longer plays   FAMILY HISTORY: Family History  Problem Relation Age of Onset  . Colon cancer Sister   . Prostate cancer Brother   . Diabetes Mother   . Heart disease Mother   . Heart disease Father   . Colon polyps Daughter     x 2   indicated that his mother is deceased. He indicated that his father is deceased. He indicated that his sister is deceased. He indicated that his brother is deceased.  Mother deceased, 52 Father deceased, 39, heart attack 6 brothers, 3 sisters.  1 brother deceased from prostate cancer 1 sister deceased from colon cancer (45) 1 brother deceased from heart attack  ALLERGIES:  has No Known Allergies.  MEDICATIONS:  Current Outpatient Prescriptions  Medication Sig Dispense Refill  .  Acetaminophen (TYLENOL PO) Take 1 tablet by mouth.    Marland Kitchen ascorbic acid (VITAMIN C) 500 MG tablet Take 500 mg by mouth daily.    Marland Kitchen aspirin EC 81 MG tablet Take 162 mg by mouth at bedtime.     Marland Kitchen losartan (COZAAR) 50 MG tablet Take 25 mg by mouth daily.     . metoprolol succinate (TOPROL-XL) 50 MG 24 hr tablet Take 25 mg by mouth 2 (two) times daily. Take with or immediately following a meal.    . niacin 500 MG CR capsule Take 500 mg by mouth at bedtime.    Marland Kitchen omeprazole (PRILOSEC) 20 MG capsule Take 1 capsule by mouth daily.    . simvastatin (ZOCOR) 20 MG tablet Take 20 mg by mouth every evening.    Marland Kitchen  VITAMIN D, CHOLECALCIFEROL, PO Take 500 mg by mouth daily.    . vitamin B-12 (CYANOCOBALAMIN) 1000 MCG tablet Take 1,000 mcg by mouth daily. Reported on 06/12/2015     No current facility-administered medications for this visit.   Review of Systems  Constitutional: Negative.   HENT: Negative.   Eyes: Negative.   Respiratory: Negative.   Cardiovascular: Negative.   Gastrointestinal: Negative. Genitourinary: Negative for urgency. Negative for dysuria, frequency, hematuria and flank pain.  Musculoskeletal: Positive for back pain. Negative for myalgias, joint pain, falls and neck pain.  Skin: Negative.   Neurological: Negative.   Endo/Heme/Allergies: Negative.   Psychiatric/Behavioral: Negative.   All other systems reviewed and are negative.  14 point ROS was done and is otherwise as detailed above or in HPI   PHYSICAL EXAMINATION: ECOG PERFORMANCE STATUS: 1 - Symptomatic but completely ambulatory  Filed Vitals:   06/12/15 1306  BP: 151/65  Pulse: 62  Temp: 98.1 F (36.7 C)  Resp: 16   Filed Weights   06/12/15 1306  Weight: 180 lb 3.2 oz (81.738 kg)    Physical Exam  Constitutional: He is oriented to person, place, and time and well-developed, well-nourished, and in no distress.  Robust for age  HENT:  Head: Normocephalic and atraumatic.  Nose: Nose normal.  Mouth/Throat:  Oropharynx is clear and moist. No oropharyngeal exudate.  Eyes: Conjunctivae and EOM are normal. Pupils are equal, round, and reactive to light. Right eye exhibits no discharge. Left eye exhibits no discharge. No scleral icterus.  Neck: Normal range of motion. Neck supple. No tracheal deviation present. No thyromegaly present.  Cardiovascular: Normal rate, regular rhythm and normal heart sounds.  Exam reveals no gallop and no friction rub.   No murmur heard. Pulmonary/Chest: Effort normal and breath sounds normal. He has no wheezes. He has no rales.  Abdominal: Soft. Bowel sounds are normal. He exhibits no distension and no mass. There is no tenderness. There is no rebound and no guarding.  Ostomy sites are intact normal appearing urine and stool Genitourinary:  Musculoskeletal: Normal range of motion. He exhibits no edema.  Lymphadenopathy:    He has no cervical adenopathy.  Neurological: He is alert and oriented to person, place, and time. He has normal reflexes. No cranial nerve deficit. Gait normal. Coordination normal.  Skin: Skin is warm and dry. No rash noted.  Psychiatric: Mood, memory, affect and judgment normal.  Nursing note and vitals reviewed.  LABORATORY DATA:  I have reviewed the data as listed  Results for MCKENNA, BORUFF (MRN [id number]) as of 06/12/2015 13:43  Ref. Range 06/12/2015 12:25  Sodium Latest Ref Range: 135-145 mmol/L 138  Potassium Latest Ref Range: 3.5-5.1 mmol/L 4.3  Chloride Latest Ref Range: 101-111 mmol/L 107  CO2 Latest Ref Range: 22-32 mmol/L 26  BUN Latest Ref Range: 6-20 mg/dL 19  Creatinine Latest Ref Range: 0.61-1.24 mg/dL 1.11  Calcium Latest Ref Range: 8.9-10.3 mg/dL 9.4  EGFR (Non-African Amer.) Latest Ref Range: >60 mL/min >60  EGFR (African American) Latest Ref Range: >60 mL/min >60  Glucose Latest Ref Range: 65-99 mg/dL 94  Anion gap Latest Ref Range: 5-15  5  Alkaline Phosphatase Latest Ref Range: 38-126 U/L 49  Albumin Latest Ref Range:  3.5-5.0 g/dL 4.2  AST Latest Ref Range: 15-41 U/L 21  ALT Latest Ref Range: 17-63 U/L 14 (L)  Total Protein Latest Ref Range: 6.5-8.1 g/dL 7.0  Total Bilirubin Latest Ref Range: 0.3-1.2 mg/dL 0.6  WBC Latest Ref Range: 4.0-10.5 K/uL  8.3  RBC Latest Ref Range: 4.22-5.81 MIL/uL 4.82  Hemoglobin Latest Ref Range: 13.0-17.0 g/dL 14.7  HCT Latest Ref Range: 39.0-52.0 % 44.6  MCV Latest Ref Range: 78.0-100.0 fL 92.5  MCH Latest Ref Range: 26.0-34.0 pg 30.5  MCHC Latest Ref Range: 30.0-36.0 g/dL 33.0  RDW Latest Ref Range: 11.5-15.5 % 14.3  Platelets Latest Ref Range: 150-400 K/uL 201  Neutrophils Latest Units: % 54  Lymphocytes Latest Units: % 33  Monocytes Relative Latest Units: % 9  Eosinophil Latest Units: % 3  Basophil Latest Units: % 1  NEUT# Latest Ref Range: 1.7-7.7 K/uL 4.5  Lymphocyte # Latest Ref Range: 0.7-4.0 K/uL 2.7  Monocyte # Latest Ref Range: 0.1-1.0 K/uL 0.7  Eosinophils Absolute Latest Ref Range: 0.0-0.7 K/uL 0.3  Basophils Absolute Latest Ref Range: 0.0-0.1 K/uL 0.1    PATHOLOGY:           ASSESSMENT & PLAN:  Stage IIB adenocarcinoma of the rectum Prostate Cancer treated with bracytherapy Pulmonary nodules CT with bilateral low-attenuation renal lesions with internal densities greater than that of fluid, indeterminate S/P definitive surgical therapy with colostomy/urostomy Anemia   He is doing well. He is active and has begun playing golf again. He is currently having trouble with his colostomy. We are going to consult home health for additional assistance. I advised him to use a stool softener daily to see if this keeps him more regular and prevents him from having accidents. Weight is stable.  He needs re-imaging. We discussed follow-up of previously noted pulmonary nodules. I advised him that if stable imaging will be once yearly. He is agreeable. He will be notified of results when available.   CEA was not elevated pre-operatively and therefore  unlikely to be helpful for surveillance. We reviewed guidelines.   NCCN guidelines for surveillance for Colon cancer are as follows (1.2017):  B. Stage II, Stage III 1. H+P every 3-6 months x 2 years and then every 6 months for a total of 5 years  2. CEA every 3-6 months x 2 years and then every 6 months for a total of 5 years  3. CT CAP every 6-12 months (category 2B for frequency < 12 months) for a total of 5 years . 4.  Colonoscopy in 1 year except if no preoperative colonoscopy due to obstructing lesion, colonoscopy in 3-6 months.  A. If advanced adenoma, repeat in 1 year B. If no advanced adenoma, repeat in 3 years, then every 5 years 5. PET/CT scan is not recommended.  I will see him back in 3 months. We can discus repeat C-scope if he is agreeable. He would be due in August.   Orders Placed This Encounter  Procedures  . CT Abdomen Pelvis W Contrast    Standing Status: Future     Number of Occurrences:      Standing Expiration Date: 06/11/2016    Order Specific Question:  If indicated for the ordered procedure, I authorize the administration of contrast media per Radiology protocol    Answer:  Yes    Order Specific Question:  Reason for Exam (SYMPTOM  OR DIAGNOSIS REQUIRED)    Answer:  restaging CRC, pulm nodules    Order Specific Question:  Preferred imaging location?    Answer:  [facility]  . CT Chest W Contrast    Standing Status: Future     Number of Occurrences:      Standing Expiration Date: 06/11/2016    Order Specific Question:  If indicated  for the ordered procedure, I authorize the administration of contrast media per Radiology protocol    Answer:  Yes    Order Specific Question:  Reason for Exam (SYMPTOM  OR DIAGNOSIS REQUIRED)    Answer:  restaging CRC, pulm nodules    Order Specific Question:  Preferred imaging location?    Answer:  [facility]  . CEA    Standing Status: Future     Number of Occurrences:      Standing Expiration Date:  06/11/2016   All questions were answered. The patient knows to call the clinic with any problems, questions or concerns.   This document serves as a record of services personally performed by Ancil Linsey, MD. It was created on her behalf by Toni Amend, a trained medical scribe. The creation of this record is based on the scribe's personal observations and the provider's statements to them. This document has been checked and approved by the attending provider.  I have reviewed the above documentation for accuracy and completeness, and I agree with the above.  This note was electronically signed.   Kelby Fam. Whitney Muse, MD

## 2015-06-20 ENCOUNTER — Ambulatory Visit (HOSPITAL_COMMUNITY)
Admission: RE | Admit: 2015-06-20 | Discharge: 2015-06-20 | Disposition: A | Discharge status: Home / Self Care | Payer: PPO | Source: Ambulatory Visit | Attending: Hematology & Oncology | Admitting: Hematology & Oncology

## 2015-06-20 DIAGNOSIS — I700 Atherosclerosis of aorta: Secondary | ICD-10-CM | POA: Diagnosis not present

## 2015-06-20 DIAGNOSIS — C20 Malignant neoplasm of rectum: Secondary | ICD-10-CM | POA: Diagnosis not present

## 2015-06-20 DIAGNOSIS — Z933 Colostomy status: Secondary | ICD-10-CM | POA: Insufficient documentation

## 2015-06-20 DIAGNOSIS — N2889 Other specified disorders of kidney and ureter: Secondary | ICD-10-CM | POA: Diagnosis not present

## 2015-06-20 DIAGNOSIS — R918 Other nonspecific abnormal finding of lung field: Secondary | ICD-10-CM | POA: Insufficient documentation

## 2015-06-20 DIAGNOSIS — C61 Malignant neoplasm of prostate: Secondary | ICD-10-CM | POA: Insufficient documentation

## 2015-06-20 MED ORDER — IOPAMIDOL (ISOVUE-300) INJECTION 61%
100.0000 mL | Freq: Once | INTRAVENOUS | Status: AC | PRN
  Administered 2015-06-20: 100 mL via INTRAVENOUS

## 2015-06-23 DIAGNOSIS — Z85048 Personal history of other malignant neoplasm of rectum, rectosigmoid junction, and anus: Secondary | ICD-10-CM | POA: Diagnosis not present

## 2015-07-17 DIAGNOSIS — C20 Malignant neoplasm of rectum: Secondary | ICD-10-CM | POA: Diagnosis not present

## 2015-07-17 DIAGNOSIS — Z936 Other artificial openings of urinary tract status: Secondary | ICD-10-CM | POA: Diagnosis not present

## 2015-07-17 DIAGNOSIS — Z933 Colostomy status: Secondary | ICD-10-CM | POA: Diagnosis not present

## 2015-08-15 ENCOUNTER — Ambulatory Visit (HOSPITAL_COMMUNITY): Payer: PPO

## 2015-08-22 ENCOUNTER — Ambulatory Visit (HOSPITAL_COMMUNITY)
Admission: RE | Admit: 2015-08-22 | Discharge: 2015-08-22 | Disposition: A | Discharge status: Home / Self Care | Payer: PPO | Source: Ambulatory Visit | Attending: Urology | Admitting: Urology

## 2015-08-22 DIAGNOSIS — R935 Abnormal findings on diagnostic imaging of other abdominal regions, including retroperitoneum: Secondary | ICD-10-CM | POA: Insufficient documentation

## 2015-08-22 DIAGNOSIS — N281 Cyst of kidney, acquired: Secondary | ICD-10-CM

## 2015-09-09 DIAGNOSIS — K432 Incisional hernia without obstruction or gangrene: Secondary | ICD-10-CM | POA: Diagnosis not present

## 2015-09-18 DIAGNOSIS — Z1389 Encounter for screening for other disorder: Secondary | ICD-10-CM | POA: Diagnosis not present

## 2015-09-18 DIAGNOSIS — I10 Essential (primary) hypertension: Secondary | ICD-10-CM | POA: Diagnosis not present

## 2015-09-18 DIAGNOSIS — E663 Overweight: Secondary | ICD-10-CM | POA: Diagnosis not present

## 2015-09-18 DIAGNOSIS — Z8546 Personal history of malignant neoplasm of prostate: Secondary | ICD-10-CM | POA: Diagnosis not present

## 2015-09-18 DIAGNOSIS — Z936 Other artificial openings of urinary tract status: Secondary | ICD-10-CM | POA: Diagnosis not present

## 2015-09-18 DIAGNOSIS — E782 Mixed hyperlipidemia: Secondary | ICD-10-CM | POA: Diagnosis not present

## 2015-09-18 DIAGNOSIS — Z6828 Body mass index (BMI) 28.0-28.9, adult: Secondary | ICD-10-CM | POA: Diagnosis not present

## 2015-09-19 ENCOUNTER — Ambulatory Visit: Payer: PPO | Admitting: Urology

## 2015-09-25 DIAGNOSIS — C20 Malignant neoplasm of rectum: Secondary | ICD-10-CM | POA: Diagnosis not present

## 2015-09-25 DIAGNOSIS — Z936 Other artificial openings of urinary tract status: Secondary | ICD-10-CM | POA: Diagnosis not present

## 2015-09-25 DIAGNOSIS — Z933 Colostomy status: Secondary | ICD-10-CM | POA: Diagnosis not present

## 2015-10-06 DIAGNOSIS — C61 Malignant neoplasm of prostate: Secondary | ICD-10-CM | POA: Diagnosis not present

## 2015-10-10 ENCOUNTER — Ambulatory Visit (INDEPENDENT_AMBULATORY_CARE_PROVIDER_SITE_OTHER): Payer: PPO | Admitting: Urology

## 2015-10-10 DIAGNOSIS — Z8546 Personal history of malignant neoplasm of prostate: Secondary | ICD-10-CM | POA: Diagnosis not present

## 2015-10-10 DIAGNOSIS — N281 Cyst of kidney, acquired: Secondary | ICD-10-CM | POA: Diagnosis not present

## 2015-10-10 DIAGNOSIS — K432 Incisional hernia without obstruction or gangrene: Secondary | ICD-10-CM | POA: Diagnosis not present

## 2015-10-13 ENCOUNTER — Encounter (HOSPITAL_COMMUNITY): Payer: Self-pay | Admitting: Hematology & Oncology

## 2015-10-13 ENCOUNTER — Encounter (HOSPITAL_COMMUNITY): Payer: PPO

## 2015-10-13 ENCOUNTER — Encounter (HOSPITAL_COMMUNITY): Payer: PPO | Attending: Hematology & Oncology | Admitting: Hematology & Oncology

## 2015-10-13 VITALS — BP 154/67 | HR 66 | Temp 97.8°F | Resp 16 | Wt 187.2 lb

## 2015-10-13 DIAGNOSIS — C61 Malignant neoplasm of prostate: Secondary | ICD-10-CM | POA: Insufficient documentation

## 2015-10-13 DIAGNOSIS — C20 Malignant neoplasm of rectum: Secondary | ICD-10-CM | POA: Diagnosis not present

## 2015-10-13 DIAGNOSIS — R918 Other nonspecific abnormal finding of lung field: Secondary | ICD-10-CM | POA: Diagnosis not present

## 2015-10-13 DIAGNOSIS — M545 Low back pain, unspecified: Secondary | ICD-10-CM

## 2015-10-13 DIAGNOSIS — Z8546 Personal history of malignant neoplasm of prostate: Secondary | ICD-10-CM

## 2015-10-13 DIAGNOSIS — D649 Anemia, unspecified: Secondary | ICD-10-CM | POA: Diagnosis not present

## 2015-10-13 NOTE — Progress Notes (Signed)
[facility] [place] NOTE  Patient Care Team: Redmond School, MD as PCP - General (Internal Medicine)  CHIEF COMPLAINTS/PURPOSE OF CONSULTATION:  Stage IIIB rectal adenocarcinoma Colonoscopy on 10/02/2014 with one third circumferential medium mass found in the distal rectum, ulcerated, firm anterior over prostate bed with multiple biopsies, final pathology c/w adenocarcinoma GI primary CT of the chest abdomen and pelvis on 10/08/2014 showing wall thickening of the rectum, multiple bilateral pulmonary nodules, nodular soft tissue within the left main stem bronchus which is nonspecific, no evidence of focal hepatic lesion, bilateral low attenuation renal lesions with internal densities greater than that of fluid, indeterminate History of prostate cancer treated with brachytherapy, followed by Dr. Jeffie Pollock  EUS on 10/10/2014 with final staging uT3 uN1b, stage IIIB rectal adenocarcinoma along the anterior wall of the distal rectum with distal edge 1-2 cm from the anal verge  12/11/2014 cystoscopy, bilateral pyelograms, insertion of bilateral externalized ureteral stent, robotic assisted laparoscopic radical cystoprostatectomy with bilateral pelvic lymphadenectomy and colon conduit urinary diversion with Dr. Alexis Frock and Dr. Leighton Ruff  Final pathology T3 vs. T4b (tumor board) N0M0 adenocarcinoma of rectum  HISTORY OF PRESENTING ILLNESS:  Ian Christensen 82 y.o. male is here for follow up of rectal carcinoma.    Ian Christensen is unaccompanied.  He denies any further episodes of passing out.   He feels fine and has been playing golf as well as gambling. States he wins his golf games sometimes.  His hernia has not been causing him problems, "it goes up and goes down". He saw Dr. Marcello Moores about his hernia and was told to leave it be. He reports some lower back pain intermittently and he associates it with his hernia.  He has used occasional advil.   When asked why  he did not attend his scheduled colonoscopy in August, he states he wasn't contacted. Further stating, "I don't need no colonoscopy".   He denies any problems with his ostomies. He admits for a couple of weeks his bowels were regular, then this past week his bag began to fill all at once again. This has since resolved.   He reports weight gain, 7 lbs since 06/12/15. Appetite is good.   Since our last visit, his wife passed away. They were together for 62 years. She was in a nursing home but he notes that he thought she was gone when she got dementia, this is "even harder."   MEDICAL HISTORY:  Past Medical History:  Diagnosis Date  . Anal fissure   . Arthritis   . Cataract    left eye   . Colon polyps   . GERD (gastroesophageal reflux disease)   . High cholesterol   . History of hiatal hernia   . History of measles   . HTN (hypertension)   . Imbalance   . Prostate cancer ([place])    in remission 2001. (seed implants)  . Rectal cancer ([place]) 10/07/2014  . Tinnitus     SURGICAL HISTORY: Past Surgical History:  Procedure Laterality Date  . APPENDECTOMY  1955  . COLONOSCOPY     removes 1 polp  . CYSTO N/A 12/11/2014   Procedure:  CYSTO,  PROSTATECTOMY, CYSTECTOMY colon conduit, BILATERAL LYMPH NODE DISSECTION;  Surgeon: Alexis Frock, MD;  Location: [facility];  Service: Urology;  Laterality: N/A;  . EUS N/A 10/10/2014   Procedure: LOWER ENDOSCOPIC ULTRASOUND (EUS);  Surgeon: Milus Banister, MD;  Location: [facility];  Service: Endoscopy;  Laterality:  N/A;  . XI ROBOTIC ASSISTED LOWER ANTERIOR RESECTION N/A 12/11/2014   Procedure: XI ROBOTIC ASSISTED ABDOMINAL PERINEAL RESECTION OMENTOPLASTY;  Surgeon: Leighton Ruff, MD;  Location: [facility];  Service: General;  Laterality: N/A;    SOCIAL HISTORY: Social History   Social History  . Marital status: Married    Spouse name: N/A  . Number of children: 2  . Years of education: N/A   Occupational History  . retired    Social History  Main Topics  . Smoking status: Never Smoker  . Smokeless tobacco: Never Used  . Alcohol use 0.0 oz/week     Comment: rarely  . Drug use: No  . Sexual activity: Not on file   Other Topics Concern  . Not on file   Social History Narrative  . No narrative on file  2 children, 3 grandchildren, 1 expecting great grandchild Married Previously employed as a Psychologist, sport and exercise, tobacco raising Non-smoker ETOH, none Hobbies included golfing.  He no longer plays   FAMILY HISTORY: Family History  Problem Relation Age of Onset  . Diabetes Mother   . Heart disease Mother   . Heart disease Father   . Colon cancer Sister   . Prostate cancer Brother   . Colon polyps Daughter     x 2   indicated that his mother is deceased. He indicated that his father is deceased. He indicated that one of his two sisters is deceased. He indicated that one of his two brothers is deceased. He indicated that the status of his daughter is unknown.   Mother deceased, 35 Father deceased, 25, heart attack 6 brothers, 3 sisters.  1 brother deceased from prostate cancer 1 sister deceased from colon cancer (19) 1 brother deceased from heart attack  ALLERGIES:  has No Known Allergies.  MEDICATIONS:  Current Outpatient Prescriptions  Medication Sig Dispense Refill  . Acetaminophen (TYLENOL PO) Take 1 tablet by mouth.    Marland Kitchen ascorbic acid (VITAMIN C) 500 MG tablet Take 500 mg by mouth daily.    Marland Kitchen aspirin EC 81 MG tablet Take 162 mg by mouth at bedtime.     Marland Kitchen losartan (COZAAR) 50 MG tablet Take 25 mg by mouth daily.     . metoprolol succinate (TOPROL-XL) 50 MG 24 hr tablet Take 25 mg by mouth 2 (two) times daily. Take with or immediately following a meal.    . niacin 500 MG CR capsule Take 500 mg by mouth at bedtime.    Marland Kitchen omeprazole (PRILOSEC) 20 MG capsule Take 1 capsule by mouth daily.    . simvastatin (ZOCOR) 20 MG tablet Take 20 mg by mouth every evening.    . vitamin B-12 (CYANOCOBALAMIN) 1000 MCG tablet Take 1,000 mcg by  mouth daily. Reported on 06/12/2015    . VITAMIN D, CHOLECALCIFEROL, PO Take 500 mg by mouth daily.     No current facility-administered medications for this visit.    Review of Systems  Review of Systems  Constitutional: Negative.  Negative for weight loss.  HENT: Negative.   Eyes: Negative.   Respiratory: Negative.   Cardiovascular: Negative.   Gastrointestinal: Negative.        Lower abdominal hernia  Genitourinary: Negative.   Musculoskeletal: Positive for back pain.  Skin: Negative.   Neurological: Negative.   Endo/Heme/Allergies: Negative.   Psychiatric/Behavioral: Negative.   All other systems reviewed and are negative. 14 point ROS was done and is otherwise as detailed above or in HPI   PHYSICAL EXAMINATION: ECOG PERFORMANCE  STATUS: 1 - Symptomatic but completely ambulatory  Vitals:   10/13/15 1155  BP: (!) 154/67  Pulse: 66  Resp: 16  Temp: 97.8 F (36.6 C)   Filed Weights   10/13/15 1155  Weight: 187 lb 3.2 oz (84.9 kg)    Physical Exam  Constitutional: He is oriented to person, place, and time and well-developed, well-nourished, and in no distress.  Robust for age  HENT:  Head: Normocephalic and atraumatic.  Mouth/Throat: Oropharynx is clear and moist. No oropharyngeal exudate.  Eyes: Conjunctivae and EOM are normal. Pupils are equal, round, and reactive to light. Right eye exhibits no discharge. Left eye exhibits no discharge. No scleral icterus.  Neck: Normal range of motion. Neck supple. No thyromegaly present.  Cardiovascular: Normal rate, regular rhythm and normal heart sounds.   No murmur heard. Pulmonary/Chest: Effort normal and breath sounds normal. No respiratory distress. He has no wheezes. He has no rales. He exhibits no tenderness.  Abdominal: Soft. Bowel sounds are normal. He exhibits no distension. There is no tenderness. There is no rebound and no guarding.  Bilateral ostomies Large abdominal wall defect from prior surgery    Musculoskeletal: Normal range of motion. He exhibits no edema or tenderness.  Lymphadenopathy:    He has no cervical adenopathy.  Neurological: He is alert and oriented to person, place, and time. No cranial nerve deficit. Gait normal.  Skin: Skin is warm and dry. No rash noted. No erythema.  Psychiatric: Mood, memory, affect and judgment normal.  Nursing note and vitals reviewed.   LABORATORY DATA:  I have reviewed the data as listed  Results for Ian Christensen, Ian Christensen (MRN [id number])  Ref. Range 06/12/2015 12:25  Sodium Latest Ref Range: 135-145 mmol/L 138  Potassium Latest Ref Range: 3.5-5.1 mmol/L 4.3  Chloride Latest Ref Range: 101-111 mmol/L 107  CO2 Latest Ref Range: 22-32 mmol/L 26  BUN Latest Ref Range: 6-20 mg/dL 19  Creatinine Latest Ref Range: 0.61-1.24 mg/dL 1.11  Calcium Latest Ref Range: 8.9-10.3 mg/dL 9.4  EGFR (Non-African Amer.) Latest Ref Range: >60 mL/min >60  EGFR (African American) Latest Ref Range: >60 mL/min >60  Glucose Latest Ref Range: 65-99 mg/dL 94  Anion gap Latest Ref Range: 5-15  5  Alkaline Phosphatase Latest Ref Range: 38-126 U/L 49  Albumin Latest Ref Range: 3.5-5.0 g/dL 4.2  AST Latest Ref Range: 15-41 U/L 21  ALT Latest Ref Range: 17-63 U/L 14 (L)  Total Protein Latest Ref Range: 6.5-8.1 g/dL 7.0  Total Bilirubin Latest Ref Range: 0.3-1.2 mg/dL 0.6  WBC Latest Ref Range: 4.0-10.5 K/uL 8.3  RBC Latest Ref Range: 4.22-5.81 MIL/uL 4.82  Hemoglobin Latest Ref Range: 13.0-17.0 g/dL 14.7  HCT Latest Ref Range: 39.0-52.0 % 44.6  MCV Latest Ref Range: 78.0-100.0 fL 92.5  MCH Latest Ref Range: 26.0-34.0 pg 30.5  MCHC Latest Ref Range: 30.0-36.0 g/dL 33.0  RDW Latest Ref Range: 11.5-15.5 % 14.3  Platelets Latest Ref Range: 150-400 K/uL 201  Neutrophils Latest Units: % 54  Lymphocytes Latest Units: % 33  Monocytes Relative Latest Units: % 9  Eosinophil Latest Units: % 3  Basophil Latest Units: % 1  NEUT# Latest Ref Range: 1.7-7.7 K/uL 4.5  Lymphocyte  # Latest Ref Range: 0.7-4.0 K/uL 2.7  Monocyte # Latest Ref Range: 0.1-1.0 K/uL 0.7  Eosinophils Absolute Latest Ref Range: 0.0-0.7 K/uL 0.3  Basophils Absolute Latest Ref Range: 0.0-0.1 K/uL 0.1    PATHOLOGY:         RADIOLOGY: I have reviewed the images  below and agree with the reported results Study Result   CLINICAL DATA:  Renal cysts, acquired.  EXAM: RENAL / URINARY TRACT ULTRASOUND COMPLETE  COMPARISON:  CT of the abdomen and pelvis 06/20/2015 and 10/08/2014.  FINDINGS: Right Kidney:  Length: 10.5 cm, within normal limits. The renal parenchyma is isoechoic to the liver. There is no stone or hydro --- the there is no stone or hydronephrosis. A hypoechoic lesion at the lower pole of the right kidney measures 1.2 cm maximally. This appears to be a simple cyst. No other focal lesions are present. The second lower pole cyst is not visualized.  Left Kidney:  Length: 11.3 cm, within normal limits. The renal parenchyma is somewhat hyperechoic. It exophytic cyst near the upper pole appears simple. It is stable at 2.8 cm. A 9 mm simple cyst is present near the lower pole.  Bladder:  Appears normal for degree of bladder distention.  IMPRESSION: 1. Stable bilateral renal cysts. These are benign and stable. They require no further follow-up. 2. Renal parenchyma is somewhat hyperechoic bilaterally. This is nonspecific, but most commonly seen in the setting of medical renal disease. The patient's last GFR was normal.   Electronically Signed   By: San Morelle M.D.   On: 08/22/2015 13:34    ASSESSMENT & PLAN:  Stage IIB adenocarcinoma of the rectum Prostate Cancer treated with bracytherapy Pulmonary nodules CT with bilateral low-attenuation renal lesions with internal densities greater than that of fluid, indeterminate S/P definitive surgical therapy with colostomy/urostomy Anemia Back pain   He is doing well. He is active and has begun  playing golf again. Weight is up.   He is up to date on imaging. Will need to be repeated next year. He has refused repeat colonoscopy.  CEA was not elevated pre-operatively and therefore unlikely to be helpful for surveillance. We reviewed guidelines.   NCCN guidelines for surveillance for Colon cancer are as follows (1.2017):  B. Stage II, Stage III 1. H+P every 3-6 months x 2 years and then every 6 months for a total of 5 years  2. CEA every 3-6 months x 2 years and then every 6 months for a total of 5 years  3. CT CAP every 6-12 months (category 2B for frequency < 12 months) for a total of 5 years . 4.  Colonoscopy in 1 year except if no preoperative colonoscopy due to obstructing lesion, colonoscopy in 3-6 months.  A. If advanced adenoma, repeat in 1 year B. If no advanced adenoma, repeat in 3 years, then every 5 years 5. PET/CT scan is not recommended.  He will contact us if his back pain worsens. I advised him he can use intermittent advil but not daily, if pain becomes persistent he notes he will call.  He will return for follow up in 4 months with PE and labs,  repeat scans in June.   Orders Placed This Encounter  Procedures  . CBC with Differential    Standing Status:   Future    Standing Expiration Date:   10/12/2016  . Comprehensive metabolic panel    Standing Status:   Future    Standing Expiration Date:   10/12/2016  . CEA    Standing Status:   Future    Standing Expiration Date:   10/12/2016    All questions were answered. The patient knows to call the clinic with any problems, questions or concerns.   This document serves as a record of services personally performed by Larene Beach  Quintavious Rinck, MD. It was created on her behalf by Arlyce Harman, a trained medical scribe. The creation of this record is based on the scribe's personal observations and the provider's statements to them. This document has been checked and approved by the attending provider.  I have reviewed the  above documentation for accuracy and completeness, and I agree with the above.  This note was electronically signed.   Kelby Fam. Whitney Muse, MD

## 2015-10-13 NOTE — Patient Instructions (Signed)
[place] at [facility] Discharge Instructions  RECOMMENDATIONS MADE BY THE CONSULTANT AND ANY TEST RESULTS WILL BE SENT TO YOUR REFERRING PHYSICIAN.  You saw Dr. Whitney Muse today. Follow up in 4 months with lab work.  Thank you for choosing [place] at [facility] to provide your oncology and hematology care.  To afford each patient quality time with our provider, please arrive at least 15 minutes before your scheduled appointment time.   Beginning January 23rd 2017 lab work for the [facility] will be done in the  Main lab at [facility] on 1st floor. If you have a lab appointment with the [facility] please come in thru the  Main Entrance and check in at the main information desk  You need to re-schedule your appointment should you arrive 10 or more minutes late.  We strive to give you quality time with our providers, and arriving late affects you and other patients whose appointments are after yours.  Also, if you no show three or more times for appointments you may be dismissed from the clinic at the providers discretion.     Again, thank you for choosing [facility].  Our hope is that these requests will decrease the amount of time that you wait before being seen by our physicians.       _____________________________________________________________  Should you have questions after your visit to [facility], please contact our office at [phone] between the hours of 8:30 a.m. and 4:30 p.m.  Voicemails left after 4:30 p.m. will not be returned until the following business day.  For prescription refill requests, have your pharmacy contact our office.         Resources For Cancer Patients and their Caregivers ? American Cancer Society: Can assist with transportation, wigs, general needs, runs Look Good Feel Better.        651-652-5030 ? Cancer Care: Provides financial assistance, online support  groups, medication/co-pay assistance.  1-800-813-HOPE (330)296-1895) ? [place] Assists [place] cancer patients and their families through emotional , educational and financial support.  631-060-9565 ? [place] DSS Where to apply for food stamps, Medicaid and utility assistance. 415-323-6586 ? [facility]: Transportation to medical appointments. (224) 026-8733 ? Social Security Administration: May apply for disability if have a Stage IV cancer. 740-642-7484 6167289318 ? [employer], Disability and Transit Services: Assists with nutrition, care and transit needs. [place] Support Programs: @10RELATIVEDAYS @ > Cancer Support Group  2nd Tuesday of the month 1pm-2pm, Journey Room  > Creative Journey  3rd Tuesday of the month 1130am-1pm, Journey Room  > Look Good Feel Better  1st Wednesday of the month 10am-12 noon, Journey Room (Call [place] to register [phone])

## 2015-10-14 DIAGNOSIS — C20 Malignant neoplasm of rectum: Secondary | ICD-10-CM | POA: Diagnosis not present

## 2015-10-14 DIAGNOSIS — Z933 Colostomy status: Secondary | ICD-10-CM | POA: Diagnosis not present

## 2015-10-14 DIAGNOSIS — Z936 Other artificial openings of urinary tract status: Secondary | ICD-10-CM | POA: Diagnosis not present

## 2015-10-14 LAB — CEA: CEA: 1.6 ng/mL (ref 0.0–4.7)

## 2015-10-29 ENCOUNTER — Encounter: Payer: Self-pay | Admitting: Internal Medicine

## 2015-11-03 ENCOUNTER — Telehealth: Payer: Self-pay | Admitting: Internal Medicine

## 2015-11-03 NOTE — Telephone Encounter (Signed)
Daughter notified that he can still have procedure with a colonoscopy.  She stated that she will have the patient to call back to schedule.

## 2015-11-04 ENCOUNTER — Telehealth (HOSPITAL_COMMUNITY): Payer: Self-pay | Admitting: *Deleted

## 2015-11-04 NOTE — Telephone Encounter (Signed)
Spoke with patients daughter Jackelyn Poling. Per Dr.Penland patient should go to GI and have colostomy if needed. Verbalized understanding.

## 2015-11-04 NOTE — Telephone Encounter (Signed)
-----   Message from Patrici Ranks, MD sent at 11/04/2015  3:19 PM EDT ----- Regarding: RE: test I discussed this with him at last visit. He should go if called back.  Dr.P   ----- Message ----- From: Berneta Levins, RN Sent: 11/04/2015   2:46 PM To: Patrici Ranks, MD Subject: FW: test                                         ----- Message ----- From: Louis Meckel Sent: 11/04/2015  12:23 PM To: Onc Nurse Ap Subject: test                                           Patients daughter called and stated his gastro dr called and it is time for his colonoscopy and she wants to know if Dr Whitney Muse thinks it is ok for him to take or not.  Please call daughter at [phone] - Bernette Mayers  Thanks

## 2015-11-11 ENCOUNTER — Encounter: Payer: Self-pay | Admitting: Internal Medicine

## 2015-12-03 DIAGNOSIS — Z933 Colostomy status: Secondary | ICD-10-CM | POA: Diagnosis not present

## 2015-12-03 DIAGNOSIS — C20 Malignant neoplasm of rectum: Secondary | ICD-10-CM | POA: Diagnosis not present

## 2015-12-03 DIAGNOSIS — Z936 Other artificial openings of urinary tract status: Secondary | ICD-10-CM | POA: Diagnosis not present

## 2015-12-23 ENCOUNTER — Ambulatory Visit (AMBULATORY_SURGERY_CENTER): Payer: Self-pay

## 2015-12-23 VITALS — Ht 67.0 in | Wt 189.8 lb

## 2015-12-23 DIAGNOSIS — Z85048 Personal history of other malignant neoplasm of rectum, rectosigmoid junction, and anus: Secondary | ICD-10-CM

## 2015-12-23 NOTE — Progress Notes (Signed)
Per pt, no allergies to soy or egg products.Pt not taking any weight loss meds or using  O2 at home. 

## 2015-12-24 ENCOUNTER — Encounter: Payer: Self-pay | Admitting: Internal Medicine

## 2015-12-31 ENCOUNTER — Encounter: Payer: Self-pay | Admitting: Internal Medicine

## 2015-12-31 ENCOUNTER — Ambulatory Visit (AMBULATORY_SURGERY_CENTER): Payer: PPO | Admitting: Internal Medicine

## 2015-12-31 VITALS — BP 148/75 | HR 69 | Temp 98.9°F | Resp 20 | Ht 67.0 in | Wt 189.0 lb

## 2015-12-31 DIAGNOSIS — Z85048 Personal history of other malignant neoplasm of rectum, rectosigmoid junction, and anus: Secondary | ICD-10-CM | POA: Diagnosis not present

## 2015-12-31 DIAGNOSIS — E669 Obesity, unspecified: Secondary | ICD-10-CM | POA: Diagnosis not present

## 2015-12-31 DIAGNOSIS — K219 Gastro-esophageal reflux disease without esophagitis: Secondary | ICD-10-CM | POA: Diagnosis not present

## 2015-12-31 DIAGNOSIS — I10 Essential (primary) hypertension: Secondary | ICD-10-CM | POA: Diagnosis not present

## 2015-12-31 MED ORDER — SODIUM CHLORIDE 0.9 % IV SOLN: 500.0000 mL | INTRAVENOUS | Status: DC

## 2015-12-31 NOTE — Op Note (Signed)
[facility] Patient Name: Ian Christensen Procedure Date: 12/31/2015 3:47 PM MRN: [id number] Endoscopist: Iva Boop , MD Age: 82 Referring MD:  Date of Birth: 08-25-1931 Gender: Male Account #: [id number] Procedure:                Colonoscopy Indications:              High risk colon cancer surveillance: Personal                            history of rectal cancer Medicines:                Propofol per Anesthesia, Monitored Anesthesia Care Procedure:                Pre-Anesthesia Assessment:                           - Prior to the procedure, a History and Physical                            was performed, and patient medications and                            allergies were reviewed. The patient's tolerance of                            previous anesthesia was also reviewed. The risks                            and benefits of the procedure and the sedation                            options and risks were discussed with the patient.                            All questions were answered, and informed consent                            was obtained. Prior Anticoagulants: The patient                            last took aspirin 5 days prior to the procedure.                            ASA Grade Assessment: II - A patient with mild                            systemic disease. After reviewing the risks and                            benefits, the patient was deemed in satisfactory                            condition to undergo the procedure.  After obtaining informed consent, the colonoscope                            was passed under direct vision. Throughout the                            procedure, the patient's blood pressure, pulse, and                            oxygen saturations were monitored continuously. The                            Model CF-HQ190L [id number]) scope was introduced                            through the sigmoid  colostomy and advanced to the                            the cecum, identified by appendiceal orifice and                            ileocecal valve. The colonoscopy was performed                            without difficulty. The patient tolerated the                            procedure well. The quality of the bowel                            preparation was good. The bowel preparation used                            was Miralax. And colostomy were photographed. Scope In: 3:57:26 PM Scope Out: 4:05:51 PM Scope Withdrawal Time: 0 hours 5 minutes 8 seconds  Total Procedure Duration: 0 hours 8 minutes 25 seconds  Findings:                 There was evidence of a patent end colostomy in the                            sigmoid colon.                           A few small-mouthed diverticula were found in the                            left colon.                           The exam was otherwise without abnormality. Complications:            No immediate complications. Estimated Blood Loss:     Estimated blood loss: none. Impression:               - Patent end  colostomy in the sigmoid colon.                           - Diverticulosis in the left colon.                           - The examination was otherwise normal.                           - No specimens collected. Recommendation:           - Patient has a contact number available for                            emergencies. The signs and symptoms of potential                            delayed complications were discussed with the                            patient. Return to normal activities tomorrow.                            Written discharge instructions were provided to the                            patient.                           - Resume previous diet.                           - Continue present medications.                           - Repeat colonoscopy in 3 years for surveillance.                           - Will  consider repeating a colonoscopy as he will                            be 87 in 3 yrs. Iva Boop, MD 12/31/2015 4:14:41 PM This report has been signed electronically.

## 2015-12-31 NOTE — Progress Notes (Signed)
Report to PACU, RN, vss, BBS= Clear.  

## 2015-12-31 NOTE — Patient Instructions (Addendum)
No polyps, no cancer seen. We will consider repeating another colonoscopy in 3 years but at 41 (then) it might not make sense.   I appreciate the opportunity to care for you. Gatha Mayer, MD, [facility]   Handout given on diverticulosis. Call us with any questions or concerns. Thank you!   YOU HAD AN ENDOSCOPIC PROCEDURE TODAY AT [facility]:   Refer to the procedure report that was given to you for any specific questions about what was found during the examination.  If the procedure report does not answer your questions, please call your gastroenterologist to clarify.  If you requested that your care partner not be given the details of your procedure findings, then the procedure report has been included in a sealed envelope for you to review at your convenience later.  YOU SHOULD EXPECT: Some feelings of bloating in the abdomen. Passage of more gas than usual.  Walking can help get rid of the air that was put into your GI tract during the procedure and reduce the bloating. If you had a lower endoscopy (such as a colonoscopy or flexible sigmoidoscopy) you may notice spotting of blood in your stool or on the toilet paper. If you underwent a bowel prep for your procedure, you may not have a normal bowel movement for a few days.  Please Note:  You might notice some irritation and congestion in your nose or some drainage.  This is from the oxygen used during your procedure.  There is no need for concern and it should clear up in a day or so.  SYMPTOMS TO REPORT IMMEDIATELY:   Following lower endoscopy (colonoscopy or flexible sigmoidoscopy):  Excessive amounts of blood in the stool  Significant tenderness or worsening of abdominal pains  Swelling of the abdomen that is new, acute  Fever of 100F or higher  For urgent or emergent issues, a gastroenterologist can be reached at any hour by calling [phone].   DIET:  We do recommend a small meal at first, but then you  may proceed to your regular diet.  Drink plenty of fluids but you should avoid alcoholic beverages for 24 hours.  ACTIVITY:  You should plan to take it easy for the rest of today and you should NOT DRIVE or use heavy machinery until tomorrow (because of the sedation medicines used during the test).    FOLLOW UP: Our staff will call the number listed on your records the next business day following your procedure to check on you and address any questions or concerns that you may have regarding the information given to you following your procedure. If we do not reach you, we will leave a message.  However, if you are feeling well and you are not experiencing any problems, there is no need to return our call.  We will assume that you have returned to your regular daily activities without incident.  If any biopsies were taken you will be contacted by phone or by letter within the next 1-3 weeks.  Please call us at [phone] if you have not heard about the biopsies in 3 weeks.    SIGNATURES/CONFIDENTIALITY: You and/or your care partner have signed paperwork which will be entered into your electronic medical record.  These signatures attest to the fact that that the information above on your After Visit Summary has been reviewed and is understood.  Full responsibility of the confidentiality of this discharge information lies with you and/or your care-partner.

## 2016-01-01 ENCOUNTER — Telehealth: Payer: Self-pay | Admitting: *Deleted

## 2016-01-01 NOTE — Telephone Encounter (Signed)
  Follow up Call-  Call back number 12/31/2015 10/02/2014  Post procedure Call Back phone  # [phone] cell [phone]  Permission to leave phone message Yes Yes  Some recent data might be hidden     Patient questions:  Do you have a fever, pain , or abdominal swelling? No. Pain Score  0 *  Have you tolerated food without any problems? Yes.    Have you been able to return to your normal activities? Yes.    Do you have any questions about your discharge instructions: Diet   No. Medications  No. Follow up visit  No.  Do you have questions or concerns about your Care? No.  Actions: * If pain score is 4 or above: No action needed, pain <4.

## 2016-01-08 DIAGNOSIS — E663 Overweight: Secondary | ICD-10-CM | POA: Diagnosis not present

## 2016-01-08 DIAGNOSIS — H8113 Benign paroxysmal vertigo, bilateral: Secondary | ICD-10-CM | POA: Diagnosis not present

## 2016-01-08 DIAGNOSIS — Z1389 Encounter for screening for other disorder: Secondary | ICD-10-CM | POA: Diagnosis not present

## 2016-01-08 DIAGNOSIS — Z6828 Body mass index (BMI) 28.0-28.9, adult: Secondary | ICD-10-CM | POA: Diagnosis not present

## 2016-01-08 DIAGNOSIS — H8303 Labyrinthitis, bilateral: Secondary | ICD-10-CM | POA: Diagnosis not present

## 2016-01-21 DIAGNOSIS — Z23 Encounter for immunization: Secondary | ICD-10-CM | POA: Diagnosis not present

## 2016-02-03 DIAGNOSIS — H2513 Age-related nuclear cataract, bilateral: Secondary | ICD-10-CM | POA: Diagnosis not present

## 2016-02-12 ENCOUNTER — Encounter (HOSPITAL_COMMUNITY): Payer: PPO | Attending: Hematology & Oncology | Admitting: Hematology & Oncology

## 2016-02-12 ENCOUNTER — Encounter (HOSPITAL_COMMUNITY): Payer: PPO

## 2016-02-12 ENCOUNTER — Encounter (HOSPITAL_COMMUNITY): Payer: Self-pay | Admitting: Hematology & Oncology

## 2016-02-12 VITALS — BP 154/71 | HR 66 | Temp 97.6°F | Resp 18 | Wt 184.6 lb

## 2016-02-12 DIAGNOSIS — Z8546 Personal history of malignant neoplasm of prostate: Secondary | ICD-10-CM | POA: Diagnosis not present

## 2016-02-12 DIAGNOSIS — C20 Malignant neoplasm of rectum: Secondary | ICD-10-CM

## 2016-02-12 DIAGNOSIS — R918 Other nonspecific abnormal finding of lung field: Secondary | ICD-10-CM

## 2016-02-12 DIAGNOSIS — C61 Malignant neoplasm of prostate: Secondary | ICD-10-CM | POA: Diagnosis not present

## 2016-02-12 DIAGNOSIS — Z85048 Personal history of other malignant neoplasm of rectum, rectosigmoid junction, and anus: Secondary | ICD-10-CM

## 2016-02-12 LAB — CBC WITH DIFFERENTIAL/PLATELET
BASOS ABS: 0.1 10*3/uL (ref 0.0–0.1)
Basophils Relative: 1 %
Eosinophils Absolute: 0.3 10*3/uL (ref 0.0–0.7)
Eosinophils Relative: 4 %
HEMATOCRIT: 46.3 % (ref 39.0–52.0)
HEMOGLOBIN: 15.5 g/dL (ref 13.0–17.0)
LYMPHS PCT: 31 %
Lymphs Abs: 2.5 10*3/uL (ref 0.7–4.0)
MCH: 32.1 pg (ref 26.0–34.0)
MCHC: 33.5 g/dL (ref 30.0–36.0)
MCV: 95.9 fL (ref 78.0–100.0)
Monocytes Absolute: 0.9 10*3/uL (ref 0.1–1.0)
Monocytes Relative: 11 %
NEUTROS ABS: 4.4 10*3/uL (ref 1.7–7.7)
Neutrophils Relative %: 53 %
Platelets: 180 10*3/uL (ref 150–400)
RBC: 4.83 MIL/uL (ref 4.22–5.81)
RDW: 13.5 % (ref 11.5–15.5)
WBC: 8.1 10*3/uL (ref 4.0–10.5)

## 2016-02-12 LAB — COMPREHENSIVE METABOLIC PANEL
ALK PHOS: 46 U/L (ref 38–126)
ALT: 20 U/L (ref 17–63)
AST: 20 U/L (ref 15–41)
Albumin: 4.2 g/dL (ref 3.5–5.0)
Anion gap: 5 (ref 5–15)
BILIRUBIN TOTAL: 0.5 mg/dL (ref 0.3–1.2)
BUN: 23 mg/dL — AB (ref 6–20)
CHLORIDE: 104 mmol/L (ref 101–111)
CO2: 28 mmol/L (ref 22–32)
CREATININE: 1.24 mg/dL (ref 0.61–1.24)
Calcium: 9.6 mg/dL (ref 8.9–10.3)
GFR calc Af Amer: >60 mL/min (ref >60)
GFR calc non Af Amer: 52 mL/min — ABNORMAL LOW (ref >60)
Glucose, Bld: 88 mg/dL (ref 65–99)
Potassium: 4 mmol/L (ref 3.5–5.1)
Sodium: 137 mmol/L (ref 135–145)
TOTAL PROTEIN: 7 g/dL (ref 6.5–8.1)

## 2016-02-12 NOTE — Patient Instructions (Signed)
[place] at [facility] Discharge Instructions  RECOMMENDATIONS MADE BY THE CONSULTANT AND ANY TEST RESULTS WILL BE SENT TO YOUR REFERRING PHYSICIAN.  You were seen today by Dr. Whitney Muse Follow up in May with lab work and at that time will discuss scans  Thank you for choosing [place] at [facility] to provide your oncology and hematology care.  To afford each patient quality time with our provider, please arrive at least 15 minutes before your scheduled appointment time.    If you have a lab appointment with the [facility] [facility] please come in thru the  Main Entrance and check in at the main information desk  You need to re-schedule your appointment should you arrive 10 or more minutes late.  We strive to give you quality time with our providers, and arriving late affects you and other patients whose appointments are after yours.  Also, if you no show three or more times for appointments you may be dismissed from the clinic at the providers discretion.     Again, thank you for choosing [facility].  Our hope is that these requests will decrease the amount of time that you wait before being seen by our physicians.       _____________________________________________________________  Should you have questions after your visit to [facility], please contact our office at [phone] between the hours of 8:30 a.m. and 4:30 p.m.  Voicemails left after 4:30 p.m. will not be returned until the following business day.  For prescription refill requests, have your pharmacy contact our office.       Resources For Cancer Patients and their Caregivers ? American Cancer Society: Can assist with transportation, wigs, general needs, runs Look Good Feel Better.        (519) 160-3661 ? Cancer Care: Provides financial assistance, online support groups, medication/co-pay assistance.  1-800-813-HOPE (240)017-8467) ? [place] Assists [place] cancer patients and their families through emotional , educational and financial support.  843-493-5859 ? [place] DSS Where to apply for food stamps, Medicaid and utility assistance. 848-084-6814 ? RCATS: Transportation to medical appointments. (580)389-9452 ? Social Security Administration: May apply for disability if have a Stage IV cancer. 970-609-6860 607-571-4048 ? [employer], Disability and Transit Services: Assists with nutrition, care and transit needs. [place] Support Programs: @10RELATIVEDAYS @ > Cancer Support Group  2nd Tuesday of the month 1pm-2pm, Journey Room  > Creative Journey  3rd Tuesday of the month 1130am-1pm, Journey Room  > Look Good Feel Better  1st Wednesday of the month 10am-12 noon, Journey Room (Call [place] to register [phone])

## 2016-02-12 NOTE — Progress Notes (Signed)
[place] at [place] NOTE  Patient Care Team: Redmond School, MD as PCP - General (Internal Medicine)  CHIEF COMPLAINTS/PURPOSE OF CONSULTATION:  Stage IIIB rectal adenocarcinoma Colonoscopy on 10/02/2014 with one third circumferential medium mass found in the distal rectum, ulcerated, firm anterior over prostate bed with multiple biopsies, final pathology c/w adenocarcinoma GI primary CT of the chest abdomen and pelvis on 10/08/2014 showing wall thickening of the rectum, multiple bilateral pulmonary nodules, nodular soft tissue within the left main stem bronchus which is nonspecific, no evidence of focal hepatic lesion, bilateral low attenuation renal lesions with internal densities greater than that of fluid, indeterminate History of prostate cancer treated with brachytherapy, followed by Dr. Jeffie Pollock  EUS on 10/10/2014 with final staging uT3 uN1b, stage IIIB rectal adenocarcinoma along the anterior wall of the distal rectum with distal edge 1-2 cm from the anal verge  12/11/2014 cystoscopy, bilateral pyelograms, insertion of bilateral externalized ureteral stent, robotic assisted laparoscopic radical cystoprostatectomy with bilateral pelvic lymphadenectomy and colon conduit urinary diversion with Dr. Alexis Frock and Dr. Leighton Ruff  Final pathology T3 vs. T4b (tumor board) N0M0 adenocarcinoma of rectum  HISTORY OF PRESENTING ILLNESS:  Ian Christensen 83 y.o. male is here for follow up of rectal carcinoma.    Ian Christensen is unaccompanied. I personally reviewed and went over labs with the patient.  Said he had some dizzy spells and had an ear infection. He got it treated by Dr. Gerarda Fraction. He states he is better. This morning when he stood up he felt a little dizzy, but all throughout  the day he has been fine.   States he has been doing "same old same old."States he has been gambling. He had good luck the last time he went. He won $4000.   He notes it has been  tough since he lost his wife. They were married for 62 years. He notes however that he is overall doing fairly well.   Denies general pain.  Denies blood in his ostomy bags. No problems with either ostomy.  States if he does any walking, his knees hurt early in the morning the next day. But once he starts standing it stops.   Appetite is good. Colonoscopy was in 12/2015 with Dr. Carlean Purl. Last CT imaging was in June of 2017.   MEDICAL HISTORY:  Past Medical History:  Diagnosis Date  . Anal fissure   . Arthritis   . Cataract    left eye   . Colon polyps   . Colostomy in place [facility])   . GERD (gastroesophageal reflux disease)   . High cholesterol   . History of hiatal hernia   . History of measles   . History of umbilical hernia   . History of urostomy   . HTN (hypertension)   . Imbalance   . Prostate cancer ([place])    in remission 2001. (seed implants)  . Rectal cancer ([place]) 10/07/2014   no chemo or radiation/ bladder was removed also  . Tinnitus     SURGICAL HISTORY: Past Surgical History:  Procedure Laterality Date  . APPENDECTOMY  1955  . COLONOSCOPY     removes 1 polp  . CYSTO N/A 12/11/2014   Procedure:  CYSTO,  PROSTATECTOMY, CYSTECTOMY colon conduit, BILATERAL LYMPH NODE DISSECTION;  Surgeon: Alexis Frock, MD;  Location: [facility];  Service: Urology;  Laterality: N/A;  . EUS N/A 10/10/2014   Procedure: LOWER ENDOSCOPIC ULTRASOUND (EUS);  Surgeon: Milus Banister, MD;  Location: [facility] ENDOSCOPY;  Service: Endoscopy;  Laterality: N/A;  . XI ROBOTIC ASSISTED LOWER ANTERIOR RESECTION N/A 12/11/2014   Procedure: XI ROBOTIC ASSISTED ABDOMINAL PERINEAL RESECTION OMENTOPLASTY;  Surgeon: Leighton Ruff, MD;  Location: [facility];  Service: General;  Laterality: N/A;    SOCIAL HISTORY: Social History   Social History  . Marital status: Married    Spouse name: N/A  . Number of children: 2  . Years of education: N/A   Occupational History  . retired    Social History Main  Topics  . Smoking status: Never Smoker  . Smokeless tobacco: Never Used  . Alcohol use 0.0 oz/week     Comment: rarely  . Drug use: No  . Sexual activity: Not on file   Other Topics Concern  . Not on file   Social History Narrative  . No narrative on file  2 children, 3 grandchildren, 1 expecting great grandchild Married Previously employed as a Psychologist, sport and exercise, tobacco raising Non-smoker ETOH, none Hobbies included golfing.  He no longer plays   FAMILY HISTORY: Family History  Problem Relation Age of Onset  . Diabetes Mother   . Heart disease Mother   . Heart disease Father   . Colon cancer Sister   . Prostate cancer Brother   . Esophageal cancer Neg Hx   . Rectal cancer Neg Hx   . Stomach cancer Neg Hx    indicated that his mother is deceased. He indicated that his father is deceased. He indicated that his sister is deceased. He indicated that two of his three brothers are alive. He indicated that the status of his neg hx is unknown.   Mother deceased, 77 Father deceased, 72, heart attack 6 brothers, 3 sisters.  1 brother deceased from prostate cancer 1 sister deceased from colon cancer 03-20-2054) 1 brother deceased from heart attack  ALLERGIES:  is allergic to morphine and related and oxycodone.  MEDICATIONS:  Current Outpatient Prescriptions  Medication Sig Dispense Refill  . metoprolol succinate (TOPROL-XL) 25 MG 24 hr tablet     . simvastatin (ZOCOR) 20 MG tablet     . Acetaminophen (TYLENOL PO) Take 1 tablet by mouth.    Marland Kitchen acetaminophen (TYLENOL) 80 MG/0.8ML suspension Take by mouth.    Marland Kitchen ascorbic acid (VITAMIN C) 500 MG tablet Take 500 mg by mouth daily.    Marland Kitchen aspirin EC 81 MG tablet Take 162 mg by mouth at bedtime.     . Cholecalciferol (VITAMIN D3) 100000 UNIT/GM POWD Take by mouth.    . losartan (COZAAR) 50 MG tablet Take by mouth.    . meclizine (ANTIVERT) 25 MG tablet     . niacin 500 MG CR capsule Take 500 mg by mouth at bedtime.    Marland Kitchen omeprazole (PRILOSEC) 20 MG  capsule Take 1 capsule by mouth daily.    . vitamin B-12 (CYANOCOBALAMIN) 1000 MCG tablet Take 1,000 mcg by mouth daily. Reported on 06/12/2015    . VITAMIN D, CHOLECALCIFEROL, PO Take 500 mg by mouth daily.     Current Facility-Administered Medications  Medication Dose Route Frequency Provider Last Rate Last Dose  . 0.9 %  sodium chloride infusion  500 mL Intravenous Continuous Gatha Mayer, MD        Review of Systems  Review of Systems  Constitutional: Negative.  Negative for weight loss.  HENT: Negative.   Eyes: Negative.   Respiratory: Negative.   Cardiovascular: Negative.   Gastrointestinal: Negative.  Negative for blood in stool, constipation,  diarrhea and melena.       Denies blood in his ostomy bags  Genitourinary: Negative.   Musculoskeletal: Negative for myalgias.       If he does any walking his legs hurt in the morning but once he starts standing it stops.  Skin: Negative.   Neurological: Positive for dizziness (a few dizzy spells. When he stood up this morning he felt dizzy but throught out the day it has been fine).  Endo/Heme/Allergies: Negative.   Psychiatric/Behavioral: Negative.   All other systems reviewed and are negative. 14 point ROS was done and is otherwise as detailed above or in HPI   PHYSICAL EXAMINATION: ECOG PERFORMANCE STATUS: 1 - Symptomatic but completely ambulatory  Vitals:   02/12/16 1156  BP: (!) 154/71  Pulse: 66  Resp: 18  Temp: 97.6 F (36.4 C)   Filed Weights   02/12/16 1156  Weight: 184 lb 9.6 oz (83.7 kg)    Physical Exam  Constitutional: He is oriented to person, place, and time and well-developed, well-nourished, and in no distress.  Robust for age .   HENT:  Head: Normocephalic and atraumatic.  Mouth/Throat: Oropharynx is clear and moist. No oropharyngeal exudate.  Eyes: Conjunctivae and EOM are normal. Pupils are equal, round, and reactive to light. Right eye exhibits no discharge. Left eye exhibits no discharge. No  scleral icterus.  Neck: Normal range of motion. Neck supple. No thyromegaly present.  Cardiovascular: Normal rate, regular rhythm and normal heart sounds.   No murmur heard. Pulmonary/Chest: Effort normal and breath sounds normal. No respiratory distress. He has no wheezes. He has no rales. He exhibits no tenderness.  Abdominal: Soft. Bowel sounds are normal. He exhibits no distension. There is no tenderness. There is no rebound and no guarding.  Bilateral ostomies Large abdominal wall defect from prior surgery  Musculoskeletal: Normal range of motion. He exhibits no edema or tenderness.  Lymphadenopathy:    He has no cervical adenopathy.  Neurological: He is alert and oriented to person, place, and time. No cranial nerve deficit. Gait normal.  Skin: Skin is warm and dry. No rash noted. No erythema.  Psychiatric: Mood, memory, affect and judgment normal.  Nursing note and vitals reviewed.   LABORATORY DATA:  I have reviewed the data as listed  Results for AMIL, BOUWMAN (MRN [id number]) as of 02/12/2016 11:50  Ref. Range 02/12/2016 11:14  Sodium Latest Ref Range: 135 - 145 mmol/L 137  Potassium Latest Ref Range: 3.5 - 5.1 mmol/L 4.0  Chloride Latest Ref Range: 101 - 111 mmol/L 104  CO2 Latest Ref Range: 22 - 32 mmol/L 28  Glucose Latest Ref Range: 65 - 99 mg/dL 88  BUN Latest Ref Range: 6 - 20 mg/dL 23 (H)  Creatinine Latest Ref Range: 0.61 - 1.24 mg/dL 1.24  Calcium Latest Ref Range: 8.9 - 10.3 mg/dL 9.6  Anion gap Latest Ref Range: 5 - 15  5  Alkaline Phosphatase Latest Ref Range: 38 - 126 U/L 46  Albumin Latest Ref Range: 3.5 - 5.0 g/dL 4.2  AST Latest Ref Range: 15 - 41 U/L 20  ALT Latest Ref Range: 17 - 63 U/L 20  Total Protein Latest Ref Range: 6.5 - 8.1 g/dL 7.0  Total Bilirubin Latest Ref Range: 0.3 - 1.2 mg/dL 0.5  EGFR (African American) Latest Ref Range: >60 mL/min >60  EGFR (Non-African Amer.) Latest Ref Range: >60 mL/min 52 (L)  WBC Latest Ref Range: 4.0 - 10.5 K/uL  8.1  RBC Latest Ref  Range: 4.22 - 5.81 MIL/uL 4.83  Hemoglobin Latest Ref Range: 13.0 - 17.0 g/dL 15.5  HCT Latest Ref Range: 39.0 - 52.0 % 46.3  MCV Latest Ref Range: 78.0 - 100.0 fL 95.9  MCH Latest Ref Range: 26.0 - 34.0 pg 32.1  MCHC Latest Ref Range: 30.0 - 36.0 g/dL 33.5  RDW Latest Ref Range: 11.5 - 15.5 % 13.5  Platelets Latest Ref Range: 150 - 400 K/uL 180  Neutrophils Latest Units: % 53  Lymphocytes Latest Units: % 31  Monocytes Relative Latest Units: % 11  Eosinophil Latest Units: % 4  Basophil Latest Units: % 1  NEUT# Latest Ref Range: 1.7 - 7.7 K/uL 4.4  Lymphocyte # Latest Ref Range: 0.7 - 4.0 K/uL 2.5  Monocyte # Latest Ref Range: 0.1 - 1.0 K/uL 0.9  Eosinophils Absolute Latest Ref Range: 0.0 - 0.7 K/uL 0.3  Basophils Absolute Latest Ref Range: 0.0 - 0.1 K/uL 0.1    PATHOLOGY:         RADIOLOGY: I have reviewed the images below and agree with the reported results Study Result   CLINICAL DATA:  Renal cysts, acquired.  EXAM: RENAL / URINARY TRACT ULTRASOUND COMPLETE  COMPARISON:  CT of the abdomen and pelvis 06/20/2015 and 10/08/2014.  FINDINGS: Right Kidney:  Length: 10.5 cm, within normal limits. The renal parenchyma is isoechoic to the liver. There is no stone or hydro --- the there is no stone or hydronephrosis. A hypoechoic lesion at the lower pole of the right kidney measures 1.2 cm maximally. This appears to be a simple cyst. No other focal lesions are present. The second lower pole cyst is not visualized.  Left Kidney:  Length: 11.3 cm, within normal limits. The renal parenchyma is somewhat hyperechoic. It exophytic cyst near the upper pole appears simple. It is stable at 2.8 cm. A 9 mm simple cyst is present near the lower pole.  Bladder:  Appears normal for degree of bladder distention.  IMPRESSION: 1. Stable bilateral renal cysts. These are benign and stable. They require no further follow-up. 2. Renal parenchyma is  somewhat hyperechoic bilaterally. This is nonspecific, but most commonly seen in the setting of medical renal disease. The patient's last GFR was normal.   Electronically Signed   By: San Morelle M.D.   On: 08/22/2015 13:34    ASSESSMENT & PLAN:  Stage IIB adenocarcinoma of the rectum Prostate Cancer treated with bracytherapy Pulmonary nodules CT with bilateral low-attenuation renal lesions with internal densities greater than that of fluid, indeterminate S/P definitive surgical therapy with colostomy/urostomy Anemia Back pain   He is doing well. He is active. Appetite is excellent. He is up to date with Colonoscopy.    He is up to date on imaging. Will need to be repeated next year.   CEA was not elevated pre-operatively and therefore unlikely to be helpful for surveillance. We reviewed guidelines.   NCCN guidelines for surveillance for Colon cancer are as follows (1.2017):  B. Stage II, Stage III 1. H+P every 3-6 months x 2 years and then every 6 months for a total of 5 years  2. CEA every 3-6 months x 2 years and then every 6 months for a total of 5 years  3. CT CAP every 6-12 months (category 2B for frequency < 12 months) for a total of 5 years . 4.  Colonoscopy in 1 year except if no preoperative colonoscopy due to obstructing lesion, colonoscopy in 3-6 months.  A. If advanced adenoma, repeat  in 1 year B. If no advanced adenoma, repeat in 3 years, then every 5 years 5. PET/CT scan is not recommended.  Labs reviewed. Results noted above.  He goes to Urology for PSA March 3rd 2018.  RTC in 4 months. Order imaging at follow-up.   Orders Placed This Encounter  Procedures  . CBC with Differential    Standing Status:   Future    Standing Expiration Date:   08/11/2016  . Comprehensive metabolic panel    Standing Status:   Future    Standing Expiration Date:   08/11/2016    All questions were answered. The patient knows to call the clinic with any problems,  questions or concerns.   This document serves as a record of services personally performed by Ancil Linsey, MD. It was created on her behalf by Shirlean Mylar, a trained medical scribe. The creation of this record is based on the scribe's personal observations and the provider's statements to them. This document has been checked and approved by the attending provider.  I have reviewed the above documentation for accuracy and completeness, and I agree with the above.  This note was electronically signed.   Kelby Fam. Whitney Muse, MD

## 2016-02-13 LAB — CEA: CEA: 1.7 ng/mL (ref 0.0–4.7)

## 2016-02-24 ENCOUNTER — Encounter (HOSPITAL_COMMUNITY): Payer: Self-pay | Admitting: Hematology & Oncology

## 2016-03-01 DIAGNOSIS — Z936 Other artificial openings of urinary tract status: Secondary | ICD-10-CM | POA: Diagnosis not present

## 2016-03-01 DIAGNOSIS — C20 Malignant neoplasm of rectum: Secondary | ICD-10-CM | POA: Diagnosis not present

## 2016-03-01 DIAGNOSIS — Z933 Colostomy status: Secondary | ICD-10-CM | POA: Diagnosis not present

## 2016-04-13 DIAGNOSIS — C20 Malignant neoplasm of rectum: Secondary | ICD-10-CM | POA: Diagnosis not present

## 2016-04-13 DIAGNOSIS — Z936 Other artificial openings of urinary tract status: Secondary | ICD-10-CM | POA: Diagnosis not present

## 2016-04-13 DIAGNOSIS — Z933 Colostomy status: Secondary | ICD-10-CM | POA: Diagnosis not present

## 2016-05-07 ENCOUNTER — Ambulatory Visit (INDEPENDENT_AMBULATORY_CARE_PROVIDER_SITE_OTHER): Payer: PPO | Admitting: Urology

## 2016-05-07 DIAGNOSIS — Z8546 Personal history of malignant neoplasm of prostate: Secondary | ICD-10-CM

## 2016-05-07 DIAGNOSIS — N99523 Herniation of incontinent stoma of urinary tract: Secondary | ICD-10-CM | POA: Diagnosis not present

## 2016-05-21 DIAGNOSIS — Z933 Colostomy status: Secondary | ICD-10-CM | POA: Diagnosis not present

## 2016-05-21 DIAGNOSIS — Z936 Other artificial openings of urinary tract status: Secondary | ICD-10-CM | POA: Diagnosis not present

## 2016-05-21 DIAGNOSIS — C20 Malignant neoplasm of rectum: Secondary | ICD-10-CM | POA: Diagnosis not present

## 2016-05-31 ENCOUNTER — Encounter (HOSPITAL_COMMUNITY): Payer: Self-pay | Admitting: Oncology

## 2016-05-31 ENCOUNTER — Encounter (HOSPITAL_COMMUNITY): Payer: PPO

## 2016-05-31 ENCOUNTER — Encounter (HOSPITAL_COMMUNITY): Payer: PPO | Attending: Oncology | Admitting: Oncology

## 2016-05-31 DIAGNOSIS — Z85048 Personal history of other malignant neoplasm of rectum, rectosigmoid junction, and anus: Secondary | ICD-10-CM | POA: Diagnosis not present

## 2016-05-31 DIAGNOSIS — E7800 Pure hypercholesterolemia, unspecified: Secondary | ICD-10-CM | POA: Insufficient documentation

## 2016-05-31 DIAGNOSIS — C20 Malignant neoplasm of rectum: Secondary | ICD-10-CM | POA: Diagnosis not present

## 2016-05-31 DIAGNOSIS — C61 Malignant neoplasm of prostate: Secondary | ICD-10-CM

## 2016-05-31 DIAGNOSIS — K219 Gastro-esophageal reflux disease without esophagitis: Secondary | ICD-10-CM | POA: Insufficient documentation

## 2016-05-31 DIAGNOSIS — I10 Essential (primary) hypertension: Secondary | ICD-10-CM | POA: Insufficient documentation

## 2016-05-31 DIAGNOSIS — Z8546 Personal history of malignant neoplasm of prostate: Secondary | ICD-10-CM

## 2016-05-31 LAB — CBC WITH DIFFERENTIAL/PLATELET
BASOS ABS: 0.1 10*3/uL (ref 0.0–0.1)
Basophils Relative: 1 %
EOS ABS: 0.3 10*3/uL (ref 0.0–0.7)
EOS PCT: 3 %
HCT: 43.1 % (ref 39.0–52.0)
Hemoglobin: 14.4 g/dL (ref 13.0–17.0)
LYMPHS PCT: 33 %
Lymphs Abs: 3.1 10*3/uL (ref 0.7–4.0)
MCH: 31.8 pg (ref 26.0–34.0)
MCHC: 33.4 g/dL (ref 30.0–36.0)
MCV: 95.1 fL (ref 78.0–100.0)
MONO ABS: 1.1 10*3/uL — AB (ref 0.1–1.0)
Monocytes Relative: 12 %
Neutro Abs: 4.7 10*3/uL (ref 1.7–7.7)
Neutrophils Relative %: 51 %
PLATELETS: 196 10*3/uL (ref 150–400)
RBC: 4.53 MIL/uL (ref 4.22–5.81)
RDW: 12.8 % (ref 11.5–15.5)
WBC: 9.2 10*3/uL (ref 4.0–10.5)

## 2016-05-31 LAB — COMPREHENSIVE METABOLIC PANEL
ALT: 19 U/L (ref 17–63)
AST: 24 U/L (ref 15–41)
Albumin: 3.9 g/dL (ref 3.5–5.0)
Alkaline Phosphatase: 62 U/L (ref 38–126)
Anion gap: 8 (ref 5–15)
BUN: 23 mg/dL — ABNORMAL HIGH (ref 6–20)
CHLORIDE: 106 mmol/L (ref 101–111)
CO2: 24 mmol/L (ref 22–32)
Calcium: 9.6 mg/dL (ref 8.9–10.3)
Creatinine, Ser: 1.11 mg/dL (ref 0.61–1.24)
GFR, EST NON AFRICAN AMERICAN: 60 mL/min — AB (ref >60)
Glucose, Bld: 86 mg/dL (ref 65–99)
POTASSIUM: 4.3 mmol/L (ref 3.5–5.1)
SODIUM: 138 mmol/L (ref 135–145)
Total Bilirubin: 0.4 mg/dL (ref 0.3–1.2)
Total Protein: 7.1 g/dL (ref 6.5–8.1)

## 2016-05-31 NOTE — Patient Instructions (Addendum)
[place] at [facility] Discharge Instructions  RECOMMENDATIONS MADE BY THE CONSULTANT AND ANY TEST RESULTS WILL BE SENT TO YOUR REFERRING PHYSICIAN.  You were seen today by Kirby Crigler PA-C. CT scans due in June. Return in 4 months for labs and follow up.   Thank you for choosing [place] at [facility] to provide your oncology and hematology care.  To afford each patient quality time with our provider, please arrive at least 15 minutes before your scheduled appointment time.    If you have a lab appointment with the [place] please come in thru the  Main Entrance and check in at the main information desk  You need to re-schedule your appointment should you arrive 10 or more minutes late.  We strive to give you quality time with our providers, and arriving late affects you and other patients whose appointments are after yours.  Also, if you no show three or more times for appointments you may be dismissed from the clinic at the providers discretion.     Again, thank you for choosing [facility].  Our hope is that these requests will decrease the amount of time that you wait before being seen by our physicians.       _____________________________________________________________  Should you have questions after your visit to [facility], please contact our office at [phone] between the hours of 8:30 a.m. and 4:30 p.m.  Voicemails left after 4:30 p.m. will not be returned until the following business day.  For prescription refill requests, have your pharmacy contact our office.       Resources For Cancer Patients and their Caregivers ? American Cancer Society: Can assist with transportation, wigs, general needs, runs Look Good Feel Better.        ([phone] ? Cancer Care: Provides financial assistance, online support groups, medication/co-pay assistance.  1-800-813-HOPE 828-062-4888) ? [place] Assists [place] cancer patients and their families through emotional , educational and financial support.  (914) [phone] ? [place] DSS Where to apply for food stamps, Medicaid and utility assistance. 732-163-8588 ? [employer]ATS: Transportation to medical appointments. (416)871-3534 ? Social Security Administration: May apply for disability if have a Stage IV cancer. (281) [phone] ? [employer], Disability and Transit Services: Assists with nutrition, care and transit needs. [place] Support Programs: @10RELATIVEDAYS @ > Cancer Support Group  2nd Tuesday of the month 1pm-2pm, Journey Room  > Creative Journey  3rd Tuesday of the month 1130am-1pm, Journey Room  > Look Good Feel Better  1st Wednesday of the month 10am-12 noon, Journey Room (Call [place] to register [phone])

## 2016-05-31 NOTE — Progress Notes (Signed)
Redmond School, MD [address] [place] [address]  Rectal cancer [facility]) - Plan: CEA, CEA, CBC with Differential, Comprehensive metabolic panel, CEA, CT Abdomen Pelvis W Contrast, CT Chest W Contrast  Prostate cancer [facility])  CURRENT THERAPY: Surveillance per NCCN guidelines.  INTERVAL HISTORY: DELRICO MINEHART 83 y.o. male returns for followup of Stage IIA (T3N0M0) adenocarcinoma of rectum, S/P resection by Dr. Leighton Ruff on 15/72/6203 demonstrating a 3.5 cm invasive adenocarcinoma invading through the muscularis propria, + LVI, and 0/11 lymph nodes.  Patient was not a neoadjuvant chemoXRT candidate due to previous XRT for prostate cancer.  He is S/P definitive resection.  Patient refused adjuvant chemotherapy with Xeloda.  Pre-operative CEA was WNL. AND H/O prostate cancer, S/P brachytherapy.    Prostate cancer ([place])   12/11/2014 Procedure    Prostatectomy at time of rectal ca resection      12/17/2014 Pathology Results    PROSTATE: PROSTATIC ADENOCARCINOMA WITHOUT TREATMENT EFFECT, GLEASON SCORE 4+4=8 THE TUMOR LOCATED AT THE LEFT ANTERIOR APEX AND THE MID MARGIN OF RESECTION ARE POSITIVE AT THE LEFT ANTERIOR APEX (0.4 CM, GLEASON SCORE 4+4=8) OTHER MARGINS ARE NEGATIVE FOR TUMOR       Rectal cancer ([place])   10/02/2014 Procedure    Colonoscopy by Dr. Carlean Purl- One-third circumferential medium mass was found in the distal rectum; ulcerated, firm anterior over prostate bed, multiple biopsies were performed using cold forceps- suspect carcinoma ? rectal vs recurrence of prostate      10/04/2014 Pathology Results    Rectum, biopsy ulcerated mass-invasive adenocarcinoma consistent with colorectal origin.  CDX 2 positive.  PSA negative.      10/07/2014 Initial Diagnosis    Rectal cancer ([place])     10/08/2014 Imaging    CT CAP- 1. Wall thickening of the rectum compatible with recently diagnosed rectal carcinoma. Tiny subcentimeter perirectal lymph nodes. 2.  Multiple bilateral pulmonary nodules as above. While these may be infectious or inflammatory in etiology, metastatic disease in the setting of known rectal carcinoma is not excluded. 3. Nodular soft tissue within the left mainstem bronchus which is nonspecific and may represent mucus however endobronchial nodule is not excluded. Recommend either attention on followup or correlation with bronchoscopy if the patient is having respiratory symptoms. 4. No evidence for focal hepatic lesion. 5. Bilateral low-attenuation renal lesions with internal densities greater than that of fluid, indeterminate. Recommend attention on followup or definitive characterization with pre and post contrast-enhanced MRI.      10/10/2014 Procedure    EUS by Dr. Ardis Hughs- Non-circumferential, 3.4cm [phone] (Stage IIIB) rectal adenocarcinoma along the anterior wall of the distal rectum with distal edge 1-2cm from the anal verge.      12/11/2014 Procedure    Segmental resection for tumor by Dr. Leighton Ruff      63/84/5364 Pathology Results    Colon, segmental resection for tumor, Bladder and Prostate COLON: INVASIVE COLONIC ADENOCARCINOMA (3.5 CM IN GREATEST DIMENSION) THE CARCINOMA INVASIVE THROUGH THE MUSCULARIS PROPRIA AND PRESENTS AT THE ANTERIOR MESORECTUM RESECTION MARGIN (1.5 CM, R2) THE PROXIMAL AND DISTAL RESECTION MARGINS ARE NEGATIVE FOR TUMOR LYMPHOVASCULAR INVASION IS IDENTIFED ELEVEN BENIGN LYMPH NODES (0/11) PROSTATE: PROSTATIC ADENOCARCINOMA WITHOUT TREATMENT EFFECT, GLEASON SCORE 4+4=8 THE TUMOR LOCATED AT THE LEFT ANTERIOR APEX AND THE MID MARGIN OF RESECTION ARE POSITIVE AT THE LEFT ANTERIOR APEX (0.4 CM, GLEASON SCORE 4+4=8) OTHER MARGINS ARE NEGATIVE FOR TUMOR BLADDER: BENIGN UROTHELIUM AND SUBCUTANOUS TISSUE NEGATIVE FOR MALIGNANCY  06/20/2015 Imaging    CT CAP- 1. Interval cystoprostatectomy, urinary diversion, abdominal perineal resection and sigmoid colostomy. 2. No evidence  of abdominal pelvic metastatic disease. 3. Stable small pulmonary nodules bilaterally. Based on stability over nearly 9 months, these are likely benign. Attention on continued follow-up recommended. 4. Stable low-density renal lesions bilaterally. 5. Moderate atherosclerosis.      12/31/2015 Procedure    Colonoscopy by Dr. Heron Sabins and colostomy in the sigmoid colon.  Diverticulosis in the left colon, examination otherwise normal.        HPI Elements Rectal ca  Location: Rectum  Quality: Adenocarcinoma  Severity: Stage IIIB  Duration: Dx in August 2016  Context: S/P brachytherapy for prostate precluding neoadjuvant chemoXRT  Timing:   Modifying Factors: Patient refused adjuvant therapy with Xeloda  Associated Signs & Symptoms:    Other than issues with his balance that has been chronic since surgery in 2016, he denies any complaints.  He denies any blood in his stool.  He denies any changes in his stool.  He denies any constipation or diarrhea.  He denies any changes in color, frequency, and caliber of stool.  He rates his appetite is 75%.  His energy level is at 75%.  He denies any pain.  Weight is stable.  He remains active and continues to golf.  Review of Systems  Constitutional: Negative.  Negative for chills, fever and weight loss.  HENT: Negative.   Eyes: Negative.   Respiratory: Negative.  Negative for cough.   Cardiovascular: Negative.  Negative for chest pain.  Gastrointestinal: Negative.  Negative for blood in stool, constipation, diarrhea, melena, nausea and vomiting.  Genitourinary: Negative.   Musculoskeletal: Negative.   Skin: Negative.   Neurological: Negative.  Negative for weakness.  Endo/Heme/Allergies: Negative.   Psychiatric/Behavioral: Negative.     Past Medical History:  Diagnosis Date  . Anal fissure   . Arthritis   . Cataract    left eye   . Colon polyps   . Colostomy in place [facility])   . GERD (gastroesophageal reflux disease)   .  High cholesterol   . History of hiatal hernia   . History of measles   . History of umbilical hernia   . History of urostomy   . HTN (hypertension)   . Imbalance   . Prostate cancer ([place])    in remission 2001. (seed implants)  . Rectal cancer ([place]) 10/07/2014   no chemo or radiation/ bladder was removed also  . Tinnitus     Past Surgical History:  Procedure Laterality Date  . APPENDECTOMY  1955  . COLONOSCOPY     removes 1 polp  . CYSTO N/A 12/11/2014   Procedure:  CYSTO,  PROSTATECTOMY, CYSTECTOMY colon conduit, BILATERAL LYMPH NODE DISSECTION;  Surgeon: Alexis Frock, MD;  Location: [facility];  Service: Urology;  Laterality: N/A;  . EUS N/A 10/10/2014   Procedure: LOWER ENDOSCOPIC ULTRASOUND (EUS);  Surgeon: Milus Banister, MD;  Location: [facility];  Service: Endoscopy;  Laterality: N/A;  . XI ROBOTIC ASSISTED LOWER ANTERIOR RESECTION N/A 12/11/2014   Procedure: XI ROBOTIC ASSISTED ABDOMINAL PERINEAL RESECTION OMENTOPLASTY;  Surgeon: Leighton Ruff, MD;  Location: [facility];  Service: General;  Laterality: N/A;    Family History  Problem Relation Age of Onset  . Diabetes Mother   . Heart disease Mother   . Heart disease Father   . Colon cancer Sister   . Prostate cancer Brother   . Esophageal cancer Neg Hx   . Rectal  cancer Neg Hx   . Stomach cancer Neg Hx     Social History   Social History  . Marital status: Married    Spouse name: N/A  . Number of children: 2  . Years of education: N/A   Occupational History  . retired    Social History Main Topics  . Smoking status: Never Smoker  . Smokeless tobacco: Never Used  . Alcohol use 0.0 oz/week     Comment: rarely  . Drug use: No  . Sexual activity: Not Asked   Other Topics Concern  . None   Social History Narrative  . None     PHYSICAL EXAMINATION  ECOG PERFORMANCE STATUS: 1 - Symptomatic but completely ambulatory  Vitals:   05/31/16 1428  BP: (!) 158/67  Pulse: 70  Resp: 16    GENERAL:alert, no  distress, well nourished, well developed, comfortable, cooperative, smiling and unaccompanied SKIN: skin color, texture, turgor are normal, no rashes or significant lesions HEAD: Normocephalic, No masses, lesions, tenderness or abnormalities EYES: normal, EOMI, Conjunctiva are pink and non-injected EARS: External ears normal OROPHARYNX:lips, buccal mucosa, and tongue normal and mucous membranes are moist  NECK: supple, no adenopathy, trachea midline LYMPH:  no palpable lymphadenopathy BREAST:not examined LUNGS: clear to auscultation  HEART: regular rate & rhythm ABDOMEN:abdomen soft and normal bowel sounds BACK: Back symmetric, no curvature. EXTREMITIES:less then 2 second capillary refill, no joint deformities, effusion, or inflammation, no skin discoloration, no cyanosis  NEURO: alert & oriented x 3 with fluent speech, no focal motor/sensory deficits, gait normal   LABORATORY DATA: CBC    Component Value Date/Time   WBC 9.2 05/31/2016 1350   RBC 4.53 05/31/2016 1350   HGB 14.4 05/31/2016 1350   HCT 43.1 05/31/2016 1350   PLT 196 05/31/2016 1350   MCV 95.1 05/31/2016 1350   MCH 31.8 05/31/2016 1350   MCHC 33.4 05/31/2016 1350   RDW 12.8 05/31/2016 1350   LYMPHSABS 3.1 05/31/2016 1350   MONOABS 1.1 (H) 05/31/2016 1350   EOSABS 0.3 05/31/2016 1350   BASOSABS 0.1 05/31/2016 1350      Chemistry      Component Value Date/Time   NA 138 05/31/2016 1350   K 4.3 05/31/2016 1350   CL 106 05/31/2016 1350   CO2 24 05/31/2016 1350   BUN 23 (H) 05/31/2016 1350   CREATININE 1.11 05/31/2016 1350      Component Value Date/Time   CALCIUM 9.6 05/31/2016 1350   ALKPHOS 62 05/31/2016 1350   AST 24 05/31/2016 1350   ALT 19 05/31/2016 1350   BILITOT 0.4 05/31/2016 1350      Lab Results  Component Value Date   CEA 1.7 02/12/2016     PENDING LABS:   RADIOGRAPHIC STUDIES:  No results found.   PATHOLOGY:    ASSESSMENT AND PLAN:  Rectal cancer ([place]) Stage IIA (T3N0M0)  adenocarcinoma of rectum, S/P resection by Dr. Leighton Ruff on 08/81/1031 demonstrating a 3.5 cm invasive adenocarcinoma invading through the muscularis propria, + LVI, and 0/11 lymph nodes.  Patient was not a neoadjuvant chemoXRT candidate due to previous XRT for prostate cancer.  He is S/P definitive resection.  Patient refused adjuvant chemotherapy with Xeloda.  Oncology history developed.  Staging in [facility] problem list completed.  Labs today: CBC diff, CMET, CEA.  I personally reviewed and went over laboratory results with the patient.  The results are noted within this dictation.  CBC is WNL. CMET is WNL as well. CEA is pending  Labs in 4 months: CBC diff, CMET, CEA.   He is due for annual imaging in June 2018. His last imaging was on 06/20/2015.  I personally reviewed and went over radiographic studies with the patient.  The results are noted within this dictation.  I personally reviewed the images in PACS.  No recurrence of disease noted, but pulmonary nodule is noted and stable over 9 months of surveillance.    Order is placed for CT CAP in June 2018.  Return in 4 months for follow-up.  If all is well in 4 months, he will have completed 2 months of close surveillance in accordance with the NCCN guidelines and therefore decreasing frequency of follow-up visits to every 6 months would be reasonable.  Prostate cancer [facility]) H/O prostate cancer, S/P brachytherapy followed by prostatectomy at time of surgery for rectal cancer.  Pathology at time of resection demonstrated adenocarcinoma without treatment effect, Gleason score 4+4=8 in the left anterior apex.  Positive margins at the mid-margin and left anterior apex (0.4 cm, gleason score 4+4=8)- other margins are negative for disease.   Number of Diagnoses or Treatment Options- Section A:      Problems to Exam Physician Problem(s) Number x Points= Results  Self-limited or minor (stable, improved, or worsening)  Max=2  1 1   Est. Problem (to  examiner); stable, improved   1 2  Est. Problem (to examiner); worsening   2   New problem (to examiner); no additional work-up planned  Max=1 3   New problem (to examiner); add. work-up planned   4      Total: 3    Amount and/or Complexity of Data to be Reviewed- Section B    Data to be reviewed: Points    Review and/or order of clinical lab tests 1  [x]    Review and/or order of tests in the radiology section of CPT (includes nuclear med & other except cardiac cath & ECG) 1  [x]    Review and/or order of tests in the medicine section of CPT (e.g. EKG, cardiac cath, non-invasive vascular studies, pulmonary function studies) 1  []    Discussion of test results with performing physician 1  []    Decision to obtain old records and/or obtaining history from someone other than patient 1  []    Review and summarization of old records and/or obtaining history from someone other than patient and/or discussion of case with another health care provider 2  [x]    Independent visualization of image, tracing, or specimen itself (not simply review report) 2  [x]    Total:   6    Risk of  complications and/or Morbidity or Mortality- Section C  Level of Risk: Presenting Problem(s) Diagnostic Procedure(s) Ordered Management Options Selected  Minimal One self-limited or minor problem (eg cold, insect bite, tinea corporis)  Lab test requiring venipuncture  Chest xray  EKG/EEG  [place] or Echo  KOH prep  Urinalysis  Rest  Gargles  Elastic bandages  Superficial dressings  Low  Two or more self-limited or minor problems  One stable chronic illness (well-controlled HTN, non-insulin dependent diabetes, cataract, BPH)  Acute uncomplicated illness or injury (cystitis, allergic rhinitis, simple sprain)  Physiologic test not under stress (pulm fnx tests)  Non-cardiovascular imaging studies with contrast (barium enema)  Superficial needle biopsy  Clinical laboratory tests requiring arterial  puncture  Skin biopsies  OTC drugs  Minor surgery with no identified risk factors  Physical therapy  Occupational therapy  IV fluids without additives  Moderate  One or more chronic illnesses with mild exacerbation, progression, or side effects of treatment  Two or more stable chronic illnesses  Undiagnosed new problem with uncertain prognosis (lump in breast)  Acute illness with systemic symptoms (pyelonephritis, pneumonitis, colitis)  Acute complicated injury (head injury with brief loss of consciousness)  Physiologic test under stress (cardiac stress test, fetal contraction stress test)  Diagnostic endoscopies with no identified risk factors  Deep needle or incisional biopsy  Cardiovascular imaging studies with contrast and no identified risk factors (arteriogram, cardiac cath)  Obtain fluid from body cavity (LP, thoracentesis, culdocentesis)  Minor surgery with identified risk factors  Elective surgery (open, percutaneous, or endoscopic) with no identified risk factors  Prescription drug management  Therapeutic nuclear medicine  IV fluids with additives  Closed treatment of fracture of dislocation without manipulation  High  One or more chronic illnesses with severe exacerbation, progression, or side effects of treatment.  Acute or chronic illnesses or injuries that may pose a threat to life or bodily function (multiple trauma, acute MI, PE, severe respiratory distress, progressive severe rheumatoid arthritis, psychiatric illness with potential threat to self or others, peritonitis, acute renal failure)  An abrupt change in neurological status (seizure, TIA, weakness, sensory loss)  Cardiovascular imaging studies with contrast with identified risk factors  Cardiac electrophysiological tests  Diagnostic endoscopies with identified risk factors  Discography   Elective major surgery (open, percutaneous, or endoscopic) with identified risk factor  Emergency  major surgery (open, percutaneous, or endoscopic)  Parental controlled substances  Drug therapy requiring intensive monitoring for toxicity  Decision not to resuscitate or deescalate care because of poor prognosis     Final Result of Complexity      Choose decision making level with 2 or 3 checks OR choose the decision making level on Section B       A Number of diagnoses or treatment options  []   </= 1 Minimal  []   2 Limited  [x]   3 Multiple  []   >/= 4 Extensive  B Amount and complexity of data  []   </= 1 Minimal or low  []   2 Limited  []   3  Moderate  [x]   >/= 4 Extensive  C Highest risk  []   Minimal  []   Low  [x]   Moderate  []   High   Type of decision making  []   Straight-forward  []   Low Complexity  [x]   Moderate- Complexity  []   High- Complexity     ORDERS PLACED FOR THIS ENCOUNTER: Orders Placed This Encounter  Procedures  . CT Abdomen Pelvis W Contrast  . CT Chest W Contrast  . CEA  . CBC with Differential  . Comprehensive metabolic panel  . CEA    MEDICATIONS PRESCRIBED THIS ENCOUNTER: No orders of the defined types were placed in this encounter.   THERAPY PLAN:  NCCN guidelines regarding surveillance for rectal cancer are as follows (1.2018):  1. Stage I with full surgical staging:   A. Colonoscopy 1 year    1. If advanced adenoma, repeat in 1 year.    2. If no advanced adenoma, repeat in 3 years, then every 5 years  2. Stage II, III:   A. History and physical every 3-6 months for 2 years, then every 6 months for a total of 5 years.   B. CEA every 3-6 months for 2 years, then every 6 months for a total of 5 years.   C. Chest/abdominal/pelvic CT every 6-12 months (category 2b  for frequency less than 12 months) for a total of 5 years.   D. Colonoscopy in 1 year except if no preoperative colonoscopy due to obstructing lesion, colonoscopy in 3-6 months.    1. If advanced adenoma, repeat in 1 year    2. If no advanced adenoma, repeat in 3  years, then every 5 years.   E. PET/CT scan is not recommended.  3. Stage IV:   A. History and physical every 3-6 months for 2 years, then every 6 months for a total of 5 years.   B. CEA every 3-6 months for 2 years, then every 6 months for a total of 5 years.   C. Chest/abdominal/pelvic CT every 3-6 months (category 2b for frequency less than 6 months) for 2 years, then every 6-12 months for a total of 5 years.   D. Colonoscopy in 1 year except if no preoperative colonoscopy due to obstructing lesion, colonoscopy in 3-6 months.    1. If advanced adenoma, repeat in 1 year    2. If no advanced adenoma, repeat in 3 years, then every 5 years.   E. PET/CT scan is not recommended.  All questions were answered. The patient knows to call the clinic with any problems, questions or concerns. We can certainly see the patient much sooner if necessary.  Patient and plan discussed with Dr. Twana First and she is in agreement with the aforementioned.   This note is electronically signed by: Doy Mince 05/31/2016 3:11 PM

## 2016-05-31 NOTE — Assessment & Plan Note (Addendum)
Stage IIA (T3N0M0) adenocarcinoma of rectum, S/P resection by Dr. Leighton Ruff on 16/01/930 demonstrating a 3.5 cm invasive adenocarcinoma invading through the muscularis propria, + LVI, and 0/11 lymph nodes.  Patient was not a neoadjuvant chemoXRT candidate due to previous XRT for prostate cancer.  He is S/P definitive resection.  Patient refused adjuvant chemotherapy with Xeloda.  Oncology history developed.  Staging in [facility] problem list completed.  Labs today: CBC diff, CMET, CEA.  I personally reviewed and went over laboratory results with the patient.  The results are noted within this dictation.  CBC is WNL. CMET is WNL as well. CEA is pending  Labs in 4 months: CBC diff, CMET, CEA.   He is due for annual imaging in June 2018. His last imaging was on 06/20/2015.  I personally reviewed and went over radiographic studies with the patient.  The results are noted within this dictation.  I personally reviewed the images in PACS.  No recurrence of disease noted, but pulmonary nodule is noted and stable over 9 months of surveillance.    Order is placed for CT CAP in June 2018.  Return in 4 months for follow-up.  If all is well in 4 months, he will have completed 2 months of close surveillance in accordance with the NCCN guidelines and therefore decreasing frequency of follow-up visits to every 6 months would be reasonable.

## 2016-05-31 NOTE — Assessment & Plan Note (Signed)
H/O prostate cancer, S/P brachytherapy followed by prostatectomy at time of surgery for rectal cancer.  Pathology at time of resection demonstrated adenocarcinoma without treatment effect, Gleason score 4+4=8 in the left anterior apex.  Positive margins at the mid-margin and left anterior apex (0.4 cm, gleason score 4+4=8)- other margins are negative for disease.

## 2016-06-01 LAB — CEA: CEA: 1.8 ng/mL (ref 0.0–4.7)

## 2016-06-21 ENCOUNTER — Encounter (HOSPITAL_COMMUNITY): Payer: Self-pay

## 2016-06-21 ENCOUNTER — Ambulatory Visit (HOSPITAL_COMMUNITY)
Admission: RE | Admit: 2016-06-21 | Discharge: 2016-06-21 | Disposition: A | Discharge status: Home / Self Care | Payer: PPO | Source: Ambulatory Visit | Attending: Oncology | Admitting: Oncology

## 2016-06-21 DIAGNOSIS — Z85048 Personal history of other malignant neoplasm of rectum, rectosigmoid junction, and anus: Secondary | ICD-10-CM | POA: Insufficient documentation

## 2016-06-21 DIAGNOSIS — C20 Malignant neoplasm of rectum: Secondary | ICD-10-CM | POA: Diagnosis not present

## 2016-06-21 DIAGNOSIS — R918 Other nonspecific abnormal finding of lung field: Secondary | ICD-10-CM | POA: Diagnosis not present

## 2016-06-21 DIAGNOSIS — K8020 Calculus of gallbladder without cholecystitis without obstruction: Secondary | ICD-10-CM | POA: Insufficient documentation

## 2016-06-21 DIAGNOSIS — I700 Atherosclerosis of aorta: Secondary | ICD-10-CM | POA: Insufficient documentation

## 2016-06-21 DIAGNOSIS — I2510 Atherosclerotic heart disease of native coronary artery without angina pectoris: Secondary | ICD-10-CM | POA: Insufficient documentation

## 2016-06-21 DIAGNOSIS — C61 Malignant neoplasm of prostate: Secondary | ICD-10-CM | POA: Diagnosis not present

## 2016-06-21 MED ORDER — IOPAMIDOL (ISOVUE-300) INJECTION 61%
100.0000 mL | Freq: Once | INTRAVENOUS | Status: AC | PRN
  Administered 2016-06-21: 100 mL via INTRAVENOUS

## 2016-08-02 ENCOUNTER — Ambulatory Visit (INDEPENDENT_AMBULATORY_CARE_PROVIDER_SITE_OTHER): Payer: PPO | Admitting: Otolaryngology

## 2016-08-02 DIAGNOSIS — H9202 Otalgia, left ear: Secondary | ICD-10-CM | POA: Diagnosis not present

## 2016-08-04 DIAGNOSIS — Z6830 Body mass index (BMI) 30.0-30.9, adult: Secondary | ICD-10-CM | POA: Diagnosis not present

## 2016-08-04 DIAGNOSIS — Z0000 Encounter for general adult medical examination without abnormal findings: Secondary | ICD-10-CM | POA: Diagnosis not present

## 2016-08-04 DIAGNOSIS — E6609 Other obesity due to excess calories: Secondary | ICD-10-CM | POA: Diagnosis not present

## 2016-10-01 ENCOUNTER — Other Ambulatory Visit (HOSPITAL_COMMUNITY): Payer: PPO

## 2016-10-01 ENCOUNTER — Ambulatory Visit (HOSPITAL_COMMUNITY): Payer: PPO | Admitting: Oncology

## 2016-10-04 ENCOUNTER — Encounter (HOSPITAL_COMMUNITY): Payer: PPO

## 2016-10-04 ENCOUNTER — Encounter (HOSPITAL_COMMUNITY): Payer: Self-pay | Admitting: Oncology

## 2016-10-04 ENCOUNTER — Encounter (HOSPITAL_COMMUNITY): Payer: PPO | Attending: Oncology | Admitting: Oncology

## 2016-10-04 VITALS — BP 160/72 | HR 69 | Resp 16 | Ht 67.0 in | Wt 195.0 lb

## 2016-10-04 DIAGNOSIS — K219 Gastro-esophageal reflux disease without esophagitis: Secondary | ICD-10-CM | POA: Insufficient documentation

## 2016-10-04 DIAGNOSIS — R918 Other nonspecific abnormal finding of lung field: Secondary | ICD-10-CM | POA: Diagnosis not present

## 2016-10-04 DIAGNOSIS — C20 Malignant neoplasm of rectum: Secondary | ICD-10-CM

## 2016-10-04 DIAGNOSIS — Z8546 Personal history of malignant neoplasm of prostate: Secondary | ICD-10-CM | POA: Diagnosis not present

## 2016-10-04 DIAGNOSIS — C61 Malignant neoplasm of prostate: Secondary | ICD-10-CM

## 2016-10-04 DIAGNOSIS — E7800 Pure hypercholesterolemia, unspecified: Secondary | ICD-10-CM | POA: Insufficient documentation

## 2016-10-04 DIAGNOSIS — Z933 Colostomy status: Secondary | ICD-10-CM | POA: Diagnosis not present

## 2016-10-04 DIAGNOSIS — I10 Essential (primary) hypertension: Secondary | ICD-10-CM | POA: Diagnosis not present

## 2016-10-04 LAB — CBC WITH DIFFERENTIAL/PLATELET
Basophils Absolute: 0.1 10*3/uL (ref 0.0–0.1)
Basophils Relative: 1 %
EOS ABS: 0.2 10*3/uL (ref 0.0–0.7)
EOS PCT: 2 %
HCT: 44.9 % (ref 39.0–52.0)
Hemoglobin: 15.1 g/dL (ref 13.0–17.0)
LYMPHS ABS: 2.6 10*3/uL (ref 0.7–4.0)
Lymphocytes Relative: 26 %
MCH: 31.1 pg (ref 26.0–34.0)
MCHC: 33.6 g/dL (ref 30.0–36.0)
MCV: 92.6 fL (ref 78.0–100.0)
MONOS PCT: 10 %
Monocytes Absolute: 1 10*3/uL (ref 0.1–1.0)
Neutro Abs: 6 10*3/uL (ref 1.7–7.7)
Neutrophils Relative %: 61 %
PLATELETS: 188 10*3/uL (ref 150–400)
RBC: 4.85 MIL/uL (ref 4.22–5.81)
RDW: 13.4 % (ref 11.5–15.5)
WBC: 9.8 10*3/uL (ref 4.0–10.5)

## 2016-10-04 LAB — COMPREHENSIVE METABOLIC PANEL
ALK PHOS: 54 U/L (ref 38–126)
ALT: 19 U/L (ref 17–63)
AST: 24 U/L (ref 15–41)
Albumin: 3.9 g/dL (ref 3.5–5.0)
Anion gap: 7 (ref 5–15)
BUN: 19 mg/dL (ref 6–20)
CALCIUM: 9.2 mg/dL (ref 8.9–10.3)
CHLORIDE: 107 mmol/L (ref 101–111)
CO2: 23 mmol/L (ref 22–32)
Creatinine, Ser: 1.26 mg/dL — ABNORMAL HIGH (ref 0.61–1.24)
GFR, EST AFRICAN AMERICAN: 59 mL/min — AB (ref >60)
GFR, EST NON AFRICAN AMERICAN: 51 mL/min — AB (ref >60)
Glucose, Bld: 107 mg/dL — ABNORMAL HIGH (ref 65–99)
Potassium: 4.1 mmol/L (ref 3.5–5.1)
SODIUM: 137 mmol/L (ref 135–145)
Total Bilirubin: 0.6 mg/dL (ref 0.3–1.2)
Total Protein: 6.9 g/dL (ref 6.5–8.1)

## 2016-10-04 NOTE — Progress Notes (Signed)
[employer], [place] [address]  No diagnosis found.  CURRENT THERAPY: Surveillance per NCCN guidelines.  INTERVAL HISTORY: Ian Christensen 83 y.o. male returns for followup of Stage IIA (T3N0M0) adenocarcinoma of rectum, S/P resection by Dr. Leighton Ruff on 32/99/2426 demonstrating a 3.5 cm invasive adenocarcinoma invading through the muscularis propria, + LVI, and 0/11 lymph nodes.  Patient was not a neoadjuvant chemoXRT candidate due to previous XRT for prostate cancer.  He is S/P definitive resection.  Patient refused adjuvant chemotherapy with Xeloda.  Pre-operative CEA was WNL. AND H/O prostate cancer, S/P brachytherapy. October 04, 2016 Patient s here for ongoing evaluation and treatment consideration,  Patient has gained weight. No other abdominal symptoms Appetite has been stable   Prostate cancer ([place])   12/11/2014 Procedure    Prostatectomy at time of rectal ca resection      12/17/2014 Pathology Results    PROSTATE: PROSTATIC ADENOCARCINOMA WITHOUT TREATMENT EFFECT, GLEASON SCORE 4+4=8 THE TUMOR LOCATED AT THE LEFT ANTERIOR APEX AND THE MID MARGIN OF RESECTION ARE POSITIVE AT THE LEFT ANTERIOR APEX (0.4 CM, GLEASON SCORE 4+4=8) OTHER MARGINS ARE NEGATIVE FOR TUMOR       Rectal cancer ([place])   10/02/2014 Procedure    Colonoscopy by Dr. Carlean Purl- One-third circumferential medium mass was found in the distal rectum; ulcerated, firm anterior over prostate bed, multiple biopsies were performed using cold forceps- suspect carcinoma ? rectal vs recurrence of prostate      10/04/2014 Pathology Results    Rectum, biopsy ulcerated mass-invasive adenocarcinoma consistent with colorectal origin.  CDX 2 positive.  PSA negative.      10/07/2014 Initial Diagnosis    Rectal cancer ([place])     10/08/2014 Imaging    CT CAP- 1. Wall thickening of the rectum compatible with recently diagnosed rectal carcinoma. Tiny subcentimeter  perirectal lymph nodes. 2. Multiple bilateral pulmonary nodules as above. While these may be infectious or inflammatory in etiology, metastatic disease in the setting of known rectal carcinoma is not excluded. 3. Nodular soft tissue within the left mainstem bronchus which is nonspecific and may represent mucus however endobronchial nodule is not excluded. Recommend either attention on followup or correlation with bronchoscopy if the patient is having respiratory symptoms. 4. No evidence for focal hepatic lesion. 5. Bilateral low-attenuation renal lesions with internal densities greater than that of fluid, indeterminate. Recommend attention on followup or definitive characterization with pre and post contrast-enhanced MRI.      10/10/2014 Procedure    EUS by Dr. Ardis Hughs- Non-circumferential, 3.4cm [phone] (Stage IIIB) rectal adenocarcinoma along the anterior wall of the distal rectum with distal edge 1-2cm from the anal verge.      12/11/2014 Procedure    Segmental resection for tumor by Dr. Leighton Ruff      22/29/7989 Pathology Results    Colon, segmental resection for tumor, Bladder and Prostate COLON: INVASIVE COLONIC ADENOCARCINOMA (3.5 CM IN GREATEST DIMENSION) THE CARCINOMA INVASIVE THROUGH THE MUSCULARIS PROPRIA AND PRESENTS AT THE ANTERIOR MESORECTUM RESECTION MARGIN (1.5 CM, R2) THE PROXIMAL AND DISTAL RESECTION MARGINS ARE NEGATIVE FOR TUMOR LYMPHOVASCULAR INVASION IS IDENTIFED ELEVEN BENIGN LYMPH NODES (0/11) PROSTATE: PROSTATIC ADENOCARCINOMA WITHOUT TREATMENT EFFECT, GLEASON SCORE 4+4=8 THE TUMOR LOCATED AT THE LEFT ANTERIOR APEX AND THE MID MARGIN OF RESECTION ARE POSITIVE AT THE LEFT ANTERIOR APEX (0.4 CM, GLEASON SCORE 4+4=8) OTHER MARGINS ARE NEGATIVE FOR TUMOR BLADDER: BENIGN UROTHELIUM AND SUBCUTANOUS TISSUE NEGATIVE FOR MALIGNANCY  06/20/2015 Imaging    CT CAP- 1. Interval cystoprostatectomy, urinary diversion, abdominal perineal resection and sigmoid  colostomy. 2. No evidence of abdominal pelvic metastatic disease. 3. Stable small pulmonary nodules bilaterally. Based on stability over nearly 9 months, these are likely benign. Attention on continued follow-up recommended. 4. Stable low-density renal lesions bilaterally. 5. Moderate atherosclerosis.      12/31/2015 Procedure    Colonoscopy by Dr. Heron Sabins and colostomy in the sigmoid colon.  Diverticulosis in the left colon, examination otherwise normal.      06/21/2016 Imaging    CT CAP- Stable exam. No definite evidence of recurrent or metastatic carcinoma.  Stable sub-cm noncalcified bilateral pulmonary nodules, most consistent with benign postinflammatory etiology. Recommend continued attention on follow-up imaging.  Cholelithiasis. No radiographic evidence of cholecystitis.  Aortic and coronary artery atherosclerosis.        HPI Elements Rectal ca  Location: Rectum  Quality: Adenocarcinoma  Severity: Stage IIIB  Duration: Dx in August 2016  Context: S/P brachytherapy for prostate precluding neoadjuvant chemoXRT  Timing:   Modifying Factors: Patient refused adjuvant therapy with Xeloda  Associated Signs & Symptoms:    Other than issues with his balance that has been chronic since surgery in 2016, he denies any complaints.  He denies any blood in his stool.  He denies any changes in his stool.  He denies any constipation or diarrhea.  He denies any changes in color, frequency, and caliber of stool.  He rates his appetite is 75%.  His energy level is at 75%.  He denies any pain.  Weight is stable.  He remains active and continues to golf.  Review of Systems  Constitutional: Negative.  Negative for chills, fever and weight loss.  HENT: Negative.   Eyes: Negative.   Respiratory: Negative.  Negative for cough.   Cardiovascular: Negative.  Negative for chest pain.  Gastrointestinal: Negative.  Negative for blood in stool, constipation, diarrhea, melena, nausea  and vomiting.  Genitourinary: Negative.   Musculoskeletal: Negative.   Skin: Negative.   Neurological: Negative.  Negative for weakness.  Endo/Heme/Allergies: Negative.   Psychiatric/Behavioral: Negative.     Past Medical History:  Diagnosis Date  . Anal fissure   . Arthritis   . Cataract    left eye   . Colon polyps   . Colostomy in place [facility])   . GERD (gastroesophageal reflux disease)   . High cholesterol   . History of hiatal hernia   . History of measles   . History of umbilical hernia   . History of urostomy   . HTN (hypertension)   . Imbalance   . Prostate cancer ([place])    in remission 2001. (seed implants)  . Rectal cancer ([place]) 10/07/2014   no chemo or radiation/ bladder was removed also  . Tinnitus     Past Surgical History:  Procedure Laterality Date  . APPENDECTOMY  1955  . COLONOSCOPY     removes 1 polp  . CYSTO N/A 12/11/2014   Procedure:  CYSTO,  PROSTATECTOMY, CYSTECTOMY colon conduit, BILATERAL LYMPH NODE DISSECTION;  Surgeon: Alexis Frock, MD;  Location: [facility];  Service: Urology;  Laterality: N/A;  . EUS N/A 10/10/2014   Procedure: LOWER ENDOSCOPIC ULTRASOUND (EUS);  Surgeon: Milus Banister, MD;  Location: [facility];  Service: Endoscopy;  Laterality: N/A;  . XI ROBOTIC ASSISTED LOWER ANTERIOR RESECTION N/A 12/11/2014   Procedure: XI ROBOTIC ASSISTED ABDOMINAL PERINEAL RESECTION OMENTOPLASTY;  Surgeon: Leighton Ruff, MD;  Location: [facility];  Service:  General;  Laterality: N/A;    Family History  Problem Relation Age of Onset  . Diabetes Mother   . Heart disease Mother   . Heart disease Father   . Colon cancer Sister   . Prostate cancer Brother   . Esophageal cancer Neg Hx   . Rectal cancer Neg Hx   . Stomach cancer Neg Hx     Social History   Social History  . Marital status: Married    Spouse name: N/A  . Number of children: 2  . Years of education: N/A   Occupational History  . retired    Social History Main Topics  . Smoking  status: Never Smoker  . Smokeless tobacco: Never Used  . Alcohol use 0.0 oz/week     Comment: rarely  . Drug use: No  . Sexual activity: Not on file   Other Topics Concern  . Not on file   Social History Narrative  . No narrative on file     PHYSICAL EXAMINATION  ECOG PERFORMANCE STATUS: 0  There were no vitals filed for this visit.  GENERAL:alert, no distress, well nourished, well developed, comfortable, cooperative, smiling and unaccompanied SKIN: skin color, texture, turgor are normal, no rashes or significant lesions HEAD: Normocephalic, No masses, lesions, tenderness or abnormalities EYES: normal, EOMI, Conjunctiva are pink and non-injected EARS: External ears normal OROPHARYNX:lips, buccal mucosa, and tongue normal and mucous membranes are moist  NECK: supple, no adenopathy, trachea midline LYMPH:  no palpable lymphadenopathy BREAST:not examined LUNGS: clear to auscultation  HEART: regular rate & rhythm ABDOMEN:abdomen soft and normal bowel sounds BACK: Back symmetric, no curvature. EXTREMITIES:less then 2 second capillary refill, no joint deformities, effusion, or inflammation, no skin discoloration, no cyanosis  NEURO: alert & oriented x 3 with fluent speech, no focal motor/sensory deficits, gait normal Patient does not remember when was the last colonoscopy however has an appointment to see GI physician next month  LABORATORY DATA: CBC    Component Value Date/Time   WBC 9.8 10/04/2016 1355   RBC 4.85 10/04/2016 1355   HGB 15.1 10/04/2016 1355   HCT 44.9 10/04/2016 1355   PLT 188 10/04/2016 1355   MCV 92.6 10/04/2016 1355   MCH 31.1 10/04/2016 1355   MCHC 33.6 10/04/2016 1355   RDW 13.4 10/04/2016 1355   LYMPHSABS 2.6 10/04/2016 1355   MONOABS 1.0 10/04/2016 1355   EOSABS 0.2 10/04/2016 1355   BASOSABS 0.1 10/04/2016 1355      Chemistry      Component Value Date/Time   NA 138 05/31/2016 1350   K 4.3 05/31/2016 1350   CL 106 05/31/2016 1350   CO2  24 05/31/2016 1350   BUN 23 (H) 05/31/2016 1350   CREATININE 1.11 05/31/2016 1350      Component Value Date/Time   CALCIUM 9.6 05/31/2016 1350   ALKPHOS 62 05/31/2016 1350   AST 24 05/31/2016 1350   ALT 19 05/31/2016 1350   BILITOT 0.4 05/31/2016 1350      Lab Results  Component Value Date   CEA 1.8 05/31/2016     PENDING LABS:  CEA  pending RADIOGRAPHIC STUDIES: CT scan of abdomen and pelvis in June of 2018 has been evaluated nd reviewed there is no evidence of recurrent or metastatic disease No results found.   PATHOLOGY:    ASSESSMENT AND PLAN:  Cancer  of rectum stage IIA. CEA slightly elevated on a rising trend Recent CEA is pending CT scan in June of 2018 has been  reported to be negative Repeat CT scan in January or February of 2019 Patient has an appointment with GI physician next month and he is going to address the issue of doing colonoscopy Patient has a subtle pulmonary nodules which are stable on June of 2018 (CT scan) Needs a yearly checkup  Number of Diagnoses or Treatment Options- Section A:      Problems to Exam Physician Problem(s) Number x Points= Results  Self-limited or minor (stable, improved, or worsening)  Max=2  1 1   Est. Problem (to examiner); stable, improved   1 2  Est. Problem (to examiner); worsening   2   New problem (to examiner); no additional work-up planned  Max=1 3   New problem (to examiner); add. work-up planned   4      Total: 3    Amount and/or Complexity of Data to be Reviewed- Section B    Data to be reviewed: Points    Review and/or order of clinical lab tests 1  [x]    Review and/or order of tests in the radiology section of CPT (includes nuclear med & other except cardiac cath & ECG) 1  [x]    Review and/or order of tests in the medicine section of CPT (e.g. EKG, cardiac cath, non-invasive vascular studies, pulmonary function studies) 1  []    Discussion of test results with performing physician 1  []    Decision to  obtain old records and/or obtaining history from someone other than patient 1  []    Review and summarization of old records and/or obtaining history from someone other than patient and/or discussion of case with another health care provider 2  [x]    Independent visualization of image, tracing, or specimen itself (not simply review report) 2  [x]    Total:   6    Risk of  complications and/or Morbidity or Mortality- Section C  Level of Risk: Presenting Problem(s) Diagnostic Procedure(s) Ordered Management Options Selected  Minimal One self-limited or minor problem (eg cold, insect bite, tinea corporis)  Lab test requiring venipuncture  Chest xray  EKG/EEG  [place] or Echo  KOH prep  Urinalysis  Rest  Gargles  Elastic bandages  Superficial dressings  Low  Two or more self-limited or minor problems  One stable chronic illness (well-controlled HTN, non-insulin dependent diabetes, cataract, BPH)  Acute uncomplicated illness or injury (cystitis, allergic rhinitis, simple sprain)  Physiologic test not under stress (pulm fnx tests)  Non-cardiovascular imaging studies with contrast (barium enema)  Superficial needle biopsy  Clinical laboratory tests requiring arterial puncture  Skin biopsies  OTC drugs  Minor surgery with no identified risk factors  Physical therapy  Occupational therapy  IV fluids without additives  Moderate  One or more chronic illnesses with mild exacerbation, progression, or side effects of treatment  Two or more stable chronic illnesses  Undiagnosed new problem with uncertain prognosis (lump in breast)  Acute illness with systemic symptoms (pyelonephritis, pneumonitis, colitis)  Acute complicated injury (head injury with brief loss of consciousness)  Physiologic test under stress (cardiac stress test, fetal contraction stress test)  Diagnostic endoscopies with no identified risk factors  Deep needle or incisional biopsy  Cardiovascular imaging  studies with contrast and no identified risk factors (arteriogram, cardiac cath)  Obtain fluid from body cavity (LP, thoracentesis, culdocentesis)  Minor surgery with identified risk factors  Elective surgery (open, percutaneous, or endoscopic) with no identified risk factors  Prescription drug management  Therapeutic nuclear medicine  IV fluids with additives  Closed treatment  of fracture of dislocation without manipulation  High  One or more chronic illnesses with severe exacerbation, progression, or side effects of treatment.  Acute or chronic illnesses or injuries that may pose a threat to life or bodily function (multiple trauma, acute MI, PE, severe respiratory distress, progressive severe rheumatoid arthritis, psychiatric illness with potential threat to self or others, peritonitis, acute renal failure)  An abrupt change in neurological status (seizure, TIA, weakness, sensory loss)  Cardiovascular imaging studies with contrast with identified risk factors  Cardiac electrophysiological tests  Diagnostic endoscopies with identified risk factors  Discography   Elective major surgery (open, percutaneous, or endoscopic) with identified risk factor  Emergency major surgery (open, percutaneous, or endoscopic)  Parental controlled substances  Drug therapy requiring intensive monitoring for toxicity  Decision not to resuscitate or deescalate care because of poor prognosis     Final Result of Complexity      Choose decision making level with 2 or 3 checks OR choose the decision making level on Section B       A Number of diagnoses or treatment options  []   </= 1 Minimal  []   2 Limited  [x]   3 Multiple  []   >/= 4 Extensive  B Amount and complexity of data  []   </= 1 Minimal or low  []   2 Limited  []   3  Moderate  [x]   >/= 4 Extensive  C Highest risk  []   Minimal  []   Low  [x]   Moderate  []   High   Type of decision making  []   Straight-forward  []   Low  Complexity  [x]   Moderate- Complexity  []   High- Complexity     ORDERS PLACED FOR THIS ENCOUNTER: No orders of the defined types were placed in this encounter.   MEDICATIONS PRESCRIBED THIS ENCOUNTER: No orders of the defined types were placed in this encounter.   THERAPY PLAN:  NCCN guidelines regarding surveillance for rectal cancer are as follows (1.2018):  1. Stage I with full surgical staging:   A. Colonoscopy 1 year    1. If advanced adenoma, repeat in 1 year.    2. If no advanced adenoma, repeat in 3 years, then every 5 years  2. Stage II, III:   A. History and physical every 3-6 months for 2 years, then every 6 months for a total of 5 years.   B. CEA every 3-6 months for 2 years, then every 6 months for a total of 5 years.   C. Chest/abdominal/pelvic CT every 6-12 months (category 2b for frequency less than 12 months) for a total of 5 years.   D. Colonoscopy in 1 year except if no preoperative colonoscopy due to obstructing lesion, colonoscopy in 3-6 months.    1. If advanced adenoma, repeat in 1 year    2. If no advanced adenoma, repeat in 3 years, then every 5 years.   E. PET/CT scan is not recommended.  3. Stage IV:   A. History and physical every 3-6 months for 2 years, then every 6 months for a total of 5 years.   B. CEA every 3-6 months for 2 years, then every 6 months for a total of 5 years.   C. Chest/abdominal/pelvic CT every 3-6 months (category 2b for frequency less than 6 months) for 2 years, then every 6-12 months for a total of 5 years.   D. Colonoscopy in 1 year except if no preoperative colonoscopy due to obstructing lesion, colonoscopy in  3-6 months.    1. If advanced adenoma, repeat in 1 year    2. If no advanced adenoma, repeat in 3 years, then every 5 years.   E. PET/CT scan is not recommended.   This note is electronically signed by: Forest Gleason, MD 10/04/2016 2:28 PM

## 2016-10-04 NOTE — Addendum Note (Signed)
Addended by: Forest Gleason on: 10/04/2016 02:52 PM   Modules accepted: Orders

## 2016-10-05 LAB — CEA: CEA: 1.7 ng/mL (ref 0.0–4.7)

## 2016-10-12 DIAGNOSIS — Z933 Colostomy status: Secondary | ICD-10-CM | POA: Diagnosis not present

## 2016-10-12 DIAGNOSIS — Z936 Other artificial openings of urinary tract status: Secondary | ICD-10-CM | POA: Diagnosis not present

## 2016-10-12 DIAGNOSIS — C20 Malignant neoplasm of rectum: Secondary | ICD-10-CM | POA: Diagnosis not present

## 2016-11-02 ENCOUNTER — Other Ambulatory Visit: Payer: Self-pay | Admitting: Urology

## 2016-11-02 DIAGNOSIS — N99523 Herniation of incontinent stoma of urinary tract: Secondary | ICD-10-CM

## 2016-11-15 ENCOUNTER — Ambulatory Visit (HOSPITAL_COMMUNITY)
Admission: RE | Admit: 2016-11-15 | Discharge: 2016-11-15 | Disposition: A | Discharge status: Home / Self Care | Payer: PPO | Source: Ambulatory Visit | Attending: Urology | Admitting: Urology

## 2016-11-15 DIAGNOSIS — N281 Cyst of kidney, acquired: Secondary | ICD-10-CM | POA: Diagnosis not present

## 2016-11-15 DIAGNOSIS — N99523 Herniation of incontinent stoma of urinary tract: Secondary | ICD-10-CM | POA: Diagnosis not present

## 2016-11-16 DIAGNOSIS — Z8546 Personal history of malignant neoplasm of prostate: Secondary | ICD-10-CM | POA: Diagnosis not present

## 2016-11-19 ENCOUNTER — Ambulatory Visit (INDEPENDENT_AMBULATORY_CARE_PROVIDER_SITE_OTHER): Payer: PPO | Admitting: Urology

## 2016-11-19 DIAGNOSIS — N281 Cyst of kidney, acquired: Secondary | ICD-10-CM | POA: Diagnosis not present

## 2016-11-19 DIAGNOSIS — Z936 Other artificial openings of urinary tract status: Secondary | ICD-10-CM | POA: Diagnosis not present

## 2016-11-19 DIAGNOSIS — K432 Incisional hernia without obstruction or gangrene: Secondary | ICD-10-CM | POA: Diagnosis not present

## 2016-11-19 DIAGNOSIS — Z8546 Personal history of malignant neoplasm of prostate: Secondary | ICD-10-CM

## 2016-12-20 ENCOUNTER — Ambulatory Visit: Payer: Self-pay | Admitting: General Surgery

## 2016-12-20 DIAGNOSIS — K432 Incisional hernia without obstruction or gangrene: Secondary | ICD-10-CM | POA: Diagnosis not present

## 2016-12-20 NOTE — H&P (Signed)
History of Present Illness Ian Ruff MD; 08/24/5782 1:40 PM) The patient is a 83 year old male presenting for a post-operative visit. Patient status post pelvic exoneration for rectal cancer invading the prostate. He is doing well. He is back to normal activities. he comes in complaing of an enlarging hernia within his incision. It is non-tender and reduces when he lies flat.    Problem List/Past Medical Ian Ruff, MD; 69/06/2950 2:10 PM) HISTORY OF RECTAL CANCER (Z85.048) INCISIONAL HERNIA, WITHOUT OBSTRUCTION OR GANGRENE (K43.2)  Past Surgical History Ian Ruff, MD; 84/01/3242 2:10 PM) Appendectomy Colon Polyp Removal - Colonoscopy  Diagnostic Studies History Ian Ruff, MD; 01/0/2725 2:10 PM) Colonoscopy >10 years ago within last year  Allergies (April Staton, [place]; 12/20/2016 1:35 PM) No Known Drug Allergies 10/09/2014  Medication History (April Staton, CMA; 12/20/2016 1:36 PM) Metoprolol Succinate ER (25MG  Tablet ER 24HR, Oral) Active. Losartan Potassium (25MG  Tablet, Oral) Active. HydroCHLOROthiazide (12.5MG  Tablet, Oral) Active. Hydrochlorothiazide (25MG  Tablet, Oral) Active. Losartan Potassium (50MG  Tablet, Oral) Active. Metoprolol Succinate ER (50MG  Tablet ER 24HR, Oral) Active. Omeprazole (20MG  Capsule DR, Oral) Active. Niacin ER (Antihyperlipidemic) (500MG  Tablet ER, Oral) Active. Simvastatin (20MG  Tablet, Oral) Active. Medications Reconciled  Social History Ian Ruff, MD; 36/06/4401 2:10 PM) Alcohol use Occasional alcohol use. Caffeine use Coffee, Tea. No drug use Tobacco use Never smoker.  Family History Ian Ruff, MD; 47/04/2593 2:10 PM) Cancer Brother, Mother. Cerebrovascular Accident Brother. Colon Cancer Mother. Colon Polyps Daughter. Diabetes Mellitus Mother. Heart Disease Brother, Father. Heart disease in male family member before age 58 Melanoma Daughter. Ovarian Cancer Sister. Prostate Cancer  Brother.  Other Problems Ian Ruff, MD; 63/08/7562 2:10 PM) Other disease, cancer, significant illness Back Pain Gastroesophageal Reflux Disease Hemorrhoids High blood pressure Hypercholesterolemia Prostate Cancer Rectal Cancer     Review of Systems Ian Ruff MD; 33/02/9516 2:10 PM) General Not Present- Appetite Loss, Chills, Fatigue, Fever, Night Sweats, Weight Gain and Weight Loss. Skin Not Present- Change in Wart/Mole, Dryness, Hives, Jaundice, New Lesions, Non-Healing Wounds, Rash and Ulcer. HEENT Present- Wears glasses/contact lenses. Not Present- Earache, Hearing Loss, Hoarseness, Nose Bleed, Oral Ulcers, Ringing in the Ears, Seasonal Allergies, Sinus Pain, Sore Throat, Visual Disturbances and Yellow Eyes. Respiratory Not Present- Bloody sputum, Chronic Cough, Difficulty Breathing, Snoring and Wheezing. Breast Not Present- Breast Mass, Breast Pain, Nipple Discharge and Skin Changes. Cardiovascular Not Present- Chest Pain, Difficulty Breathing Lying Down, Leg Cramps, Palpitations, Rapid Heart Rate, Shortness of Breath and Swelling of Extremities. Gastrointestinal Present- Hemorrhoids. Not Present- Abdominal Pain, Bloating, Bloody Stool, Change in Bowel Habits, Chronic diarrhea, Constipation, Difficulty Swallowing, Excessive gas, Gets full quickly at meals, Indigestion, Nausea, Rectal Pain and Vomiting. Male Genitourinary Present- Frequency and Urgency. Not Present- Blood in Urine, Change in Urinary Stream, Impotence, Nocturia, Painful Urination and Urine Leakage. Musculoskeletal Present- Back Pain. Not Present- Joint Pain, Joint Stiffness, Muscle Pain, Muscle Weakness and Swelling of Extremities. Neurological Not Present- Decreased Memory, Fainting, Headaches, Numbness, Seizures, Tingling, Tremor, Trouble walking and Weakness. Psychiatric Present- Change in Sleep Pattern and Fearful. Not Present- Anxiety, Bipolar, Depression and Frequent crying. Endocrine Not Present-  Cold Intolerance, Excessive Hunger, Hair Changes, Heat Intolerance and New Diabetes. Hematology Not Present- Easy Bruising, Excessive bleeding, Gland problems, HIV and Persistent Infections.  Vitals (April Staton CMA; 12/20/2016 1:37 PM) 12/20/2016 1:36 PM Weight: 196.31 lb Height: 67in Body Surface Area: 2.01 m Body Mass Index: 30.75 kg/m  Temp.: 97.1F(Oral)  Pulse: 67 (Regular)  BP: 136/88 (Sitting, Left Arm, Standard)      Physical  Exam Ian Ruff MD; 95/0/9326 2:10 PM)  General Mental Status-Alert. General Appearance-Consistent with stated age. Hydration-Well hydrated. Voice-Normal.  Chest and Lung Exam Chest and lung exam reveals -quiet, even and easy respiratory effort with no use of accessory muscles. Inspection Chest Wall - Normal. Back - normal.  Cardiovascular Cardiovascular examination reveals -normal heart sounds, regular rate and rhythm with no murmurs.  Abdomen Inspection Skin - Scar - Note: ~7TI hernia at umbilicus, reducible Ostomies are pink and viable. There is clear urine in the urostomy and stool in the colostomy bag. Palpation/Percussion Palpation and Percussion of the abdomen reveal - Soft, Non Tender, No Rebound tenderness and No Rigidity (guarding).    Assessment & Plan Ian Ruff MD; 45/08/996 2:09 PM)  INCISIONAL HERNIA, WITHOUT OBSTRUCTION OR GANGRENE (K43.2) Impression: 83 year old male with incisional hernia. This is been enlarging over the past couple years. He is ready to have this fixed. We will plan on doing a primary repair with mesh underlay using biologic phasic smash to help prevent erosion into his ostomies.  We discussed that there is still a slightly higher risk of recurrence with using this type of mesh and that there is still a chance of erosion and mesh infection. I believe this is the best choice for him. We discussed the very high recurrence rate of just primary repair. Other risks include  bleeding, infection, damage to adjacent structures and prolong ileus.

## 2017-01-05 NOTE — Progress Notes (Signed)
CT Chest 06-21-16 epic

## 2017-01-05 NOTE — Patient Instructions (Signed)
Ian Christensen  01/05/2017   Your procedure is scheduled on: 01-20-16  Report to [facility] Main  Entrance Take [place]  elevators to 3rd floor to  [place] at    [place] AM.    Call this number if you have problems the morning of surgery [phone]    Remember: ONLY 1 PERSON MAY GO WITH YOU TO SHORT STAY TO GET  READY MORNING OF YOUR SURGERY.  Do not eat food or drink liquids :After Midnight.     Take these medicines the morning of surgery with A SIP OF WATER: metoprolol,  Simvastatin if you take it in the am                                You may not have any metal on your body including hair pins and              piercings  Do not wear jewelry,  lotions, powders or perfumes, deodorant                     Men may shave face and neck.   Do not bring valuables to the hospital. [place].  Contacts, dentures or bridgework may not be worn into surgery.  Leave suitcase in the car. After surgery it may be brought to your room.                  Please read over the following fact sheets you were given: ____________________________________________________________________          [facility] - Preparing for Surgery Before surgery, you can play an important role.  Because skin is not sterile, your skin needs to be as free of germs as possible.  You can reduce the number of germs on your skin by washing with CHG (chlorahexidine gluconate) soap before surgery.  CHG is an antiseptic cleaner which kills germs and bonds with the skin to continue killing germs even after washing. Please DO NOT use if you have an allergy to CHG or antibacterial soaps.  If your skin becomes reddened/irritated stop using the CHG and inform your nurse when you arrive at Short Stay. Do not shave (including legs and underarms) for at least 48 hours prior to the first CHG shower.  You may shave your face/neck. Please follow these  instructions carefully:  1.  Shower with CHG Soap the night before surgery and the  morning of Surgery.  2.  If you choose to wash your hair, wash your hair first as usual with your  normal  shampoo.  3.  After you shampoo, rinse your hair and body thoroughly to remove the  shampoo.                           4.  Use CHG as you would any other liquid soap.  You can apply chg directly  to the skin and wash                       Gently with a scrungie or clean washcloth.  5.  Apply the CHG Soap to your body ONLY FROM THE NECK DOWN.  Do not use on face/ open                           Wound or open sores. Avoid contact with eyes, ears mouth and genitals (private parts).                       Wash face,  Genitals (private parts) with your normal soap.             6.  Wash thoroughly, paying special attention to the area where your surgery  will be performed.  7.  Thoroughly rinse your body with warm water from the neck down.  8.  DO NOT shower/wash with your normal soap after using and rinsing off  the CHG Soap.                9.  Pat yourself dry with a clean towel.            10.  Wear clean pajamas.            11.  Place clean sheets on your bed the night of your first shower and do not  sleep with pets. Day of Surgery : Do not apply any lotions/deodorants the morning of surgery.  Please wear clean clothes to the hospital/surgery center.  FAILURE TO FOLLOW THESE INSTRUCTIONS MAY RESULT IN THE CANCELLATION OF YOUR SURGERY PATIENT SIGNATURE_________________________________  NURSE SIGNATURE__________________________________  ________________________________________________________________________

## 2017-01-12 ENCOUNTER — Encounter (HOSPITAL_COMMUNITY)
Admission: RE | Admit: 2017-01-12 | Discharge: 2017-01-12 | Disposition: A | Discharge status: Home / Self Care | Payer: PPO | Source: Ambulatory Visit | Attending: General Surgery | Admitting: General Surgery

## 2017-01-13 DIAGNOSIS — Z936 Other artificial openings of urinary tract status: Secondary | ICD-10-CM | POA: Diagnosis not present

## 2017-01-13 DIAGNOSIS — Z933 Colostomy status: Secondary | ICD-10-CM | POA: Diagnosis not present

## 2017-01-13 DIAGNOSIS — C20 Malignant neoplasm of rectum: Secondary | ICD-10-CM | POA: Diagnosis not present

## 2017-02-02 ENCOUNTER — Other Ambulatory Visit (HOSPITAL_COMMUNITY): Payer: Self-pay | Admitting: *Deleted

## 2017-02-02 DIAGNOSIS — C20 Malignant neoplasm of rectum: Secondary | ICD-10-CM

## 2017-02-03 ENCOUNTER — Inpatient Hospital Stay (HOSPITAL_COMMUNITY): Payer: PPO | Attending: Oncology

## 2017-02-03 ENCOUNTER — Ambulatory Visit (HOSPITAL_COMMUNITY)
Admission: RE | Admit: 2017-02-03 | Discharge: 2017-02-03 | Disposition: A | Discharge status: Home / Self Care | Payer: PPO | Source: Ambulatory Visit | Attending: Oncology | Admitting: Oncology

## 2017-02-03 DIAGNOSIS — C20 Malignant neoplasm of rectum: Secondary | ICD-10-CM | POA: Insufficient documentation

## 2017-02-03 DIAGNOSIS — K439 Ventral hernia without obstruction or gangrene: Secondary | ICD-10-CM | POA: Insufficient documentation

## 2017-02-03 DIAGNOSIS — C218 Malignant neoplasm of overlapping sites of rectum, anus and anal canal: Secondary | ICD-10-CM | POA: Diagnosis not present

## 2017-02-03 DIAGNOSIS — K8020 Calculus of gallbladder without cholecystitis without obstruction: Secondary | ICD-10-CM | POA: Insufficient documentation

## 2017-02-03 LAB — CBC WITH DIFFERENTIAL/PLATELET
BASOS ABS: 0.1 10*3/uL (ref 0.0–0.1)
Basophils Relative: 1 %
EOS PCT: 4 %
Eosinophils Absolute: 0.3 10*3/uL (ref 0.0–0.7)
HCT: 46.4 % (ref 39.0–52.0)
Hemoglobin: 15.2 g/dL (ref 13.0–17.0)
Lymphocytes Relative: 36 %
Lymphs Abs: 2.7 10*3/uL (ref 0.7–4.0)
MCH: 31.2 pg (ref 26.0–34.0)
MCHC: 32.8 g/dL (ref 30.0–36.0)
MCV: 95.3 fL (ref 78.0–100.0)
MONO ABS: 0.7 10*3/uL (ref 0.1–1.0)
MONOS PCT: 9 %
NEUTROS ABS: 3.8 10*3/uL (ref 1.7–7.7)
Neutrophils Relative %: 50 %
PLATELETS: 168 10*3/uL (ref 150–400)
RBC: 4.87 MIL/uL (ref 4.22–5.81)
RDW: 12.9 % (ref 11.5–15.5)
WBC: 7.5 10*3/uL (ref 4.0–10.5)

## 2017-02-03 LAB — COMPREHENSIVE METABOLIC PANEL
ALT: 16 U/L — ABNORMAL LOW (ref 17–63)
ANION GAP: 7 (ref 5–15)
AST: 18 U/L (ref 15–41)
Albumin: 4 g/dL (ref 3.5–5.0)
Alkaline Phosphatase: 56 U/L (ref 38–126)
BUN: 25 mg/dL — ABNORMAL HIGH (ref 6–20)
CO2: 26 mmol/L (ref 22–32)
Calcium: 9.4 mg/dL (ref 8.9–10.3)
Chloride: 104 mmol/L (ref 101–111)
Creatinine, Ser: 1.3 mg/dL — ABNORMAL HIGH (ref 0.61–1.24)
GFR, EST AFRICAN AMERICAN: 57 mL/min — AB (ref >60)
GFR, EST NON AFRICAN AMERICAN: 49 mL/min — AB (ref >60)
Glucose, Bld: 101 mg/dL — ABNORMAL HIGH (ref 65–99)
POTASSIUM: 4.1 mmol/L (ref 3.5–5.1)
Sodium: 137 mmol/L (ref 135–145)
TOTAL PROTEIN: 7.1 g/dL (ref 6.5–8.1)
Total Bilirubin: 0.6 mg/dL (ref 0.3–1.2)

## 2017-02-03 MED ORDER — IOPAMIDOL (ISOVUE-300) INJECTION 61%
100.0000 mL | Freq: Once | INTRAVENOUS | Status: AC | PRN
  Administered 2017-02-03: 100 mL via INTRAVENOUS

## 2017-02-04 LAB — CEA: CEA1: 1.5 ng/mL (ref 0.0–4.7)

## 2017-02-10 ENCOUNTER — Encounter (HOSPITAL_COMMUNITY): Payer: Self-pay | Admitting: Internal Medicine

## 2017-02-10 ENCOUNTER — Other Ambulatory Visit: Payer: Self-pay

## 2017-02-10 ENCOUNTER — Inpatient Hospital Stay (HOSPITAL_BASED_OUTPATIENT_CLINIC_OR_DEPARTMENT_OTHER): Payer: PPO | Admitting: Internal Medicine

## 2017-02-10 VITALS — BP 136/66 | HR 72 | Temp 97.8°F | Resp 16 | Ht 67.0 in | Wt 196.0 lb

## 2017-02-10 DIAGNOSIS — C20 Malignant neoplasm of rectum: Secondary | ICD-10-CM | POA: Diagnosis not present

## 2017-02-10 NOTE — Progress Notes (Signed)
Chief complaint: Mr. Hurta return today for follow up for Cancer  of rectum stage IIA.  Diagnosed in September 2016 and a H/o prostate cancer.  HPI: Ian Christensen 84 y.o. male returns for followup of Stage IIA (T3N0M0) adenocarcinoma of rectum, S/P resection by Dr. Leighton Ruff on 16/07/3708 demonstrating a 3.5 cm invasive adenocarcinoma invading through the muscularis propria, + LVI, and 0/11 lymph nodes.  Patient was not a neoadjuvant chemoXRT candidate due to previous XRT for prostate cancer.  He is S/P definitive resection.  Patient refused adjuvant chemotherapy with Xeloda.  Pre-operative CEA was WNL. AND H/O prostate cancer, S/P brachytherapy. October 04, 2016    Prostate cancer [facility])   12/11/2014 Procedure    Prostatectomy at time of rectal ca resection      12/17/2014 Pathology Results    PROSTATE: PROSTATIC ADENOCARCINOMA WITHOUT TREATMENT EFFECT, GLEASON SCORE 4+4=8 THE TUMOR LOCATED AT THE LEFT ANTERIOR APEX AND THE MID MARGIN OF RESECTION ARE POSITIVE AT THE LEFT ANTERIOR APEX (0.4 CM, GLEASON SCORE 4+4=8) OTHER MARGINS ARE NEGATIVE FOR TUMOR       Rectal cancer (Ian Christensen)   10/02/2014 Procedure    Colonoscopy by Dr. Carlean Purl- One-third circumferential medium mass was found in the distal rectum; ulcerated, firm anterior over prostate bed, multiple biopsies were performed using cold forceps- suspect carcinoma ? rectal vs recurrence of prostate      10/04/2014 Pathology Results    Rectum, biopsy ulcerated mass-invasive adenocarcinoma consistent with colorectal origin.  CDX 2 positive.  PSA negative.      10/07/2014 Initial Diagnosis    Rectal cancer (Ian Christensen)      10/08/2014 Imaging    CT CAP- 1. Wall thickening of the rectum compatible with recently diagnosed rectal carcinoma. Tiny subcentimeter perirectal lymph nodes. 2. Multiple bilateral pulmonary nodules as above. While these may be infectious or inflammatory in etiology, metastatic disease in the setting of known  rectal carcinoma is not excluded. 3. Nodular soft tissue within the left mainstem bronchus which is nonspecific and may represent mucus however endobronchial nodule is not excluded. Recommend either attention on followup or correlation with bronchoscopy if the patient is having respiratory symptoms. 4. No evidence for focal hepatic lesion. 5. Bilateral low-attenuation renal lesions with internal densities greater than that of fluid, indeterminate. Recommend attention on followup or definitive characterization with pre and post contrast-enhanced MRI.      10/10/2014 Procedure    EUS by Dr. Ardis Hughs- Non-circumferential, 3.4cm [phone] (Stage IIIB) rectal adenocarcinoma along the anterior wall of the distal rectum with distal edge 1-2cm from the anal verge.      12/11/2014 Procedure    Segmental resection for tumor by Dr. Leighton Ruff      54/62/7035 Pathology Results    Colon, segmental resection for tumor, Bladder and Prostate COLON: INVASIVE COLONIC ADENOCARCINOMA (3.5 CM IN GREATEST DIMENSION) THE CARCINOMA INVASIVE THROUGH THE MUSCULARIS PROPRIA AND PRESENTS AT THE ANTERIOR MESORECTUM RESECTION MARGIN (1.5 CM, R2) THE PROXIMAL AND DISTAL RESECTION MARGINS ARE NEGATIVE FOR TUMOR LYMPHOVASCULAR INVASION IS IDENTIFED ELEVEN BENIGN LYMPH NODES (0/11) PROSTATE: PROSTATIC ADENOCARCINOMA WITHOUT TREATMENT EFFECT, GLEASON SCORE 4+4=8 THE TUMOR LOCATED AT THE LEFT ANTERIOR APEX AND THE MID MARGIN OF RESECTION ARE POSITIVE AT THE LEFT ANTERIOR APEX (0.4 CM, GLEASON SCORE 4+4=8) OTHER MARGINS ARE NEGATIVE FOR TUMOR BLADDER: BENIGN UROTHELIUM AND SUBCUTANOUS TISSUE NEGATIVE FOR MALIGNANCY      06/20/2015 Imaging    CT CAP- 1. Interval cystoprostatectomy, urinary diversion, abdominal perineal resection and sigmoid colostomy. 2. No  evidence of abdominal pelvic metastatic disease. 3. Stable small pulmonary nodules bilaterally. Based on stability over nearly 9 months, these are likely  benign. Attention on continued follow-up recommended. 4. Stable low-density renal lesions bilaterally. 5. Moderate atherosclerosis.      12/31/2015 Procedure    Colonoscopy by Dr. Heron Sabins and colostomy in the sigmoid colon.  Diverticulosis in the left colon, examination otherwise normal.      06/21/2016 Imaging    CT CAP- Stable exam. No definite evidence of recurrent or metastatic carcinoma.  Stable sub-cm noncalcified bilateral pulmonary nodules, most consistent with benign postinflammatory etiology. Recommend continued attention on follow-up imaging.  Cholelithiasis. No radiographic evidence of cholecystitis.  Aortic and coronary artery atherosclerosis.       INTERVAL HISTORY: He is doing well, colostomy functioning well  He denies any blood in his stool.  He denies any changes in his stool.  He denies any constipation or diarrhea.  He denies any changes in color, frequency, and caliber of stool. He denies any pain.  Weight is stable. He remains active and continues to golf.  Review of Systems  Constitutional: Negative.  Negative for chills, fever and weight loss.  HENT: Negative.   Eyes: Negative.   Respiratory: Negative.  Negative for cough.   Cardiovascular: Negative.  Negative for chest pain.  Gastrointestinal: Negative.  Negative for blood in stool, constipation, diarrhea, melena, nausea and vomiting.  Genitourinary: Negative.   Musculoskeletal: Negative.   Skin: Negative.   Neurological: Negative.  Negative for weakness.  Endo/Heme/Allergies: Negative.   Psychiatric/Behavioral: Negative.     Past Medical History:  Diagnosis Date  . Arthritis   . Colostomy in place Ian Christensen)   . GERD (gastroesophageal reflux disease)   . History of colon polyps   . History of hiatal hernia   . 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 HTN (hypertension)     followed by pcp  . Hyperlipidemia   . Presence of urostomy (Ian Christensen)   . Rectal cancer ([place]) 10/07/2014   no chemo or radiation/ bladder was removed also  . Tinnitus    per pt intermittant   . Wears glasses     PHYSICAL EXAMINATION  ECOG PERFORMANCE STATUS: 0  Vitals:   02/10/17 1229  BP: 136/66  Pulse: 72  Resp: 16  Temp: 97.8 F (36.6 Ian)  SpO2: 97%    GENERAL:alert, no distress, well nourished, well developed, comfortable, cooperative, smiling and unaccompanied SKIN: skin color, texture, turgor are normal, no rashes or significant lesions HEAD: Normocephalic, No masses, lesions, tenderness or abnormalities OROPHARYNX:lips, buccal mucosa, and tongue normal and mucous membranes are moist  NECK: supple, no adenopathy, trachea midline LYMPH:  no palpable lymphadenopathy LUNGS: clear to auscultation  HEART: regular rate & rhythm ABDOMEN:abdomen soft and normal bowel sounds, colostomy site clean EXTREMITIES:,No edema NEURO: alert & oriented x 3 with fluent speech, no focal motor/sensory deficits, gait normal  PENDING LABS:  CEA  pending    ASSESSMENT AND PLAN:  Rectal Cancer stage IIA, NED today (path stage- pre-op EUS stage was Stage III B per chart)  CT abdomen pelvis with contrast February 03, 2017 has been reviewed no evidence of disease recurrence.  CT chest was not ordered last CT chest from June 21, 2016 shows bilateral subcentimeter lung nodules.    CBC CMP and CEA from February 03, 2017 have been reviewed stable elevated serum  creatinine at 1.3 otherwise everything appears normal . CEA is 1.5.  Patient does not remember when was the last colonoscopy.He is having the hernia surgery repair by his surgeon next week told him to discuss having a colonoscopy through the colostomy by the same surgeon at the same time since he is overdue for it.  He said he would.  Patient has a subtle pulmonary nodules which are stable on June of 2018 (CT scan) Needs a yearly  checkup  Repeat CT chest abdomen pelvis in 6 months follow the lung nodules as well as for recurrence of rectal cancer.    Prostate cancer status post prostatectomy follows with urology reportedly last visit was 2 months ago PSA was undetectable patient does not have any symptoms.    Return in 6 months after CT

## 2017-02-16 ENCOUNTER — Telehealth: Payer: Self-pay | Admitting: Internal Medicine

## 2017-02-16 NOTE — Telephone Encounter (Signed)
Patient's daughter reports that patient needs a repeat colonoscopy for his history of rectal cancer.  He is not having any current symptoms, but is scheduled for hernia repair on 03/02/17.  We rescheduled office visit to discuss colonoscopy to 3/25.  She will call back if anything changes and we need to reschedule.

## 2017-02-17 ENCOUNTER — Ambulatory Visit: Payer: PPO | Admitting: Internal Medicine

## 2017-02-23 NOTE — Patient Instructions (Addendum)
Ian Christensen  02/23/2017   Your procedure is scheduled on: 03/02/2017   Report to [facility] Main  Entrance  Report to admitting at   [place] AM   Call this number if you have problems the morning of surgery [phone]   Remember: Do not eat food or drink liquids :After Midnight.     Take these medicines the morning of surgery with A SIP OF WATER: metoprolol                                You may not have any metal on your body including hair pins and              piercings  Do not wear jewelry, , lotions, powders or perfumes, deodorant         .              Men may shave face and neck.   Do not bring valuables to the hospital. [place].  Contacts, dentures or bridgework may not be worn into surgery.  Leave suitcase in the car. After surgery it may be brought to your room.     Special instruction:  Bring ostomy supplies from home              Please read over the following fact sheets you were given: _____________________________________________________________________             [facility] - Preparing for Surgery Before surgery, you can play an important role.  Because skin is not sterile, your skin needs to be as free of germs as possible.  You can reduce the number of germs on your skin by washing with CHG (chlorahexidine gluconate) soap before surgery.  CHG is an antiseptic cleaner which kills germs and bonds with the skin to continue killing germs even after washing. Please DO NOT use if you have an allergy to CHG or antibacterial soaps.  If your skin becomes reddened/irritated stop using the CHG and inform your nurse when you arrive at Short Stay. Do not shave (including legs and underarms) for at least 48 hours prior to the first CHG shower.  You may shave your face/neck. Please follow these instructions carefully:  1.  Shower with CHG Soap the night before surgery and the  morning of  Surgery.  2.  If you choose to wash your hair, wash your hair first as usual with your  normal  shampoo.  3.  After you shampoo, rinse your hair and body thoroughly to remove the  shampoo.                           4.  Use CHG as you would any other liquid soap.  You can apply chg directly  to the skin and wash                       Gently with a scrungie or clean washcloth.  5.  Apply the CHG Soap to your body ONLY FROM THE NECK DOWN.   Do not use on face/ open  Wound or open sores. Avoid contact with eyes, ears mouth and genitals (private parts).                       Wash face,  Genitals (private parts) with your normal soap.             6.  Wash thoroughly, paying special attention to the area where your surgery  will be performed.  7.  Thoroughly rinse your body with warm water from the neck down.  8.  DO NOT shower/wash with your normal soap after using and rinsing off  the CHG Soap.                9.  Pat yourself dry with a clean towel.            10.  Wear clean pajamas.            11.  Place clean sheets on your bed the night of your first shower and do not  sleep with pets. Day of Surgery : Do not apply any lotions/deodorants the morning of surgery.  Please wear clean clothes to the hospital/surgery center.  FAILURE TO FOLLOW THESE INSTRUCTIONS MAY RESULT IN THE CANCELLATION OF YOUR SURGERY PATIENT SIGNATURE_________________________________  NURSE SIGNATURE__________________________________  ________________________________________________________________________

## 2017-02-24 ENCOUNTER — Encounter (HOSPITAL_COMMUNITY): Payer: Self-pay

## 2017-02-24 ENCOUNTER — Encounter (HOSPITAL_COMMUNITY)
Admission: RE | Admit: 2017-02-24 | Discharge: 2017-02-24 | Disposition: A | Discharge status: Home / Self Care | Payer: PPO | Source: Ambulatory Visit | Attending: General Surgery | Admitting: General Surgery

## 2017-02-24 ENCOUNTER — Encounter (HOSPITAL_COMMUNITY): Payer: Self-pay | Admitting: *Deleted

## 2017-02-24 ENCOUNTER — Other Ambulatory Visit: Payer: Self-pay

## 2017-02-24 DIAGNOSIS — K432 Incisional hernia without obstruction or gangrene: Secondary | ICD-10-CM | POA: Diagnosis not present

## 2017-02-24 DIAGNOSIS — Z01810 Encounter for preprocedural cardiovascular examination: Secondary | ICD-10-CM | POA: Insufficient documentation

## 2017-02-24 DIAGNOSIS — Z01812 Encounter for preprocedural laboratory examination: Secondary | ICD-10-CM | POA: Insufficient documentation

## 2017-02-24 HISTORY — DX: Personal history of colon polyps, unspecified: Z86.0100

## 2017-02-24 HISTORY — DX: Other artificial openings of urinary tract status: Z93.6

## 2017-02-24 HISTORY — DX: Hyperlipidemia, unspecified: E78.5

## 2017-02-24 HISTORY — DX: Personal history of malignant neoplasm of prostate: Z85.46

## 2017-02-24 HISTORY — DX: Personal history of colonic polyps: Z86.010

## 2017-02-24 LAB — BASIC METABOLIC PANEL
Anion gap: 5 (ref 5–15)
BUN: 23 mg/dL — AB (ref 6–20)
CALCIUM: 9.3 mg/dL (ref 8.9–10.3)
CHLORIDE: 108 mmol/L (ref 101–111)
CO2: 25 mmol/L (ref 22–32)
CREATININE: 1.2 mg/dL (ref 0.61–1.24)
GFR calc Af Amer: >60 mL/min (ref >60)
GFR calc non Af Amer: 54 mL/min — ABNORMAL LOW (ref >60)
Glucose, Bld: 95 mg/dL (ref 65–99)
Potassium: 4.3 mmol/L (ref 3.5–5.1)
SODIUM: 138 mmol/L (ref 135–145)

## 2017-02-24 LAB — CBC
HCT: 43.6 % (ref 39.0–52.0)
Hemoglobin: 14.7 g/dL (ref 13.0–17.0)
MCH: 31.2 pg (ref 26.0–34.0)
MCHC: 33.7 g/dL (ref 30.0–36.0)
MCV: 92.6 fL (ref 78.0–100.0)
Platelets: 183 10*3/uL (ref 150–400)
RBC: 4.71 MIL/uL (ref 4.22–5.81)
RDW: 13.1 % (ref 11.5–15.5)
WBC: 9 10*3/uL (ref 4.0–10.5)

## 2017-02-25 NOTE — Progress Notes (Signed)
Final EKG done 02/24/17-epic

## 2017-03-01 ENCOUNTER — Encounter (HOSPITAL_COMMUNITY): Payer: Self-pay | Admitting: Anesthesiology

## 2017-03-01 MED ORDER — BUPIVACAINE LIPOSOME 1.3 % IJ SUSP
20.0000 mL | INTRAMUSCULAR | Status: DC
  Filled 2017-03-01: qty 20

## 2017-03-01 NOTE — Anesthesia Preprocedure Evaluation (Addendum)
Anesthesia Evaluation  Patient identified by MRN, date of birth, ID band Patient awake    Reviewed: Allergy & Precautions, NPO status , Patient's Chart, lab work & pertinent test results, reviewed documented beta blocker date and time   Airway Mallampati: II  TM Distance: >3 FB Neck ROM: Full    Dental  (+) Caps, Missing, Poor Dentition   Pulmonary  Multiple pulmonary nodules    Pulmonary exam normal breath sounds clear to auscultation       Cardiovascular hypertension, Pt. on medications and Pt. on home beta blockers Normal cardiovascular exam Rhythm:Regular Rate:Normal     Neuro/Psych negative neurological ROS  negative psych ROS   GI/Hepatic Neg liver ROS, hiatal hernia, GERD  Medicated and Controlled,Hx/o rectal Ca   Endo/Other  Hyperlipidemia Obesity   Renal/GU negative Renal ROS   Urostomy     Musculoskeletal  (+) Arthritis , Osteoarthritis,  Incisional hernia    Abdominal (+) + obese,   Peds  Hematology   Anesthesia Other Findings   Reproductive/Obstetrics                            Anesthesia Physical Anesthesia Plan  ASA: III  Anesthesia Plan: General   Post-op Pain Management:    Induction: Intravenous  PONV Risk Score and Plan: 4 or greater and Midazolam, Dexamethasone, Ondansetron and Treatment may vary due to age or medical condition  Airway Management Planned: Oral ETT  Additional Equipment:   Intra-op Plan:   Post-operative Plan: Extubation in OR  Informed Consent: I have reviewed the patients History and Physical, chart, labs and discussed the procedure including the risks, benefits and alternatives for the proposed anesthesia with the patient or authorized representative who has indicated his/her understanding and acceptance.   Dental advisory given  Plan Discussed with: CRNA, Anesthesiologist and Surgeon  Anesthesia Plan Comments:         Anesthesia Quick Evaluation

## 2017-03-02 ENCOUNTER — Observation Stay (HOSPITAL_COMMUNITY)
Admission: RE | Admit: 2017-03-02 | Discharge: 2017-03-03 | Disposition: A | Discharge status: Home / Self Care | Payer: PPO | Source: Ambulatory Visit | Attending: General Surgery | Admitting: General Surgery

## 2017-03-02 ENCOUNTER — Other Ambulatory Visit: Payer: Self-pay

## 2017-03-02 ENCOUNTER — Ambulatory Visit (HOSPITAL_COMMUNITY): Payer: PPO | Admitting: Anesthesiology

## 2017-03-02 ENCOUNTER — Encounter (HOSPITAL_COMMUNITY): Payer: Self-pay | Admitting: Anesthesiology

## 2017-03-02 ENCOUNTER — Encounter (HOSPITAL_COMMUNITY)
Admission: RE | Disposition: A | Discharge status: Home / Self Care | Payer: Self-pay | Source: Ambulatory Visit | Attending: General Surgery

## 2017-03-02 DIAGNOSIS — C218 Malignant neoplasm of overlapping sites of rectum, anus and anal canal: Secondary | ICD-10-CM | POA: Diagnosis not present

## 2017-03-02 DIAGNOSIS — L539 Erythematous condition, unspecified: Secondary | ICD-10-CM | POA: Diagnosis not present

## 2017-03-02 DIAGNOSIS — Z85048 Personal history of other malignant neoplasm of rectum, rectosigmoid junction, and anus: Secondary | ICD-10-CM | POA: Diagnosis not present

## 2017-03-02 DIAGNOSIS — Z79899 Other long term (current) drug therapy: Secondary | ICD-10-CM | POA: Insufficient documentation

## 2017-03-02 DIAGNOSIS — E669 Obesity, unspecified: Secondary | ICD-10-CM | POA: Diagnosis present

## 2017-03-02 DIAGNOSIS — Y838 Other surgical procedures as the cause of abnormal reaction of the patient, or of later complication, without mention of misadventure at the time of the procedure: Secondary | ICD-10-CM | POA: Diagnosis not present

## 2017-03-02 DIAGNOSIS — E785 Hyperlipidemia, unspecified: Secondary | ICD-10-CM | POA: Insufficient documentation

## 2017-03-02 DIAGNOSIS — E7800 Pure hypercholesterolemia, unspecified: Secondary | ICD-10-CM | POA: Diagnosis present

## 2017-03-02 DIAGNOSIS — N2889 Other specified disorders of kidney and ureter: Secondary | ICD-10-CM | POA: Diagnosis not present

## 2017-03-02 DIAGNOSIS — K219 Gastro-esophageal reflux disease without esophagitis: Secondary | ICD-10-CM

## 2017-03-02 DIAGNOSIS — Z9049 Acquired absence of other specified parts of digestive tract: Secondary | ICD-10-CM | POA: Insufficient documentation

## 2017-03-02 DIAGNOSIS — R918 Other nonspecific abnormal finding of lung field: Secondary | ICD-10-CM | POA: Diagnosis present

## 2017-03-02 DIAGNOSIS — R112 Nausea with vomiting, unspecified: Secondary | ICD-10-CM | POA: Diagnosis not present

## 2017-03-02 DIAGNOSIS — K432 Incisional hernia without obstruction or gangrene: Secondary | ICD-10-CM | POA: Diagnosis present

## 2017-03-02 DIAGNOSIS — Z6831 Body mass index (BMI) 31.0-31.9, adult: Secondary | ICD-10-CM

## 2017-03-02 DIAGNOSIS — Z6830 Body mass index (BMI) 30.0-30.9, adult: Secondary | ICD-10-CM | POA: Diagnosis not present

## 2017-03-02 DIAGNOSIS — T8132XA Disruption of internal operation (surgical) wound, not elsewhere classified, initial encounter: Secondary | ICD-10-CM | POA: Diagnosis not present

## 2017-03-02 DIAGNOSIS — R1110 Vomiting, unspecified: Secondary | ICD-10-CM | POA: Diagnosis not present

## 2017-03-02 DIAGNOSIS — C61 Malignant neoplasm of prostate: Secondary | ICD-10-CM | POA: Diagnosis not present

## 2017-03-02 DIAGNOSIS — Z23 Encounter for immunization: Secondary | ICD-10-CM | POA: Diagnosis present

## 2017-03-02 DIAGNOSIS — K567 Ileus, unspecified: Secondary | ICD-10-CM | POA: Diagnosis not present

## 2017-03-02 DIAGNOSIS — I10 Essential (primary) hypertension: Secondary | ICD-10-CM

## 2017-03-02 DIAGNOSIS — K56699 Other intestinal obstruction unspecified as to partial versus complete obstruction: Secondary | ICD-10-CM | POA: Diagnosis not present

## 2017-03-02 DIAGNOSIS — Z8589 Personal history of malignant neoplasm of other organs and systems: Secondary | ICD-10-CM | POA: Insufficient documentation

## 2017-03-02 DIAGNOSIS — K56609 Unspecified intestinal obstruction, unspecified as to partial versus complete obstruction: Secondary | ICD-10-CM | POA: Diagnosis present

## 2017-03-02 DIAGNOSIS — Z933 Colostomy status: Secondary | ICD-10-CM | POA: Diagnosis not present

## 2017-03-02 DIAGNOSIS — Z8546 Personal history of malignant neoplasm of prostate: Secondary | ICD-10-CM | POA: Diagnosis not present

## 2017-03-02 DIAGNOSIS — K635 Polyp of colon: Secondary | ICD-10-CM | POA: Diagnosis not present

## 2017-03-02 DIAGNOSIS — Z4682 Encounter for fitting and adjustment of non-vascular catheter: Secondary | ICD-10-CM | POA: Diagnosis not present

## 2017-03-02 DIAGNOSIS — C20 Malignant neoplasm of rectum: Secondary | ICD-10-CM | POA: Diagnosis not present

## 2017-03-02 HISTORY — PX: INSERTION OF MESH: SHX5868

## 2017-03-02 HISTORY — PX: INCISIONAL HERNIA REPAIR: SHX193

## 2017-03-02 HISTORY — DX: Presence of spectacles and contact lenses: Z97.3

## 2017-03-02 SURGERY — REPAIR, HERNIA, INCISIONAL
Anesthesia: General | Site: Abdomen

## 2017-03-02 MED ORDER — TRAMADOL HCL 50 MG PO TABS
50.0000 mg | ORAL_TABLET | Freq: Four times a day (QID) | ORAL | Status: DC | PRN
  Administered 2017-03-02: 50 mg via ORAL
  Filled 2017-03-02: qty 1

## 2017-03-02 MED ORDER — SIMVASTATIN 20 MG PO TABS
20.0000 mg | ORAL_TABLET | Freq: Every day | ORAL | Status: DC
  Administered 2017-03-02: 20 mg via ORAL
  Filled 2017-03-02: qty 1

## 2017-03-02 MED ORDER — ALUM & MAG HYDROXIDE-SIMETH 200-200-20 MG/5ML PO SUSP: 30.0000 mL | Freq: Four times a day (QID) | ORAL | Status: DC | PRN

## 2017-03-02 MED ORDER — LIDOCAINE HCL (CARDIAC) 20 MG/ML IV SOLN
INTRAVENOUS | Status: DC | PRN
  Administered 2017-03-02: 80 mg via INTRAVENOUS

## 2017-03-02 MED ORDER — LACTATED RINGERS IV SOLN
INTRAVENOUS | Status: DC
  Administered 2017-03-02: 08:00:00 via INTRAVENOUS

## 2017-03-02 MED ORDER — ASPIRIN EC 81 MG PO TBEC
162.0000 mg | DELAYED_RELEASE_TABLET | Freq: Every day | ORAL | Status: DC
  Administered 2017-03-02: 162 mg via ORAL
  Filled 2017-03-02: qty 2

## 2017-03-02 MED ORDER — LOSARTAN POTASSIUM 25 MG PO TABS
25.0000 mg | ORAL_TABLET | Freq: Every day | ORAL | Status: DC
  Administered 2017-03-02 – 2017-03-03 (×2): 25 mg via ORAL
  Filled 2017-03-02 (×2): qty 1

## 2017-03-02 MED ORDER — CEFAZOLIN SODIUM-DEXTROSE 2-4 GM/100ML-% IV SOLN
2.0000 g | INTRAVENOUS | Status: AC
  Administered 2017-03-02: 2 g via INTRAVENOUS
  Filled 2017-03-02: qty 100

## 2017-03-02 MED ORDER — PANTOPRAZOLE SODIUM 40 MG PO TBEC
40.0000 mg | DELAYED_RELEASE_TABLET | Freq: Every day | ORAL | Status: DC
  Administered 2017-03-02: 40 mg via ORAL
  Filled 2017-03-02: qty 1

## 2017-03-02 MED ORDER — DEXAMETHASONE SODIUM PHOSPHATE 10 MG/ML IJ SOLN
INTRAMUSCULAR | Status: DC | PRN
  Administered 2017-03-02: 10 mg via INTRAVENOUS

## 2017-03-02 MED ORDER — BUPIVACAINE LIPOSOME 1.3 % IJ SUSP
INTRAMUSCULAR | Status: DC | PRN
  Administered 2017-03-02: 20 mL

## 2017-03-02 MED ORDER — DIPHENHYDRAMINE HCL 12.5 MG/5ML PO ELIX: 12.5000 mg | ORAL_SOLUTION | Freq: Four times a day (QID) | ORAL | Status: DC | PRN

## 2017-03-02 MED ORDER — ONDANSETRON HCL 4 MG/2ML IJ SOLN
INTRAMUSCULAR | Status: DC | PRN
  Administered 2017-03-02: 4 mg via INTRAVENOUS

## 2017-03-02 MED ORDER — PROPOFOL 10 MG/ML IV BOLUS
INTRAVENOUS | Status: DC | PRN
  Administered 2017-03-02: 150 mg via INTRAVENOUS
  Administered 2017-03-02: 30 mg via INTRAVENOUS

## 2017-03-02 MED ORDER — ONDANSETRON HCL 4 MG/2ML IJ SOLN: 4.0000 mg | Freq: Four times a day (QID) | INTRAMUSCULAR | Status: DC | PRN

## 2017-03-02 MED ORDER — GABAPENTIN 300 MG PO CAPS
300.0000 mg | ORAL_CAPSULE | ORAL | Status: AC
  Administered 2017-03-02: 300 mg via ORAL
  Filled 2017-03-02: qty 1

## 2017-03-02 MED ORDER — NIACIN ER (ANTIHYPERLIPIDEMIC) 500 MG PO TBCR
500.0000 mg | EXTENDED_RELEASE_TABLET | Freq: Every day | ORAL | Status: DC
  Administered 2017-03-02: 500 mg via ORAL
  Filled 2017-03-02: qty 1

## 2017-03-02 MED ORDER — KCL IN DEXTROSE-NACL 20-5-0.45 MEQ/L-%-% IV SOLN
INTRAVENOUS | Status: DC
  Administered 2017-03-02: 15:00:00 via INTRAVENOUS
  Filled 2017-03-02 (×2): qty 1000

## 2017-03-02 MED ORDER — ENOXAPARIN SODIUM 40 MG/0.4ML ~~LOC~~ SOLN
40.0000 mg | SUBCUTANEOUS | Status: DC
  Administered 2017-03-03: 40 mg via SUBCUTANEOUS
  Filled 2017-03-02: qty 0.4

## 2017-03-02 MED ORDER — ACETAMINOPHEN 500 MG PO TABS
1000.0000 mg | ORAL_TABLET | Freq: Three times a day (TID) | ORAL | Status: DC | PRN
  Administered 2017-03-02 – 2017-03-03 (×3): 1000 mg via ORAL
  Filled 2017-03-02 (×3): qty 2

## 2017-03-02 MED ORDER — GABAPENTIN 300 MG PO CAPS
300.0000 mg | ORAL_CAPSULE | Freq: Two times a day (BID) | ORAL | Status: DC
  Administered 2017-03-02 – 2017-03-03 (×2): 300 mg via ORAL
  Filled 2017-03-02 (×2): qty 1

## 2017-03-02 MED ORDER — ACETAMINOPHEN 500 MG PO TABS: 1000.0000 mg | ORAL_TABLET | Freq: Three times a day (TID) | ORAL | Status: DC | PRN

## 2017-03-02 MED ORDER — HYDROMORPHONE HCL 1 MG/ML IJ SOLN: 0.2500 mg | INTRAMUSCULAR | Status: DC | PRN

## 2017-03-02 MED ORDER — 0.9 % SODIUM CHLORIDE (POUR BTL) OPTIME
TOPICAL | Status: DC | PRN
  Administered 2017-03-02: 1000 mL

## 2017-03-02 MED ORDER — ROCURONIUM BROMIDE 100 MG/10ML IV SOLN
INTRAVENOUS | Status: DC | PRN
  Administered 2017-03-02: 50 mg via INTRAVENOUS
  Administered 2017-03-02: 10 mg via INTRAVENOUS
  Administered 2017-03-02 (×2): 5 mg via INTRAVENOUS

## 2017-03-02 MED ORDER — ONDANSETRON HCL 4 MG PO TABS: 4.0000 mg | ORAL_TABLET | Freq: Four times a day (QID) | ORAL | Status: DC | PRN

## 2017-03-02 MED ORDER — ACETAMINOPHEN 500 MG PO TABS
1000.0000 mg | ORAL_TABLET | ORAL | Status: AC
  Administered 2017-03-02: 1000 mg via ORAL
  Filled 2017-03-02: qty 2

## 2017-03-02 MED ORDER — METOPROLOL SUCCINATE ER 25 MG PO TB24
25.0000 mg | ORAL_TABLET | Freq: Two times a day (BID) | ORAL | Status: DC
  Administered 2017-03-02 – 2017-03-03 (×2): 25 mg via ORAL
  Filled 2017-03-02 (×2): qty 1

## 2017-03-02 MED ORDER — BUPIVACAINE-EPINEPHRINE 0.25% -1:200000 IJ SOLN
INTRAMUSCULAR | Status: DC | PRN
  Administered 2017-03-02: 30 mL

## 2017-03-02 MED ORDER — HYDROMORPHONE HCL 1 MG/ML IJ SOLN
0.5000 mg | INTRAMUSCULAR | Status: DC | PRN
  Administered 2017-03-02: 0.5 mg via INTRAVENOUS
  Filled 2017-03-02: qty 0.5

## 2017-03-02 MED ORDER — EPHEDRINE SULFATE 50 MG/ML IJ SOLN
INTRAMUSCULAR | Status: DC | PRN
  Administered 2017-03-02 (×4): 5 mg via INTRAVENOUS

## 2017-03-02 MED ORDER — FENTANYL CITRATE (PF) 100 MCG/2ML IJ SOLN
INTRAMUSCULAR | Status: DC | PRN
  Administered 2017-03-02: 150 ug via INTRAVENOUS

## 2017-03-02 MED ORDER — DIPHENHYDRAMINE HCL 50 MG/ML IJ SOLN: 12.5000 mg | Freq: Four times a day (QID) | INTRAMUSCULAR | Status: DC | PRN

## 2017-03-02 MED ORDER — SUGAMMADEX SODIUM 200 MG/2ML IV SOLN
INTRAVENOUS | Status: DC | PRN
  Administered 2017-03-02: 200 mg via INTRAVENOUS

## 2017-03-02 SURGICAL SUPPLY — 36 items
CHLORAPREP W/TINT 26ML (MISCELLANEOUS) ×4 IMPLANT
DRAIN CHANNEL 19F RND (DRAIN) ×4 IMPLANT
DRAPE INCISE IOBAN 66X45 STRL (DRAPES) ×4 IMPLANT
DRAPE LAPAROSCOPIC ABDOMINAL (DRAPES) ×4 IMPLANT
DRAPE UTILITY XL STRL (DRAPES) ×4 IMPLANT
DRSG OPSITE POSTOP 4X8 (GAUZE/BANDAGES/DRESSINGS) ×4 IMPLANT
ELECT REM PT RETURN 15FT ADLT (MISCELLANEOUS) ×4 IMPLANT
EVACUATOR SILICONE 100CC (DRAIN) ×4 IMPLANT
GLOVE BIO SURGEON STRL SZ 6.5 (GLOVE) ×3 IMPLANT
GLOVE BIO SURGEONS STRL SZ 6.5 (GLOVE) ×1
GLOVE BIOGEL PI IND STRL 7.0 (GLOVE) ×2 IMPLANT
GLOVE BIOGEL PI INDICATOR 7.0 (GLOVE) ×2
GOWN STRL REUS W/TWL 2XL LVL3 (GOWN DISPOSABLE) ×4 IMPLANT
GOWN STRL REUS W/TWL XL LVL3 (GOWN DISPOSABLE) ×8 IMPLANT
KIT BASIN OR (CUSTOM PROCEDURE TRAY) ×4 IMPLANT
NEEDLE HYPO 22GX1.5 SAFETY (NEEDLE) ×4 IMPLANT
NEEDLE SPNL 22GX3.5 QUINCKE BK (NEEDLE) ×4 IMPLANT
PACK GENERAL/GYN (CUSTOM PROCEDURE TRAY) ×4 IMPLANT
POUCH DRAINABLE 1PC 2 1/4 FLAT (OSTOMY) ×4 IMPLANT
POUCH UROSTOMY 11/2 CONVEX (OSTOMY) ×4 IMPLANT
SPONGE DRAIN TRACH 4X4 STRL 2S (GAUZE/BANDAGES/DRESSINGS) ×4 IMPLANT
STAPLER VISISTAT 35W (STAPLE) IMPLANT
SUT ETHILON 2 0 PS N (SUTURE) ×4 IMPLANT
SUT NOVA NAB DX-16 0-1 5-0 T12 (SUTURE) ×12 IMPLANT
SUT SILK 2 0 SH (SUTURE) ×4 IMPLANT
SUT VIC AB 2-0 CT1 27 (SUTURE) ×2
SUT VIC AB 2-0 CT1 TAPERPNT 27 (SUTURE) ×2 IMPLANT
SUT VIC AB 2-0 SH 18 (SUTURE) IMPLANT
SUT VIC AB 4-0 PS2 18 (SUTURE) ×4 IMPLANT
SUT VICRYL 0 UR6 27IN ABS (SUTURE) ×4 IMPLANT
SYR 20CC LL (SYRINGE) ×4 IMPLANT
TAPE CLOTH SURG 4X10 WHT LF (GAUZE/BANDAGES/DRESSINGS) ×4 IMPLANT
TISSUE MATRIX STRATTICE 16X20 (Mesh General) ×4 IMPLANT
TOWEL OR 17X26 10 PK STRL BLUE (TOWEL DISPOSABLE) ×8 IMPLANT
TOWEL OR NON WOVEN STRL DISP B (DISPOSABLE) ×4 IMPLANT
TRAY FOLEY CATH 16FRSI W/METER (SET/KITS/TRAYS/PACK) ×4 IMPLANT

## 2017-03-02 NOTE — Anesthesia Postprocedure Evaluation (Signed)
Anesthesia Post Note  Patient: DAX MURGUIA  Procedure(s) Performed: INCISIONAL HERNIA REPAIR WITH MESH (N/A Abdomen) INSERTION OF MESH (Abdomen)     Patient location during evaluation: PACU Anesthesia Type: General Level of consciousness: awake and alert and oriented Pain management: pain level controlled Vital Signs Assessment: post-procedure vital signs reviewed and stable Respiratory status: spontaneous breathing, nonlabored ventilation, respiratory function stable and patient connected to nasal cannula oxygen Cardiovascular status: blood pressure returned to baseline and stable Postop Assessment: no apparent nausea or vomiting Anesthetic complications: no    Last Vitals:  Vitals:   03/02/17 1045 03/02/17 1100  BP: (!) 142/68 (!) 146/67  Pulse: 69 70  Resp: 13 16  Temp:    SpO2: 100% 98%    Last Pain:  Vitals:   03/02/17 1100  TempSrc:   PainSc: 4                  Jennel Mara A.

## 2017-03-02 NOTE — H&P (Signed)
The patient is a 84 year old male presenting for a post-operative visit. Patient status post pelvic exoneration for rectal cancer invading the prostate. He is doing well. He is back to normal activities. he comes in complaing of an enlarging hernia within his incision. It is non-tender and reduces when he lies flat.    Problem List/Past Medical Leighton Ruff, MD; 29/05/1882 2:10 PM) HISTORY OF RECTAL CANCER (Z85.048) INCISIONAL HERNIA, WITHOUT OBSTRUCTION OR GANGRENE (K43.2)  Past Surgical History Leighton Ruff, MD; 16/06/628 2:10 PM) Appendectomy Colon Polyp Removal - Colonoscopy  Diagnostic Studies History Leighton Ruff, MD; 16/0/1093 2:10 PM) Colonoscopy >10 years ago within last year  Allergies (April Staton, [place]; 12/20/2016 1:35 PM) No Known Drug Allergies 10/09/2014  Medication History (April Staton, CMA; 12/20/2016 1:36 PM) Metoprolol Succinate ER (25MG  Tablet ER 24HR, Oral) Active. Losartan Potassium (25MG  Tablet, Oral) Active. HydroCHLOROthiazide (12.5MG  Tablet, Oral) Active. Hydrochlorothiazide (25MG  Tablet, Oral) Active. Losartan Potassium (50MG  Tablet, Oral) Active. Metoprolol Succinate ER (50MG  Tablet ER 24HR, Oral) Active. Omeprazole (20MG  Capsule DR, Oral) Active. Niacin ER (Antihyperlipidemic) (500MG  Tablet ER, Oral) Active. Simvastatin (20MG  Tablet, Oral) Active. Medications Reconciled  Social History Leighton Ruff, MD; 23/05/5730 2:10 PM) Alcohol use Occasional alcohol use. Caffeine use Coffee, Tea. No drug use Tobacco use Never smoker.  Family History Leighton Ruff, MD; 20/02/5425 2:10 PM) Cancer Brother, Mother. Cerebrovascular Accident Brother. Colon Cancer Mother. Colon Polyps Daughter. Diabetes Mellitus Mother. Heart Disease Brother, Father. Heart disease in male family member before age 52 Melanoma Daughter. Ovarian Cancer Sister. Prostate Cancer Brother.  Other Problems Leighton Ruff, MD; 06/19/3760  2:10 PM) Other disease, cancer, significant illness Back Pain Gastroesophageal Reflux Disease Hemorrhoids High blood pressure Hypercholesterolemia Prostate Cancer Rectal Cancer     Review of Systems General Not Present- Appetite Loss, Chills, Fatigue, Fever, Night Sweats, Weight Gain and Weight Loss. Skin Not Present- Change in Wart/Mole, Dryness, Hives, Jaundice, New Lesions, Non-Healing Wounds, Rash and Ulcer. HEENT Present- Wears glasses/contact lenses. Not Present- Earache, Hearing Loss, Hoarseness, Nose Bleed, Oral Ulcers, Ringing in the Ears, Seasonal Allergies, Sinus Pain, Sore Throat, Visual Disturbances and Yellow Eyes. Respiratory Not Present- Bloody sputum, Chronic Cough, Difficulty Breathing, Snoring and Wheezing. Breast Not Present- Breast Mass, Breast Pain, Nipple Discharge and Skin Changes. Cardiovascular Not Present- Chest Pain, Difficulty Breathing Lying Down, Leg Cramps, Palpitations, Rapid Heart Rate, Shortness of Breath and Swelling of Extremities. Gastrointestinal Present- Hemorrhoids. Not Present- Abdominal Pain, Bloating, Bloody Stool, Change in Bowel Habits, Chronic diarrhea, Constipation, Difficulty Swallowing, Excessive gas, Gets full quickly at meals, Indigestion, Nausea, Rectal Pain and Vomiting. Male Genitourinary Present- Frequency and Urgency. Not Present- Blood in Urine, Change in Urinary Stream, Impotence, Nocturia, Painful Urination and Urine Leakage. Musculoskeletal Present- Back Pain. Not Present- Joint Pain, Joint Stiffness, Muscle Pain, Muscle Weakness and Swelling of Extremities. Neurological Not Present- Decreased Memory, Fainting, Headaches, Numbness, Seizures, Tingling, Tremor, Trouble walking and Weakness. Psychiatric Present- Change in Sleep Pattern and Fearful. Not Present- Anxiety, Bipolar, Depression and Frequent crying. Endocrine Not Present- Cold Intolerance, Excessive Hunger, Hair Changes, Heat Intolerance and New  Diabetes. Hematology Not Present- Easy Bruising, Excessive bleeding, Gland problems, HIV and Persistent Infections.  BP (!) 176/81   Pulse 67   Temp 98 F (36.7 C) (Oral)   Resp 18   Ht 5\' 7"  (1.702 m)   Wt 87.8 kg (193 lb 8 oz)   SpO2 98%   BMI 30.31 kg/m     Physical Exam  General Mental Status-Alert. General Appearance-Consistent with stated age. Hydration-Well hydrated.  Voice-Normal.  Chest and Lung Exam Chest and lung exam reveals -quiet, even and easy respiratory effort with no use of accessory muscles. Inspection Chest Wall - Normal. Back - normal.  Cardiovascular Cardiovascular examination reveals -normal heart sounds, regular rate and rhythm with no murmurs.  Abdomen Inspection Skin - Scar - Note: ~6EG hernia at umbilicus, reducible Ostomies are pink and viable. There is clear urine in the urostomy and stool in the colostomy bag. Palpation/Percussion Palpation and Percussion of the abdomen reveal - Soft, Non Tender, No Rebound tenderness and No Rigidity (guarding).    Assessment & Plan INCISIONAL HERNIA, WITHOUT OBSTRUCTION OR GANGRENE (K43.2) Impression: 84 year old male with incisional hernia. This is been enlarging over the past couple years. He is ready to have this fixed. We will plan on doing a primary repair with mesh underlay using biologic mesh to help prevent erosion into his ostomies while still giving him some additional reinforcement.    We discussed that there is still a slightly higher risk of recurrence with using this type of mesh.  We also discussed that we may not be able to fix his parastomal hernia without risk of damage  I believe this is the best choice for him. We discussed the very high recurrence rate of just primary repair. We discussed that we may not be able to safely repair his parastomal hernia without risk of damaging his ureter.  Other risks include bleeding, infection, damage to adjacent structures  (including his ureters) and prolonged ileus.

## 2017-03-02 NOTE — Transfer of Care (Signed)
Immediate Anesthesia Transfer of Care Note  Patient: Ian Christensen  Procedure(s) Performed: INCISIONAL HERNIA REPAIR WITH MESH (N/A Abdomen) INSERTION OF MESH (Abdomen)  Patient Location: PACU  Anesthesia Type:General  Level of Consciousness: awake, alert  and oriented  Airway & Oxygen Therapy: Patient Spontanous Breathing and Patient connected to face mask oxygen  Post-op Assessment: Report given to RN and Post -op Vital signs reviewed and stable  Post vital signs: Reviewed and stable  Last Vitals:  Vitals:   03/02/17 0709  BP: (!) 176/81  Pulse: 67  Resp: 18  Temp: 36.7 C  SpO2: 98%    Last Pain:  Vitals:   03/02/17 0709  TempSrc: Oral  PainSc: 0-No pain         Complications: No apparent anesthesia complications

## 2017-03-02 NOTE — Discharge Instructions (Addendum)
GENERAL SURGERY: POST OP INSTRUCTIONS  1. DIET: Follow a light bland diet the first 24 hours after arrival home, such as soup, liquids, crackers, etc.  Be sure to include lots of fluids daily.  Avoid fast food or heavy meals as your are more likely to get nauseated.   2. Take your usually prescribed home medications unless otherwise directed. 3. PAIN CONTROL: a. Pain is best controlled by a usual combination of three different methods TOGETHER: i. Ice/Heat ii. Over the counter pain medication iii. Prescription pain medication b. Most patients will experience some swelling and bruising around the incisions.  Ice packs or heating pads (30-60 minutes up to 6 times a day) will help. Use ice for the first few days to help decrease swelling and bruising, then switch to heat to help relax tight/sore spots and speed recovery.  Some people prefer to use ice alone, heat alone, alternating between ice & heat.  Experiment to what works for you.  Swelling and bruising can take several weeks to resolve.   c. It is helpful to take an over-the-counter pain medication regularly for the first few weeks.  Choose one of the following that works best for you: i. Naproxen (Aleve, etc)  Two 220mg  tabs twice a day ii. Ibuprofen (Advil, etc) Three 200mg  tabs four times a day (every meal & bedtime) d. A  prescription for pain medication (such as Percocet, oxycodone, hydrocodone, etc) should be given to you upon discharge.  Take your pain medication as prescribed.  i. If you are having problems/concerns with the prescription medicine (does not control pain, nausea, vomiting, rash, itching, etc), please call us [phone] to see if we need to switch you to a different pain medicine that will work better for you and/or control your side effect better. ii. If you need a refill on your pain medication, please contact your pharmacy.  They will contact our office to request authorization. Prescriptions will not be filled after 5  pm or on week-ends. 4. Avoid getting constipated.  Between the surgery and the pain medications, it is common to experience some constipation.  Increasing fluid intake and taking a fiber supplement (such as Metamucil, Citrucel, FiberCon, MiraLax, etc) 1-2 times a day regularly will usually help prevent this problem from occurring.  A mild laxative (prune juice, Milk of Magnesia, MiraLax, etc) should be taken according to package directions if there are no bowel movements after 48 hours.   5. Wash / shower every day.  You may shower over the dressings as they are waterproof.  Continue to shower over incision(s) after the dressing is off. 6. Remove your waterproof bandage 2 days after surgery.  You may leave the incision open to air at this point.  You may replace a dressing/Band-Aid to cover the incision for comfort if you wish.  JP drain: empty daily and record output.  We will remove this in the office once daily outputs are less than 30 ml  7. ACTIVITIES as tolerated:   a. You may resume regular (light) daily activities beginning the next day--such as daily self-care, walking, climbing stairs--gradually increasing activities as tolerated.  If you can walk 30 minutes without difficulty, it is safe to try more intense activity such as jogging, treadmill, bicycling, low-impact aerobics, swimming, etc. b. Save the most intensive and strenuous activity for last such as sit-ups, heavy lifting, contact sports, etc  Refrain from any heavy lifting or straining until you are off narcotics for pain control.   c.  DO NOT PUSH THROUGH PAIN.  Let pain be your guide: If it hurts to do something, don't do it.  Pain is your body warning you to avoid that activity for another week until the pain goes down. d. You may drive when you are no longer taking prescription pain medication, you can comfortably wear a seatbelt, and you can safely maneuver your car and apply brakes. e. Dennis Bast may have sexual intercourse when it is  comfortable.  8. FOLLOW UP in our office a. Please call [facility] at [phone] to set up an appointment to see your surgeon in the office for a follow-up appointment approximately 2-3 weeks after your surgery. b. Make sure that you call for this appointment the day you arrive home to insure a convenient appointment time. 9. IF YOU HAVE DISABILITY OR FAMILY LEAVE FORMS, BRING THEM TO THE OFFICE FOR PROCESSING.  DO NOT GIVE THEM TO YOUR DOCTOR.   WHEN TO CALL us [phone]: 1. Poor pain control 2. Reactions / problems with new medications (rash/itching, nausea, etc)  3. Fever over 101.5 F (38.5 C) 4. Worsening swelling or bruising 5. Continued bleeding from incision. 6. Increased pain, redness, or drainage from the incision   The clinic staff is available to answer your questions during regular business hours (8:30am-5pm).  Please dont hesitate to call and ask to speak to one of our nurses for clinical concerns.   If you have a medical emergency, go to the nearest emergency room or call 911.  A surgeon from [facility] is always on call at the [facility], [place], [place], [place], [place]  [address] ? MAIN: [phone] ? TOLL FREE: [phone] ?  FAX [phone] [email or web address]

## 2017-03-02 NOTE — Op Note (Signed)
03/02/2017  10:08 AM  PATIENT:  Ian Christensen  84 y.o. male  Patient Care Team: Redmond School, MD as PCP - General (Internal Medicine)  PRE-OPERATIVE DIAGNOSIS:  INCISIONAL HERNIA  POST-OPERATIVE DIAGNOSIS:  INCISIONAL HERNIA  PROCEDURE:   INCISIONAL HERNIA REPAIR WITH RETRO-RECTUS STRATTICE MESH INSERTION    Surgeon(s): Leighton Ruff, MD Ileana Roup, MD  ASSISTANT: Dr Dema Severin   ANESTHESIA:   local and general  EBL: 20 ml  Total I/O In: -  Out: 100 [Urine:80; Blood:20]  DRAINS: (40F) Jackson-Pratt drain(s) with closed bulb suction in the retrorectus space   SPECIMEN:  Source of Specimen:  hernia sac  DISPOSITION OF SPECIMEN:  PATHOLOGY  COUNTS:  YES  PLAN OF CARE: Admit for overnight observation  PATIENT DISPOSITION:  PACU - hemodynamically stable.  INDICATION: 84 y.o. M s/p robotic pelvic exenteration with incisional hernia.   OR FINDINGS: 8x5 cm incisional hernia  DESCRIPTION: the patient was identified in the preoperative holding area and taken to the OR where they were laid supine on the operating room table.  General anesthesia was induced without difficulty. SCDs were also noted to be in place prior to the initiation of anesthesia.  The ostomies were closed with a 2-0 silk suture.  The patient was then prepped and draped in the usual sterile fashion.   A surgical timeout was performed indicating the correct patient, procedure, positioning and need for preoperative antibiotics.   I began by making a periumbilical incision using a 10 blade scalpel.  This was carried down through the subcutaneous tissue to the level of the fascia.  The hernia sac was then entered bluntly.  I divided the remainder of the midline fascia above and below the hernia.  The small bowel adhesions were cleared away using mostly blunt dissection.  I evaluated the ileal conduit as well as the colostomy.  There was no sign of parastomal hernias.  I measured the hernia with a sterile  ruler this was noted to be 8 cm x 5 cm wide.  Given that the hernia was between 2 ostomies, we decided to place a biologic mesh in a retrorectus fashion to avoid contamination.  We separated out the posterior fascial edge from the anterior fascial edge and rectus muscle.  The posterior fascia and peritoneum were closed using a running 0 Vicryl suture.  I then brought out a 16 x 20 cm piece of Stratus mesh and trimmed this to size with a 2-1/2 cm overlap around the hernia.  This was then placed on top of the posterior fascia and secured into the space with 4 Novafil sutures.  A 19 [place] Blake drain was placed into the retrorectus space.  This was secured with a 2-0 nylon in the right lower quadrant.  The anterior fascia was then closed over top of this with interrupted #1 Novafil sutures.  The hernia sac was removed and sent to pathology for further examination.  I placed a field block with Exparel mixed with Marcaine.  The subcutaneous tissue was then closed using interrupted 2-0 Vicryl sutures.  The skin was closed with a running 4-0 Vicryl suture.  A sterile dressing was applied over this.  The patient was then awakened from anesthesia and sent to the postanesthesia care unit in stable condition.  All counts were correct per operating room staff.

## 2017-03-02 NOTE — Anesthesia Procedure Notes (Signed)
Procedure Name: Intubation Date/Time: 03/02/2017 8:36 AM Performed by: Glory Buff, CRNA Pre-anesthesia Checklist: Patient identified, Emergency Drugs available, Suction available and Patient being monitored Patient Re-evaluated:Patient Re-evaluated prior to induction Oxygen Delivery Method: Circle system utilized Preoxygenation: Pre-oxygenation with 100% oxygen Induction Type: IV induction Ventilation: Mask ventilation without difficulty Laryngoscope Size: Miller and 3 Grade View: Grade II Tube type: Oral Tube size: 7.5 mm Number of attempts: 1 Airway Equipment and Method: Oral airway and Bougie stylet Placement Confirmation: ETT inserted through vocal cords under direct vision,  positive ETCO2 and breath sounds checked- equal and bilateral Secured at: 22 cm Tube secured with: Tape Dental Injury: Teeth and Oropharynx as per pre-operative assessment  Difficulty Due To: Difficult Airway- due to anterior larynx and Difficult Airway- due to reduced neck mobility

## 2017-03-03 ENCOUNTER — Other Ambulatory Visit: Payer: Self-pay

## 2017-03-03 ENCOUNTER — Encounter (HOSPITAL_COMMUNITY): Payer: Self-pay | Admitting: General Surgery

## 2017-03-03 MED ORDER — TRAMADOL HCL 50 MG PO TABS: 50.0000 mg | ORAL_TABLET | Freq: Four times a day (QID) | ORAL | 0 refills | Status: DC | PRN

## 2017-03-03 NOTE — Discharge Summary (Signed)
Physician Discharge Summary  Patient ID: Ian Christensen MRN: [id number] DOB/AGE: [birth year] 84 y.o.  Admit date: 03/02/2017 Discharge date: 03/03/2017  Admission Diagnoses: Incisional hernia  Discharge Diagnoses:  Active Problems:   Incisional hernia of anterior abdominal wall without obstruction or gangrene   Discharged Condition: good  Hospital Course: Pt admitted for overnight observation.  By POD 1 he was having min pain and some bowel function, tolerating a diet with no nausea.  Consults: None  Significant Diagnostic Studies:  none  Treatments: IV hydration, antibiotics: Ancef, analgesia: acetaminophen and surgery: hernia repair  Discharge Exam: Blood pressure 126/62, pulse 77, temperature 97.6 F (36.4 C), temperature source Oral, resp. rate 18, height 5\' 7"  (1.702 m), weight 91.1 kg (200 lb 13.4 oz), SpO2 97 %. General appearance: alert and cooperative GI: normal findings: soft, non-distended Incision/Wound: clean, dry, intact  Disposition: 03-Skilled Nursing Facility   Allergies as of 03/03/2017      Reactions   Morphine And Related    Caused hallucinations.   Oxycodone    Caused hallucinations      Medication List    TAKE these medications   acetaminophen 500 MG tablet Commonly known as:  TYLENOL Take 1,000 mg by mouth every 8 (eight) hours as needed for mild pain or headache.   aspirin EC 81 MG tablet Take 162 mg by mouth at bedtime. Takes 30 minutes prior to Niacin   cyanocobalamin 500 MCG tablet Take 500 mcg by mouth daily.   Fish Oil 1200 MG Caps Take 1,200 mg by mouth daily.   losartan 25 MG tablet Commonly known as:  COZAAR Take 25 mg by mouth daily.   metoprolol succinate 25 MG 24 hr tablet Commonly known as:  TOPROL-XL Take 25 mg by mouth 2 (two) times daily. Takes succinate 2 times daily   niacin 500 MG CR tablet Commonly known as:  NIASPAN   omeprazole 20 MG capsule Commonly known as:  PRILOSEC Take 20 mg by mouth every  evening.   simvastatin 20 MG tablet Commonly known as:  ZOCOR Take 20 mg by mouth.   traMADol 50 MG tablet Commonly known as:  ULTRAM Take 1-2 tablets (50-100 mg total) by mouth every 6 (six) hours as needed (mild pain not relieved by acetaminophen).            Durable Medical Equipment  (From admission, onward)        Start     Ordered   03/03/17 0843  For home use only DME Walker rolling  Once    Question:  Patient needs a walker to treat with the following condition  Answer:  Weakness generalized   03/03/17 6433     Follow-up Information    Leighton Ruff, MD. Schedule an appointment as soon as possible for a visit in 2 week(s).   Specialty:  General Surgery Contact information: [address] [place] [address] [phone]           Signed: Rosario Adie 06/19/930, 3:55 AM

## 2017-03-03 NOTE — Progress Notes (Signed)
Pt alert, oriented, ambulating, and tolerating diet.  Changed his bag for him and changed jp dressing. He as also educated on how to empty and charge the drain.  D/C instructions and prescription was given.  All questions were answered.

## 2017-03-03 NOTE — Care Management Obs Status (Signed)
[facility] NOTIFICATION   Patient Details  Name: Ian Christensen MRN: [id number] Date of Birth: [birth year]   Medicare Observation Status Notification Given:  Yes    Guadalupe Maple, RN 03/03/2017, 12:00 PM

## 2017-03-03 NOTE — Progress Notes (Signed)
    Durable Medical Equipment  (From admission, onward)        Start     Ordered   03/03/17 0843  For home use only DME Walker rolling  Once    Question:  Patient needs a walker to treat with the following condition  Answer:  Weakness generalized   03/03/17 0843

## 2017-03-05 ENCOUNTER — Emergency Department (HOSPITAL_COMMUNITY): Payer: PPO

## 2017-03-05 ENCOUNTER — Other Ambulatory Visit: Payer: Self-pay

## 2017-03-05 ENCOUNTER — Encounter (HOSPITAL_COMMUNITY): Payer: Self-pay | Admitting: *Deleted

## 2017-03-05 ENCOUNTER — Inpatient Hospital Stay (HOSPITAL_COMMUNITY)
Admission: EM | Admit: 2017-03-05 | Discharge: 2017-03-13 | DRG: 354 | Disposition: A | Discharge status: Home Health | Payer: PPO | Attending: General Surgery | Admitting: General Surgery

## 2017-03-05 DIAGNOSIS — T8132XA Disruption of internal operation (surgical) wound, not elsewhere classified, initial encounter: Secondary | ICD-10-CM | POA: Diagnosis not present

## 2017-03-05 DIAGNOSIS — K56609 Unspecified intestinal obstruction, unspecified as to partial versus complete obstruction: Secondary | ICD-10-CM | POA: Diagnosis present

## 2017-03-05 DIAGNOSIS — Z23 Encounter for immunization: Secondary | ICD-10-CM | POA: Diagnosis present

## 2017-03-05 DIAGNOSIS — K432 Incisional hernia without obstruction or gangrene: Principal | ICD-10-CM | POA: Diagnosis present

## 2017-03-05 DIAGNOSIS — I10 Essential (primary) hypertension: Secondary | ICD-10-CM | POA: Diagnosis present

## 2017-03-05 DIAGNOSIS — R918 Other nonspecific abnormal finding of lung field: Secondary | ICD-10-CM | POA: Diagnosis present

## 2017-03-05 DIAGNOSIS — Y838 Other surgical procedures as the cause of abnormal reaction of the patient, or of later complication, without mention of misadventure at the time of the procedure: Secondary | ICD-10-CM | POA: Diagnosis not present

## 2017-03-05 DIAGNOSIS — Z85048 Personal history of other malignant neoplasm of rectum, rectosigmoid junction, and anus: Secondary | ICD-10-CM

## 2017-03-05 DIAGNOSIS — E7800 Pure hypercholesterolemia, unspecified: Secondary | ICD-10-CM | POA: Diagnosis present

## 2017-03-05 DIAGNOSIS — Z6830 Body mass index (BMI) 30.0-30.9, adult: Secondary | ICD-10-CM

## 2017-03-05 DIAGNOSIS — E669 Obesity, unspecified: Secondary | ICD-10-CM | POA: Diagnosis present

## 2017-03-05 DIAGNOSIS — K567 Ileus, unspecified: Secondary | ICD-10-CM | POA: Diagnosis not present

## 2017-03-05 DIAGNOSIS — K219 Gastro-esophageal reflux disease without esophagitis: Secondary | ICD-10-CM | POA: Diagnosis present

## 2017-03-05 DIAGNOSIS — Z4659 Encounter for fitting and adjustment of other gastrointestinal appliance and device: Secondary | ICD-10-CM

## 2017-03-05 DIAGNOSIS — Z8546 Personal history of malignant neoplasm of prostate: Secondary | ICD-10-CM | POA: Diagnosis not present

## 2017-03-05 DIAGNOSIS — Z933 Colostomy status: Secondary | ICD-10-CM | POA: Diagnosis not present

## 2017-03-05 LAB — URINALYSIS, ROUTINE W REFLEX MICROSCOPIC
Bilirubin Urine: NEGATIVE
GLUCOSE, UA: NEGATIVE mg/dL
Hgb urine dipstick: NEGATIVE
Ketones, ur: 5 mg/dL — AB
Nitrite: NEGATIVE
Protein, ur: 30 mg/dL — AB
Specific Gravity, Urine: 1.017 (ref 1.005–1.030)
Squamous Epithelial / LPF: NONE SEEN
pH: 7 (ref 5.0–8.0)

## 2017-03-05 LAB — CBC
HEMATOCRIT: 41.8 % (ref 39.0–52.0)
Hemoglobin: 14.5 g/dL (ref 13.0–17.0)
MCH: 32.6 pg (ref 26.0–34.0)
MCHC: 34.7 g/dL (ref 30.0–36.0)
MCV: 93.9 fL (ref 78.0–100.0)
PLATELETS: 185 10*3/uL (ref 150–400)
RBC: 4.45 MIL/uL (ref 4.22–5.81)
RDW: 13.6 % (ref 11.5–15.5)
WBC: 13.6 10*3/uL — AB (ref 4.0–10.5)

## 2017-03-05 LAB — COMPREHENSIVE METABOLIC PANEL
ALBUMIN: 3.3 g/dL — AB (ref 3.5–5.0)
ALT: 35 U/L (ref 17–63)
AST: 36 U/L (ref 15–41)
Alkaline Phosphatase: 74 U/L (ref 38–126)
Anion gap: 9 (ref 5–15)
BILIRUBIN TOTAL: 1.1 mg/dL (ref 0.3–1.2)
BUN: 29 mg/dL — AB (ref 6–20)
CO2: 24 mmol/L (ref 22–32)
CREATININE: 1.12 mg/dL (ref 0.61–1.24)
Calcium: 8.9 mg/dL (ref 8.9–10.3)
Chloride: 103 mmol/L (ref 101–111)
GFR calc Af Amer: >60 mL/min (ref >60)
GFR calc non Af Amer: 59 mL/min — ABNORMAL LOW (ref >60)
GLUCOSE: 105 mg/dL — AB (ref 65–99)
POTASSIUM: 3.9 mmol/L (ref 3.5–5.1)
Sodium: 136 mmol/L (ref 135–145)
TOTAL PROTEIN: 6.7 g/dL (ref 6.5–8.1)

## 2017-03-05 LAB — LIPASE, BLOOD: Lipase: 26 U/L (ref 11–51)

## 2017-03-05 MED ORDER — METOPROLOL SUCCINATE ER 25 MG PO TB24
25.0000 mg | ORAL_TABLET | Freq: Two times a day (BID) | ORAL | Status: DC
  Administered 2017-03-06 – 2017-03-13 (×14): 25 mg via ORAL
  Filled 2017-03-05 (×15): qty 1

## 2017-03-05 MED ORDER — SODIUM CHLORIDE 0.9 % IV SOLN
Freq: Once | INTRAVENOUS | Status: AC
  Administered 2017-03-05: 16:00:00 via INTRAVENOUS

## 2017-03-05 MED ORDER — HEPARIN SODIUM (PORCINE) 5000 UNIT/ML IJ SOLN
5000.0000 [IU] | Freq: Three times a day (TID) | INTRAMUSCULAR | Status: DC
  Administered 2017-03-05 – 2017-03-13 (×21): 5000 [IU] via SUBCUTANEOUS
  Filled 2017-03-05 (×18): qty 1

## 2017-03-05 MED ORDER — ONDANSETRON 4 MG PO TBDP: 4.0000 mg | ORAL_TABLET | Freq: Four times a day (QID) | ORAL | Status: DC | PRN

## 2017-03-05 MED ORDER — IOPAMIDOL (ISOVUE-300) INJECTION 61%
INTRAVENOUS | Status: AC
  Administered 2017-03-05: 100 mL
  Filled 2017-03-05: qty 100

## 2017-03-05 MED ORDER — CHLORHEXIDINE GLUCONATE CLOTH 2 % EX PADS
6.0000 | MEDICATED_PAD | Freq: Once | CUTANEOUS | Status: AC
Start: 2017-03-06 — End: 2017-03-06
  Administered 2017-03-06: 6 via TOPICAL

## 2017-03-05 MED ORDER — SIMVASTATIN 20 MG PO TABS
20.0000 mg | ORAL_TABLET | Freq: Every evening | ORAL | Status: DC
  Administered 2017-03-06 – 2017-03-12 (×6): 20 mg via ORAL
  Filled 2017-03-05 (×5): qty 1

## 2017-03-05 MED ORDER — METOPROLOL TARTRATE 5 MG/5ML IV SOLN
5.0000 mg | Freq: Four times a day (QID) | INTRAVENOUS | Status: DC | PRN
  Filled 2017-03-05: qty 5

## 2017-03-05 MED ORDER — CHLORHEXIDINE GLUCONATE CLOTH 2 % EX PADS: 6.0000 | MEDICATED_PAD | Freq: Once | CUTANEOUS | Status: DC

## 2017-03-05 MED ORDER — KETOROLAC TROMETHAMINE 15 MG/ML IJ SOLN
15.0000 mg | Freq: Four times a day (QID) | INTRAMUSCULAR | Status: DC | PRN
Start: 2017-03-05 — End: 2017-03-06

## 2017-03-05 MED ORDER — LACTATED RINGERS IV SOLN
INTRAVENOUS | Status: DC
  Administered 2017-03-05 – 2017-03-06 (×3): via INTRAVENOUS

## 2017-03-05 MED ORDER — PANTOPRAZOLE SODIUM 40 MG PO TBEC
40.0000 mg | DELAYED_RELEASE_TABLET | Freq: Every day | ORAL | Status: DC
  Administered 2017-03-06 – 2017-03-13 (×8): 40 mg via ORAL
  Filled 2017-03-05 (×8): qty 1

## 2017-03-05 MED ORDER — LOSARTAN POTASSIUM 25 MG PO TABS
25.0000 mg | ORAL_TABLET | Freq: Every day | ORAL | Status: DC
  Administered 2017-03-06 – 2017-03-13 (×8): 25 mg via ORAL
  Filled 2017-03-05 (×9): qty 1

## 2017-03-05 MED ORDER — SODIUM CHLORIDE 0.9 % IV SOLN
2.0000 g | INTRAVENOUS | Status: DC
  Filled 2017-03-05: qty 20

## 2017-03-05 MED ORDER — CHLORHEXIDINE GLUCONATE CLOTH 2 % EX PADS
6.0000 | MEDICATED_PAD | Freq: Once | CUTANEOUS | Status: AC
  Administered 2017-03-05: 6 via TOPICAL

## 2017-03-05 MED ORDER — METRONIDAZOLE IN NACL 5-0.79 MG/ML-% IV SOLN
500.0000 mg | Freq: Three times a day (TID) | INTRAVENOUS | Status: DC
  Filled 2017-03-05 (×3): qty 100

## 2017-03-05 MED ORDER — NIACIN ER (ANTIHYPERLIPIDEMIC) 500 MG PO TBCR
500.0000 mg | EXTENDED_RELEASE_TABLET | Freq: Every evening | ORAL | Status: DC
  Administered 2017-03-06 – 2017-03-12 (×5): 500 mg via ORAL
  Filled 2017-03-05 (×10): qty 1

## 2017-03-05 MED ORDER — ONDANSETRON HCL 4 MG/2ML IJ SOLN
4.0000 mg | Freq: Four times a day (QID) | INTRAMUSCULAR | Status: DC | PRN
  Administered 2017-03-09: 4 mg via INTRAVENOUS
  Filled 2017-03-05: qty 2

## 2017-03-05 NOTE — Anesthesia Preprocedure Evaluation (Signed)
Anesthesia Evaluation  Patient identified by MRN, date of birth, ID band Patient awake    Reviewed: Allergy & Precautions, NPO status , Patient's Chart, lab work & pertinent test results, reviewed documented beta blocker date and time   Airway Mallampati: II  TM Distance: >3 FB Neck ROM: Full    Dental  (+) Caps, Missing, Poor Dentition   Pulmonary  Multiple pulmonary nodules    Pulmonary exam normal breath sounds clear to auscultation       Cardiovascular hypertension, Pt. on medications and Pt. on home beta blockers Normal cardiovascular exam Rhythm:Regular Rate:Normal     Neuro/Psych negative neurological ROS  negative psych ROS   GI/Hepatic Neg liver ROS, hiatal hernia, GERD  Medicated and Controlled,Hx/o rectal Ca   Endo/Other  negative endocrine ROSHyperlipidemia Obesity   Renal/GU negative Renal ROS   Urostomy  negative genitourinary   Musculoskeletal  (+) Arthritis , Osteoarthritis,  Incisional hernia    Abdominal (+) + obese,   Peds negative pediatric ROS (+)  Hematology negative hematology ROS (+)   Anesthesia Other Findings   Reproductive/Obstetrics                             Anesthesia Physical  Anesthesia Plan  ASA: III  Anesthesia Plan: General   Post-op Pain Management:    Induction: Intravenous  PONV Risk Score and Plan: 4 or greater and Midazolam, Dexamethasone, Ondansetron and Treatment may vary due to age or medical condition  Airway Management Planned: Oral ETT  Additional Equipment:   Intra-op Plan:   Post-operative Plan: Extubation in OR  Informed Consent: I have reviewed the patients History and Physical, chart, labs and discussed the procedure including the risks, benefits and alternatives for the proposed anesthesia with the patient or authorized representative who has indicated his/her understanding and acceptance.   Dental advisory  given  Plan Discussed with: CRNA, Anesthesiologist and Surgeon  Anesthesia Plan Comments:         Anesthesia Quick Evaluation

## 2017-03-05 NOTE — Progress Notes (Signed)
H&P Patient well known to our service. He has a history of robotic pelvic exenteration with ileal conduit and end colostomy 12/11/14 for rectal cancer. He ultimately recovered well from this. He subsequently developed an incisional hernia in the midline between his two stomas. He then underwent exlap/retrorectus ventral hernia repair 03/02/17 with placement of biologic mesh. He was discharged on POD#1.  Subjective He returns to the ED with n/v that began the evening of his arrival home. He has been able to tolerate liquids without a whole lot of trouble but his intake has been less. He has been unable to tolerate solid food. He denies any abdominal pain. He has not had stool from his stoma but has had some gas today. He denies fever/chills  Objective: Vital signs in last 24 hours: Temp:  [98.3 F (36.8 C)] 98.3 F (36.8 C) (02/16 1333) Pulse Rate:  [66-92] 73 (02/16 1850) Resp:  [18-20] 20 (02/16 1850) BP: (131-157)/(64-79) 143/77 (02/16 1850) SpO2:  [93 %-99 %] 97 % (02/16 1850) Weight:  [87.5 kg (193 lb)] 87.5 kg (193 lb) (02/16 1333)    Intake/Output from previous day: No intake/output data recorded. Intake/Output this shift: Total I/O In: 1000 [I.V.:1000] Out: -   Gen: NAD, comfortable CV: RRR Pulm: Normal work of breathing Abd: Soft, NT/ND; +hernia midline at repair; there is reducible bowel in this defect on deep palpation. Some ecchymoses to umbilicus but skin is intact and there is no erythema. Ascitic fluid in JP drain. Ext: SCDs in place  Lab Results: CBC  Recent Labs    03/05/17 1524  WBC 13.6*  HGB 14.5  HCT 41.8  PLT 185   BMET Recent Labs    03/05/17 1524  NA 136  K 3.9  CL 103  CO2 24  GLUCOSE 105*  BUN 29*  CREATININE 1.12  CALCIUM 8.9    Assessment/Plan: Patient Active Problem List   Diagnosis Date Noted  . SBO (small bowel obstruction) ([place]) 03/05/2017  . Incisional hernia of anterior abdominal wall without obstruction or gangrene 03/02/2017   . Pulmonary nodules/lesions, multiple 10/17/2014  . Bilateral renal masses 10/17/2014  . Rectal cancer ([place]) 10/07/2014  . Hypertension 11/17/2011  . High cholesterol 11/17/2011  . Prostate cancer ([place]) 11/17/2011  . Colon polyps 11/17/2011   PLAN -Admit to surgery service -NPO, IVF, NG tube to low intermittent wall suction -Will plan return to OR tomorrow for exploration/repair   LOS: 0 days   Sharon Mt. Dema Severin, M.D. General and Colorectal Surgery [facility]

## 2017-03-05 NOTE — ED Provider Notes (Signed)
[facility] DEPT Provider Note   CSN: [id number] Arrival date & time: 03/05/17  1318     History   Chief Complaint Chief Complaint  Patient presents with  . Emesis    HPI Ian Christensen is a 84 y.o. Ian.  HPI Ian Christensen is a 84 y.o. Ian with hx of prostate can with colostomy and urostomy in place, HTN, had abdominal incisional hernia reparired on 03/02/17 by Dr. Marcello Moores, presents to ED for nausea, vomiting, no output in colostomy. Pt states that since going home after surgery, he has not had any output in his colostomy.  He is passing gas however.  He also states he has not been able to keep any solids down.  He is able to keep liquids down.  He reports some sore throat and burping and belching as well.  He denies severe abdominal pain, states his postoperative pain is managed with Tylenol.  He reports good urostomy output.  He states anytime he tries to eat any solids, he gets nauseated ends up throwing up few minutes after.  Denies any fever or chills.  Denies any back pain.  Denies any other complaints.  Past Medical History:  Diagnosis Date  . Arthritis   . Colostomy in place [facility])   . GERD (gastroesophageal reflux disease)   . History of colon polyps   . History of hiatal hernia   . 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 HTN (hypertension)    followed by pcp  . Hyperlipidemia   . Presence of urostomy ([place])   . Rectal cancer ([place]) 10/07/2014   no chemo or radiation/ bladder was removed also  . Tinnitus    per pt intermittant   . Wears glasses     Patient Active Problem List   Diagnosis Date Noted  . Incisional hernia of anterior abdominal wall without obstruction or gangrene 03/02/2017  . Pulmonary nodules/lesions, multiple 10/17/2014  . Bilateral renal masses 10/17/2014  . Rectal cancer ([place]) 10/07/2014  . Hypertension  11/17/2011  . High cholesterol 11/17/2011  . Prostate cancer ([place]) 11/17/2011  . Colon polyps 11/17/2011    Past Surgical History:  Procedure Laterality Date  . APPENDECTOMY  1955  . COLONOSCOPY    . CYSTO N/A 12/11/2014   Procedure:  CYSTO,  PROSTATECTOMY, CYSTECTOMY colon conduit, BILATERAL LYMPH NODE DISSECTION;  Surgeon: Alexis Frock, MD;  Location: [facility];  Service: Urology;  Laterality: N/A;  . EUS N/A 10/10/2014   Procedure: LOWER ENDOSCOPIC ULTRASOUND (EUS);  Surgeon: Milus Banister, MD;  Location: [facility];  Service: Endoscopy;  Laterality: N/A;  . HERNIA REPAIR    . INCISIONAL HERNIA REPAIR N/A 03/02/2017   Procedure: INCISIONAL HERNIA REPAIR WITH MESH;  Surgeon: Leighton Ruff, MD;  Location: [facility];  Service: General;  Laterality: N/A;  . INSERTION OF MESH  03/02/2017   Procedure: INSERTION OF MESH;  Surgeon: Leighton Ruff, MD;  Location: [facility];  Service: General;;  . RADIOACTIVE PROSTATE SEED IMPLANTS  02-06-1999  dr Jeffie Pollock  [facility]  . XI ROBOTIC ASSISTED LOWER ANTERIOR RESECTION N/A 12/11/2014   Procedure: XI ROBOTIC ASSISTED ABDOMINAL PERINEAL RESECTION OMENTOPLASTY;  Surgeon: Leighton Ruff, MD;  Location: [facility];  Service: General;  Laterality: N/A;       Home Medications    Prior to Admission medications   Medication Sig Start Date End Date  Taking? Authorizing Provider  acetaminophen (TYLENOL) 500 MG tablet Take 1,000 mg by mouth every 8 (eight) hours as needed for mild pain or headache.    [provider]  aspirin EC 81 MG tablet Take 162 mg by mouth at bedtime. Takes 30 minutes prior to Niacin    [provider]  cyanocobalamin 500 MCG tablet Take 500 mcg by mouth daily.    [provider]  losartan (COZAAR) 25 MG tablet Take 25 mg by mouth daily.    [provider]  metoprolol succinate (TOPROL-XL) 25 MG 24 hr tablet Take 25 mg by mouth 2 (two) times daily. Takes succinate 2 times daily 01/15/16   [provider]    niacin (NIASPAN) 500 MG CR tablet  01/29/17   [provider]  Omega-3 Fatty Acids (FISH OIL) 1200 MG CAPS Take 1,200 mg by mouth daily.    [provider]  omeprazole (PRILOSEC) 20 MG capsule Take 20 mg by mouth every evening.  07/23/14   [provider]  simvastatin (ZOCOR) 20 MG tablet Take 20 mg by mouth.  12/29/15   [provider]  traMADol (ULTRAM) 50 MG tablet Take 1-2 tablets (50-100 mg total) by mouth every 6 (six) hours as needed (mild pain not relieved by acetaminophen). 04/15/49   Leighton Ruff, MD    Family History Family History  Problem Relation Age of Onset  . Diabetes Mother   . Heart disease Mother   . Heart disease Father   . Colon cancer Sister   . Prostate cancer Brother   . Esophageal cancer Neg Hx   . Rectal cancer Neg Hx   . Stomach cancer Neg Hx     Social History Social History   Tobacco Use  . Smoking status: Never Smoker  . Smokeless tobacco: Never Used  Substance Use Topics  . Alcohol use: Yes    Alcohol/week: 0.0 oz    Comment: seldom  . Drug use: No     Allergies   Morphine and related and Oxycodone   Review of Systems Review of Systems  Constitutional: Negative for chills and fever.  Respiratory: Negative for cough, chest tightness and shortness of breath.   Cardiovascular: Negative for chest pain, palpitations and leg swelling.  Gastrointestinal: Positive for nausea and vomiting. Negative for abdominal distention, abdominal pain and diarrhea.  Genitourinary: Negative for dysuria, frequency, hematuria and urgency.  Musculoskeletal: Negative for arthralgias, myalgias, neck pain and neck stiffness.  Skin: Negative for rash.  Allergic/Immunologic: Negative for immunocompromised state.  Neurological: Negative for dizziness, weakness, light-headedness, numbness and headaches.  All other systems reviewed and are negative.    Physical Exam Updated Vital Signs BP (!) 157/72 (BP Location: Right Arm)    Pulse 73   Temp 98.3 F (36.8 C) (Oral)   Resp 20   Ht 5\' 7"  (1.702 m)   Wt 87.5 kg (193 lb)   SpO2 98%   BMI 30.23 kg/m   Physical Exam  Constitutional: He appears well-developed and well-nourished. No distress.  HENT:  Head: Normocephalic and atraumatic.  Eyes: Conjunctivae are normal.  Neck: Neck supple.  Cardiovascular: Normal rate, regular rhythm and normal heart sounds.  Pulmonary/Chest: Effort normal. No respiratory distress. He has no wheezes. He has no rales.  Abdominal:  Urostomy in place, yellowish pink urine in the bag.  Colostomy in the left lower quadrant, no output in the colostomy bag.  Large midline abdominal incision, there is mild erythema around the lower incision with a  JP drain in place yellowish bloody output in the drain.  Musculoskeletal: He exhibits no edema.  Neurological: He is alert.  Skin: Skin is warm and dry.  Nursing note and vitals reviewed.    ED Treatments / Results  Labs (all labs ordered are listed, but only abnormal results are displayed) Labs Reviewed  COMPREHENSIVE METABOLIC PANEL - Abnormal; Notable for the following components:      Result Value   Glucose, Bld 105 (*)    BUN 29 (*)    Albumin 3.3 (*)    GFR calc non Af Amer 59 (*)    All other components within normal limits  CBC - Abnormal; Notable for the following components:   WBC 13.6 (*)    All other components within normal limits  URINALYSIS, ROUTINE W REFLEX MICROSCOPIC - Abnormal; Notable for the following components:   APPearance HAZY (*)    Ketones, ur 5 (*)    Protein, ur 30 (*)    Leukocytes, UA TRACE (*)    Bacteria, UA MANY (*)    Non Squamous Epithelial 0-5 (*)    All other components within normal limits  LIPASE, BLOOD  CBC WITH DIFFERENTIAL/PLATELET  BASIC METABOLIC PANEL    EKG  EKG Interpretation None       Radiology Ct Abdomen Pelvis W Contrast  Result Date: 03/05/2017 CLINICAL DATA:  Hernia repair on Thursday. Vomiting with eating. No  bowel movement. Possible ileus. JP drain change yesterday. Per clinical data on previous CT report, history of rectal carcinoma and prostate carcinoma. EXAM: CT ABDOMEN AND PELVIS WITH CONTRAST TECHNIQUE: Multidetector CT imaging of the abdomen and pelvis was performed using the standard protocol following bolus administration of intravenous contrast. CONTRAST:  136mL ISOVUE-300 IOPAMIDOL (ISOVUE-300) INJECTION 61% COMPARISON:  CT abdomen dated 02/03/2017. FINDINGS: Lower chest: Mild scarring/atelectasis at each lung base. Hepatobiliary: Small layering stones within the otherwise normal-appearing gallbladder. No focal liver abnormality is seen. No bile duct dilatation. Pancreas: Unremarkable. No pancreatic ductal dilatation or surrounding inflammatory changes. Spleen: Normal in size without focal abnormality. Adrenals/Urinary Tract: Adrenal glands appear normal. Bilateral renal cysts. No renal stone or hydronephrosis bilaterally. No ureteral or bladder calculi identified. Surgical changes of cystectomy and ileal conduit creation. No evidence of obstruction or inflammation at the conduit. Stomach/Bowel: No obstruction or inflammation at the left lower quadrant colostomy site. Large bowel otherwise normal in caliber and configuration. No colonic wall thickening or evidence of bowel wall inflammation. There is mild small bowel dilatation, with associated air-fluid levels, at and proximal to the anterior abdominal wall hernia. There are relatively decompressed small bowel loops distal to the anterior abdominal wall hernia site raising the possibility of a partial small bowel obstruction within the hernia. There is, however, a wide opening to the hernia sac and no obvious septations or loculations within the hernia sac itself. Vascular/Lymphatic: Aortic atherosclerosis. No enlarged abdominal or pelvic lymph nodes. Reproductive: Status post prostatectomy. Brachy therapy seeds within the lower pelvis. Other: No free fluid  or abscess collection identified. No free intraperitoneal air. Drainage catheter enters from the right lower abdominal wall and extends to the anterior margin of the ventral abdominal wall hernia site. Musculoskeletal: Degenerative spurring throughout the slightly scoliotic thoracolumbar spine, mild to moderate in degree. No acute or suspicious osseous finding. IMPRESSION: 1. Mildly distended small bowel loops throughout the abdomen and pelvis, with associated air-fluid levels, consistent with ileus versus partial small bowel obstruction. There are relatively decompressed small bowel loops distal to the anterior abdominal  wall hernia site increasing my suspicion for partial small bowel obstruction within or adjacent to the hernia site, rather than ileus, and there is a questionable associated transition zone at the left inferolateral margin of the hernia sac. 2. Foci of air at the left inferolateral margin of the hernia sac which are of uncertain relationship to the adjacent bowel loops, possibly extraluminal air. Viscus perforation cannot be excluded, however, the air is more likely either within adjacent bowel or related to the adjacent percutaneous drainage catheter which terminates at the anterior margin of the ventral abdominal wall hernia sac. 3. Thickening of the walls of the small bowel within the hernia sac, likely reactive in nature, but differential would also include bowel ischemia and infectious enteritis. 4. No obstruction or inflammation at the left lower quadrant colostomy site. 5. Cholelithiasis without evidence of acute cholecystitis. 6. Aortic atherosclerosis. These results were called by telephone at the time of interpretation on 03/05/2017 at 5:42 pm to Dr. Jeannett Senior , who verbally acknowledged these results. Electronically Signed   By: Franki Cabot M.D.   On: 03/05/2017 17:45    Procedures Procedures (including critical care time)  Medications Ordered in ED Medications - No data  to display   Initial Impression / Assessment and Plan / ED Course  I have reviewed the triage vital signs and the nursing notes.  Pertinent labs & imaging results that were available during my care of the patient were reviewed by me and considered in my medical decision making (see chart for details).     Patient in emergency department with nausea, vomiting, no output since his surgery and in his colostomy bag.  Unable to keep anything down.  Patient has normal vital signs, he is nontoxic appearing.  Currently denies any complaints including no pain or nausea.  He states nausea is only there when he tries to eat.  Will get labs, will get CT abdomen pelvis to rule out bowel obstruction.  Most likely ileus due to recent surgery.  Will contact [place] surgery after imaging and labs are back.   6:08 PM CT showing ileus versus partial small bowel obstruction.  Patient continues to appear very comfortable, abdomen is minimally tender, no vomiting or nausea at this time.  I discussed case with Dr. Dema Severin with general surgery, they will come by and see him.  General surgery to admit.   Vitals:   03/05/17 1845 03/05/17 1850 03/05/17 1930 03/05/17 2005  BP: (!) 143/77 (!) 143/77 139/69 (!) 159/64  Pulse: 73 73 74 76  Resp:  20 19 19   Temp:    98.2 F (36.8 C)  TempSrc:    Oral  SpO2: 98% 97% 98% 99%  Weight:      Height:          Final Clinical Impressions(s) / ED Diagnoses   Final diagnoses:  Small bowel obstruction [facility])    ED Discharge Orders    None       Jeannett Senior, PA-C 03/06/17 0012    Carmin Muskrat, MD 03/07/17 1116

## 2017-03-05 NOTE — ED Triage Notes (Signed)
Pt had hernia repair on Thursday, has been vomiting every time he eats and has not had a BM, Dr Dema Severin requested he come in due to possible illus. Pt had JP drain changed yesterday. Pt also has reflux which is chronic

## 2017-03-05 NOTE — ED Notes (Signed)
Bed: WA09 Expected date:  Expected time:  Means of arrival:  Comments: Held for triage 2

## 2017-03-05 NOTE — ED Notes (Signed)
ED TO INPATIENT HANDOFF REPORT  Name/Age/Gender Talbert Forest 84 y.o. male  Code Status Code Status History    Date Active Date Inactive Code Status Order ID Comments User Context   03/02/2017 11:33 03/03/2017 15:58 Full Code [id number]  Leighton Ruff, MD Inpatient   12/11/2014 17:56 12/17/2014 17:05 Full Code [id number]  Leighton Ruff, MD Inpatient      Home/SNF/Other Home  Chief Complaint acid reflux  Level of Care/Admitting Diagnosis ED Disposition    ED Disposition Condition [facility]: [place] [[id number]]  Level of Care: Med-Surg [16]  Diagnosis: SBO (small bowel obstruction) [facility]) [[id number]]  Admitting Physician: Leighton Ruff [[id number]]  Attending Physician: Leighton Ruff [[id number]]  Estimated length of stay: 3 - 4 days  Certification:: I certify this patient will need inpatient services for at least 2 midnights  PT Class (Do Not Modify): Inpatient [101]  PT Acc Code (Do Not Modify): Private [1]       Medical History Past Medical History:  Diagnosis Date  . Arthritis   . Colostomy in place [facility])   . GERD (gastroesophageal reflux disease)   . History of colon polyps   . History of hiatal hernia   . 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 HTN (hypertension)    followed by pcp  . Hyperlipidemia   . Presence of urostomy ([place])   . Rectal cancer ([place]) 10/07/2014   no chemo or radiation/ bladder was removed also  . Tinnitus    per pt intermittant   . Wears glasses     Allergies Allergies  Allergen Reactions  . Morphine And Related     Caused hallucinations.  . Oxycodone     Caused hallucinations    IV Location/Drains/Wounds Patient Lines/Drains/Airways Status   Active Line/Drains/Airways    Name:   Placement date:   Placement time:   Site:   Days:   Peripheral IV 12/11/14 Left Wrist   12/11/14    0642     Wrist   815   Peripheral IV 12/11/14 Right Hand   12/11/14    0648    Hand   815   Peripheral IV 03/02/17 Left Hand   03/02/17    0733    Hand   3   Peripheral IV 03/05/17 Left Hand   03/05/17    1539    Hand   less than 1   Closed System Drain Right Abdomen Bulb (JP) 19 Fr.   12/11/14    1400    Abdomen   815   Closed System Drain Right Abdomen Bulb (JP) 19 Fr.   03/02/17    0945    Abdomen   3   Colostomy LUQ   12/11/14    1355    LUQ   815   Urostomy RUQ   12/11/14    -    RUQ   815   Ureteral Drain/Stent Left ureter 6 Fr.   12/11/14    0805    Left ureter   815   Ureteral Drain/Stent Right ureter 7 Fr.   12/11/14    1458    Right ureter   815   Ureteral Drain/Stent Left ureter 7 Fr.   12/11/14    1458    Left ureter   815   Incision (Closed) 12/11/14 Abdomen Other (Comment)  12/11/14    1541     815   Incision (Closed) 12/11/14 Perineum Other (Comment)   12/11/14    1620     815   Incision (Closed) 03/02/17 Abdomen   03/02/17    1005     3   Incision - 3 Ports Abdomen 1: Right 2: Mid 3: Left   12/11/14    0842     815          Labs/Imaging Results for orders placed or performed during the hospital encounter of 03/05/17 (from the past 48 hour(s))  Lipase, blood     Status: None   Collection Time: 03/05/17  3:24 PM  Result Value Ref Range   Lipase 26 11 - 51 U/L    Comment: Performed at [facility], [place] [address], [place], [place] [address]  Comprehensive metabolic panel     Status: Abnormal   Collection Time: 03/05/17  3:24 PM  Result Value Ref Range   Sodium 136 135 - 145 mmol/L   Potassium 3.9 3.5 - 5.1 mmol/L   Chloride 103 101 - 111 mmol/L   CO2 24 22 - 32 mmol/L   Glucose, Bld 105 (H) 65 - 99 mg/dL   BUN 29 (H) 6 - 20 mg/dL   Creatinine, Ser 1.12 0.61 - 1.24 mg/dL   Calcium 8.9 8.9 - 10.3 mg/dL   Total Protein 6.7 6.5 - 8.1 g/dL   Albumin 3.3 (L) 3.5 - 5.0 g/dL   AST 36 15 - 41 U/L   ALT 35 17 - 63 U/L   Alkaline Phosphatase 74 38 - 126 U/L    Total Bilirubin 1.1 0.3 - 1.2 mg/dL   GFR calc non Af Amer 59 (L) >60 mL/min   GFR calc Af Amer >60 >60 mL/min    Comment: (NOTE) The eGFR has been calculated using the CKD EPI equation. This calculation has not been validated in all clinical situations. eGFR's persistently <60 mL/min signify possible Chronic Kidney Disease.    Anion gap 9 5 - 15    Comment: Performed at [facility], [place] [address] [place], [place] [address]  CBC     Status: Abnormal   Collection Time: 03/05/17  3:24 PM  Result Value Ref Range   WBC 13.6 (H) 4.0 - 10.5 K/uL   RBC 4.45 4.22 - 5.81 MIL/uL   Hemoglobin 14.5 13.0 - 17.0 g/dL   HCT 41.8 39.0 - 52.0 %   MCV 93.9 78.0 - 100.0 fL   MCH 32.6 26.0 - 34.0 pg   MCHC 34.7 30.0 - 36.0 g/dL   RDW 13.6 11.5 - 15.5 %   Platelets 185 150 - 400 K/uL    Comment: Performed at [facility], [place] [address] [place], [place] [address]  Urinalysis, Routine w reflex microscopic     Status: Abnormal   Collection Time: 03/05/17  3:34 PM  Result Value Ref Range   Color, Urine YELLOW YELLOW   APPearance HAZY (A) CLEAR   Specific Gravity, Urine 1.017 1.005 - 1.030   pH 7.0 5.0 - 8.0   Glucose, UA NEGATIVE NEGATIVE mg/dL   Hgb urine dipstick NEGATIVE NEGATIVE   Bilirubin Urine NEGATIVE NEGATIVE   Ketones, ur 5 (A) NEGATIVE mg/dL   Protein, ur 30 (A) NEGATIVE mg/dL   Nitrite NEGATIVE NEGATIVE   Leukocytes, UA TRACE (A) NEGATIVE   RBC / HPF 0-5 0 - 5 RBC/hpf   WBC, UA 6-30 0 -  5 WBC/hpf   Bacteria, UA MANY (A) NONE SEEN   Squamous Epithelial / LPF NONE SEEN NONE SEEN   Mucus PRESENT    Hyaline Casts, UA PRESENT    Non Squamous Epithelial 0-5 (A) NONE SEEN    Comment: Performed at [facility], [place] [address], [place], [place] [address]   Ct Abdomen Pelvis W Contrast  Result Date: 03/05/2017 CLINICAL DATA:  Hernia repair on Thursday. Vomiting with eating. No bowel movement. Possible ileus. JP drain change  yesterday. Per clinical data on previous CT report, history of rectal carcinoma and prostate carcinoma. EXAM: CT ABDOMEN AND PELVIS WITH CONTRAST TECHNIQUE: Multidetector CT imaging of the abdomen and pelvis was performed using the standard protocol following bolus administration of intravenous contrast. CONTRAST:  193m ISOVUE-300 IOPAMIDOL (ISOVUE-300) INJECTION 61% COMPARISON:  CT abdomen dated 02/03/2017. FINDINGS: Lower chest: Mild scarring/atelectasis at each lung base. Hepatobiliary: Small layering stones within the otherwise normal-appearing gallbladder. No focal liver abnormality is seen. No bile duct dilatation. Pancreas: Unremarkable. No pancreatic ductal dilatation or surrounding inflammatory changes. Spleen: Normal in size without focal abnormality. Adrenals/Urinary Tract: Adrenal glands appear normal. Bilateral renal cysts. No renal stone or hydronephrosis bilaterally. No ureteral or bladder calculi identified. Surgical changes of cystectomy and ileal conduit creation. No evidence of obstruction or inflammation at the conduit. Stomach/Bowel: No obstruction or inflammation at the left lower quadrant colostomy site. Large bowel otherwise normal in caliber and configuration. No colonic wall thickening or evidence of bowel wall inflammation. There is mild small bowel dilatation, with associated air-fluid levels, at and proximal to the anterior abdominal wall hernia. There are relatively decompressed small bowel loops distal to the anterior abdominal wall hernia site raising the possibility of a partial small bowel obstruction within the hernia. There is, however, a wide opening to the hernia sac and no obvious septations or loculations within the hernia sac itself. Vascular/Lymphatic: Aortic atherosclerosis. No enlarged abdominal or pelvic lymph nodes. Reproductive: Status post prostatectomy. Brachy therapy seeds within the lower pelvis. Other: No free fluid or abscess collection identified. No free  intraperitoneal air. Drainage catheter enters from the right lower abdominal wall and extends to the anterior margin of the ventral abdominal wall hernia site. Musculoskeletal: Degenerative spurring throughout the slightly scoliotic thoracolumbar spine, mild to moderate in degree. No acute or suspicious osseous finding. IMPRESSION: 1. Mildly distended small bowel loops throughout the abdomen and pelvis, with associated air-fluid levels, consistent with ileus versus partial small bowel obstruction. There are relatively decompressed small bowel loops distal to the anterior abdominal wall hernia site increasing my suspicion for partial small bowel obstruction within or adjacent to the hernia site, rather than ileus, and there is a questionable associated transition zone at the left inferolateral margin of the hernia sac. 2. Foci of air at the left inferolateral margin of the hernia sac which are of uncertain relationship to the adjacent bowel loops, possibly extraluminal air. Viscus perforation cannot be excluded, however, the air is more likely either within adjacent bowel or related to the adjacent percutaneous drainage catheter which terminates at the anterior margin of the ventral abdominal wall hernia sac. 3. Thickening of the walls of the small bowel within the hernia sac, likely reactive in nature, but differential would also include bowel ischemia and infectious enteritis. 4. No obstruction or inflammation at the left lower quadrant colostomy site. 5. Cholelithiasis without evidence of acute cholecystitis. 6. Aortic atherosclerosis. These results were called by telephone at the time of interpretation on 03/05/2017 at  5:42 pm to Dr. Jeannett Senior , who verbally acknowledged these results. Electronically Signed   By: Franki Cabot M.D.   On: 03/05/2017 17:45    Pending Labs [employer] (From admission, onward)   Start     Ordered   Signed and Held  CBC  (heparin)  Once,   R    Comments:  Baseline  for heparin therapy IF NOT ALREADY DRAWN.  Notify MD if PLT < 100 K.    Signed and Held   Signed and Held  Creatinine, serum  (heparin)  Once,   R    Comments:  Baseline for heparin therapy IF NOT ALREADY DRAWN.    Signed and Held   Signed and Held  CBC WITH DIFFERENTIAL  Tomorrow morning,   R     Signed and Held   Signed and Held  Basic metabolic panel  Tomorrow morning,   R     Signed and Held      Vitals/Pain Today's Vitals   03/05/17 1630 03/05/17 1715 03/05/17 1800 03/05/17 1850  BP: 131/69 139/64 (!) 143/69 (!) 143/77  Pulse: 66 74 70 73  Resp:   18 20  Temp:      TempSrc:      SpO2: 99% 97% 96% 97%  Weight:      Height:      PainSc:        Isolation Precautions No active isolations  Medications Medications  0.9 %  sodium chloride infusion ( Intravenous Stopped 03/05/17 1837)  iopamidol (ISOVUE-300) 61 % injection (100 mLs  Contrast Given 03/05/17 1654)    Mobility walks with person assist

## 2017-03-06 ENCOUNTER — Encounter (HOSPITAL_COMMUNITY)
Admission: EM | Disposition: A | Discharge status: Home Health | Payer: Self-pay | Source: Home / Self Care | Attending: General Surgery

## 2017-03-06 ENCOUNTER — Inpatient Hospital Stay (HOSPITAL_COMMUNITY): Payer: PPO | Admitting: Certified Registered Nurse Anesthetist

## 2017-03-06 ENCOUNTER — Inpatient Hospital Stay (HOSPITAL_COMMUNITY): Payer: PPO

## 2017-03-06 ENCOUNTER — Encounter (HOSPITAL_COMMUNITY): Payer: Self-pay | Admitting: Anesthesiology

## 2017-03-06 HISTORY — PX: LAPAROSCOPIC INTERNAL HERNIA REPAIR: SHX6502

## 2017-03-06 LAB — CBC WITH DIFFERENTIAL/PLATELET
BASOS ABS: 0 10*3/uL (ref 0.0–0.1)
BASOS PCT: 0 %
Eosinophils Absolute: 0.6 10*3/uL (ref 0.0–0.7)
Eosinophils Relative: 6 %
HEMATOCRIT: 39.3 % (ref 39.0–52.0)
Hemoglobin: 13.3 g/dL (ref 13.0–17.0)
Lymphocytes Relative: 17 %
Lymphs Abs: 1.8 10*3/uL (ref 0.7–4.0)
MCH: 31.8 pg (ref 26.0–34.0)
MCHC: 33.8 g/dL (ref 30.0–36.0)
MCV: 94 fL (ref 78.0–100.0)
MONO ABS: 1 10*3/uL (ref 0.1–1.0)
MONOS PCT: 9 %
NEUTROS ABS: 7.3 10*3/uL (ref 1.7–7.7)
Neutrophils Relative %: 68 %
PLATELETS: 190 10*3/uL (ref 150–400)
RBC: 4.18 MIL/uL — ABNORMAL LOW (ref 4.22–5.81)
RDW: 13.5 % (ref 11.5–15.5)
WBC: 10.7 10*3/uL — ABNORMAL HIGH (ref 4.0–10.5)

## 2017-03-06 LAB — BASIC METABOLIC PANEL
Anion gap: 7 (ref 5–15)
BUN: 25 mg/dL — ABNORMAL HIGH (ref 6–20)
CALCIUM: 8.1 mg/dL — AB (ref 8.9–10.3)
CO2: 24 mmol/L (ref 22–32)
CREATININE: 1.04 mg/dL (ref 0.61–1.24)
Chloride: 107 mmol/L (ref 101–111)
GLUCOSE: 88 mg/dL (ref 65–99)
Potassium: 3.7 mmol/L (ref 3.5–5.1)
Sodium: 138 mmol/L (ref 135–145)

## 2017-03-06 SURGERY — LAPAROSCOPIC INTERNAL HERNIA REPAIR
Anesthesia: General | Site: Abdomen

## 2017-03-06 MED ORDER — ROCURONIUM BROMIDE 10 MG/ML (PF) SYRINGE
PREFILLED_SYRINGE | INTRAVENOUS | Status: DC | PRN
  Administered 2017-03-06 (×3): 10 mg via INTRAVENOUS
  Administered 2017-03-06: 50 mg via INTRAVENOUS

## 2017-03-06 MED ORDER — CHLORHEXIDINE GLUCONATE 0.12 % MT SOLN: 15.0000 mL | Freq: Two times a day (BID) | OROMUCOSAL | Status: DC

## 2017-03-06 MED ORDER — BUPIVACAINE LIPOSOME 1.3 % IJ SUSP
20.0000 mL | Freq: Once | INTRAMUSCULAR | Status: AC
  Administered 2017-03-06: 20 mL
  Filled 2017-03-06: qty 20

## 2017-03-06 MED ORDER — LIDOCAINE 2% (20 MG/ML) 5 ML SYRINGE
INTRAMUSCULAR | Status: AC
  Filled 2017-03-06: qty 5

## 2017-03-06 MED ORDER — FENTANYL CITRATE (PF) 100 MCG/2ML IJ SOLN
25.0000 ug | INTRAMUSCULAR | Status: DC | PRN
Start: 2017-03-06 — End: 2017-03-06
  Administered 2017-03-06: 50 ug via INTRAVENOUS

## 2017-03-06 MED ORDER — PHENYLEPHRINE 40 MCG/ML (10ML) SYRINGE FOR IV PUSH (FOR BLOOD PRESSURE SUPPORT)
PREFILLED_SYRINGE | INTRAVENOUS | Status: DC | PRN
  Administered 2017-03-06: 80 ug via INTRAVENOUS

## 2017-03-06 MED ORDER — ESMOLOL HCL 100 MG/10ML IV SOLN
INTRAVENOUS | Status: DC | PRN
  Administered 2017-03-06: 20 mg via INTRAVENOUS

## 2017-03-06 MED ORDER — CEFAZOLIN SODIUM-DEXTROSE 2-4 GM/100ML-% IV SOLN
INTRAVENOUS | Status: AC
  Filled 2017-03-06: qty 100

## 2017-03-06 MED ORDER — CEFAZOLIN SODIUM-DEXTROSE 2-3 GM-%(50ML) IV SOLR
INTRAVENOUS | Status: DC | PRN
  Administered 2017-03-06: 2 g via INTRAVENOUS

## 2017-03-06 MED ORDER — ORAL CARE MOUTH RINSE
15.0000 mL | Freq: Two times a day (BID) | OROMUCOSAL | Status: DC
  Administered 2017-03-07 – 2017-03-12 (×7): 15 mL via OROMUCOSAL

## 2017-03-06 MED ORDER — ESMOLOL HCL 100 MG/10ML IV SOLN
INTRAVENOUS | Status: AC
  Filled 2017-03-06: qty 10

## 2017-03-06 MED ORDER — ROCURONIUM BROMIDE 10 MG/ML (PF) SYRINGE
PREFILLED_SYRINGE | INTRAVENOUS | Status: AC
  Filled 2017-03-06: qty 5

## 2017-03-06 MED ORDER — MEPERIDINE HCL 50 MG/ML IJ SOLN: 6.2500 mg | INTRAMUSCULAR | Status: DC | PRN

## 2017-03-06 MED ORDER — PHENYLEPHRINE HCL 10 MG/ML IJ SOLN
INTRAVENOUS | Status: DC | PRN
  Administered 2017-03-06: 40 ug/min via INTRAVENOUS

## 2017-03-06 MED ORDER — FENTANYL CITRATE (PF) 100 MCG/2ML IJ SOLN
INTRAMUSCULAR | Status: DC | PRN
  Administered 2017-03-06: 100 ug via INTRAVENOUS
  Administered 2017-03-06 (×3): 50 ug via INTRAVENOUS

## 2017-03-06 MED ORDER — BUPIVACAINE-EPINEPHRINE 0.25% -1:200000 IJ SOLN
INTRAMUSCULAR | Status: AC
  Filled 2017-03-06: qty 1

## 2017-03-06 MED ORDER — SUGAMMADEX SODIUM 200 MG/2ML IV SOLN
INTRAVENOUS | Status: DC | PRN
  Administered 2017-03-06: 175 mg via INTRAVENOUS

## 2017-03-06 MED ORDER — DEXAMETHASONE SODIUM PHOSPHATE 10 MG/ML IJ SOLN
INTRAMUSCULAR | Status: AC
  Filled 2017-03-06: qty 1

## 2017-03-06 MED ORDER — DEXAMETHASONE SODIUM PHOSPHATE 10 MG/ML IJ SOLN
INTRAMUSCULAR | Status: DC | PRN
  Administered 2017-03-06: 10 mg via INTRAVENOUS

## 2017-03-06 MED ORDER — ONDANSETRON HCL 4 MG/2ML IJ SOLN
INTRAMUSCULAR | Status: DC | PRN
  Administered 2017-03-06: 4 mg via INTRAVENOUS

## 2017-03-06 MED ORDER — PHENYLEPHRINE HCL 10 MG/ML IJ SOLN
INTRAMUSCULAR | Status: AC
  Filled 2017-03-06: qty 1

## 2017-03-06 MED ORDER — LIDOCAINE 2% (20 MG/ML) 5 ML SYRINGE
INTRAMUSCULAR | Status: DC | PRN
  Administered 2017-03-06: 50 mg via INTRAVENOUS

## 2017-03-06 MED ORDER — BUPIVACAINE-EPINEPHRINE (PF) 0.25% -1:200000 IJ SOLN
INTRAMUSCULAR | Status: DC | PRN
  Administered 2017-03-06: 50 mL

## 2017-03-06 MED ORDER — KETOROLAC TROMETHAMINE 15 MG/ML IJ SOLN
15.0000 mg | Freq: Four times a day (QID) | INTRAMUSCULAR | Status: AC | PRN
  Administered 2017-03-07 – 2017-03-10 (×7): 15 mg via INTRAVENOUS
  Filled 2017-03-06 (×7): qty 1

## 2017-03-06 MED ORDER — FENTANYL CITRATE (PF) 250 MCG/5ML IJ SOLN
INTRAMUSCULAR | Status: AC
  Filled 2017-03-06: qty 5

## 2017-03-06 MED ORDER — HYDROMORPHONE HCL 1 MG/ML IJ SOLN
0.5000 mg | INTRAMUSCULAR | Status: DC | PRN
  Administered 2017-03-11 – 2017-03-12 (×2): 0.5 mg via INTRAVENOUS
  Filled 2017-03-06 (×2): qty 0.5

## 2017-03-06 MED ORDER — SUGAMMADEX SODIUM 200 MG/2ML IV SOLN
INTRAVENOUS | Status: AC
  Filled 2017-03-06: qty 2

## 2017-03-06 MED ORDER — 0.9 % SODIUM CHLORIDE (POUR BTL) OPTIME
TOPICAL | Status: DC | PRN
  Administered 2017-03-06: 1000 mL

## 2017-03-06 MED ORDER — PNEUMOCOCCAL VAC POLYVALENT 25 MCG/0.5ML IJ INJ
0.5000 mL | INJECTION | INTRAMUSCULAR | Status: AC
  Administered 2017-03-08: 0.5 mL via INTRAMUSCULAR
  Filled 2017-03-06 (×2): qty 0.5

## 2017-03-06 MED ORDER — ACETAMINOPHEN 10 MG/ML IV SOLN
1000.0000 mg | Freq: Four times a day (QID) | INTRAVENOUS | Status: AC
  Administered 2017-03-06 – 2017-03-07 (×4): 1000 mg via INTRAVENOUS
  Filled 2017-03-06 (×4): qty 100

## 2017-03-06 MED ORDER — PROPOFOL 10 MG/ML IV BOLUS
INTRAVENOUS | Status: DC | PRN
  Administered 2017-03-06: 150 mg via INTRAVENOUS

## 2017-03-06 MED ORDER — ONDANSETRON HCL 4 MG/2ML IJ SOLN
INTRAMUSCULAR | Status: AC
  Filled 2017-03-06: qty 2

## 2017-03-06 MED ORDER — LACTATED RINGERS IV SOLN
INTRAVENOUS | Status: DC
  Administered 2017-03-06 – 2017-03-07 (×4): via INTRAVENOUS

## 2017-03-06 MED ORDER — PHENYLEPHRINE 40 MCG/ML (10ML) SYRINGE FOR IV PUSH (FOR BLOOD PRESSURE SUPPORT)
PREFILLED_SYRINGE | INTRAVENOUS | Status: AC
  Filled 2017-03-06: qty 10

## 2017-03-06 SURGICAL SUPPLY — 61 items
BARRIER SKIN 2 1/4 (WOUND CARE) ×2 IMPLANT
BARRIER SKIN 2 1/4INCH (WOUND CARE) ×1
BARRIER SKIN 2 3/4 (OSTOMY) ×2 IMPLANT
BARRIER SKIN 2 3/4 INCH (OSTOMY) ×1
BINDER ABDOMINAL 12 ML 46-62 (SOFTGOODS) ×3 IMPLANT
CLIP SUT LAPRA TY ABSORB (SUTURE) IMPLANT
DECANTER SPIKE VIAL GLASS SM (MISCELLANEOUS) IMPLANT
DERMABOND ADVANCED (GAUZE/BANDAGES/DRESSINGS) ×2
DERMABOND ADVANCED .7 DNX12 (GAUZE/BANDAGES/DRESSINGS) ×1 IMPLANT
DEVICE PMI PUNCTURE CLOSURE (MISCELLANEOUS) ×3 IMPLANT
DEVICE SECURE STRAP 25 ABSORB (INSTRUMENTS) ×9 IMPLANT
DEVICE SUTURE ENDOST 10MM (ENDOMECHANICALS) IMPLANT
DISSECTOR BLUNT TIP ENDO 5MM (MISCELLANEOUS) IMPLANT
DRAIN CHANNEL RND F F (WOUND CARE) ×3 IMPLANT
DRSG OPSITE POSTOP 4X6 (GAUZE/BANDAGES/DRESSINGS) ×3 IMPLANT
ELECT REM PT RETURN 15FT ADLT (MISCELLANEOUS) ×3 IMPLANT
EVACUATOR SILICONE 100CC (DRAIN) ×3 IMPLANT
GAUZE SPONGE 4X4 12PLY STRL (GAUZE/BANDAGES/DRESSINGS) ×3 IMPLANT
GLOVE BIOGEL M 8.0 STRL (GLOVE) IMPLANT
GLOVE BIOGEL PI IND STRL 7.0 (GLOVE) IMPLANT
GLOVE BIOGEL PI INDICATOR 7.0 (GLOVE)
GOWN SPEC L4 XLG W/TWL (GOWN DISPOSABLE) ×3 IMPLANT
GOWN STRL REUS W/TWL LRG LVL3 (GOWN DISPOSABLE) ×3 IMPLANT
GOWN STRL REUS W/TWL XL LVL3 (GOWN DISPOSABLE) ×9 IMPLANT
KIT BASIN OR (CUSTOM PROCEDURE TRAY) ×3 IMPLANT
MESH VICRYL KNITTED 12X12 (Mesh General) ×3 IMPLANT
NEEDLE HYPO 22GX1.5 SAFETY (NEEDLE) ×3 IMPLANT
NEEDLE SPNL 22GX3.5 QUINCKE BK (NEEDLE) IMPLANT
NS IRRIG 1000ML POUR BTL (IV SOLUTION) ×6 IMPLANT
PACK GENERAL/GYN (CUSTOM PROCEDURE TRAY) ×3 IMPLANT
PAD POSITIONING PINK XL (MISCELLANEOUS) IMPLANT
PAD TELFA 2X3 NADH STRL (GAUZE/BANDAGES/DRESSINGS) ×3 IMPLANT
POSITIONER SURGICAL ARM (MISCELLANEOUS) IMPLANT
RELOAD ENDO STITCH 2.0 (ENDOMECHANICALS)
SET IRRIG TUBING LAPAROSCOPIC (IRRIGATION / IRRIGATOR) IMPLANT
SHEARS HARMONIC ACE PLUS 36CM (ENDOMECHANICALS) IMPLANT
SHEARS HARMONIC ACE PLUS 45CM (MISCELLANEOUS) IMPLANT
SLEEVE XCEL OPT CAN 5 100 (ENDOMECHANICALS) ×6 IMPLANT
SOLUTION ANTI FOG 6CC (MISCELLANEOUS) IMPLANT
SPONGE DRAIN TRACH 4X4 STRL 2S (GAUZE/BANDAGES/DRESSINGS) ×3 IMPLANT
SPONGE LAP 18X18 X RAY DECT (DISPOSABLE) ×6 IMPLANT
STAPLER VISISTAT 35W (STAPLE) ×3 IMPLANT
SUCTION POOLE TIP (SUCTIONS) ×3 IMPLANT
SUT ETHILON 2 0 PS N (SUTURE) ×3 IMPLANT
SUT NOVA NAB DX-16 0-1 5-0 T12 (SUTURE) ×9 IMPLANT
SUT PDS AB 0 CT1 36 (SUTURE) ×15 IMPLANT
SUT RELOAD ENDO STITCH 2.0 (ENDOMECHANICALS)
SUT SILK 2 0 SH (SUTURE) IMPLANT
SUT VIC AB 2-0 SH 18 (SUTURE) ×3 IMPLANT
SUT VIC AB 4-0 SH 18 (SUTURE) ×3 IMPLANT
SUTURE RELOAD ENDO STITCH 2.0 (ENDOMECHANICALS) IMPLANT
SYRINGE 20CC LL (MISCELLANEOUS) ×3 IMPLANT
TAPE CLOTH 4X10 WHT NS (GAUZE/BANDAGES/DRESSINGS) IMPLANT
TAPE CLOTH SURG 4X10 WHT LF (GAUZE/BANDAGES/DRESSINGS) ×3 IMPLANT
TOWEL OR 17X26 10 PK STRL BLUE (TOWEL DISPOSABLE) ×3 IMPLANT
TOWEL OR NON WOVEN STRL DISP B (DISPOSABLE) IMPLANT
TRAY FOLEY W/METER SILVER 16FR (SET/KITS/TRAYS/PACK) ×3 IMPLANT
TRAY LAPAROSCOPIC (CUSTOM PROCEDURE TRAY) IMPLANT
TROCAR BLADELESS OPT 5 100 (ENDOMECHANICALS) ×3 IMPLANT
TROCAR XCEL NON-BLD 11X100MML (ENDOMECHANICALS) IMPLANT
TUBING INSUF HEATED (TUBING) ×3 IMPLANT

## 2017-03-06 NOTE — Transfer of Care (Signed)
Immediate Anesthesia Transfer of Care Note  Patient: Ian Christensen  Procedure(s) Performed: Procedure(s): REOPENING OF RECENT LAPAROTOMY WITH CLOSURE OF INCISIONAL HERNIA WITH MESH (N/A)  Patient Location: PACU  Anesthesia Type:General  Level of Consciousness:  sedated, patient cooperative and responds to stimulation  Airway & Oxygen Therapy:Patient Spontanous Breathing and Patient connected to face mask oxgen  Post-op Assessment:  Report given to PACU RN and Post -op Vital signs reviewed and stable  Post vital signs:  Reviewed and stable  Last Vitals:  Vitals:   03/06/17 0855 03/06/17 1000  BP: (!) 148/70 (!) 155/63  Pulse: 76 76  Resp:  16  Temp:  36.6 C  SpO2:  86%    Complications: No apparent anesthesia complications

## 2017-03-06 NOTE — Anesthesia Postprocedure Evaluation (Signed)
Anesthesia Post Note  Patient: Ian Christensen  Procedure(s) Performed: REOPENING OF RECENT LAPAROTOMY WITH CLOSURE OF INCISIONAL HERNIA WITH MESH (N/A Abdomen)     Anesthesia Type: General    Last Vitals:  Vitals:   03/06/17 1630 03/06/17 1730  BP: (!) 133/55 126/65  Pulse: 89 90  Resp: 16 14  Temp: 36.4 C (!) 36.4 C  SpO2: 95% 96%    Last Pain:  Vitals:   03/06/17 1730  TempSrc: Oral  PainSc:                  Karin Pinedo

## 2017-03-06 NOTE — Progress Notes (Signed)
Patient arrived on the unit at approximately 1953 accompanied by his two daughters. He is alert and verbally responsive and has no acute complaints. He remains NPO and size #16 NGT was placed in right nostril at approximately 2030 and connected to LIWS.Placement confirmed by auscultation and length of tube marked with tape. Has minimal pink drainage in suction cannister. No activity noted from colostomy but urostomy is draining well. Will continue to monitor tube drainage for color and quantity.

## 2017-03-06 NOTE — Op Note (Signed)
03/05/2017 - 03/06/2017  1:35 PM  PATIENT:  Ian Christensen  84 y.o. male  Patient Care Team: Redmond School, MD as PCP - General (Internal Medicine)  PRE-OPERATIVE DIAGNOSIS:  incisional hernia  POST-OPERATIVE DIAGNOSIS:  incisional hernia  PROCEDURE:   REOPENING OF RECENT LAPAROTOMY WITH CLOSURE OF INCISIONAL HERNIA WITH MESH   Surgeon(s): Leighton Ruff, MD Alphonsa Overall, MD  ASSISTANT: Dr Lucia Gaskins   ANESTHESIA:   general  EBL: 34ml  Total I/O In: 1500 [I.V.:1500] Out: 256 [Urine:225; Drains:5; Stool:1; Blood:25]  DRAINS: (31F) Jackson-Pratt drain(s) with closed bulb suction in the rectal sheath   SPECIMEN:  No Specimen  DISPOSITION OF SPECIMEN:  N/A  COUNTS:  YES  PLAN OF CARE: Admit to inpatient   PATIENT DISPOSITION:  PACU - hemodynamically stable.  INDICATION: 84 year old male postop day 5 from incisional hernia repair with retrorectus mesh placement.  He presented to the hospital with evisceration and possible small bowel obstruction.  I recommended that we proceed to the operating room for closure of his wound.   OR FINDINGS: Evisceration through the upper portion of his posterior fascia and the left side of the mesh attachments.  DESCRIPTION: the patient was identified in the preoperative holding area and taken to the OR where they were laid supine on the operating room table.  General anesthesia was induced without difficulty. SCDs were also noted to be in place prior to the initiation of anesthesia.  The patient was then prepped and draped in the usual sterile fashion.   A surgical timeout was performed indicating the correct patient, procedure, positioning and need for preoperative antibiotics.   I began by reopening his incision at midline using a scalpel.  The Vicryl suture was removed.  I encountered small bowel underneath the skin.  This was gently freed from any attachments that had formed and was placed back into the abdomen.  The left side of the  Stratus mesh was free and the sutures appeared to have come untied.  The upper portion of the posterior closure was also open.  The lower portion and right side of the mesh was intact.  The anterior fascia was also intact posteriorly and open anteriorly.  I remove the remaining sutures and the Stratus mesh.  I open the remaining posterior sheath.  I then ran the bowel from the ligament of Treitz to the terminal ileum to confirm no other source of bowel obstruction.  This was then placed back into the abdomen.  I confirmed position of the NG tube within the stomach.  I cut a piece of 12 x 12 Vicryl mesh to approximately 10 x 12 cm and placed into the abdomen as an underlay.  I also placed 2 5 mm ports in the right upper and left upper quadrants for additional laparoscopic evaluation later in the case.  The Vicryl mesh was secured with 0 PDS sutures laterally around the wound.  Once this was complete, I then closed the posterior fascia and peritoneum using interrupted PDS sutures.  The Stratus mesh was then placed back into the retrorectus space and secured with 0 PDS sutures through the anterior and posterior fascial layers as well as through the mesh.  6 sutures were used to secure this into place.  Once this was completed, the anterior fascia was closed over a 19 [place] Blake drain.  This was done using interrupted #1 Novafil sutures.  The drain was secured in the right lower quadrant using a 2-0 nylon suture.  After this we  insufflated the abdomen through our previously placed the 5 mm ports.  The camera was placed into the abdomen and a absorbable tacker was used to tack the lateral edges of the Vicryl mesh circumferentially around the abdomen.  We made sure to cut out an opening for the colostomy and urostomy sites.  Once this was complete, the 4 Novafil sutures were placed trans-fascially under laparoscopic guidance using a laparoscopic suture passer.  These were then tied into place.  We use Exparel mixed with  half percent Marcaine to perform a bilateral tap block using laparoscopic visualization.  The abdomen was then desufflated.  The ports were removed.  The subcutaneous tissue was closed using interrupted 2-0 Vicryl suture.  The skin was closed using a running 4-0 Vicryl subcuticular suture.  A small Telfa wick was placed in the posterior aspect of the wound.  The port sites were closed using the 4-0 Vicryl suture.  Dermabond was placed on the port sites and a sterile dressing was placed on the midline wound.  The patient was then awakened from anesthesia and sent to the postanesthesia care unit in stable condition.  All counts were correct per operating room staff.

## 2017-03-06 NOTE — Anesthesia Procedure Notes (Addendum)
Procedure Name: Intubation Date/Time: 03/06/2017 11:14 AM Performed by: Anne Fu, CRNA Pre-anesthesia Checklist: Patient identified, Emergency Drugs available, Suction available, Patient being monitored and Timeout performed Patient Re-evaluated:Patient Re-evaluated prior to induction Oxygen Delivery Method: Circle system utilized Preoxygenation: Pre-oxygenation with 100% oxygen Induction Type: IV induction Ventilation: Mask ventilation without difficulty Laryngoscope Size: Mac and 4 Grade View: Grade II Tube type: Oral Tube size: 7.5 mm Number of attempts: 1 Airway Equipment and Method: Stylet Placement Confirmation: ETT inserted through vocal cords under direct vision,  positive ETCO2 and breath sounds checked- equal and bilateral Secured at: 21 cm Tube secured with: Tape Dental Injury: Teeth and Oropharynx as per pre-operative assessment  Difficulty Due To: Difficult Airway- due to reduced neck mobility and Difficult Airway- due to anterior larynx

## 2017-03-06 NOTE — Progress Notes (Signed)
Day of Surgery  Subjective: NG placed overnight, throat sore  Objective: Vital signs in last 24 hours: Temp:  [97.9 F (36.6 C)-98.4 F (36.9 C)] 97.9 F (36.6 C) (02/17 1000) Pulse Rate:  [66-92] 76 (02/17 1000) Resp:  [16-20] 16 (02/17 1000) BP: (131-159)/(60-79) 155/63 (02/17 1000) SpO2:  [93 %-99 %] 98 % (02/17 1000) Weight:  [87.5 kg (193 lb)-87.7 kg (193 lb 5.5 oz)] 87.7 kg (193 lb 5.5 oz) (02/16 2100)   Intake/Output from previous day: 02/16 0701 - 02/17 0700 In: 2127.4 [I.V.:2127.4] Out: 750 [Urine:700; Drains:50] Intake/Output this shift: Total I/O In: 500 [I.V.:500] Out: 231 [Urine:225; Drains:5; Stool:1]   General appearance: alert and cooperative GI: normal findings: soft, non-tender and distended  Incision: no significant drainage  Lab Results:  Recent Labs    03/05/17 1524 03/06/17 0528  WBC 13.6* 10.7*  HGB 14.5 13.3  HCT 41.8 39.3  PLT 185 190   BMET Recent Labs    03/05/17 1524 03/06/17 0528  NA 136 138  K 3.9 3.7  CL 103 107  CO2 24 24  GLUCOSE 105* 88  BUN 29* 25*  CREATININE 1.12 1.04  CALCIUM 8.9 8.1*   PT/INR No results for input(s): LABPROT, INR in the last 72 hours. ABG No results for input(s): PHART, HCO3 in the last 72 hours.  Invalid input(s): PCO2, PO2  MEDS, Scheduled . chlorhexidine  15 mL Mouth Rinse BID  . heparin  5,000 Units Subcutaneous Q8H  . losartan  25 mg Oral Daily  . mouth rinse  15 mL Mouth Rinse q12n4p  . metoprolol succinate  25 mg Oral BID  . niacin  500 mg Oral QPM  . pantoprazole  40 mg Oral Daily  . [START ON 03/07/2017] pneumococcal 23 valent vaccine  0.5 mL Intramuscular Tomorrow-1000  . simvastatin  20 mg Oral QPM    Studies/Results: Ct Abdomen Pelvis W Contrast  Result Date: 03/05/2017 CLINICAL DATA:  Hernia repair on Thursday. Vomiting with eating. No bowel movement. Possible ileus. JP drain change yesterday. Per clinical data on previous CT report, history of rectal carcinoma and  prostate carcinoma. EXAM: CT ABDOMEN AND PELVIS WITH CONTRAST TECHNIQUE: Multidetector CT imaging of the abdomen and pelvis was performed using the standard protocol following bolus administration of intravenous contrast. CONTRAST:  127mL ISOVUE-300 IOPAMIDOL (ISOVUE-300) INJECTION 61% COMPARISON:  CT abdomen dated 02/03/2017. FINDINGS: Lower chest: Mild scarring/atelectasis at each lung base. Hepatobiliary: Small layering stones within the otherwise normal-appearing gallbladder. No focal liver abnormality is seen. No bile duct dilatation. Pancreas: Unremarkable. No pancreatic ductal dilatation or surrounding inflammatory changes. Spleen: Normal in size without focal abnormality. Adrenals/Urinary Tract: Adrenal glands appear normal. Bilateral renal cysts. No renal stone or hydronephrosis bilaterally. No ureteral or bladder calculi identified. Surgical changes of cystectomy and ileal conduit creation. No evidence of obstruction or inflammation at the conduit. Stomach/Bowel: No obstruction or inflammation at the left lower quadrant colostomy site. Large bowel otherwise normal in caliber and configuration. No colonic wall thickening or evidence of bowel wall inflammation. There is mild small bowel dilatation, with associated air-fluid levels, at and proximal to the anterior abdominal wall hernia. There are relatively decompressed small bowel loops distal to the anterior abdominal wall hernia site raising the possibility of a partial small bowel obstruction within the hernia. There is, however, a wide opening to the hernia sac and no obvious septations or loculations within the hernia sac itself. Vascular/Lymphatic: Aortic atherosclerosis. No enlarged abdominal or pelvic lymph nodes. Reproductive: Status post prostatectomy.  Brachy therapy seeds within the lower pelvis. Other: No free fluid or abscess collection identified. No free intraperitoneal air. Drainage catheter enters from the right lower abdominal wall and  extends to the anterior margin of the ventral abdominal wall hernia site. Musculoskeletal: Degenerative spurring throughout the slightly scoliotic thoracolumbar spine, mild to moderate in degree. No acute or suspicious osseous finding. IMPRESSION: 1. Mildly distended small bowel loops throughout the abdomen and pelvis, with associated air-fluid levels, consistent with ileus versus partial small bowel obstruction. There are relatively decompressed small bowel loops distal to the anterior abdominal wall hernia site increasing my suspicion for partial small bowel obstruction within or adjacent to the hernia site, rather than ileus, and there is a questionable associated transition zone at the left inferolateral margin of the hernia sac. 2. Foci of air at the left inferolateral margin of the hernia sac which are of uncertain relationship to the adjacent bowel loops, possibly extraluminal air. Viscus perforation cannot be excluded, however, the air is more likely either within adjacent bowel or related to the adjacent percutaneous drainage catheter which terminates at the anterior margin of the ventral abdominal wall hernia sac. 3. Thickening of the walls of the small bowel within the hernia sac, likely reactive in nature, but differential would also include bowel ischemia and infectious enteritis. 4. No obstruction or inflammation at the left lower quadrant colostomy site. 5. Cholelithiasis without evidence of acute cholecystitis. 6. Aortic atherosclerosis. These results were called by telephone at the time of interpretation on 03/05/2017 at 5:42 pm to Dr. Jeannett Senior , who verbally acknowledged these results. Electronically Signed   By: Franki Cabot M.D.   On: 03/05/2017 17:45    Assessment: s/p Procedure(s): LAPAROSCOPIC INCISONAL  HERNIA REPAIR WITH MESH Patient Active Problem List   Diagnosis Date Noted  . SBO (small bowel obstruction) ([place]) 03/05/2017  . Incisional hernia of anterior abdominal wall  without obstruction or gangrene 03/02/2017  . Pulmonary nodules/lesions, multiple 10/17/2014  . Bilateral renal masses 10/17/2014  . Rectal cancer ([place]) 10/07/2014  . Hypertension 11/17/2011  . High cholesterol 11/17/2011  . Prostate cancer ([place]) 11/17/2011  . Colon polyps 11/17/2011    Post op evisceration after vomiting.    Plan: Return to OR today for abd wall closure  Risks include bleeding, ostomy malfunction, infection, damage to adjacent structures and recurrence.  I believe he understands this and has agreed to proceed.    LOS: 1 day     .Rosario Adie, [facility], [place]   03/06/2017 10:38 AM

## 2017-03-07 ENCOUNTER — Encounter (HOSPITAL_COMMUNITY): Payer: Self-pay | Admitting: General Surgery

## 2017-03-07 NOTE — Progress Notes (Signed)
1 Day Post-Op hernia repair Subjective: Pain controlled, ng functioning, no complaints  Objective: Vital signs in last 24 hours: Temp:  [97.5 F (36.4 C)-99.4 F (37.4 C)] 98.1 F (36.7 C) (02/18 0645) Pulse Rate:  [67-115] 67 (02/18 0645) Resp:  [13-22] 14 (02/18 0645) BP: (126-184)/(55-78) 133/63 (02/18 0645) SpO2:  [95 %-99 %] 97 % (02/18 0645)   Intake/Output from previous day: 02/17 0701 - 02/18 0700 In: 3713.5 [I.V.:3228.9; IV Piggyback:484.6] Out: 1201 [Urine:925; Emesis/NG output:210; Drains:40; Stool:1; Blood:25] Intake/Output this shift: No intake/output data recorded.   General appearance: alert and cooperative GI: soft, mildly distended  Incision: no significant drainage  Lab Results:  Recent Labs    03/05/17 1524 03/06/17 0528  WBC 13.6* 10.7*  HGB 14.5 13.3  HCT 41.8 39.3  PLT 185 190   BMET Recent Labs    03/05/17 1524 03/06/17 0528  NA 136 138  K 3.9 3.7  CL 103 107  CO2 24 24  GLUCOSE 105* 88  BUN 29* 25*  CREATININE 1.12 1.04  CALCIUM 8.9 8.1*   PT/INR No results for input(s): LABPROT, INR in the last 72 hours. ABG No results for input(s): PHART, HCO3 in the last 72 hours.  Invalid input(s): PCO2, PO2  MEDS, Scheduled . heparin  5,000 Units Subcutaneous Q8H  . losartan  25 mg Oral Daily  . mouth rinse  15 mL Mouth Rinse q12n4p  . metoprolol succinate  25 mg Oral BID  . niacin  500 mg Oral QPM  . pantoprazole  40 mg Oral Daily  . pneumococcal 23 valent vaccine  0.5 mL Intramuscular Tomorrow-1000  . simvastatin  20 mg Oral QPM    Studies/Results: Ct Abdomen Pelvis W Contrast  Result Date: 03/05/2017 CLINICAL DATA:  Hernia repair on Thursday. Vomiting with eating. No bowel movement. Possible ileus. JP drain change yesterday. Per clinical data on previous CT report, history of rectal carcinoma and prostate carcinoma. EXAM: CT ABDOMEN AND PELVIS WITH CONTRAST TECHNIQUE: Multidetector CT imaging of the abdomen and pelvis was  performed using the standard protocol following bolus administration of intravenous contrast. CONTRAST:  168mL ISOVUE-300 IOPAMIDOL (ISOVUE-300) INJECTION 61% COMPARISON:  CT abdomen dated 02/03/2017. FINDINGS: Lower chest: Mild scarring/atelectasis at each lung base. Hepatobiliary: Small layering stones within the otherwise normal-appearing gallbladder. No focal liver abnormality is seen. No bile duct dilatation. Pancreas: Unremarkable. No pancreatic ductal dilatation or surrounding inflammatory changes. Spleen: Normal in size without focal abnormality. Adrenals/Urinary Tract: Adrenal glands appear normal. Bilateral renal cysts. No renal stone or hydronephrosis bilaterally. No ureteral or bladder calculi identified. Surgical changes of cystectomy and ileal conduit creation. No evidence of obstruction or inflammation at the conduit. Stomach/Bowel: No obstruction or inflammation at the left lower quadrant colostomy site. Large bowel otherwise normal in caliber and configuration. No colonic wall thickening or evidence of bowel wall inflammation. There is mild small bowel dilatation, with associated air-fluid levels, at and proximal to the anterior abdominal wall hernia. There are relatively decompressed small bowel loops distal to the anterior abdominal wall hernia site raising the possibility of a partial small bowel obstruction within the hernia. There is, however, a wide opening to the hernia sac and no obvious septations or loculations within the hernia sac itself. Vascular/Lymphatic: Aortic atherosclerosis. No enlarged abdominal or pelvic lymph nodes. Reproductive: Status post prostatectomy. Brachy therapy seeds within the lower pelvis. Other: No free fluid or abscess collection identified. No free intraperitoneal air. Drainage catheter enters from the right lower abdominal wall and extends to the  anterior margin of the ventral abdominal wall hernia site. Musculoskeletal: Degenerative spurring throughout the  slightly scoliotic thoracolumbar spine, mild to moderate in degree. No acute or suspicious osseous finding. IMPRESSION: 1. Mildly distended small bowel loops throughout the abdomen and pelvis, with associated air-fluid levels, consistent with ileus versus partial small bowel obstruction. There are relatively decompressed small bowel loops distal to the anterior abdominal wall hernia site increasing my suspicion for partial small bowel obstruction within or adjacent to the hernia site, rather than ileus, and there is a questionable associated transition zone at the left inferolateral margin of the hernia sac. 2. Foci of air at the left inferolateral margin of the hernia sac which are of uncertain relationship to the adjacent bowel loops, possibly extraluminal air. Viscus perforation cannot be excluded, however, the air is more likely either within adjacent bowel or related to the adjacent percutaneous drainage catheter which terminates at the anterior margin of the ventral abdominal wall hernia sac. 3. Thickening of the walls of the small bowel within the hernia sac, likely reactive in nature, but differential would also include bowel ischemia and infectious enteritis. 4. No obstruction or inflammation at the left lower quadrant colostomy site. 5. Cholelithiasis without evidence of acute cholecystitis. 6. Aortic atherosclerosis. These results were called by telephone at the time of interpretation on 03/05/2017 at 5:42 pm to Dr. Jeannett Senior , who verbally acknowledged these results. Electronically Signed   By: Franki Cabot M.D.   On: 03/05/2017 17:45   Dg Abd Portable 1v  Result Date: 03/06/2017 CLINICAL DATA:  NG tube placement.  Hernia repair 4 days ago. EXAM: PORTABLE ABDOMEN - 1 VIEW COMPARISON:  CT 03/05/2017 FINDINGS: Exam demonstrates a nasogastric tube with tip and side-port over the stomach in the left upper quadrant. There are multiple air-filled mildly dilated small bowel loops measuring up to  4.1 cm in diameter as demonstrated on recent CT. No definite free peritoneal air. Degenerate change of the spine. IMPRESSION: Air-filled dilated small bowel loops as described on recent CT which may be due to postoperative ileus versus early/partial small bowel obstruction. Nasogastric tube with tip and side-port over the stomach in the left upper quadrant. Electronically Signed   By: Marin Olp M.D.   On: 03/06/2017 10:47    Assessment: s/p Procedure(s): REOPENING OF RECENT LAPAROTOMY WITH CLOSURE OF INCISIONAL HERNIA WITH MESH Patient Active Problem List   Diagnosis Date Noted  . SBO (small bowel obstruction) ([place]) 03/05/2017  . Incisional hernia of anterior abdominal wall without obstruction or gangrene 03/02/2017  . Pulmonary nodules/lesions, multiple 10/17/2014  . Bilateral renal masses 10/17/2014  . Rectal cancer ([place]) 10/07/2014  . Hypertension 11/17/2011  . High cholesterol 11/17/2011  . Prostate cancer ([place]) 11/17/2011  . Colon polyps 11/17/2011    Expected post op course  Plan: await return of bowel function  Ambulate with binder in place   LOS: 2 days     .Rosario Adie, [facility], [place]   03/07/2017 7:56 AM

## 2017-03-08 NOTE — Care Management Important Message (Signed)
Important Message  Patient Details  Name: Ian Christensen MRN: [id number] Date of Birth: [birth year]   Medicare Important Message Given:  Yes    Kerin Salen 03/08/2017, 10:33 AMImportant Message  Patient Details  Name: Ian Christensen MRN: [id number] Date of Birth: [birth year]   Medicare Important Message Given:  Yes    Kerin Salen 03/08/2017, 10:33 AM

## 2017-03-08 NOTE — Consult Note (Signed)
[facility] Nurse ostomy consult note: Urostomy Stoma type/location: RLQ ileal conduit Stomal assessment/size: not measured today Peristomal assessment:Not seen today Treatment options for stomal/peristomal skin: Skin barrier ring Output: gold urine. PAtient is NPO Ostomy pouching: 1pc./2pc.  Education provided: None Enrolled patient in [facility] program: Yes, previously. Will plan for pouch change tomorrow.   [facility] Nurse ostomy consult note: Colostomy Stoma type/location: LLQ colostomy Stomal assessment/size: 1 inch round, red, moist Peristomal assessment: Not seen today Treatment options for stomal/peristomal skin: skin barrier ring Output: None Ostomy pouching: 2pc.  Education provided: Patient and this Probation officer walked around unit.  He is in the rocking chair to promote motility and prevent DVT. I have provided him with a chair pad for his comfort. Enrolled patient in [facility] program: Yes, previously. Will plan for pouch change tomorrow.   [facility] nursing team will follow, and will remain available to this patient, the nursing and medical teams.   Thanks, Maudie Flakes, MSN, RN, [place], Arther Abbott  Pager# [phone]

## 2017-03-08 NOTE — Progress Notes (Signed)
2 Days Post-Op hernia repair Subjective: Pain controlled, ng functioning, no complaints, having a hard time getting out of bed  Objective: Vital signs in last 24 hours: Temp:  [97.9 F (36.6 C)-98.3 F (36.8 C)] 97.9 F (36.6 C) (02/19 0500) Pulse Rate:  [66-76] 74 (02/19 0500) Resp:  [16-18] 16 (02/19 0500) BP: (136-167)/(61-82) 147/66 (02/19 0500) SpO2:  [94 %-98 %] 94 % (02/19 0500)   Intake/Output from previous day: 02/18 0701 - 02/19 0700 In: 1595 [P.O.:20; I.V.:1575] Out: 2979 [Urine:1350; Emesis/NG output:325; Drains:15] Intake/Output this shift: No intake/output data recorded.   General appearance: alert and cooperative GI: soft, mildly distended  Incision: no significant drainage  Lab Results:  Recent Labs    03/05/17 1524 03/06/17 0528  WBC 13.6* 10.7*  HGB 14.5 13.3  HCT 41.8 39.3  PLT 185 190   BMET Recent Labs    03/05/17 1524 03/06/17 0528  NA 136 138  K 3.9 3.7  CL 103 107  CO2 24 24  GLUCOSE 105* 88  BUN 29* 25*  CREATININE 1.12 1.04  CALCIUM 8.9 8.1*   PT/INR No results for input(s): LABPROT, INR in the last 72 hours. ABG No results for input(s): PHART, HCO3 in the last 72 hours.  Invalid input(s): PCO2, PO2  MEDS, Scheduled . heparin  5,000 Units Subcutaneous Q8H  . losartan  25 mg Oral Daily  . mouth rinse  15 mL Mouth Rinse q12n4p  . metoprolol succinate  25 mg Oral BID  . niacin  500 mg Oral QPM  . pantoprazole  40 mg Oral Daily  . pneumococcal 23 valent vaccine  0.5 mL Intramuscular Tomorrow-1000  . simvastatin  20 mg Oral QPM    Studies/Results: Dg Abd Portable 1v  Result Date: 03/06/2017 CLINICAL DATA:  NG tube placement.  Hernia repair 4 days ago. EXAM: PORTABLE ABDOMEN - 1 VIEW COMPARISON:  CT 03/05/2017 FINDINGS: Exam demonstrates a nasogastric tube with tip and side-port over the stomach in the left upper quadrant. There are multiple air-filled mildly dilated small bowel loops measuring up to 4.1 cm in diameter as  demonstrated on recent CT. No definite free peritoneal air. Degenerate change of the spine. IMPRESSION: Air-filled dilated small bowel loops as described on recent CT which may be due to postoperative ileus versus early/partial small bowel obstruction. Nasogastric tube with tip and side-port over the stomach in the left upper quadrant. Electronically Signed   By: Marin Olp M.D.   On: 03/06/2017 10:47    Assessment: s/p Procedure(s): REOPENING OF RECENT LAPAROTOMY WITH CLOSURE OF INCISIONAL HERNIA WITH MESH Patient Active Problem List   Diagnosis Date Noted  . SBO (small bowel obstruction) ([place]) 03/05/2017  . Incisional hernia of anterior abdominal wall without obstruction or gangrene 03/02/2017  . Pulmonary nodules/lesions, multiple 10/17/2014  . Bilateral renal masses 10/17/2014  . Rectal cancer ([facility]) 10/07/2014  . Hypertension 11/17/2011  . High cholesterol 11/17/2011  . Prostate cancer ([facility]) 11/17/2011  . Colon polyps 11/17/2011    Expected post op course  Plan: await return of bowel function  Ambulate with binder in place NG with min output and hindering ambulation.  Will d/c and cont npo til bowel function   LOS: 3 days     .Rosario Adie, [facility], [place] [phone]   03/08/2017 7:59 AM

## 2017-03-09 MED ORDER — KCL IN DEXTROSE-NACL 20-5-0.45 MEQ/L-%-% IV SOLN
INTRAVENOUS | Status: DC
  Administered 2017-03-09: 15:00:00 via INTRAVENOUS
  Administered 2017-03-09: 1000 mL via INTRAVENOUS
  Filled 2017-03-09 (×3): qty 1000

## 2017-03-09 MED ORDER — KCL IN DEXTROSE-NACL 20-5-0.45 MEQ/L-%-% IV SOLN: INTRAVENOUS | Status: DC

## 2017-03-09 NOTE — Evaluation (Signed)
Physical Therapy Evaluation Patient Details Name: Ian Christensen MRN: [id number] DOB: 28-Aug-1931 Today's Date: 03/09/2017   History of Present Illness  Pt s/p REOPENING OF RECENT LAPAROTOMY WITH CLOSURE OF INCISIONAL HERNIA WITH MESH (03/06/17)  after having hernia repair 2/13 and complicated by SBO.  Clinical Impression  Pt admitted with above diagnosis. Pt currently with functional limitations due to the deficits listed below (see PT Problem List).  Pt will benefit from skilled PT to increase their independence and safety with mobility to allow discharge to the venue listed below.  Pt ambulated the entire unit with RW, although he normally does not use AD.  He did begin vomiting as PT was about to exit room at end of session.  Recommend HHPT.     Follow Up Recommendations Home health PT;Supervision - Intermittent    Equipment Recommendations  None recommended by PT    Recommendations for Other Services       Precautions / Restrictions Precautions Precautions: Fall Precaution Comments: colostomy, urostomy Required Braces or Orthoses: Other Brace/Splint Other Brace/Splint: abd binder with gait      Mobility  Bed Mobility Overal bed mobility: Needs Assistance Bed Mobility: Rolling;Sidelying to Sit Rolling: Min guard Sidelying to sit: Min guard;HOB elevated       General bed mobility comments: cues for proper form and body mechanics. Donned abd binder in sitting.  Transfers Overall transfer level: Needs assistance Equipment used: Rolling walker (2 wheeled) Transfers: Sit to/from Stand Sit to Stand: Min guard         General transfer comment: cues for hand placement  Ambulation/Gait Ambulation/Gait assistance: Min guard Ambulation Distance (Feet): 350 Feet Assistive device: Rolling walker (2 wheeled) Gait Pattern/deviations: Step-through pattern     General Gait Details: Pt with 3/10 abd pain with gait.  Pt with good cadence and wanted to walk entire lap.     Stairs            Wheelchair Mobility    Modified Rankin (Stroke Patients Only)       Balance Overall balance assessment: Needs assistance   Sitting balance-Leahy Scale: Good       Standing balance-Leahy Scale: Fair                               Pertinent Vitals/Pain Pain Assessment: 0-10 Pain Score: 3  Pain Location: abd Pain Descriptors / Indicators: Sore Pain Intervention(s): Monitored during session    Home Living Family/patient expects to be discharged to:: Private residence Living Arrangements: Alone Available Help at Discharge: Family;Friend(s);Available PRN/intermittently Type of Home: Apartment Home Access: Level entry     Home Layout: One level Home Equipment: Walker - 2 wheels      Prior Function Level of Independence: Independent         Comments: Likes to play cards with his friends     Hand Dominance        Extremity/Trunk Assessment   Upper Extremity Assessment Upper Extremity Assessment: Overall WFL for tasks assessed    Lower Extremity Assessment Lower Extremity Assessment: Overall WFL for tasks assessed       Communication   Communication: No difficulties  Cognition Arousal/Alertness: Awake/alert Behavior During Therapy: WFL for tasks assessed/performed Overall Cognitive Status: Within Functional Limits for tasks assessed  General Comments General comments (skin integrity, edema, etc.): Pt was set up in chair and was starting to eat his popsicle when he began to vomit numerous times a dark brown liquid.  Nursing attending to pt at time of exit.    Exercises     Assessment/Plan    PT Assessment Patient needs continued PT services  PT Problem List Decreased strength;Decreased activity tolerance;Decreased balance;Decreased mobility       PT Treatment Interventions Gait training;Functional mobility training;Balance training;Therapeutic  exercise;Therapeutic activities;Patient/family education;DME instruction    PT Goals (Current goals can be found in the Care Plan section)  Acute Rehab PT Goals Patient Stated Goal: feel better PT Goal Formulation: With patient Time For Goal Achievement: 03/23/17 Potential to Achieve Goals: Good    Frequency Min 3X/week   Barriers to discharge        Co-evaluation               AM-PAC PT "6 Clicks" Daily Activity  Outcome Measure Difficulty turning over in bed (including adjusting bedclothes, sheets and blankets)?: A Lot Difficulty moving from lying on back to sitting on the side of the bed? : A Lot Difficulty sitting down on and standing up from a chair with arms (e.g., wheelchair, bedside commode, etc,.)?: A Little Help needed moving to and from a bed to chair (including a wheelchair)?: A Little Help needed walking in hospital room?: A Little Help needed climbing 3-5 steps with a railing? : A Little 6 Click Score: 16    End of Session Equipment Utilized During Treatment: Other (comment)(abd binder) Activity Tolerance: Patient tolerated treatment well;Other (comment)(vomiting at end of session) Patient left: in chair;with nursing/sitter in room Nurse Communication: Mobility status PT Visit Diagnosis: Difficulty in walking, not elsewhere classified (R26.2)    Time: 0102-7253 PT Time Calculation (min) (ACUTE ONLY): 47 min   Charges:   PT Evaluation $PT Eval Moderate Complexity: 1 Mod PT Treatments $Gait Training: 8-22 mins $Therapeutic Activity: 8-22 mins   PT G Codes:        Ian Christensen, [place] Pager [phone] 03/09/2017   Ian Christensen 03/09/2017, 1:31 PM

## 2017-03-09 NOTE — Progress Notes (Signed)
3 Days Post-Op hernia repair Subjective: Pain controlled, having bowel function.  Min ambulation yesterday  Objective: Vital signs in last 24 hours: Temp:  [97.7 F (36.5 C)-98.1 F (36.7 C)] 97.7 F (36.5 C) (02/20 0549) Pulse Rate:  [75-77] 75 (02/20 0549) Resp:  [18] 18 (02/20 0549) BP: (133-143)/(72-73) 143/73 (02/20 0549) SpO2:  [96 %] 96 % (02/20 0549)   Intake/Output from previous day: 02/19 0701 - 02/20 0700 In: 1725 [I.V.:1725] Out: 1510 [Urine:1200; Emesis/NG output:270; Drains:40] Intake/Output this shift: No intake/output data recorded.   General appearance: alert and cooperative GI: soft, mildly distended JP: SS fluid Incision: no significant drainage  Lab Results:  No results for input(s): WBC, HGB, HCT, PLT in the last 72 hours. BMET No results for input(s): NA, K, CL, CO2, GLUCOSE, BUN, CREATININE, CALCIUM in the last 72 hours. PT/INR No results for input(s): LABPROT, INR in the last 72 hours. ABG No results for input(s): PHART, HCO3 in the last 72 hours.  Invalid input(s): PCO2, PO2  MEDS, Scheduled . heparin  5,000 Units Subcutaneous Q8H  . losartan  25 mg Oral Daily  . mouth rinse  15 mL Mouth Rinse q12n4p  . metoprolol succinate  25 mg Oral BID  . niacin  500 mg Oral QPM  . pantoprazole  40 mg Oral Daily  . simvastatin  20 mg Oral QPM    Studies/Results: No results found.  Assessment: s/p Procedure(s): REOPENING OF RECENT LAPAROTOMY WITH CLOSURE OF INCISIONAL HERNIA WITH MESH Patient Active Problem List   Diagnosis Date Noted  . SBO (small bowel obstruction) ([place]) 03/05/2017  . Incisional hernia of anterior abdominal wall without obstruction or gangrene 03/02/2017  . Pulmonary nodules/lesions, multiple 10/17/2014  . Bilateral renal masses 10/17/2014  . Rectal cancer ([place]) 10/07/2014  . Hypertension 11/17/2011  . High cholesterol 11/17/2011  . Prostate cancer ([place]) 11/17/2011  . Colon polyps 11/17/2011    Expected post op  course  Plan: Ambulate with binder in place Having bowel function, will advance diet Change midline dressing Cont JP  LOS: 4 days     .Rosario Adie, MD [facility], [place]   03/09/2017 10:24 AM

## 2017-03-10 NOTE — Consult Note (Signed)
[place] Nurse ostomy consult note: Urostomy  Stoma type/location: RLQ ileal conduit Stomal assessment/size: 1 inch, red, moist and slightly protruding.  OS at center. Peristomal assessment: Intact, clear Treatment options for stomal/peristomal skin: skin barrier ring Output: dark gold urine Ostomy pouching: 1pc.convex urinary pouch with skin barrier ring. Patient is to be disconnected from bedside urinary drainage bag during the day as he is at home.  Education provided: Patient is provided with a pouch that is slightly upgraded from the one I initially placed him in several years ago. The features (ceraPlus skin barrier, softer tap closure and most visible indicator) are demonstrated. He will try this pouch while here and I have provided him with 3 additional pouches to continue a trial at home.  If he likes, he can simply tell his [facility] representative that he would like to "upgrade" since the two pouches are the same price. Enrolled patient in [place] program: Yes, previously  [place] Nurse ostomy consult note Stoma type/location: LLQ Colostomy Stomal assessment/size: 1inch round, red, slightly raised, os at center Peristomal assessment: intact, clear Treatment options for stomal/peristomal skin: skin barrier ring Output: pasty brown stool in a small amount Ostomy pouching: 2pc. 2 and 1/4 inch ostomy pouching system with skin barrier ring Education provided: None required. Enrolled patient in [place] program: Yes, previously   Patient changes his pouches every 5 days (or so) and is independent in his care.  At this time, he is to wear an abdominal support brace when standing, walking or OOB (not in shower) and he should support his abdomen if he feels the need to cough, sneeze or otherwise increase his abdominal pressure.  Patient has two daughters who are very involved in his care, one of whom is a Administrator, sports in our hospital system. He is active  socially, plays cards with friends at least twice weekly about 30 miles from his home and he is still driving.  It is a pleasure to again care for this nice gentleman.  [facility] nursing team will follow, and will remain available to this patient, the nursing, surgical and medical teams.   Thanks, Maudie Flakes, MSN, RN, [place], Arther Abbott  Pager# [phone]

## 2017-03-10 NOTE — Progress Notes (Signed)
Spoke with patient at beside. He is agreeable to HHPT, does not have a preference for agency. Lives in [place] alone, has 2 daughters and some friends who assist as needed. They assist with meals and transportation. Has a PCP, Dr. Gerarda Fraction. Has a RW and shower chair at home, denies any further DME needs. Contacted [facility] for referral, PT evaluate and treat. [phone]

## 2017-03-10 NOTE — Consult Note (Signed)
[facility] CM Inpatient Consult   03/10/2017  Ian Christensen 19-Nov-1931 [id number]   Patient screened for potential [facility] Care Management program services.   Went to bedside to discuss and offer [facility] Care Management services. [name] pleasantly declines [facility] Care Management follow up. He states " I get enough calls as it is". Provided [facility] Care Management brochure and 24-nurse advice line magnet. Appreciative of visit.   Discussed that his Primary Care MD office, Dr. Sherwood Gambler with [facility] is listed as calling their patient post hospital discharge.   Will make inpatient RNCM aware that [name] declined [facility] Care Management program.    Raiford Noble, MSN-Ed, RN,BSN [facility] Liaison [phone]

## 2017-03-10 NOTE — Progress Notes (Signed)
4 Days Post-Op hernia repair Subjective: Pain controlled, having bowel function.  Vomited once.  Tolerated clears since then.  Objective: Vital signs in last 24 hours: Temp:  [97.6 F (36.4 C)-98.4 F (36.9 C)] 98 F (36.7 C) (02/21 0455) Pulse Rate:  [73-87] 81 (02/21 0455) Resp:  [18] 18 (02/21 0455) BP: (119-131)/(57-71) 119/71 (02/21 0455) SpO2:  [92 %-95 %] 94 % (02/21 0455)   Intake/Output from previous day: 02/20 0701 - 02/21 0700 In: 1419.2 [P.O.:680; I.V.:739.2] Out: 1910 [Urine:925; Drains:35; Stool:950] Intake/Output this shift: No intake/output data recorded.  General appearance: alert and cooperative GI: soft, mildly distended JP: SS fluid Incision: no significant drainage  Lab Results:  No results for input(s): WBC, HGB, HCT, PLT in the last 72 hours. BMET No results for input(s): NA, K, CL, CO2, GLUCOSE, BUN, CREATININE, CALCIUM in the last 72 hours. PT/INR No results for input(s): LABPROT, INR in the last 72 hours. ABG No results for input(s): PHART, HCO3 in the last 72 hours.  Invalid input(s): PCO2, PO2  MEDS, Scheduled . heparin  5,000 Units Subcutaneous Q8H  . losartan  25 mg Oral Daily  . mouth rinse  15 mL Mouth Rinse q12n4p  . metoprolol succinate  25 mg Oral BID  . niacin  500 mg Oral QPM  . pantoprazole  40 mg Oral Daily  . simvastatin  20 mg Oral QPM    Studies/Results: No results found.  Assessment: s/p Procedure(s): REOPENING OF RECENT LAPAROTOMY WITH CLOSURE OF INCISIONAL HERNIA WITH MESH Patient Active Problem List   Diagnosis Date Noted  . SBO (small bowel obstruction) ([place]) 03/05/2017  . Incisional hernia of anterior abdominal wall without obstruction or gangrene 03/02/2017  . Pulmonary nodules/lesions, multiple 10/17/2014  . Bilateral renal masses 10/17/2014  . Rectal cancer ([place]) 10/07/2014  . Hypertension 11/17/2011  . High cholesterol 11/17/2011  . Prostate cancer ([place]) 11/17/2011  . Colon polyps 11/17/2011     Expected post op course  Plan: Ambulate with binder in place Having bowel function but still was vomiting yesterday.  Will cont with clears for now Min IVF's Cont JP    LOS: 5 days     .Rosario Adie, [facility], [place] [phone]   03/10/2017 9:44 AM

## 2017-03-11 MED ORDER — ENSURE ENLIVE PO LIQD
237.0000 mL | Freq: Two times a day (BID) | ORAL | Status: DC
  Administered 2017-03-11 – 2017-03-13 (×5): 237 mL via ORAL

## 2017-03-11 MED ORDER — JUVEN PO PACK
1.0000 | PACK | Freq: Two times a day (BID) | ORAL | Status: DC
  Administered 2017-03-11 – 2017-03-12 (×3): 1 via ORAL
  Filled 2017-03-11 (×5): qty 1

## 2017-03-11 NOTE — Progress Notes (Signed)
5 Days Post-Op hernia repair Subjective: Pain controlled, having bowel function.  Tolerating clears.  Objective: Vital signs in last 24 hours: Temp:  [98.7 F (37.1 C)-98.9 F (37.2 C)] 98.7 F (37.1 C) (02/22 0622) Pulse Rate:  [72] 72 (02/22 0622) Resp:  [18] 18 (02/22 0622) BP: (118-119)/(58-62) 118/62 (02/22 0622) SpO2:  [95 %-96 %] 96 % (02/22 0622)   Intake/Output from previous day: 02/21 0701 - 02/22 0700 In: 560 [P.O.:440; I.V.:120] Out: 972 [Urine:850; Drains:22; Stool:100] Intake/Output this shift: No intake/output data recorded.  General appearance: alert and cooperative GI: soft, mildly distended JP: SS fluid Incision: no significant drainage  Lab Results:  No results for input(s): WBC, HGB, HCT, PLT in the last 72 hours. BMET No results for input(s): NA, K, CL, CO2, GLUCOSE, BUN, CREATININE, CALCIUM in the last 72 hours. PT/INR No results for input(s): LABPROT, INR in the last 72 hours. ABG No results for input(s): PHART, HCO3 in the last 72 hours.  Invalid input(s): PCO2, PO2  MEDS, Scheduled . feeding supplement (ENSURE ENLIVE)  237 mL Oral BID BM  . heparin  5,000 Units Subcutaneous Q8H  . losartan  25 mg Oral Daily  . mouth rinse  15 mL Mouth Rinse q12n4p  . metoprolol succinate  25 mg Oral BID  . niacin  500 mg Oral QPM  . pantoprazole  40 mg Oral Daily  . simvastatin  20 mg Oral QPM    Studies/Results: No results found.  Assessment: s/p Procedure(s): REOPENING OF RECENT LAPAROTOMY WITH CLOSURE OF INCISIONAL HERNIA WITH MESH Patient Active Problem List   Diagnosis Date Noted  . SBO (small bowel obstruction) ([place]) 03/05/2017  . Incisional hernia of anterior abdominal wall without obstruction or gangrene 03/02/2017  . Pulmonary nodules/lesions, multiple 10/17/2014  . Bilateral renal masses 10/17/2014  . Rectal cancer ([place]) 10/07/2014  . Hypertension 11/17/2011  . High cholesterol 11/17/2011  . Prostate cancer ([place]) 11/17/2011  . Colon  polyps 11/17/2011    Expected post op course  Plan: Ambulate with binder in place Having bowel function. Advance to fulls Min IVF's Cont JP    LOS: 6 days     .Rosario Adie, [facility], [place] [phone]   03/11/2017 8:19 AM

## 2017-03-11 NOTE — Progress Notes (Signed)
Nutrition Brief Note  RD to pt's room to discuss oral nutrition supplementation per family member request. Pt resting and drinking an Ensure Enlive oral nutrition supplement at time of visit. Discussed benefits of Juven with pt who agreed to receiving BID.  Intervention:  - 1 packet Juven BID, each packet provides 80 calories, 8 grams of carbohydrate, and 14 grams of amino acids; supplement contains CaHMB, glutamine, and arginine, to promote wound healing.  Please provide Juven with water.  Current diet order is Full Liquid. No meal completion data available in chart. Labs and medications reviewed.   Gaynell Face, MS, RD, LDN Pager: [phone] Weekend/After Hours: [phone]

## 2017-03-11 NOTE — Discharge Instructions (Signed)
GENERAL SURGERY: POST OP INSTRUCTIONS  1. DIET: Follow a light bland diet the first 24 hours after arrival home, such as soup, liquids, crackers, etc.  Be sure to include lots of fluids daily.  Avoid fast food or heavy meals as your are more likely to get nauseated.   2. Take your usually prescribed home medications unless otherwise directed. 3. PAIN CONTROL: a. Pain is best controlled by a usual combination of three different methods TOGETHER: i. Ice/Heat ii. Over the counter pain medication iii. Prescription pain medication b. Most patients will experience some swelling and bruising around the incisions.  Ice packs or heating pads (30-60 minutes up to 6 times a day) will help. Use ice for the first few days to help decrease swelling and bruising, then switch to heat to help relax tight/sore spots and speed recovery.  Some people prefer to use ice alone, heat alone, alternating between ice & heat.  Experiment to what works for you.  Swelling and bruising can take several weeks to resolve.   c. It is helpful to take an over-the-counter pain medication regularly for the first few weeks.  Choose one of the following that works best for you: i. Naproxen (Aleve, etc)  Two 220mg  tabs twice a day ii. Ibuprofen (Advil, etc) Three 200mg  tabs four times a day (every meal & bedtime) d. A  prescription for pain medication (such as Percocet, oxycodone, hydrocodone, etc) should be given to you upon discharge.  Take your pain medication as prescribed.  i. If you are having problems/concerns with the prescription medicine (does not control pain, nausea, vomiting, rash, itching, etc), please call us [phone] to see if we need to switch you to a different pain medicine that will work better for you and/or control your side effect better. ii. If you need a refill on your pain medication, please contact your pharmacy.  They will contact our office to request authorization. Prescriptions will not be filled after 5  pm or on week-ends. 4. Avoid getting constipated.  Between the surgery and the pain medications, it is common to experience some constipation.  Increasing fluid intake and taking a fiber supplement (such as Metamucil, Citrucel, FiberCon, MiraLax, etc) 1-2 times a day regularly will usually help prevent this problem from occurring.  A mild laxative (prune juice, Milk of Magnesia, MiraLax, etc) should be taken according to package directions if there are no bowel movements after 48 hours.   5. Wash / shower every day.  You may shower over the dressings as they are waterproof.  Continue to shower over incision(s) after the dressing is off. 6. Change dressing daily.  7. ACTIVITIES as tolerated:   a. You may resume regular (light) daily activities beginning the next day--such as daily self-care, walking, climbing stairs--gradually increasing activities as tolerated.  If you can walk 30 minutes without difficulty, it is safe to try more intense activity such as jogging, treadmill, bicycling, low-impact aerobics, swimming, etc. b. Save the most intensive and strenuous activity for last such as sit-ups, heavy lifting, contact sports, etc  Refrain from any heavy lifting or straining until you are off narcotics for pain control.   c. DO NOT PUSH THROUGH PAIN.  Let pain be your guide: If it hurts to do something, don't do it.  Pain is your body warning you to avoid that activity for another week until the pain goes down. d. You may drive when you are no longer taking prescription pain medication, you can comfortably wear  a seatbelt, and you can safely maneuver your car and apply brakes. e. Dennis Bast may have sexual intercourse when it is comfortable.  8. FOLLOW UP in our office a. Please call [facility] at [phone] to set up an appointment to see your surgeon in the office for a follow-up appointment approximately 2-3 weeks after your surgery. b. Make sure that you call for this appointment the day you arrive home to  insure a convenient appointment time. 9. IF YOU HAVE DISABILITY OR FAMILY LEAVE FORMS, BRING THEM TO THE OFFICE FOR PROCESSING.  DO NOT GIVE THEM TO YOUR DOCTOR.   WHEN TO CALL us [phone]: 1. Poor pain control 2. Reactions / problems with new medications (rash/itching, nausea, etc)  3. Fever over 101.5 F (38.5 C) 4. Worsening swelling or bruising 5. Continued bleeding from incision. 6. Increased pain, redness, or drainage from the incision   The clinic staff is available to answer your questions during regular business hours (8:30am-5pm).  Please dont hesitate to call and ask to speak to one of our nurses for clinical concerns.   If you have a medical emergency, go to the nearest emergency room or call 911.  A surgeon from [facility] is always on call at the [facility], [place], [place], [place], [place]  [address] ? MAIN: [phone] ? TOLL FREE: [phone] ?  FAX [phone] [email or web address]

## 2017-03-11 NOTE — Progress Notes (Signed)
Physical Therapy Treatment Patient Details Name: Ian Christensen MRN: [id number] DOB: May 12, 1931 Today's Date: 03/11/2017    History of Present Illness Pt s/p REOPENING OF RECENT LAPAROTOMY WITH CLOSURE OF INCISIONAL HERNIA WITH MESH (03/06/17)  after having hernia repair 2/13 and complicated by SBO.    PT Comments    Pt very motivated and progressing well with his mobility.  Assisted OOB to amb a greater distance in hallway with walker for safety but is able to amb short distances without.    Follow Up Recommendations  Home health PT;Supervision - Intermittent     Equipment Recommendations  None recommended by PT    Recommendations for Other Services       Precautions / Restrictions Precautions Precautions: Fall Precaution Comments: colostomy, urostomy Required Braces or Orthoses: Other Brace/Splint Other Brace/Splint: abd binder with gait Restrictions Weight Bearing Restrictions: No    Mobility  Bed Mobility Overal bed mobility: Needs Assistance Bed Mobility: Rolling;Sidelying to Sit Rolling: Min guard;Supervision Sidelying to sit: Min guard;HOB elevated;Supervision       General bed mobility comments: HOB elevated and use of rail   Transfers Overall transfer level: Needs assistance Equipment used: Rolling walker (2 wheeled) Transfers: Sit to/from Stand Sit to Stand: Min guard;Supervision         General transfer comment: cues for hand placement and increased time  Ambulation/Gait Ambulation/Gait assistance: Supervision;Min guard Ambulation Distance (Feet): 750 Feet(2 laps around unit) Assistive device: Rolling walker (2 wheeled) Gait Pattern/deviations: Step-through pattern Gait velocity: WFL   General Gait Details: tolerated an increased distance still using walker for mild gait instability but was able to amb short distance in room without.    Stairs            Wheelchair Mobility    Modified Rankin (Stroke Patients Only)       Balance                                             Cognition Arousal/Alertness: Awake/alert Behavior During Therapy: WFL for tasks assessed/performed Overall Cognitive Status: Within Functional Limits for tasks assessed                                        Exercises      General Comments        Pertinent Vitals/Pain Pain Assessment: Faces Faces Pain Scale: Hurts a little bit Pain Location: abd Pain Descriptors / Indicators: Sore Pain Intervention(s): Monitored during session    Home Living                      Prior Function            PT Goals (current goals can now be found in the care plan section) Progress towards PT goals: Progressing toward goals    Frequency    Min 3X/week      PT Plan Current plan remains appropriate    Co-evaluation              AM-PAC PT "6 Clicks" Daily Activity  Outcome Measure  Difficulty turning over in bed (including adjusting bedclothes, sheets and blankets)?: A Lot Difficulty moving from lying on back to sitting on the side of the bed? : A Lot Difficulty sitting down on and  standing up from a chair with arms (e.g., wheelchair, bedside commode, etc,.)?: A Little Help needed moving to and from a bed to chair (including a wheelchair)?: A Little Help needed walking in hospital room?: A Little Help needed climbing 3-5 steps with a railing? : A Little 6 Click Score: 16    End of Session Equipment Utilized During Treatment: Gait belt Activity Tolerance: Patient tolerated treatment well;Other (comment) Patient left: in chair;with nursing/sitter in room Nurse Communication: Mobility status PT Visit Diagnosis: Difficulty in walking, not elsewhere classified (R26.2)     Time: 1235-1300 PT Time Calculation (min) (ACUTE ONLY): 25 min  Charges:  $Gait Training: 8-22 mins $Therapeutic Activity: 8-22 mins                    G Codes:       Felecia Shelling  PTA [facility]  Acute  Rehab Pager       [phone]

## 2017-03-12 MED ORDER — ACETAMINOPHEN 325 MG PO TABS
650.0000 mg | ORAL_TABLET | Freq: Four times a day (QID) | ORAL | Status: DC | PRN
  Administered 2017-03-12 – 2017-03-13 (×3): 650 mg via ORAL
  Filled 2017-03-12 (×3): qty 2

## 2017-03-12 NOTE — Progress Notes (Signed)
6 Days Post-Op   Subjective/Chief Complaint: Doing well Bowels moving    Objective: Vital signs in last 24 hours: Temp:  [98.6 F (37 C)-98.9 F (37.2 C)] 98.6 F (37 C) (02/23 0815) Pulse Rate:  [75-88] 88 (02/23 0815) Resp:  [16-19] 16 (02/23 0437) BP: (100-126)/(48-59) 107/56 (02/23 0815) SpO2:  [92 %-95 %] 93 % (02/23 0815) Last BM Date: 03/11/17  Intake/Output from previous day: 02/22 0701 - 02/23 0700 In: 1320 [P.O.:1080; I.V.:240] Out: 1217 [Urine:1050; Drains:17; Stool:150] Intake/Output this shift: No intake/output data recorded.  Abdomen soft serous drainage on dressing   Wound clean JP serous   Lab Results:  No results for input(s): WBC, HGB, HCT, PLT in the last 72 hours. BMET No results for input(s): NA, K, CL, CO2, GLUCOSE, BUN, CREATININE, CALCIUM in the last 72 hours. PT/INR No results for input(s): LABPROT, INR in the last 72 hours. ABG No results for input(s): PHART, HCO3 in the last 72 hours.  Invalid input(s): PCO2, PO2  Studies/Results: No results found.  Anti-infectives: Anti-infectives (From admission, onward)   Start     Dose/Rate Route Frequency Ordered Stop   03/06/17 0600  cefTRIAXone (ROCEPHIN) 2 g in sodium chloride 0.9 % 100 mL IVPB  Status:  Discontinued     2 g 200 mL/hr over 30 Minutes Intravenous On call to O.R. 03/05/17 2058 03/06/17 1521   03/06/17 0600  metroNIDAZOLE (FLAGYL) IVPB 500 mg  Status:  Discontinued     500 mg 100 mL/hr over 60 Minutes Intravenous Every 8 hours 03/05/17 2058 03/06/17 1521      Assessment/Plan: s/p Procedure(s): REOPENING OF RECENT LAPAROTOMY WITH CLOSURE OF INCISIONAL HERNIA WITH MESH (N/A) SL IV Doing well Advance diet Home Sunday   LOS: 7 days    Ian Christensen 03/12/2017

## 2017-03-13 MED ORDER — ONDANSETRON 4 MG PO TBDP: 4.0000 mg | ORAL_TABLET | Freq: Four times a day (QID) | ORAL | 0 refills | Status: DC | PRN

## 2017-03-13 NOTE — Progress Notes (Signed)
7 Days Post-Op   Subjective/Chief Complaint: Tolerating diet good pain control ostomy has function   Objective: Vital signs in last 24 hours: Temp:  [98 F (36.7 C)-98.5 F (36.9 C)] 98.5 F (36.9 C) (02/24 0603) Pulse Rate:  [75-84] 77 (02/24 0603) Resp:  [16-17] 17 (02/24 0603) BP: (118-137)/(54-68) 118/54 (02/24 0603) SpO2:  [94 %-98 %] 94 % (02/24 0603) Last BM Date: 03/12/17  Intake/Output from previous day: 02/23 0701 - 02/24 0700 In: 920 [P.O.:680; I.V.:240] Out: 2543 [Urine:2425; Drains:28; Stool:90] Intake/Output this shift: No intake/output data recorded.  Incision/Wound:CDI non distended soft ostomies viable and functioning   Lab Results:  No results for input(s): WBC, HGB, HCT, PLT in the last 72 hours. BMET No results for input(s): NA, K, CL, CO2, GLUCOSE, BUN, CREATININE, CALCIUM in the last 72 hours. PT/INR No results for input(s): LABPROT, INR in the last 72 hours. ABG No results for input(s): PHART, HCO3 in the last 72 hours.  Invalid input(s): PCO2, PO2  Studies/Results: No results found.  Anti-infectives: Anti-infectives (From admission, onward)   Start     Dose/Rate Route Frequency Ordered Stop   03/06/17 0600  cefTRIAXone (ROCEPHIN) 2 g in sodium chloride 0.9 % 100 mL IVPB  Status:  Discontinued     2 g 200 mL/hr over 30 Minutes Intravenous On call to O.R. 03/05/17 2058 03/06/17 1521   03/06/17 0600  metroNIDAZOLE (FLAGYL) IVPB 500 mg  Status:  Discontinued     500 mg 100 mL/hr over 60 Minutes Intravenous Every 8 hours 03/05/17 2058 03/06/17 1521      Assessment/Plan: s/p Procedure(s): REOPENING OF RECENT LAPAROTOMY WITH CLOSURE OF INCISIONAL HERNIA WITH MESH (N/A) Discharge   LOS: 8 days    Ian Christensen 03/13/2017

## 2017-03-13 NOTE — Discharge Summary (Signed)
Physician Discharge Summary  Patient ID: Ian Christensen MRN: [id number] DOB/AGE: 82/22/1933 82 y.o.  Admit date: 03/05/2017 Discharge date: 03/13/2017  Admission Diagnoses:SBO   Discharge Diagnoses:  Active Problems:   SBO (small bowel obstruction) ([name])   Discharged Condition: good  Hospital Course: see chart  Underwent re exploration 3 days after initial hernia repair.  Had an ileus which resolve over next four days.  His diet was advanced and he ambulated the halls.  He had good pain control and output out both ostomy sites.     Consults: None  Significant Diagnostic Studies: see chart   Treatments: surgery: abdominal hernia repair and re exploration and closure of dehiscence   Discharge Exam: Blood pressure (!) 118/54, pulse 77, temperature 98.5 F (36.9 C), temperature source Oral, resp. rate 17, height 5\' 7"  (1.702 m), weight 87.7 kg (193 lb 5.5 oz), SpO2 94 %. General appearance: alert and cooperative Head: Normocephalic, without obvious abnormality, atraumatic Resp: clear to auscultation bilaterally Cardio: regular rate and rhythm, S1, S2 normal, no murmur, click, rub or gallop Incision/Wound:CDI   Ostomies functioning soft ND BS present   Disposition: 01-Home or Self Care    Follow-up Information    Leighton Ruff, MD. Schedule an appointment as soon as possible for a visit in 2 week(s).   Specialty:  General Surgery Contact information: [address] [place] [address] [phone]           Signed: Turner Daniels 03/13/2017, 9:49 AM

## 2017-03-13 NOTE — Progress Notes (Signed)
Pt admitted on 03/05/17 for surgery on 03/06/17 for abd wall closure.  Pt discharged on 03/13/17.   Pt requested above for insurance purposes

## 2017-03-15 ENCOUNTER — Other Ambulatory Visit: Payer: Self-pay

## 2017-03-15 NOTE — Patient Outreach (Signed)
[facility]) Care Management  03/15/2017  Ian Christensen Jun 17, 1931 [id number]    Telephone Screen  Referral Date: 03/14/17 Referral Source: Urgent [employer] UM Dept. Referral Reason: " Atika met member at bedside, he declined, is now requesting assistance via his insurance salesman specifically colostomy bag questions, possible HH referral" Insurance: [employer]   Outreach attempt # 1 to patient. Spoke with patient and screening completed.    Patient voices that he resides in his home alone. He is fairly independent with ADLs/IADLs. He has supportive dtr that checks on him and assists with his care. Dtr will be taking patient to MD follow up appts. Patient voices that dtr has hired a Music therapist to come in daily to assist. He is unsure of agency name. Patient voices he is not getting [place] services as he does not feel like he needs it. He states that he currently has "two bags" on is stomach along with surgical incision that is bandage. He voices that due to location of bandage he has a hard time seeing it and was unable to change dressing every other day as ordered by MD. He voices that he is dtr is performing wound care. RN CM suggested to patient to consider [facility] RN for wound care management. He voices he does not feel like it is necessary and he goes to see surgeon in 2 weeks and is hopeful "some of this stuff" on his stomach will be removed. He has not made PCP appt. Patient voices he does not see the need. RN CM explained to patient rationale for PCP follow up after hospitalization. He voices he will consider calling office.He denies any issues or concerns regarding his meds. [facility] services reviewed and discussed Patient familiar as he was offered services by hospital liaison while in the hospital. Patient again declined services and does not feel like he needs [facility] support at this time. He already has [facility] info for future reference.     Plan: RN CM will notify [facility] administrative assistant  of case status.   Enzo Montgomery, RN,BSN,CCM [facility] Telephonic Care Management Coordinator Direct Phone: [phone] Toll Free: [phone] Fax: [phone]

## 2017-03-16 DIAGNOSIS — E663 Overweight: Secondary | ICD-10-CM | POA: Diagnosis not present

## 2017-03-16 DIAGNOSIS — L98499 Non-pressure chronic ulcer of skin of other sites with unspecified severity: Secondary | ICD-10-CM | POA: Diagnosis not present

## 2017-03-16 DIAGNOSIS — Z1389 Encounter for screening for other disorder: Secondary | ICD-10-CM | POA: Diagnosis not present

## 2017-03-16 DIAGNOSIS — I10 Essential (primary) hypertension: Secondary | ICD-10-CM | POA: Diagnosis not present

## 2017-03-16 DIAGNOSIS — Z6829 Body mass index (BMI) 29.0-29.9, adult: Secondary | ICD-10-CM | POA: Diagnosis not present

## 2017-04-11 ENCOUNTER — Ambulatory Visit: Payer: PPO | Admitting: Internal Medicine

## 2017-04-19 DIAGNOSIS — Z936 Other artificial openings of urinary tract status: Secondary | ICD-10-CM | POA: Diagnosis not present

## 2017-04-19 DIAGNOSIS — C20 Malignant neoplasm of rectum: Secondary | ICD-10-CM | POA: Diagnosis not present

## 2017-04-19 DIAGNOSIS — Z933 Colostomy status: Secondary | ICD-10-CM | POA: Diagnosis not present

## 2017-05-04 DIAGNOSIS — C61 Malignant neoplasm of prostate: Secondary | ICD-10-CM | POA: Diagnosis not present

## 2017-05-04 DIAGNOSIS — Z6828 Body mass index (BMI) 28.0-28.9, adult: Secondary | ICD-10-CM | POA: Diagnosis not present

## 2017-05-04 DIAGNOSIS — Z1389 Encounter for screening for other disorder: Secondary | ICD-10-CM | POA: Diagnosis not present

## 2017-05-04 DIAGNOSIS — E7849 Other hyperlipidemia: Secondary | ICD-10-CM | POA: Diagnosis not present

## 2017-05-04 DIAGNOSIS — I10 Essential (primary) hypertension: Secondary | ICD-10-CM | POA: Diagnosis not present

## 2017-05-04 DIAGNOSIS — R1310 Dysphagia, unspecified: Secondary | ICD-10-CM | POA: Diagnosis not present

## 2017-05-09 DIAGNOSIS — K228 Other specified diseases of esophagus: Secondary | ICD-10-CM | POA: Diagnosis not present

## 2017-05-09 DIAGNOSIS — R1310 Dysphagia, unspecified: Secondary | ICD-10-CM | POA: Diagnosis not present

## 2017-05-09 DIAGNOSIS — Z1389 Encounter for screening for other disorder: Secondary | ICD-10-CM | POA: Diagnosis not present

## 2017-05-09 DIAGNOSIS — R1900 Intra-abdominal and pelvic swelling, mass and lump, unspecified site: Secondary | ICD-10-CM | POA: Diagnosis not present

## 2017-05-10 ENCOUNTER — Other Ambulatory Visit (HOSPITAL_COMMUNITY): Payer: Self-pay | Admitting: Adult Health

## 2017-05-10 ENCOUNTER — Telehealth: Payer: Self-pay | Admitting: Internal Medicine

## 2017-05-10 DIAGNOSIS — K2289 Other specified disease of esophagus: Secondary | ICD-10-CM

## 2017-05-10 DIAGNOSIS — K228 Other specified diseases of esophagus: Secondary | ICD-10-CM

## 2017-05-10 NOTE — Telephone Encounter (Signed)
Patient has been scheduled for an office visit with Tye Savoy RNP on 05/19/17 2:30.  I scheduled with daughter.  PET scan is scheduled at [facility] for Monday according to Victoria Vera.  Patient is asked to call [facility] to discuss that.  BS where mass was found was done in [place].  The scan is not in our system.

## 2017-05-10 NOTE — Telephone Encounter (Signed)
Patient states he was seen today 4.23.19 by oncology and was told he had an esophageal mass. Report in epic. Patient wanting to know if he needs an ov. Last seen by Dr.Gessner 2017. Please advise on scheduling.

## 2017-05-16 ENCOUNTER — Ambulatory Visit (HOSPITAL_COMMUNITY)
Admission: RE | Admit: 2017-05-16 | Discharge: 2017-05-16 | Disposition: A | Discharge status: Home / Self Care | Payer: PPO | Source: Ambulatory Visit | Attending: Adult Health | Admitting: Adult Health

## 2017-05-16 DIAGNOSIS — K2289 Other specified disease of esophagus: Secondary | ICD-10-CM

## 2017-05-16 DIAGNOSIS — K228 Other specified diseases of esophagus: Secondary | ICD-10-CM

## 2017-05-16 DIAGNOSIS — R590 Localized enlarged lymph nodes: Secondary | ICD-10-CM | POA: Diagnosis not present

## 2017-05-16 DIAGNOSIS — K229 Disease of esophagus, unspecified: Secondary | ICD-10-CM | POA: Insufficient documentation

## 2017-05-16 DIAGNOSIS — I700 Atherosclerosis of aorta: Secondary | ICD-10-CM | POA: Diagnosis not present

## 2017-05-16 MED ORDER — FLUDEOXYGLUCOSE F - 18 (FDG) INJECTION
10.0000 | Freq: Once | INTRAVENOUS | Status: AC | PRN
  Administered 2017-05-16: 11 via INTRAVENOUS

## 2017-05-17 ENCOUNTER — Ambulatory Visit (INDEPENDENT_AMBULATORY_CARE_PROVIDER_SITE_OTHER): Payer: PPO | Admitting: Nurse Practitioner

## 2017-05-17 ENCOUNTER — Encounter: Payer: Self-pay | Admitting: Internal Medicine

## 2017-05-17 ENCOUNTER — Ambulatory Visit (HOSPITAL_COMMUNITY): Payer: PPO | Admitting: Hematology

## 2017-05-17 ENCOUNTER — Encounter: Payer: Self-pay | Admitting: Nurse Practitioner

## 2017-05-17 VITALS — BP 120/80 | HR 74 | Ht 67.0 in | Wt 181.8 lb

## 2017-05-17 DIAGNOSIS — K228 Other specified diseases of esophagus: Secondary | ICD-10-CM

## 2017-05-17 DIAGNOSIS — K2289 Other specified disease of esophagus: Secondary | ICD-10-CM

## 2017-05-17 DIAGNOSIS — K229 Disease of esophagus, unspecified: Secondary | ICD-10-CM

## 2017-05-17 NOTE — Progress Notes (Addendum)
IMPRESSION and PLAN:    #1. 84 yo male with dysphagia / documented 16 pound weight loss since late January and a 3.5 cm esophageal mass in the mid to lower third of the thoracic esophagus seen on PET. PET was done to evaluate this mass originally seen on esophagram (I don't have report) -Needs EGD with biopsies of esophageal mass. The risks and benefits of EGD were discussed and the patient agrees to proceed. He might need EUS at some point  #2.  Hx of anterior rectal cancer 2016, s/p resection with colostomy. He has hx of prostate cancer in 2001. PSA had been slowly rising over the years and at time of rectal cancer surgery there was no surgical plane between rectum and prostateso he underwent colostomy and cystoprostatectomy / colon conduit urinary diversion.  -patient due for surveillance colonoscopy but needs EGD for evaluation of esophageal mass first.      Agree with Ms. Vanita Ingles assessment and plan. Gatha Mayer, MD, [facility]   HPI:    Chief Complaint: dysphagia / esophageal mass   Patient is an 84 year old male with a history of prostate cancer 2001 s/p seed therapy and and IIIB anterior rectal cancer (2016). His PSA had been slowly rising over the years. Additionally there was no surgical plane between rectal cancer and prostate at time of rectal resection so at time of rectal surgery  Patient hospitalized in early Febuary for repair of an enlarging incisional hernia. Five days after discharge he was readmitted, taen back to OR for evisceration  Patient is here with daughter Jackelyn Poling for evaluation of dysphagia and an esophageal mass seen on an esophagram apparently done in [place]. Patient has been having solid food dysphagia for a couple of months. He feels like food rubbing against something in esophagus as it tries to pass. He has lost a significant amount of weight due to the dysphagia.  I do not have esophagram results but he subsequently had a PET scan revealing a  hypermetabolic solid 3.5 cm esophageal mass in the mid to lower third of the thoracic esophagus, compatible with primary esophageal malignancy.   Review of systems:     No coughing. No chest pain. No SOB.   Past Medical History:  Diagnosis Date  . Arthritis   . Colostomy in place [facility])   . GERD (gastroesophageal reflux disease)   . History of colon polyps   . History of hiatal hernia   . 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 HTN (hypertension)    followed by pcp  . Hyperlipidemia   . Presence of urostomy ([place])   . Rectal cancer ([place]) 10/07/2014   no chemo or radiation/ bladder was removed also  . Tinnitus    per pt intermittant   . Wears glasses     Patient's surgical history, family medical history, social history, medications and allergies were all reviewed in Epic    Physical Exam:     BP 120/80   Pulse 74   Ht 5\' 7"  (1.702 m)   Wt 181 lb 12.8 oz (82.5 kg)   BMI 28.47 kg/m   GENERAL: pleasant white male in NAD PSYCH: :Pleasant, cooperative, normal affect EENT:  conjunctiva pink, mucous membranes moist, neck supple without masses CARDIAC:  RRR, no peripheral edema PULM: Normal respiratory effort, lungs CTA bilaterally, no wheezing ABDOMEN:  Nondistended, soft,  nontender. No obvious masses, no hepatomegaly,  normal bowel sounds SKIN:  turgor, no lesions seen Musculoskeletal:  Normal muscle tone, normal strength NEURO: Alert and oriented x 3, no focal neurologic deficits   Tye Savoy , NP 05/17/2017, 10:33 AM

## 2017-05-17 NOTE — Patient Instructions (Signed)
If you are age 82 or older, your body mass index should be between 23-30. Your Body mass index is 28.47 kg/m. If this is out of the aforementioned range listed, please consider follow up with your Primary Care Provider.  If you are age 82 or younger, your body mass index should be between 19-25. Your Body mass index is 28.47 kg/m. If this is out of the aformentioned range listed, please consider follow up with your Primary Care Provider.    You have been scheduled for an endoscopy. Please follow written instructions given to you at your visit today. If you use inhalers (even only as needed), please bring them with you on the day of your procedure. Your physician has requested that you go to www.startemmi.com and enter the access code given to you at your visit today. This web site gives a general overview about your procedure. However, you should still follow specific instructions given to you by our office regarding your preparation for the procedure.  Thank you for choosing me and [facility].   Tye Savoy, NP

## 2017-05-19 ENCOUNTER — Ambulatory Visit: Payer: PPO | Admitting: Nurse Practitioner

## 2017-05-19 ENCOUNTER — Encounter: Payer: Self-pay | Admitting: Nurse Practitioner

## 2017-05-20 ENCOUNTER — Ambulatory Visit (HOSPITAL_COMMUNITY): Payer: PPO | Admitting: Hematology

## 2017-05-26 ENCOUNTER — Ambulatory Visit (AMBULATORY_SURGERY_CENTER): Payer: PPO | Admitting: Internal Medicine

## 2017-05-26 ENCOUNTER — Other Ambulatory Visit: Payer: Self-pay

## 2017-05-26 ENCOUNTER — Encounter: Payer: Self-pay | Admitting: Internal Medicine

## 2017-05-26 VITALS — BP 138/84 | HR 68 | Temp 98.7°F | Resp 18 | Ht 67.0 in | Wt 181.0 lb

## 2017-05-26 DIAGNOSIS — C158 Malignant neoplasm of overlapping sites of esophagus: Secondary | ICD-10-CM | POA: Diagnosis not present

## 2017-05-26 DIAGNOSIS — Z8546 Personal history of malignant neoplasm of prostate: Secondary | ICD-10-CM | POA: Diagnosis not present

## 2017-05-26 DIAGNOSIS — K228 Other specified diseases of esophagus: Secondary | ICD-10-CM | POA: Diagnosis not present

## 2017-05-26 DIAGNOSIS — R933 Abnormal findings on diagnostic imaging of other parts of digestive tract: Secondary | ICD-10-CM

## 2017-05-26 DIAGNOSIS — C159 Malignant neoplasm of esophagus, unspecified: Secondary | ICD-10-CM

## 2017-05-26 DIAGNOSIS — I10 Essential (primary) hypertension: Secondary | ICD-10-CM | POA: Diagnosis not present

## 2017-05-26 DIAGNOSIS — K2289 Other specified disease of esophagus: Secondary | ICD-10-CM

## 2017-05-26 DIAGNOSIS — Z85048 Personal history of other malignant neoplasm of rectum, rectosigmoid junction, and anus: Secondary | ICD-10-CM | POA: Diagnosis not present

## 2017-05-26 MED ORDER — SODIUM CHLORIDE 0.9 % IV SOLN: 500.0000 mL | Freq: Once | INTRAVENOUS | Status: DC

## 2017-05-26 NOTE — Op Note (Signed)
[facility] Patient Name: Ian Christensen Procedure Date: 05/26/2017 1:12 PM MRN: [id number] Endoscopist: Iva Boop , MD Age: 84 Referring MD:  Date of Birth: [birth year] Gender: Male Account #: [id number] Procedure:                Upper GI endoscopy Indications:              Abnormal PET scan of the GI tract Medicines:                Propofol per Anesthesia, Monitored Anesthesia Care Procedure:                Pre-Anesthesia Assessment:                           - Prior to the procedure, a History and Physical                            was performed, and patient medications and                            allergies were reviewed. The patient's tolerance of                            previous anesthesia was also reviewed. The risks                            and benefits of the procedure and the sedation                            options and risks were discussed with the patient.                            All questions were answered, and informed consent                            was obtained. Prior Anticoagulants: The patient has                            taken no previous anticoagulant or antiplatelet                            agents. ASA Grade Assessment: III - A patient with                            severe systemic disease. After reviewing the risks                            and benefits, the patient was deemed in                            satisfactory condition to undergo the procedure.                           After obtaining informed consent, the endoscope was  passed under direct vision. Throughout the                            procedure, the patient's blood pressure, pulse, and                            oxygen saturations were monitored continuously. The                            Endoscope was introduced through the mouth, and                            advanced to the second part of duodenum. The upper     GI endoscopy was accomplished without difficulty.                            The patient tolerated the procedure well. Scope In: Scope Out: Findings:                 A large, fungating mass with no bleeding was found                            in the mid esophagus and in the distal esophagus,                            30 cm from the incisors. extends to 34-35 cm The                            mass was partially obstructing and not                            circumferential but occupies half circumference.                            Biopsies were taken with a cold forceps for                            histology. Tissue was somewhat soft. Estimated                            blood loss was minimal.                           The exam was otherwise without abnormality. Z line                            at 36-37-cm.                           The cardia and gastric fundus were normal on                            retroflexion. Complications:            No immediate complications. Estimated Blood Loss:  Estimated blood loss was minimal. 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 Biopsied.                           - The examination was otherwise normal. Recommendation:           - Patient has a contact number available for                            emergencies. The signs and symptoms of potential                            delayed complications were discussed with the                            patient. Return to normal activities tomorrow.                            Written discharge instructions were provided to the                            patient.                           - Resume previous diet.                           - Full liquid diet maximum seems best and would use                            nutritional supplements  (Ensure, Boost) Iva Boop, MD 05/26/2017 1:51:41 PM This report has been signed electronically.

## 2017-05-26 NOTE — Progress Notes (Signed)
Per Dr. Carlean Purl send specimen rush. maw

## 2017-05-26 NOTE — Patient Instructions (Addendum)
There is a mass in the esophagus as we knew and it looks like a cancer though I cannot tell you more at this time.  I took biopsies and hope to give you information by Monday, perhaps tomorrow.  I appreciate the opportunity to care for you. Gatha Mayer, MD, FACG  YOU HAD AN ENDOSCOPIC PROCEDURE TODAY AT [facility]:   Refer to the procedure report that was given to you for any specific questions about what was found during the examination.  If the procedure report does not answer your questions, please call your gastroenterologist to clarify.  If you requested that your care partner not be given the details of your procedure findings, then the procedure report has been included in a sealed envelope for you to review at your convenience later.  YOU SHOULD EXPECT: Some feelings of bloating in the abdomen. Passage of more gas than usual.  Walking can help get rid of the air that was put into your GI tract during the procedure and reduce the bloating. If you had a lower endoscopy (such as a colonoscopy or flexible sigmoidoscopy) you may notice spotting of blood in your stool or on the toilet paper. If you underwent a bowel prep for your procedure, you may not have a normal bowel movement for a few days.  Please Note:  You might notice some irritation and congestion in your nose or some drainage.  This is from the oxygen used during your procedure.  There is no need for concern and it should clear up in a day or so.  SYMPTOMS TO REPORT IMMEDIATELY:   Following upper endoscopy (EGD)  Vomiting of blood or coffee ground material  New chest pain or pain under the shoulder blades  Painful or persistently difficult swallowing  New shortness of breath  Fever of 100F or higher  Black, tarry-looking stools  For urgent or emergent issues, a gastroenterologist can be reached at any hour by calling [phone].   DIET:  We do recommend a small meal at first, but then you  may proceed to your regular diet.  Drink plenty of fluids but you should avoid alcoholic beverages for 24 hours.  ACTIVITY:  You should plan to take it easy for the rest of today and you should NOT DRIVE or use heavy machinery until tomorrow (because of the sedation medicines used during the test).    FOLLOW UP: Our staff will call the number listed on your records the next business day following your procedure to check on you and address any questions or concerns that you may have regarding the information given to you following your procedure. If we do not reach you, we will leave a message.  However, if you are feeling well and you are not experiencing any problems, there is no need to return our call.  We will assume that you have returned to your regular daily activities without incident.  If any biopsies were taken you will be contacted by phone or by letter within the next 1-3 weeks.  Please call us at [phone] if you have not heard about the biopsies in 3 weeks.    SIGNATURES/CONFIDENTIALITY: You and/or your care partner have signed paperwork which will be entered into your electronic medical record.  These signatures attest to the fact that that the information above on your After Visit Summary has been reviewed and is understood.  Full responsibility of the confidentiality of this discharge information lies with you  and/or your care-partner. 

## 2017-05-26 NOTE — Progress Notes (Signed)
Called to room to assist during endoscopic procedure.  Patient ID and intended procedure confirmed with present staff. Received instructions for my participation in the procedure from the performing physician.  

## 2017-05-27 ENCOUNTER — Telehealth: Payer: Self-pay

## 2017-05-27 NOTE — Telephone Encounter (Signed)
  Follow up Call-  Call back number 05/26/2017 12/31/2015 10/02/2014  Post procedure Call Back phone  # [phone] cell [phone]  Permission to leave phone message Yes Yes Yes  Some recent data might be hidden     Patient questions:  Do you have a fever, pain , or abdominal swelling? No. Pain Score  0 *  Have you tolerated food without any problems? Yes.    Have you been able to return to your normal activities? Yes.    Do you have any questions about your discharge instructions: Diet   No. Medications  No. Follow up visit  No.  Do you have questions or concerns about your Care? No.  Actions: * If pain score is 4 or above: No action needed, pain <4.

## 2017-05-27 NOTE — Progress Notes (Signed)
I spoke to daughter Bernette Mayers, RD  Informed of squamous cell carcinoma  She will tell her dad  He has oncology appt next week  No recall

## 2017-06-02 ENCOUNTER — Encounter (HOSPITAL_COMMUNITY): Payer: Self-pay | Admitting: Hematology

## 2017-06-02 ENCOUNTER — Inpatient Hospital Stay (HOSPITAL_COMMUNITY): Payer: PPO | Attending: Hematology | Admitting: Hematology

## 2017-06-02 ENCOUNTER — Encounter: Payer: Self-pay | Admitting: Radiation Oncology

## 2017-06-02 VITALS — BP 146/68 | HR 80 | Temp 98.0°F | Resp 16 | Ht 66.75 in | Wt 181.2 lb

## 2017-06-02 DIAGNOSIS — Z8042 Family history of malignant neoplasm of prostate: Secondary | ICD-10-CM

## 2017-06-02 DIAGNOSIS — Z933 Colostomy status: Secondary | ICD-10-CM | POA: Diagnosis not present

## 2017-06-02 DIAGNOSIS — Z8546 Personal history of malignant neoplasm of prostate: Secondary | ICD-10-CM | POA: Diagnosis not present

## 2017-06-02 DIAGNOSIS — Z936 Other artificial openings of urinary tract status: Secondary | ICD-10-CM | POA: Diagnosis not present

## 2017-06-02 DIAGNOSIS — G8929 Other chronic pain: Secondary | ICD-10-CM | POA: Diagnosis not present

## 2017-06-02 DIAGNOSIS — Z800 Family history of malignant neoplasm of digestive organs: Secondary | ICD-10-CM | POA: Diagnosis not present

## 2017-06-02 DIAGNOSIS — C155 Malignant neoplasm of lower third of esophagus: Secondary | ICD-10-CM | POA: Insufficient documentation

## 2017-06-02 DIAGNOSIS — M545 Low back pain: Secondary | ICD-10-CM | POA: Diagnosis not present

## 2017-06-02 NOTE — Patient Instructions (Signed)
[facility] at [facility] Discharge Instructions  You saw Dr. Katragadda today.   Thank you for choosing [facility] at [facility] to provide your oncology and hematology care.  To afford each patient quality time with our provider, please arrive at least 15 minutes before your scheduled appointment time.   If you have a lab appointment with the Cancer Center please come in thru the  Main Entrance and check in at the main information desk  You need to re-schedule your appointment should you arrive 10 or more minutes late.  We strive to give you quality time with our providers, and arriving late affects you and other patients whose appointments are after yours.  Also, if you no show three or more times for appointments you may be dismissed from the clinic at the providers discretion.     Again, thank you for choosing [facility].  Our hope is that these requests will decrease the amount of time that you wait before being seen by our physicians.       _____________________________________________________________  Should you have questions after your visit to [facility], please contact our office at [phone] between the hours of 8:30 a.m. and 4:30 p.m.  Voicemails left after 4:30 p.m. will not be returned until the following business day.  For prescription refill requests, have your pharmacy contact our office.       Resources For Cancer Patients and their Caregivers ? American Cancer Society: Can assist with transportation, wigs, general needs, runs Look Good Feel Better.        1-888-227-6333 ? Cancer Care: Provides financial assistance, online support groups, medication/co-pay assistance.  1-800-813-HOPE (4673) ? [facility] Assists [place] cancer patients and their families through emotional , educational and financial support.  336-427-4357 ? [place] DSS Where to apply for  food stamps, Medicaid and utility assistance. 336-342-1394 ? [facility]: Transportation to medical appointments. 336-347-2287 ? Social Security Administration: May apply for disability if have a Stage IV cancer. 336-342-7796 1-800-772-1213 ? [place] Aging, Disability and Transit Services: Assists with nutrition, care and transit needs. 336-349-2343  Cancer Center Support Programs:   > Cancer Support Group  2nd Tuesday of the month 1pm-2pm, Journey Room   > Creative Journey  3rd Tuesday of the month 1130am-1pm, Journey Room     

## 2017-06-02 NOTE — Assessment & Plan Note (Addendum)
1.  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 - Barium swallow done at [facility] showed a large mass in the distal esophagus. - PET/CT scan on 07/13/6387 hypermetabolic solid 3.5 x 2.3 cm esophageal mass in the mid to lower third of the thoracic esophagus with max SUV 7.3 and nonspecific mildly hypermetabolic nonenlarged bilateral hilar lymph nodes, indeterminate.  We discussed this with the patient in detail. - EGD on 05/26/2017 shows large fungating mass with no bleeding, 30 cm from incisors, extends to 35 cm, partially obstructing, biopsy consistent with squamous cell carcinoma. -Patient currently can eat only soft foods and liquids.  Has lost about 8 pounds in the last 6 months.  Drinks about 3 ensures per day. - I have recommended endoscopic ultrasound for T and and N staging.  We have also talked about upfront placement of feeding tube for nutrition.  He did not agree with that immediately but will think about it.  We will see him back after the EUS. -We also discussed options of chemo radiation therapy given concurrently.  I will also make a referral to radiation oncology at [facility].  2.  Colorectal cancer: - Underwent segmental colon resection in November 2016, found to have a T3N0 adenocarcinoma, with circumferential margins positive.  Patient did not receive any neoadjuvant or adjuvant chemotherapy/radiation therapy.  3.  Prostate cancer: -Originally diagnosed in early 2000s, status post seed implants.  Underwent cystoprostatectomy in November 2016, pathology showed prostatic adenocarcinoma Gleason 8 (4+4).

## 2017-06-02 NOTE — Progress Notes (Signed)
[place] CONSULT NOTE  Patient Care Team: Redmond School, MD as PCP - General (Internal Medicine)  CHIEF COMPLAINTS/PURPOSE OF CONSULTATION:  Esophageal mass, biopsy proven to be squamous cell carcinoma   HISTORY OF PRESENTING ILLNESS:  Ian Christensen 84 y.o. 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   He initially presented to his PCPs office on 05/04/2017 with complaints of 1 to 2 weeks dysphasia.  He was referred for barium swallow for further evaluation, which was completed on 05/09/2017, demonstrating a large multilobulated mass seen in the distal esophagus concerning for malignancy.  Endoscopy was recommended for further evaluation at that time.  (It appears the barium swallow procedure was performed at [facility] in [place]).  The patient's PCP team and referred him to any hand center for further evaluation.  Our nurse practitioner reviewed his case and history.  He was referred to GI urgently for consideration for EGD.  PET scan was also ordered.    PET scan performed on 05/16/2017 revealed hypermetabolic solid 3.5 cm esophageal mass in the mid to lower third of the thoracic esophagus, compatible with primary esophageal malignancy.  No hypermetabolic mediastinal lymph nodes.  There is mention of nonspecific mildly hypermetabolic nonenlarged bilateral hilar lymph nodes, indeterminate for reactive versus metastatic disease; recommended attention on follow-up with CT chest with contrast in 3 to 6 months.  No hypermetabolic extrathoracic for skeletal metastatic disease.  He was seen by Ian Savoy, NP with [facility] on 05/17/17.  EGD with esophageal biopsy performed by Ian Christensen on 05/26/2017 with pathology revealing squamous cell carcinoma of the mid to distal esophagus (per pathology report, mass biopsied at 30-35 cm location).   Past personal oncologic history  significant for:  -Prostate cancer was diagnosed diagnosed in early 2000s; he is not sure. Was treated with brachytherapy.  His urologist is Ian Christensen.   -Segmental colonic resection with cystoprostatectomy performed by Dr. Leighton Christensen on 87/56/43 revealing adenocarcinoma of the colon, and Gleason 8 (4+4) adenocarcinoma of prostate. Bladder without evidence of malignancy on pathology.  -Denies any radiation or chemotherapy after his surgeries in 2016.      INTERVAL HISTORY:  Ian Christensen 84 y.o. male here today for initial consultation for recently diagnosed esophageal cancer.   Here today with his 2 daughters. His oldest daughter is a Microbiologist within the [employer] system.   Dysphagia started about 3-4 weeks ago. He had hernia surgery in 02/2017; he and his family thinks his symptoms may have started around that time.  Endorses pain with swallowing. He is able to tolerate soft foods like applesauce, yogurt, etc.  He supplements with Ensure TID.  States that he has lost ~8 lbs in the past 6 months. Denies any headaches or vision changes. Denies lower extremity edema.   He states that he lives alone and is independent in his ADLs.  Overall, he feels well. He has chronic upper back pain, which he feels like may have gotten a little worse in the past few months.    Reviewed recent pathology and scan results with patient. Staging for esophageal cancer, as well as possible treatment options for each stage of cancer.  Treatment for Ian Christensen will likely include chemoradiation given the size/extent of disease.  He would need temporary PEG tube placement, as well as port-a-cath placement should he choose to proceed with chemotherapy.    Ian Christensen is resistant to considering feeding  tube, as well as chemotherapy and radiation. He expresses concerns about the side effects of treatment and management of another tube, along with his colostomy and his urostomy. He does agree to proceed with EUS procedure.   He also agrees to at least meet with radiation oncologist; he and his family would like to consider having his radiation in [place]. We will place referral to Dr. Ardis Christensen for EUS and [facility] for radiation.       MEDICAL HISTORY:  Past Medical History:  Diagnosis Date  . Arthritis   . Colostomy in place [facility])   . Esophageal cancer ([place])   . GERD (gastroesophageal reflux disease)   . History of colon polyps   . History of hiatal hernia   . 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 HTN (hypertension)    followed by pcp  . Hyperlipidemia   . Presence of urostomy ([place])   . Rectal cancer ([place]) 10/07/2014   no chemo or radiation/ bladder was removed also  . Tinnitus    per pt intermittant   . Wears glasses     SURGICAL HISTORY: Past Surgical History:  Procedure Laterality Date  . APPENDECTOMY  1955  . COLONOSCOPY    . CYSTO N/A 12/11/2014   Procedure:  CYSTO,  PROSTATECTOMY, CYSTECTOMY colon conduit, BILATERAL LYMPH NODE DISSECTION;  Surgeon: Ian Frock, MD;  Location: [facility];  Service: Urology;  Laterality: N/A;  . EUS N/A 10/10/2014   Procedure: LOWER ENDOSCOPIC ULTRASOUND (EUS);  Surgeon: Ian Banister, MD;  Location: [facility];  Service: Endoscopy;  Laterality: N/A;  . HERNIA REPAIR    . INCISIONAL HERNIA REPAIR N/A 03/02/2017   Procedure: INCISIONAL HERNIA REPAIR WITH MESH;  Surgeon: Ian Ruff, MD;  Location: [facility];  Service: General;  Laterality: N/A;  . INSERTION OF MESH  03/02/2017   Procedure: INSERTION OF MESH;  Surgeon: Ian Ruff, MD;  Location: [facility];  Service: General;;  . LAPAROSCOPIC INTERNAL HERNIA REPAIR N/A 03/06/2017   Procedure: REOPENING OF RECENT LAPAROTOMY WITH CLOSURE OF INCISIONAL HERNIA WITH MESH;  Surgeon: Ian Ruff, MD;  Location: [facility];  Service: General;  Laterality: N/A;  . RADIOACTIVE PROSTATE SEED IMPLANTS   02-06-1999  dr Ian Christensen  [facility]  . XI ROBOTIC ASSISTED LOWER ANTERIOR RESECTION N/A 12/11/2014   Procedure: XI ROBOTIC ASSISTED ABDOMINAL PERINEAL RESECTION OMENTOPLASTY;  Surgeon: Ian Ruff, MD;  Location: [facility];  Service: General;  Laterality: N/A;    SOCIAL HISTORY: Social History   Socioeconomic History  . Marital status: Widowed    Spouse name: Not on file  . Number of children: 2  . Years of education: Not on file  . Highest education level: Not on file  Occupational History  . Occupation: retired  Scientific laboratory technician  . Financial resource strain: Not very hard  . Food insecurity:    Worry: Never true    Inability: Never true  . Transportation needs:    Medical: No    Non-medical: No  Tobacco Use  . Smoking status: Never Smoker  . Smokeless tobacco: Never Used  Substance and Sexual Activity  . Alcohol use: Yes    Alcohol/week: 0.0 oz    Comment: seldom  . Drug use: No  . Sexual activity: Not Currently    Comment: Widowed  Lifestyle  . Physical activity:    Days per week: 1 day  Minutes per session: 30 min  . Stress: Not at all  Relationships  . Social connections:    Talks on phone: Three times a week    Gets together: Three times a week    Attends religious service: More than 4 times per year    Active member of club or organization: Yes    Attends meetings of clubs or organizations: More than 4 times per year    Relationship status: Widowed  . Intimate partner violence:    Fear of current or ex partner: No    Emotionally abused: No    Physically abused: No    Forced sexual activity: No  Other Topics Concern  . Not on file  Social History Narrative  . Not on file    FAMILY HISTORY: Family History  Problem Relation Age of Onset  . Diabetes Mother   . Heart disease Mother   . Heart disease Father   . Colon cancer Sister   . Prostate cancer Brother   . Esophageal cancer Neg Hx   . Rectal cancer Neg Hx   . Stomach cancer Neg Hx     ALLERGIES:  is  allergic to morphine and related and oxycodone.  MEDICATIONS:  Current Outpatient Medications  Medication Sig Dispense Refill  . acetaminophen (TYLENOL) 500 MG tablet Take 1,000 mg by mouth every 8 (eight) hours as needed (for pain).     Marland Kitchen aspirin EC 81 MG tablet Take 162 mg by mouth at bedtime. Takes 30 minutes prior to Niacin    . cyanocobalamin 500 MCG tablet Take 500 mcg by mouth daily. Vitamin B-12    . losartan (COZAAR) 25 MG tablet Take 25 mg by mouth daily.    . niacin (NIASPAN) 500 MG CR tablet Take 500 mg by mouth every evening.     . Omega-3 Fatty Acids (FISH OIL) 1200 MG CAPS Take 1,200 mg by mouth daily.    Marland Kitchen omeprazole (PRILOSEC) 20 MG capsule Take 20 mg by mouth every evening.     . simvastatin (ZOCOR) 20 MG tablet Take 20 mg by mouth every evening.      Current Facility-Administered Medications  Medication Dose Route Frequency Provider Last Rate Last Dose  . 0.9 %  sodium chloride infusion  500 mL Intravenous Once Gatha Mayer, MD        REVIEW OF SYSTEMS:   Constitutional: Denies fevers, chills or abnormal night sweats.  Weight loss positive for 8 pounds in the last 6 months. Eyes: Denies blurriness of vision, double vision or watery eyes Ears, nose, mouth, throat, and face: Denies mucositis or sore throat Respiratory: Denies cough, dyspnea or wheezes Cardiovascular: Denies palpitation, chest discomfort or lower extremity swelling Gastrointestinal: Denies any nausea or vomiting.  Has dysphagia to solid foods. Skin: Denies abnormal skin rashes Lymphatics: Denies new lymphadenopathy or easy bruising Neurological:Denies numbness, tingling or new weaknesses Behavioral/Psych: Mood is stable, no new changes  All other systems were reviewed with the patient and are negative.  PHYSICAL EXAMINATION: ECOG PERFORMANCE STATUS: 1 - Symptomatic but completely ambulatory  I have reviewed his vitals today. GENERAL:alert, no distress and comfortable SKIN: skin color, texture,  turgor are normal, no rashes or significant lesions EYES: normal, conjunctiva are pink and non-injected, sclera clear OROPHARYNX:no exudate, no erythema and lips, buccal mucosa, and tongue normal  NECK: supple, thyroid normal size, non-tender, without nodularity LYMPH:  no palpable lymphadenopathy in the cervical, axillary or inguinal LUNGS: clear to auscultation and percussion with normal breathing  effort HEART: regular rate & rhythm and no murmurs and no lower extremity edema ABDOMEN:abdomen soft, non-tender.  Colostomy in place. Musculoskeletal:no cyanosis of digits and no clubbing  PSYCH: alert & oriented x 3 with fluent speech NEURO: no focal motor/sensory deficits  LABORATORY DATA:  I have reviewed the data as listed Lab Results  Component Value Date   WBC 10.7 (H) 03/06/2017   HGB 13.3 03/06/2017   HCT 39.3 03/06/2017   MCV 94.0 03/06/2017   PLT 190 03/06/2017     Chemistry      Component Value Date/Time   NA 138 03/06/2017 0528   K 3.7 03/06/2017 0528   CL 107 03/06/2017 0528   CO2 24 03/06/2017 0528   BUN 25 (H) 03/06/2017 0528   CREATININE 1.04 03/06/2017 0528      Component Value Date/Time   CALCIUM 8.1 (L) 03/06/2017 0528   ALKPHOS 74 03/05/2017 1524   AST 36 03/05/2017 1524   ALT 35 03/05/2017 1524   BILITOT 1.1 03/05/2017 1524       RADIOGRAPHIC STUDIES: I have personally reviewed the radiological images as listed and agreed with the findings in the report. Nm Pet Image Initial (pi) Skull Base To Thigh  Result Date: 05/17/2017 CLINICAL DATA:  Initial treatment strategy for esophageal mass seen on recent barium swallow study performed for dysphagia. Additional history of prostate and rectal cancer. EXAM: NUCLEAR MEDICINE PET SKULL BASE TO THIGH TECHNIQUE: 11.0 mCi F-18 FDG was injected intravenously. Full-ring PET imaging was performed from the skull base to thigh after the radiotracer. CT data was obtained and used for attenuation correction and anatomic  localization. Fasting blood glucose: 114 mg/dl COMPARISON:  03/05/2017 CT abdomen/pelvis. 06/21/2016 CT chest, abdomen and pelvis. 05/09/2017 barium swallow. FINDINGS: Mediastinal blood pool activity: SUV max 3.1 NECK: No hypermetabolic lymph nodes in the neck. Incidental CT findings: none CHEST: There is a hypermetabolic solid 3.5 x 2.3 cm esophageal mass in the mid to lower third of the thoracic esophagus with max SUV 7.3 (series 3/image 115). No hypermetabolic axillary or mediastinal lymph nodes. Mildly hypermetabolic nonenlarged bilateral hilar lymph nodes with max SUV 4.2 in the right hilum and max SUV 4.8 in the left hilum. No hypermetabolic pulmonary findings. Incidental CT findings: Coronary atherosclerosis. Atherosclerotic nonaneurysmal thoracic aorta. No acute consolidative airspace disease or lung masses. A few scattered solid pulmonary nodules in the left lung measuring up to 5 mm (series 3/image 146) are below PET resolution and all stable since 06/21/2016 chest CT, probably benign. No new significant pulmonary nodules. Stable subcentimeter calcified right lower lobe granuloma. ABDOMEN/PELVIS: No abnormal hypermetabolic activity within the liver, pancreas, adrenal glands, or spleen. No hypermetabolic lymph nodes in the abdomen or pelvis. Incidental CT findings: Stable postsurgical changes from LAR with end sigmoid colostomy in the ventral left abdominal wall. Stable postsurgical changes from cystectomy with ileal conduit urinary diversion in the ventral right abdominal wall. Atherosclerotic nonaneurysmal abdominal aorta. Simple 3.3 cm exophytic anterior upper left renal cyst. Brachytherapy seeds are noted in the prostatectomy bed. SKELETON: No focal hypermetabolic activity to suggest skeletal metastasis. Incidental CT findings: none IMPRESSION: 1. Hypermetabolic solid 3.5 cm esophageal mass in the mid to lower third of the thoracic esophagus, compatible with primary esophageal malignancy. 2. No  hypermetabolic mediastinal lymph nodes. 3. Nonspecific mildly hypermetabolic nonenlarged bilateral hilar lymph nodes, indeterminate for reactive versus metastatic disease. Recommend attention on follow-up chest CT with IV contrast in 3-6 months. 4. No hypermetabolic extrathoracic or skeletal metastatic disease. 5. Chronic  findings include: Aortic Atherosclerosis (ICD10-I70.0). Stable extensive postsurgical changes from LAR and cystectomy/ileal conduit urinary diversion. Electronically Signed   By: Ilona Sorrel M.D.   On: 05/17/2017 10:09    ASSESSMENT & PLAN:  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 ([place]) 1.  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 - Barium swallow done at [facility] showed a large mass in the distal esophagus. - PET/CT scan on 7/67/2094 hypermetabolic solid 3.5 x 2.3 cm esophageal mass in the mid to lower third of the thoracic esophagus with max SUV 7.3 and nonspecific mildly hypermetabolic nonenlarged bilateral hilar lymph nodes, indeterminate.  We discussed this with the patient in detail. - EGD on 05/26/2017 shows large fungating mass with no bleeding, 30 cm from incisors, extends to 35 cm, partially obstructing, biopsy consistent with squamous cell carcinoma. -Patient currently can eat only soft foods and liquids.  Has lost about 8 pounds in the last 6 months.  Drinks about 3 ensures per day. - I have recommended endoscopic ultrasound for T and and N staging.  We have also talked about upfront placement of feeding tube for nutrition.  He did not agree with that immediately but will think about it.  We will see him back after the EUS. -We also discussed options of chemo radiation therapy given concurrently.  I will also make a referral to radiation oncology at [facility] long.  2.  Colorectal cancer: - Underwent segmental colon resection in November 2016, found to have a T3N0 adenocarcinoma,  with circumferential margins positive.  Patient did not receive any neoadjuvant or adjuvant chemotherapy/radiation therapy.  3.  Prostate cancer: -Originally diagnosed in early 2000s, status post seed implants.  Underwent cystoprostatectomy in November 2016, pathology showed prostatic adenocarcinoma Gleason 8 (4+4).  No orders of the defined types were placed in this encounter.   All questions were answered. The patient knows to call the clinic with any problems, questions or concerns.    This note includes documentation from Mike Craze, NP, who was present during this patient's office visit and evaluation.  I have reviewed this note for its completeness and accuracy.  I have edited this note accordingly based on my findings and medical opinion.      Derek Jack, MD 06/02/2017 4:04 PM

## 2017-06-07 ENCOUNTER — Telehealth (HOSPITAL_COMMUNITY): Payer: Self-pay | Admitting: General Practice

## 2017-06-07 NOTE — Telephone Encounter (Signed)
[place] Progress Note   Brief call to check on patient and assess for needs/resources.  Patient wanted communication to be through his daughters as he was having difficulty processing information during initial stage of cancer diagnosis process.  CSW spoke w daughter Bernette Mayers, daughter works in healthcare and is familiar w resources.  Patient has history of two prior diagnoses of cancer - both treated surgically.  Current treatment plan may include chemotherapy and radiation, which will be new for patient.  Patient lives alone, is a widower.  Patient's two daughters are supportive and have helped arrange in home care after past surgery through local in home care company.  Plan to assess needs and arrange resources as indicated.  Daughters and patient are awaiting upcoming meeting to discuss radiation treatment, are aware of intensity of appointment schedule.  Currently patient drives himself to appointments, lives alone and manages urostomy and colostomy care independently.  However, is concerned about possible fatigue limiting his ability to continue to live independently.  Family will problem solve as options become clearer.  Are aware of resources through Social Work and [employer], will contact [place] going forward w any needs.  Edwyna Shell, LCSW Clinical Social Worker Phone:  [phone]

## 2017-06-08 ENCOUNTER — Telehealth: Payer: Self-pay | Admitting: Gastroenterology

## 2017-06-08 NOTE — Telephone Encounter (Signed)
Dr Jacobs please review  

## 2017-06-08 NOTE — Progress Notes (Signed)
GI Location of Tumor / Histology: 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  Ian Christensen presented with dysphagia to solid foods since February 2019.  Barium swallow done on 05/09/2017 at [facility] showed a large mass in the distal esophagus.  PET/CT scan 3/38/2505: hypermetabolic solid 3.5 x 2.3 cm esophageal mass in the mid to lower third of the thoracic esophagus with max SUV 7.3 and nonspecific mildly hypermetabolic nonenlarged bilateral hilar lymph nodes, indeterminate.  EGD 05/26/2017: Large fungating mass with no bleeding, 30 cm from incisors, extends to 35 cm, partially obstructing, biopsy consistent with squamous cell carcinoma.  Biopsies of Esophagus 05/26/2017    Past/Anticipated interventions by surgeon, if any:    Past/Anticipated interventions by medical oncology, if any:  NP Mike Craze 06/02/2017 -Reviewed recent pathology and scan results with patient. Staging for esophageal cancer, as well as possible treatment options for each stage of cancer.  Treatment for Ian Christensen will likely include chemoradiation given the size/extent of disease.  He would need temporary PEG tube placement, as well as port-a-cath placement should he choose to proceed with chemotherapy.   -Ian Christensen is resistant to considering feeding tube, as well as chemotherapy and radiation. He expresses concerns about the side effects of treatment and management of another tube, along with his colostomy and his urostomy. He does agree to proceed with EUS procedure.  He also agrees to at least meet with radiation oncologist; he and his family would like to consider having his radiation in [place]. We will place referral to Dr. Ardis Hughs for EUS and [facility] for radiation.    Dr. Delton Coombes 06/02/2017 - I have recommended endoscopic ultrasound for T and and N staging.  We have also talked about upfront placement of feeding tube for nutrition.  He did not agree with that immediately but will  think about it.  We will see him back after the EUS. -We also discussed options of chemo radiation therapy given concurrently.  I will also make a referral to radiation oncology at [facility] long. Colorectal cancer: - Underwent segmental colon resection in November 2016, found to have a T3N0 adenocarcinoma, with circumferential margins positive.  Patient did not receive any neoadjuvant or adjuvant chemotherapy/radiation therapy. Prostate cancer: -Originally diagnosed in early 2000s, status post seed implants.  Underwent cystoprostatectomy in November 2016, pathology showed prostatic adenocarcinoma Gleason 8 (4+4).   Weight changes, if any: 8 pounds lost over about 3 months.  Bowel/Bladder complaints, if any: No issues, has a urostomy and colostomy.  Nausea / Vomiting, if any: No  Pain issues, if any:  Pain in his throat related to swallowing.  Any blood per rectum: no  BP (!) 165/78 (BP Location: Left Arm, Patient Position: Sitting, Cuff Size: Normal)   Pulse 71   Temp 98.6 F (37 C) (Oral)   Resp 18   Ht 5\' 7"  (1.702 m)   Wt 181 lb 3.2 oz (82.2 kg)   SpO2 99%   BMI 28.38 kg/m    Wt Readings from Last 3 Encounters:  06/09/17 181 lb 3.2 oz (82.2 kg)  06/02/17 181 lb 3.2 oz (82.2 kg)  05/26/17 181 lb (82.1 kg)    SAFETY ISSUES:  Prior radiation? Brachytherapy for prostate cancer in early 2000s.  Pacemaker/ICD? No  Possible current pregnancy? No  Is the patient on methotrexate? No  Current Complaints/Details:

## 2017-06-08 NOTE — Telephone Encounter (Signed)
Dr. Delton Coombes, I received information about doing an EUS to stage his recently diagnosed GE junction cancer.   From reading your office note, it does not appear that he is being considered for surgical resection and since that is the case I do not think EUS staging is necessary as the results with not likely change management.  Please contact me to discuss this if you have any question or concerns. My cell is [phone].  Thanks    [place], [employer].  I am not sure that EUS is really necessary here.   Radonna Ricker

## 2017-06-08 NOTE — Telephone Encounter (Signed)
I have informed the pt's daughter of the plan NOT to proceed with the EUS.  She will call Dr Tomie China office for further information on next steps

## 2017-06-09 ENCOUNTER — Encounter: Payer: Self-pay | Admitting: Radiation Oncology

## 2017-06-09 ENCOUNTER — Other Ambulatory Visit: Payer: Self-pay

## 2017-06-09 ENCOUNTER — Ambulatory Visit
Admission: RE | Admit: 2017-06-09 | Discharge: 2017-06-09 | Disposition: A | Discharge status: Home / Self Care | Payer: PPO | Source: Ambulatory Visit | Attending: Radiation Oncology | Admitting: Radiation Oncology

## 2017-06-09 ENCOUNTER — Other Ambulatory Visit (HOSPITAL_COMMUNITY): Payer: Self-pay | Admitting: Hematology

## 2017-06-09 VITALS — BP 165/78 | HR 71 | Temp 98.6°F | Resp 18 | Ht 67.0 in | Wt 181.2 lb

## 2017-06-09 DIAGNOSIS — Z7982 Long term (current) use of aspirin: Secondary | ICD-10-CM | POA: Diagnosis not present

## 2017-06-09 DIAGNOSIS — C155 Malignant neoplasm of lower third of esophagus: Secondary | ICD-10-CM | POA: Diagnosis not present

## 2017-06-09 DIAGNOSIS — Z936 Other artificial openings of urinary tract status: Secondary | ICD-10-CM | POA: Insufficient documentation

## 2017-06-09 DIAGNOSIS — I700 Atherosclerosis of aorta: Secondary | ICD-10-CM | POA: Insufficient documentation

## 2017-06-09 DIAGNOSIS — K449 Diaphragmatic hernia without obstruction or gangrene: Secondary | ICD-10-CM | POA: Insufficient documentation

## 2017-06-09 DIAGNOSIS — Z8546 Personal history of malignant neoplasm of prostate: Secondary | ICD-10-CM | POA: Diagnosis not present

## 2017-06-09 DIAGNOSIS — E785 Hyperlipidemia, unspecified: Secondary | ICD-10-CM | POA: Insufficient documentation

## 2017-06-09 DIAGNOSIS — Z8042 Family history of malignant neoplasm of prostate: Secondary | ICD-10-CM | POA: Insufficient documentation

## 2017-06-09 DIAGNOSIS — Z86010 Personal history of colonic polyps: Secondary | ICD-10-CM | POA: Insufficient documentation

## 2017-06-09 DIAGNOSIS — Z85038 Personal history of other malignant neoplasm of large intestine: Secondary | ICD-10-CM | POA: Diagnosis not present

## 2017-06-09 DIAGNOSIS — Z79899 Other long term (current) drug therapy: Secondary | ICD-10-CM | POA: Diagnosis not present

## 2017-06-09 DIAGNOSIS — Z800 Family history of malignant neoplasm of digestive organs: Secondary | ICD-10-CM | POA: Insufficient documentation

## 2017-06-09 DIAGNOSIS — Z933 Colostomy status: Secondary | ICD-10-CM | POA: Diagnosis not present

## 2017-06-09 DIAGNOSIS — Z85048 Personal history of other malignant neoplasm of rectum, rectosigmoid junction, and anus: Secondary | ICD-10-CM | POA: Insufficient documentation

## 2017-06-09 DIAGNOSIS — K219 Gastro-esophageal reflux disease without esophagitis: Secondary | ICD-10-CM | POA: Diagnosis not present

## 2017-06-09 DIAGNOSIS — I10 Essential (primary) hypertension: Secondary | ICD-10-CM | POA: Insufficient documentation

## 2017-06-09 NOTE — Progress Notes (Signed)
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   HISTORY OF PRESENT ILLNESS: Ian Christensen is a 84 y.o. male seen at the request of Dr. Ellin Saba for a newly diagnosed squamous cell carcinoma of the distal third of the esophagus. The patient had been experiencing several weeks of dysphagia, and proceeded with barium swallow that revealed concerns for an obstructing lesion in the distal esophagus. He proceeded with PET scan on 05/16/17 revealing a 3.5 x 2.3 cm mass in the mid to lower third of the thoracic esophagus with an SUV of 7.3, he had nonspecific mildly hypermetabolic nonenlarged bilateral hilar nodes that was felt to be indeterminate.  On 05/26/2017 he underwent endoscopy with Dr. Concha Se revealing a large fungating mass without evidence of bleeding that was 30 cm from the incisors extending 5 cm, partially obstructing, and his biopsy was consistent with squamous cell carcinoma.  He is scheduled to undergo PEG tube placement for enteral feeding, and comes today to discuss the options of chemo RT.  Of note the patient does have a history of prior cancer.  In 2002 he was treated with Dr. Roselind Messier with radioactive seed implant.  In 2016 he was diagnosed with colon cancer and underwent segmental colon resection, he was found to have a T3N0 adenocarcinoma with positive margins.  He did not receive any adjuvant therapy.  At the time of his surgery he also underwent cystoprostatectomy which revealed a Gleason 4+4 adenocarcinoma.  He is followed by Dr. Annabell Howells and his PSA per report remains undetectable.  He is scheduled to be seen next month for repeat assessment.   PREVIOUS  RADIATION THERAPY: Yes   2002: Radioactive seed implant for prostate cancer   PAST MEDICAL HISTORY:  Past Medical History:  Diagnosis Date  . Arthritis   . Colostomy in place [facility])   . Esophageal cancer (HCC)   . GERD (gastroesophageal reflux disease)   . History of colon polyps   . History of hiatal hernia   . 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 HTN (hypertension)    followed by pcp  . Hyperlipidemia   . Presence of urostomy (HCC)   . Rectal cancer (HCC) 10/07/2014   no chemo or radiation/ bladder was removed also  . Tinnitus    per pt intermittant   . Wears glasses        PAST SURGICAL HISTORY: Past Surgical History:  Procedure Laterality Date  . APPENDECTOMY  1955  . COLONOSCOPY    . CYSTO N/A 12/11/2014   Procedure:  CYSTO,  PROSTATECTOMY, CYSTECTOMY colon conduit, BILATERAL LYMPH NODE DISSECTION;  Surgeon: Sebastian Ache, MD;  Location: [facility];  Service: Urology;  Laterality: N/A;  . EUS N/A 10/10/2014   Procedure: LOWER ENDOSCOPIC ULTRASOUND (EUS);  Surgeon: Rachael Fee, MD;  Location: [facility];  Service: Endoscopy;  Laterality: N/A;  . HERNIA REPAIR    . INCISIONAL HERNIA REPAIR N/A 03/02/2017   Procedure: INCISIONAL HERNIA REPAIR WITH MESH;  Surgeon: Romie Levee, MD;  Location: [facility];  Service:  General;  Laterality: N/A;  . INSERTION OF MESH  03/02/2017   Procedure: INSERTION OF MESH;  Surgeon: Romie Levee, MD;  Location: [facility];  Service: General;;  . LAPAROSCOPIC INTERNAL HERNIA REPAIR N/A 03/06/2017   Procedure: REOPENING OF RECENT LAPAROTOMY WITH CLOSURE OF INCISIONAL HERNIA WITH MESH;  Surgeon: Romie Levee, MD;  Location: [facility];  Service: General;  Laterality: N/A;  . RADIOACTIVE PROSTATE SEED IMPLANTS  02-06-1999  dr Annabell Howells  [facility]  . XI ROBOTIC ASSISTED LOWER ANTERIOR RESECTION N/A 12/11/2014   Procedure: XI ROBOTIC ASSISTED  ABDOMINAL PERINEAL RESECTION OMENTOPLASTY;  Surgeon: Romie Levee, MD;  Location: [facility];  Service: General;  Laterality: N/A;     FAMILY HISTORY:  Family History  Problem Relation Age of Onset  . Diabetes Mother   . Heart disease Mother   . Heart disease Father   . Colon cancer Sister   . Prostate cancer Brother   . Esophageal cancer Neg Hx   . Rectal cancer Neg Hx   . Stomach cancer Neg Hx      SOCIAL HISTORY:  reports that he has never smoked. He has never used smokeless tobacco. He reports that he drinks alcohol. He reports that he does not use drugs. The patient is widowed and lives in [place]. 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 He's accompanied by his two daughters, one of whom is a Scientific laboratory technician.   ALLERGIES: Morphine and related and Oxycodone   MEDICATIONS:  Current Outpatient Medications  Medication Sig Dispense Refill  . acetaminophen (TYLENOL) 500 MG tablet Take 1,000 mg by mouth every 8 (eight) hours as needed (for pain).     Marland Kitchen aspirin EC 81 MG tablet Take 162 mg by mouth at bedtime. Takes 30 minutes prior to Niacin    . cyanocobalamin 500 MCG tablet Take 500 mcg by mouth daily. Vitamin B-12    . losartan (COZAAR) 25 MG tablet Take 25 mg by mouth daily.    . niacin (NIASPAN) 500 MG CR tablet Take 500 mg by mouth every evening.     . Omega-3 Fatty Acids (FISH OIL) 1200 MG CAPS Take 1,200 mg by mouth daily.    Marland Kitchen omeprazole (PRILOSEC) 20 MG capsule Take 20 mg by mouth every evening.     . simvastatin (ZOCOR) 20 MG tablet Take 20 mg by mouth every evening.      Current Facility-Administered Medications  Medication Dose Route Frequency Provider Last Rate Last Dose  . 0.9 %  sodium chloride infusion  500 mL Intravenous Once Iva Boop, MD         REVIEW OF SYSTEMS: On review of systems, the patient reports that he is doing well overall. He denies any chest pain, shortness of breath, cough, fevers, chills, night sweats, unintended  weight changes. He is having a hard time swallowing meats, bread, and even at times soft foods. He is drinking 3 nutritional shakes per day and eating soups, oatmeal, and yogurts. He denies any bowel or bladder disturbances, and denies abdominal pain, nausea or vomiting. he denies any new musculoskeletal or joint aches or pains. A complete review of systems is obtained and is otherwise negative.     PHYSICAL EXAM:  Wt Readings from Last 3 Encounters:  06/09/17 181 lb 3.2 oz (82.2 kg)  06/02/17 181 lb 3.2 oz (82.2 kg)  05/26/17 181 lb (82.1 kg)   Temp Readings from Last 3 Encounters:  06/09/17 98.6 F (37 C) (Oral)  06/02/17 98 F (  36.7 C) (Oral)  05/26/17 98.7 F (37.1 C)   BP Readings from Last 3 Encounters:  06/09/17 (!) 165/78  06/02/17 (!) 146/68  05/26/17 138/84   Pulse Readings from Last 3 Encounters:  06/09/17 71  06/02/17 80  05/26/17 68   Pain Assessment Pain Score: 2 (top of throat)/10  In general this is a well appearing caucasian male in no acute distress. He is alert and oriented x4 and appropriate throughout the examination. HEENT reveals that the patient is normocephalic, atraumatic. EOMs are intact. PERRLA. Skin is intact without any evidence of gross lesions. Cardiovascular exam reveals a regular rate and rhythm, no clicks rubs or murmurs are auscultated. Chest is clear to auscultation bilaterally. Lymphatic assessment is performed and does not reveal any adenopathy in the cervical, supraclavicular, axillary, or inguinal chains. Abdomen has active bowel sounds in all quadrants and is intact. The abdomen is soft, non tender, non distended. Lower extremities are negative for pretibial pitting edema, deep calf tenderness, cyanosis or clubbing.   ECOG = 1  0 - Asymptomatic (Fully active, able to carry on all predisease activities without restriction)  1 - Symptomatic but completely ambulatory (Restricted in physically strenuous activity but ambulatory and able to  carry out work of a light or sedentary nature. For example, light housework, office work)  2 - Symptomatic, <50% in bed during the day (Ambulatory and capable of all self care but unable to carry out any work activities. Up and about more than 50% of waking hours)  3 - Symptomatic, >50% in bed, but not bedbound (Capable of only limited self-care, confined to bed or chair 50% or more of waking hours)  4 - Bedbound (Completely disabled. Cannot carry on any self-care. Totally confined to bed or chair)  5 - Death   Santiago Glad MM, Creech RH, Tormey DC, et al. [phone]). "Toxicity and response criteria of the [facility]". Am. Evlyn Clines. Oncol. 5 (6): 649-55    LABORATORY DATA:  Lab Results  Component Value Date   WBC 10.7 (H) 03/06/2017   HGB 13.3 03/06/2017   HCT 39.3 03/06/2017   MCV 94.0 03/06/2017   PLT 190 03/06/2017   Lab Results  Component Value Date   NA 138 03/06/2017   K 3.7 03/06/2017   CL 107 03/06/2017   CO2 24 03/06/2017   Lab Results  Component Value Date   ALT 35 03/05/2017   AST 36 03/05/2017   ALKPHOS 74 03/05/2017   BILITOT 1.1 03/05/2017      RADIOGRAPHY: Nm Pet Image Initial (pi) Skull Base To Thigh  Result Date: 05/17/2017 CLINICAL DATA:  Initial treatment strategy for esophageal mass seen on recent barium swallow study performed for dysphagia. Additional history of prostate and rectal cancer. EXAM: NUCLEAR MEDICINE PET SKULL BASE TO THIGH TECHNIQUE: 11.0 mCi F-18 FDG was injected intravenously. Full-ring PET imaging was performed from the skull base to thigh after the radiotracer. CT data was obtained and used for attenuation correction and anatomic localization. Fasting blood glucose: 114 mg/dl COMPARISON:  56/21/3086 CT abdomen/pelvis. 06/21/2016 CT chest, abdomen and pelvis. 05/09/2017 barium swallow. FINDINGS: Mediastinal blood pool activity: SUV max 3.1 NECK: No hypermetabolic lymph nodes in the neck. Incidental CT findings: none CHEST:  There is a hypermetabolic solid 3.5 x 2.3 cm esophageal mass in the mid to lower third of the thoracic esophagus with max SUV 7.3 (series 3/image 115). No hypermetabolic axillary or mediastinal lymph nodes. Mildly hypermetabolic nonenlarged bilateral hilar lymph nodes with max  SUV 4.2 in the right hilum and max SUV 4.8 in the left hilum. No hypermetabolic pulmonary findings. Incidental CT findings: Coronary atherosclerosis. Atherosclerotic nonaneurysmal thoracic aorta. No acute consolidative airspace disease or lung masses. A few scattered solid pulmonary nodules in the left lung measuring up to 5 mm (series 3/image 146) are below PET resolution and all stable since 06/21/2016 chest CT, probably benign. No new significant pulmonary nodules. Stable subcentimeter calcified right lower lobe granuloma. ABDOMEN/PELVIS: No abnormal hypermetabolic activity within the liver, pancreas, adrenal glands, or spleen. No hypermetabolic lymph nodes in the abdomen or pelvis. Incidental CT findings: Stable postsurgical changes from LAR with end sigmoid colostomy in the ventral left abdominal wall. Stable postsurgical changes from cystectomy with ileal conduit urinary diversion in the ventral right abdominal wall. Atherosclerotic nonaneurysmal abdominal aorta. Simple 3.3 cm exophytic anterior upper left renal cyst. Brachytherapy seeds are noted in the prostatectomy bed. SKELETON: No focal hypermetabolic activity to suggest skeletal metastasis. Incidental CT findings: none IMPRESSION: 1. Hypermetabolic solid 3.5 cm esophageal mass in the mid to lower third of the thoracic esophagus, compatible with primary esophageal malignancy. 2. No hypermetabolic mediastinal lymph nodes. 3. Nonspecific mildly hypermetabolic nonenlarged bilateral hilar lymph nodes, indeterminate for reactive versus metastatic disease. Recommend attention on follow-up chest CT with IV contrast in 3-6 months. 4. No hypermetabolic extrathoracic or skeletal metastatic  disease. 5. Chronic findings include: Aortic Atherosclerosis (ICD10-I70.0). Stable extensive postsurgical changes from LAR and cystectomy/ileal conduit urinary diversion. Electronically Signed   By: Delbert Phenix M.D.   On: 05/17/2017 10:09       IMPRESSION/PLAN: 1. Stage IIA, cT2N0 squamous cell carcinoma of the distal third of the esophagus. Dr. Mitzi Hansen discusses the pathology findings and reviews the nature of esophageal cancers. The patient is not leaning towards surgery at this time and his EUS was cancelled. He is considering options of chemoRT, and we discussed this approach versus palliative radiotherapy. The patient has not yet discussed details of chemotherapy with Dr. Ellin Saba, but would like to hear more about his options following his G tube placement. We will be in touch with him following this. He is also aware that if he wanted to consider surgical options, he would need to discuss this prior to G tube placement, as he'd really need J tube instead. He is also aware of the possibility of avoiding enteral feeding as his weigh has been stable. We discussed the risks, benefits, short, and long term effects of radiotherapy, and the patient will consider this. Dr. Mitzi Hansen discusses the delivery and logistics of radiotherapy and anticipates a course of 5 1/2 weeks of radiotherapy. We will follow up with him once he's had a chance to consider his options. He did not opt to sign consent at this time.  In a visit lasting 60 minutes, greater than 50% of the time was spent face to face discussing his case, and coordinating the patient's care.   The above documentation reflects my direct findings during this shared patient visit. Please see the separate note by Dr. Mitzi Hansen on this date for the remainder of the patient's plan of care.    Osker Mason, PAC

## 2017-06-14 ENCOUNTER — Telehealth: Payer: Self-pay | Admitting: Radiation Oncology

## 2017-06-15 NOTE — Telephone Encounter (Signed)
I spoke with the patient and he is interested in moving forward with treatment with chemoRT. He is not interested in proceeding with G tube at this time and would like to try to avoid this if possible. I will let Dr. Delton Coombes know as well and we will proceed with simulation. He will need to sign consent when he returns. Our plan would be for him to start treatment on 06/27/17.

## 2017-06-16 ENCOUNTER — Ambulatory Visit (HOSPITAL_COMMUNITY): Payer: PPO | Admitting: Hematology

## 2017-06-17 ENCOUNTER — Ambulatory Visit
Admission: RE | Admit: 2017-06-17 | Discharge: 2017-06-17 | Disposition: A | Discharge status: Home / Self Care | Payer: PPO | Source: Ambulatory Visit | Attending: Radiation Oncology | Admitting: Radiation Oncology

## 2017-06-17 DIAGNOSIS — C155 Malignant neoplasm of lower third of esophagus: Secondary | ICD-10-CM | POA: Diagnosis not present

## 2017-06-17 DIAGNOSIS — Z510 Encounter for antineoplastic radiation therapy: Secondary | ICD-10-CM | POA: Insufficient documentation

## 2017-06-21 ENCOUNTER — Ambulatory Visit (HOSPITAL_COMMUNITY): Payer: PPO

## 2017-06-21 ENCOUNTER — Other Ambulatory Visit (HOSPITAL_COMMUNITY): Payer: PPO

## 2017-06-23 ENCOUNTER — Inpatient Hospital Stay (HOSPITAL_COMMUNITY): Payer: PPO | Attending: Hematology | Admitting: Hematology

## 2017-06-23 ENCOUNTER — Other Ambulatory Visit: Payer: Self-pay

## 2017-06-23 ENCOUNTER — Encounter (HOSPITAL_COMMUNITY): Payer: Self-pay | Admitting: Hematology

## 2017-06-23 VITALS — BP 157/78 | HR 78 | Temp 98.2°F | Resp 16 | Wt 178.0 lb

## 2017-06-23 DIAGNOSIS — C19 Malignant neoplasm of rectosigmoid junction: Secondary | ICD-10-CM | POA: Diagnosis not present

## 2017-06-23 DIAGNOSIS — Z8546 Personal history of malignant neoplasm of prostate: Secondary | ICD-10-CM | POA: Insufficient documentation

## 2017-06-23 DIAGNOSIS — Z85048 Personal history of other malignant neoplasm of rectum, rectosigmoid junction, and anus: Secondary | ICD-10-CM | POA: Diagnosis not present

## 2017-06-23 DIAGNOSIS — Z510 Encounter for antineoplastic radiation therapy: Secondary | ICD-10-CM | POA: Insufficient documentation

## 2017-06-23 DIAGNOSIS — C155 Malignant neoplasm of lower third of esophagus: Secondary | ICD-10-CM | POA: Insufficient documentation

## 2017-06-23 DIAGNOSIS — Z5111 Encounter for antineoplastic chemotherapy: Secondary | ICD-10-CM | POA: Insufficient documentation

## 2017-06-23 NOTE — Assessment & Plan Note (Addendum)
1.  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 - Barium swallow done at [facility] showed a large mass in the distal esophagus. - PET/CT scan on 10/02/7827 hypermetabolic solid 3.5 x 2.3 cm esophageal mass in the mid to lower third of the thoracic esophagus with max SUV 7.3 and nonspecific mildly hypermetabolic nonenlarged bilateral hilar lymph nodes, indeterminate.  We discussed this with the patient in detail. - EGD on 05/26/2017 shows large fungating mass with no bleeding, 30 cm from incisors, extends to 35 cm, partially obstructing, biopsy consistent with squamous cell carcinoma. -Patient currently can eat only soft foods and liquids.  Has lost about 8 pounds in the last 6 months.  Drinks about 3 ensures per day.  We have encouraged him to change Ensure to Ensure Plus, so that he can get more calories. - Patient seen by Dr. Lisbeth Renshaw at [facility].  Refused surgery.  Hence endoscopic ultrasound procedure was suspended.  Also refused PEG tube placement. -Reportedly starting radiation on Monday.  We will have a port placed for chemotherapy administration.  We talked about starting him on weekly carboplatin and paclitaxel next week.  We talked about the side effects in detail including but not limited to allergic reactions, cytopenias, peripheral neuropathy, nausea, vomiting, increased risk for infections.  Our chemotherapy nurse will give him further education.  We will see him once a week prior to each treatment.  We will plan for at least 5 weekly treatments.  2.  Colorectal cancer: - Underwent segmental colon resection in November 2016, found to have a T3N0 adenocarcinoma, with circumferential margins positive.  Patient did not receive any neoadjuvant or adjuvant chemotherapy/radiation therapy.  3.  Prostate cancer: -Originally diagnosed in early 2000s, status post seed implants.  Underwent cystoprostatectomy in November 2016,  pathology showed prostatic adenocarcinoma Gleason 8 (4+4).

## 2017-06-23 NOTE — Progress Notes (Signed)
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  CURRENT THERAPY: Therapy being planned.  BRIEF ONCOLOGIC HISTORY:    Prostate cancer ([place])   12/11/2014 Procedure    Prostatectomy at time of rectal ca resection      12/17/2014 Pathology Results    PROSTATE: PROSTATIC ADENOCARCINOMA WITHOUT TREATMENT EFFECT, GLEASON SCORE 4+4=8 THE TUMOR LOCATED AT THE LEFT ANTERIOR APEX AND THE MID MARGIN OF RESECTION ARE POSITIVE AT THE LEFT ANTERIOR APEX (0.4 CM, GLEASON SCORE 4+4=8) OTHER MARGINS ARE NEGATIVE FOR TUMOR       Rectal cancer ([place])   10/02/2014 Procedure    Colonoscopy by Dr. Carlean Purl- One-third circumferential medium mass was found in the distal rectum; ulcerated, firm anterior over prostate bed, multiple biopsies were performed using cold forceps- suspect carcinoma ? rectal vs recurrence of prostate      10/04/2014 Pathology Results    Rectum, biopsy ulcerated mass-invasive adenocarcinoma consistent with colorectal origin.  CDX 2 positive.  PSA negative.      10/07/2014 Initial Diagnosis    Rectal cancer ([place])      10/08/2014 Imaging    CT CAP- 1. Wall thickening of the rectum compatible with recently diagnosed rectal carcinoma. Tiny subcentimeter perirectal lymph nodes. 2. Multiple bilateral pulmonary nodules as above. While these may be infectious or inflammatory in etiology, metastatic disease in the setting of known rectal carcinoma is not excluded. 3. Nodular soft tissue within the left mainstem bronchus which is nonspecific and may represent mucus however endobronchial nodule is not excluded. Recommend either attention on followup or correlation with bronchoscopy if the patient is having respiratory symptoms. 4. No evidence for focal  hepatic lesion. 5. Bilateral low-attenuation renal lesions with internal densities greater than that of fluid, indeterminate. Recommend attention on followup or definitive characterization with pre and post contrast-enhanced MRI.      10/10/2014 Procedure    EUS by Dr. Ardis Hughs- Non-circumferential, 3.4cm [phone] (Stage IIIB) rectal adenocarcinoma along the anterior wall of the distal rectum with distal edge 1-2cm from the anal verge.      12/11/2014 Procedure    Segmental resection for tumor by Dr. Leighton Ruff      78/24/2353 Pathology Results    Colon, segmental resection for tumor, Bladder and Prostate COLON: INVASIVE COLONIC ADENOCARCINOMA (3.5 CM IN GREATEST DIMENSION) THE CARCINOMA INVASIVE THROUGH THE MUSCULARIS PROPRIA AND PRESENTS AT THE ANTERIOR MESORECTUM RESECTION MARGIN (1.5 CM, R2) THE PROXIMAL AND DISTAL RESECTION MARGINS ARE NEGATIVE FOR TUMOR LYMPHOVASCULAR INVASION IS IDENTIFED ELEVEN BENIGN LYMPH NODES (0/11) PROSTATE: PROSTATIC ADENOCARCINOMA WITHOUT TREATMENT EFFECT, GLEASON SCORE 4+4=8 THE TUMOR LOCATED AT THE LEFT ANTERIOR APEX AND THE MID MARGIN OF RESECTION ARE POSITIVE AT THE LEFT ANTERIOR APEX (0.4 CM, GLEASON SCORE 4+4=8) OTHER MARGINS ARE NEGATIVE FOR TUMOR BLADDER: BENIGN UROTHELIUM AND SUBCUTANOUS TISSUE NEGATIVE FOR MALIGNANCY      06/20/2015 Imaging    CT CAP- 1. Interval cystoprostatectomy, urinary diversion, abdominal perineal resection and sigmoid colostomy. 2. No evidence of abdominal pelvic metastatic disease. 3. Stable small pulmonary nodules bilaterally. Based on stability over nearly 9 months, these are likely benign. Attention on continued follow-up recommended. 4. Stable low-density renal lesions bilaterally. 5. Moderate atherosclerosis.      12/31/2015 Procedure    Colonoscopy by Dr. Heron Sabins and colostomy in the  sigmoid colon.  Diverticulosis in the left colon, examination otherwise normal.      06/21/2016 Imaging    CT  CAP- Stable exam. No definite evidence of recurrent or metastatic carcinoma.  Stable sub-cm noncalcified bilateral pulmonary nodules, most consistent with benign postinflammatory etiology. Recommend continued attention on follow-up imaging.  Cholelithiasis. No radiographic evidence of cholecystitis.  Aortic and coronary artery atherosclerosis.        CANCER STAGING: Cancer Staging Rectal cancer [facility]) Staging form: Colon and Rectum, AJCC 8th Edition - Pathologic stage from 05/31/2016: Stage IIA (pT3, pN0, cM0) - Signed by Baird Cancer, PA-C on 05/31/2016    INTERVAL HISTORY:  Mr. Ian Christensen 84 y.o. male returns for routine follow-up after seeing Dr. Lisbeth Renshaw at [facility].  He refused to have a PEG tube placement.  He also refused possible surgery.  Hence his EUS was canceled.  He is drinking up to 3 cans of boost per day.  He lost about 3 pounds from last visit.  Still not able to eat solid foods.  Is drinking soups and eating pudding.  Otherwise fairly active physically.    REVIEW OF SYSTEMS:  Review of Systems  HENT:   Positive for trouble swallowing.   Hematological: Bruises/bleeds easily.  All other systems reviewed and are negative.    PAST MEDICAL/SURGICAL HISTORY:  Past Medical History:  Diagnosis Date  . Arthritis   . Colostomy in place [facility])   . Esophageal cancer ([place])   . GERD (gastroesophageal reflux disease)   . History of colon polyps   . History of hiatal hernia   . 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 HTN (hypertension)    followed by pcp  . Hyperlipidemia   . Presence of urostomy ([place])   . Rectal cancer ([place]) 10/07/2014   no chemo or radiation/ bladder was removed also  . Tinnitus    per pt intermittant   . Wears glasses    Past Surgical History:  Procedure Laterality Date  . APPENDECTOMY  1955  . COLONOSCOPY    . CYSTO N/A  12/11/2014   Procedure:  CYSTO,  PROSTATECTOMY, CYSTECTOMY colon conduit, BILATERAL LYMPH NODE DISSECTION;  Surgeon: Alexis Frock, MD;  Location: [facility];  Service: Urology;  Laterality: N/A;  . EUS N/A 10/10/2014   Procedure: LOWER ENDOSCOPIC ULTRASOUND (EUS);  Surgeon: Milus Banister, MD;  Location: [facility];  Service: Endoscopy;  Laterality: N/A;  . HERNIA REPAIR    . INCISIONAL HERNIA REPAIR N/A 03/02/2017   Procedure: INCISIONAL HERNIA REPAIR WITH MESH;  Surgeon: Leighton Ruff, MD;  Location: [facility];  Service: General;  Laterality: N/A;  . INSERTION OF MESH  03/02/2017   Procedure: INSERTION OF MESH;  Surgeon: Leighton Ruff, MD;  Location: [facility];  Service: General;;  . LAPAROSCOPIC INTERNAL HERNIA REPAIR N/A 03/06/2017   Procedure: REOPENING OF RECENT LAPAROTOMY WITH CLOSURE OF INCISIONAL HERNIA WITH MESH;  Surgeon: Leighton Ruff, MD;  Location: [facility];  Service: General;  Laterality: N/A;  . RADIOACTIVE PROSTATE SEED IMPLANTS  02-06-1999  dr Jeffie Pollock  [facility]  . XI ROBOTIC ASSISTED LOWER ANTERIOR RESECTION N/A 12/11/2014   Procedure: XI ROBOTIC ASSISTED ABDOMINAL PERINEAL RESECTION OMENTOPLASTY;  Surgeon: Leighton Ruff, MD;  Location: [facility];  Service: General;  Laterality: N/A;     SOCIAL HISTORY:  Social History   Socioeconomic History  . Marital status: Widowed  Spouse name: Not on file  . Number of children: 2  . Years of education: Not on file  . Highest education level: Not on file  Occupational History  . Occupation: retired  Scientific laboratory technician  . Financial resource strain: Not very hard  . Food insecurity:    Worry: Never true    Inability: Never true  . Transportation needs:    Medical: No    Non-medical: No  Tobacco Use  . Smoking status: Never Smoker  . Smokeless tobacco: Never Used  Substance and Sexual Activity  . Alcohol use: Yes    Alcohol/week: 0.0 oz    Comment: seldom  . Drug use: No  . Sexual activity: Not Currently    Comment: Widowed  Lifestyle  .  Physical activity:    Days per week: 1 day    Minutes per session: 30 min  . Stress: Not at all  Relationships  . Social connections:    Talks on phone: Three times a week    Gets together: Three times a week    Attends religious service: More than 4 times per year    Active member of club or organization: Yes    Attends meetings of clubs or organizations: More than 4 times per year    Relationship status: Widowed  . Intimate partner violence:    Fear of current or ex partner: No    Emotionally abused: No    Physically abused: No    Forced sexual activity: No  Other Topics Concern  . Not on file  Social History Narrative  . Not on file    FAMILY HISTORY:  Family History  Problem Relation Age of Onset  . Diabetes Mother   . Heart disease Mother   . Heart disease Father   . Colon cancer Sister   . Prostate cancer Brother   . Esophageal cancer Neg Hx   . Rectal cancer Neg Hx   . Stomach cancer Neg Hx     CURRENT MEDICATIONS:  Outpatient Encounter Medications as of 06/23/2017  Medication Sig Note  . acetaminophen (TYLENOL) 500 MG tablet Take 1,000 mg by mouth every 8 (eight) hours as needed (for pain).    Marland Kitchen aspirin EC 81 MG tablet Take 162 mg by mouth at bedtime. Takes 30 minutes prior to Niacin 12/23/2015: Pt is taking 162 mg of Aspirin daily/JB  . cyanocobalamin 500 MCG tablet Take 500 mcg by mouth daily. Vitamin B-12   . losartan (COZAAR) 25 MG tablet Take 25 mg by mouth daily.   . niacin (NIASPAN) 500 MG CR tablet Take 500 mg by mouth every evening.    . Omega-3 Fatty Acids (FISH OIL) 1200 MG CAPS Take 1,200 mg by mouth daily.   Marland Kitchen omeprazole (PRILOSEC) 20 MG capsule Take 20 mg by mouth every evening.    . simvastatin (ZOCOR) 20 MG tablet Take 20 mg by mouth every evening.     Facility-Administered Encounter Medications as of 06/23/2017  Medication  . 0.9 %  sodium chloride infusion    ALLERGIES:  Allergies  Allergen Reactions  . Morphine And Related     Caused  hallucinations.  . Oxycodone     Caused hallucinations     PHYSICAL EXAM:  ECOG Performance status: 1  Vitals:   06/23/17 1044  BP: (!) 157/78  Pulse: 78  Resp: 16  Temp: 98.2 F (36.8 C)  SpO2: 98%   Filed Weights   06/23/17 1044  Weight: 178 lb (80.7 kg)  Physical Exam  Deferred. LABORATORY DATA:  I have reviewed the labs as listed.  CBC    Component Value Date/Time   WBC 10.7 (H) 03/06/2017 0528   RBC 4.18 (L) 03/06/2017 0528   HGB 13.3 03/06/2017 0528   HCT 39.3 03/06/2017 0528   PLT 190 03/06/2017 0528   MCV 94.0 03/06/2017 0528   MCH 31.8 03/06/2017 0528   MCHC 33.8 03/06/2017 0528   RDW 13.5 03/06/2017 0528   LYMPHSABS 1.8 03/06/2017 0528   MONOABS 1.0 03/06/2017 0528   EOSABS 0.6 03/06/2017 0528   BASOSABS 0.0 03/06/2017 0528   CMP Latest Ref Rng & Units 03/06/2017 03/05/2017 02/24/2017  Glucose 65 - 99 mg/dL 88 105(H) 95  BUN 6 - 20 mg/dL 25(H) 29(H) 23(H)  Creatinine 0.61 - 1.24 mg/dL 1.04 1.12 1.20  Sodium 135 - 145 mmol/L 138 136 138  Potassium 3.5 - 5.1 mmol/L 3.7 3.9 4.3  Chloride 101 - 111 mmol/L 107 103 108  CO2 22 - 32 mmol/L 24 24 25   Calcium 8.9 - 10.3 mg/dL 8.1(L) 8.9 9.3  Total Protein 6.5 - 8.1 g/dL - 6.7 -  Total Bilirubin 0.3 - 1.2 mg/dL - 1.1 -  Alkaline Phos 38 - 126 U/L - 74 -  AST 15 - 41 U/L - 36 -  ALT 17 - 63 U/L - 35 -       ASSESSMENT & PLAN:   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 - Barium swallow done at [facility] showed a large mass in the distal esophagus. - PET/CT scan on 9/60/4540 hypermetabolic solid 3.5 x 2.3 cm esophageal mass in the mid to lower third of the thoracic esophagus with max SUV 7.3 and nonspecific mildly hypermetabolic nonenlarged bilateral hilar lymph nodes, indeterminate.  We discussed this with the patient in detail. - EGD on 05/26/2017 shows large  fungating mass with no bleeding, 30 cm from incisors, extends to 35 cm, partially obstructing, biopsy consistent with squamous cell carcinoma. -Patient currently can eat only soft foods and liquids.  Has lost about 8 pounds in the last 6 months.  Drinks about 3 ensures per day. - Patient seen by Dr. Lisbeth Renshaw at [facility].  Refused surgery.  Hence endoscopic ultrasound procedure was suspended.  Also refused PEG tube placement. -Reportedly starting radiation on Monday.  We will have a port placed for chemotherapy administration.  We talked about starting him on weekly carboplatin and paclitaxel next week.  We talked about the side effects in detail including but not limited to allergic reactions, cytopenias, peripheral neuropathy, nausea, vomiting, increased risk for infections.  Our chemotherapy nurse will give him further education.  We will see him once a week prior to each treatment.  We will plan for at least 5 weekly treatments.  2.  Colorectal cancer: - Underwent segmental colon resection in November 2016, found to have a T3N0 adenocarcinoma, with circumferential margins positive.  Patient did not receive any neoadjuvant or adjuvant chemotherapy/radiation therapy.  3.  Prostate cancer: -Originally diagnosed in early 2000s, status post seed implants.  Underwent cystoprostatectomy in November 2016, pathology showed prostatic adenocarcinoma Gleason 8 (4+4).      Orders placed this encounter:  Orders Placed This Encounter  Procedures  . CBC with Differential  . Comprehensive metabolic panel      Derek Jack, MD [place] [phone]

## 2017-06-24 ENCOUNTER — Encounter (HOSPITAL_COMMUNITY): Payer: Self-pay

## 2017-06-24 MED ORDER — LIDOCAINE-PRILOCAINE 2.5-2.5 % EX CREA: TOPICAL_CREAM | CUTANEOUS | 3 refills | Status: DC

## 2017-06-24 MED ORDER — PROCHLORPERAZINE MALEATE 10 MG PO TABS: 10.0000 mg | ORAL_TABLET | Freq: Four times a day (QID) | ORAL | 2 refills | Status: AC | PRN

## 2017-06-24 NOTE — Patient Instructions (Addendum)
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 The goal for your treatment is CURE.    You will have the following premedications prior to receiving chemotherapy:  Premeds: Benadryl:  Help prevent an allergic-like reaction to the chemotherapy.  Pepcid: antihistamine - helps prevent an allergic-like reaction to the chemo. Aloxi - high powered nausea/vomiting prevention medication used for chemotherapy patients.  Dexamethasone - steroid - given to reduce the risk of you having an allergic type reaction to the chemotherapy. Dex can cause you to feel energized, nervous/anxious/jittery, make you have trouble sleeping, and/or make you feel hot/flushed in the face/neck and/or look pink/red in the face/neck. These side effects will pass as the Dex wears off. (takes 20 minutes to infuse)   POTENTIAL SIDE EFFECTS OF TREATMENT:  Carboplatin   About This Drug Carboplatin is a drug used to treat cancer. This drug is given in the vein (IV). This drug takes about 30 minutes to infuse.   Possible Side Effects (More Common) . Nausea and throwing up (vomiting). These symptoms may happen within a few hours after your treatment and may last up to 24 hours. Medicines are available to stop or lessen these side effects. . Bone marrow depression. This is a decrease in the number of white blood cells, red blood cells, and platelets. This may raise your risk of infection, make you tired and weak (fatigue), and raise your risk of bleeding. . Soreness of the mouth and throat. You may have red areas, white patches, or sores that hurt. . This drug may affect how your kidneys work. Your kidney function will be checked as needed. . Electrolyte changes. Your blood will be checked for electrolyte changes as needed.  Side Effects (Less Common) . Hair loss. Some patients lose their hair on the scalp and body. You may notice your hair thinning  seven to 14 days after getting this drug. . Effects on the nerves are called peripheral neuropathy. You may feel numbness, tingling, or pain in your hands and feet. It may be hard for you to button your clothes, open jars, or walk as usual. The effect on the nerves may get worse with more doses of the drug. These effects get better in some people after the drug is stopped but it does not get better in all people. . Loose bowel movements (diarrhea) that may last for several days . Decreased hearing or ringing in the ears . Changes in the way food and drinks taste . Changes in liver function. Your liver function will be checked as needed.  Allergic Reactions Serious allergic reactions including anaphylaxis are rare. While you are getting this drug in your vein (IV), tell your nurse right away if you have any of these symptoms of an allergic reaction: . Trouble catching your breath . Feeling like your tongue or throat are swelling . Feeling your heart beat quickly or in a not normal way (palpitations) . Feeling dizzy or lightheaded . Flushing, itching, rash, and/or hives  Treating Side Effects . Drink 6-8 cups of fluids each day unless your doctor has told you to limit your fluid intake due to some other health problem. A cup is 8 ounces of fluid. If you throw up or have loose bowel movements, you should drink more fluids so that you do not become dehydrated (lack water in the body from losing too much fluid). . Mouth care is very important. Your mouth care should consist of  routine, gentle cleaning of your teeth or dentures and rinsing your mouth with a mixture of 1/2 teaspoon of salt in 8 ounces of water or  teaspoon of baking soda in 8 ounces of water. This should be done at least after each meal and at bedtime. . If you have mouth sores, avoid mouthwash that has alcohol. Avoid alcohol and smoking because they can bother your mouth and throat. . If you have numbness and tingling in your hands  and feet, be careful when cooking, walking, and handling sharp objects and hot liquids. . Talk with your nurse about getting a wig before you lose your hair. Also, call the [place] at 800-ACS-2345 to find out information about the "Look Good, Feel Better" program close to where you live. It is a free program where women getting chemotherapy can learn about wigs, turbans and scarves as well as makeup techniques and skin and nail care.  Food and Drug Interactions There are no known interactions of carboplatin with food. This drug may interact with other medicines. Tell your doctor and pharmacist about all the medicines and dietary supplements (vitamins, minerals, herbs and others) that you are taking at this time. The safety and use of dietary supplements and alternative diets are often not known. Using these might affect your cancer or interfere with your treatment. Until more is known, you should not use dietary supplements or alternative diets without your doctor's help.  When to Call the Doctor Call your doctor or nurse right away if you have any of these symptoms: . Fever of 100.5 F (38 C) or above; chills . Bleeding or bruising that is not normal . Wheezing or trouble breathing . Nausea that stops you from eating or drinking . Throwing up more than once a day . Rash or itching . Loose bowel movements (diarrhea) more than four times a day or diarrhea with weakness or feeling lightheaded . Call your doctor or nurse as soon as possible if any of these symptoms happen: . Numbness, tingling, decreased feeling or weakness in fingers, toes, arms, or legs . Change in hearing, ringing in the ears . Blurred vision or other changes in eyesight . Decreased urine . Yellowing of skin or eyes  Problems and Reproductive Concerns Sexual problems and reproduction concerns may happen. In both men and women, this drug may affect your ability to have children. This cannot be determined  before your treatment. Talk with your doctor or nurse if you plan to have children. Ask for information on sperm or egg banking. In men, this drug may interfere with your ability to make sperm, but it should not change your ability to have sexual relations. In women, menstrual bleeding may become irregular or stop while you are getting this drug. Do not assume that you cannot become pregnant if you do not have a menstrual period. Women may go through signs of menopause (change of life) like vaginal dryness or itching. Vaginal lubricants can be used to lessen vaginal dryness, itching, and pain during sexual relations.   Paclitaxel (Taxol)  Paclitaxel is a drug used to treat cancer. It is given in the vein (IV).  This will take 3 hours to infuse.  The first time this drug is given it will take longer to infuse because it is increased slowly to monitor for reactions.  The nurse will be in the room with you for the first 15 minutes.   Possible Side Effects . Hair loss. Hair loss is often temporary,  although with certain medicine, hair loss can sometimes be permanent. Hair loss may happen suddenly or gradually. If you lose hair, you may lose it from your head, face, armpits, pubic area, chest, and/or legs. You may also notice your hair getting thin. . Swelling of your legs, ankles and/or feet (edema) . Flushing . Nausea and throwing up (vomiting) . Loose bowel movements (diarrhea) . Bone marrow depression. This is a decrease in the number of white blood cells, red blood cells, and platelets. This may raise your risk of infection, make you tired and weak (fatigue), and raise your risk of bleeding. . Effects on the nerves are called peripheral neuropathy. You may feel numbness, tingling, or pain in your hands and feet. It may be hard for you to button your clothes, open jars, or walk as usual. The effect on the nerves may get worse with more doses of the drug. These effects get better in some people after  the drug is stopped but it does not get better in all people. . Changes in your liver function . Bone, joint and muscle pain . Abnormal EKG . Allergic reaction: Allergic reactions, including anaphylaxis are rare but may happen in some patients. Signs of allergic reaction to this drug may be swelling of the face, feeling like your tongue or throat are swelling, trouble breathing, rash, itching, fever, chills, feeling dizzy, and/or feeling that your heart is beating in a fast or not normal way. If this happens, do not take another dose of this drug. You should get urgent medical treatment. . Infection . Changes in your kidney function. Note: Each of the side effects above was reported in 20% or greater of patients treated with paclitaxel. Not all possible side effects are included above.  Warnings and Precautions . Severe bone marrow depression   Side Effects . To help with hair loss, wash with a mild shampoo and avoid washing your hair every day. . Avoid rubbing your scalp, instead, pat your hair or scalp dry . Avoid coloring your hair . Limit your use of hair spray, electric curlers, blow dryers, and curling irons. . To help with nausea and vomiting, eat small, frequent meals instead of three large meals a day. Choose foods and drinks that are at room temperature. Ask your nurse or doctor about other helpful tips and medicine that is available to help or stop lessen these symptoms. . If you get diarrhea, eat low-fiber foods that are high in protein and calories and avoid foods that can irritate your digestive tracts or lead to cramping. Ask your nurse or doctor about medicine that can lessen or stop your diarrhea. . Mouth care is very important. Your mouth care should consist of routine, gentle cleaning of your teeth or dentures and rinsing your mouth with a mixture of 1/2 teaspoon of salt in 8 ounces of water or  teaspoon of baking soda in 8 ounces of water. This should be done at least  after each meal and at bedtime. . If you have mouth sores, avoid mouthwash that has alcohol. Also avoid alcohol and smoking because they can bother your mouth and throat. . Drink plenty of fluids (a minimum of eight glasses per day is recommended). . Take your temperature as your doctor or nurse tells you, and whenever you feel like you may have a fever. . Talk to your doctor or nurse about precautions you can take to avoid infections and bleeding. . Be careful when cooking, walking, and handling sharp objects  and hot liquids.  Food and Drug Interactions . There are no known interactions of paclitaxel with food. . This drug may interact with other medicines. Tell your doctor and pharmacist about all the medicines and dietary supplements (vitamins, minerals, herbs and others) that you are taking at this time. . The safety and use of dietary supplements and alternative diets are often not known. Using these might affect your cancer or interfere with your treatment. Until more is known, you should not use dietary supplements or alternative diets without your cancer doctor's help.  When to Call the Doctor Call your doctor or nurse if you have any of the following symptoms and/or any new or unusual symptoms: . Fever of 100.5 F (38 C) or above . Chills . Redness, pain, warmth, or swelling at the IV site during the infusion . Signs of allergic reaction: swelling of the face, feeling like your tongue or throat are swelling, trouble breathing, rash, itching, fever, chills, feeling dizzy, and/or feeling that your heart is beating in a fast or not normal way . Feeling that your heart is beating in a fast or not normal way (palpitations) . Weight gain of 5 pounds in one week (fluid retention) . Decreased urine or very dark urine . Signs of liver problems: dark urine, pale bowel movements, bad stomach pain, feeling very tired and weak, unusual itching, or yellowing of the eyes or skin . Heavy menstrual  period that lasts longer than normal . Easy bruising or bleeding . Nausea that stops you from eating or drinking, and/or that is not relieved by prescribed medicines. . Loose bowel movements (diarrhea) more than 4 times a day or diarrhea with weakness or lightheadedness . Pain in your mouth or throat that makes it hard to eat or drink . Lasting loss of appetite or rapid weight loss of five pounds in a week . Signs of peripheral neuropathy: numbness, tingling, or decreased feeling in fingers or toes; trouble walking or changes in the way you walk; or feeling clumsy when buttoning clothes, opening jars, or other routine activities . Joint and muscle pain that is not relieved by prescribed medicines . Extreme fatigue that interferes with normal activities . While you are getting this drug, please tell your nurse right away if you have any pain, redness, or swelling at the site of the IV infusion. . If you think you are pregnant.    SELF CARE ACTIVITIES WHILE ON CHEMOTHERAPY:  Hydration Increase your fluid intake 48 hours prior to treatment and drink at least 8 to 12 cups (64 ounces) of water/decaff beverages per day after treatment. You can still have your cup of coffee or soda but these beverages do not count as part of your 8 to 12 cups that you need to drink daily. No alcohol intake.  Medications Continue taking your normal prescription medication as prescribed.  If you start any new herbal or new supplements please let us know first to make sure it is safe.  Mouth Care Have teeth cleaned professionally before starting treatment. Keep dentures and partial plates clean. Use soft toothbrush and do not use mouthwashes that contain alcohol. Biotene is a good mouthwash that is available at most pharmacies or may be ordered by calling [phone]. Use warm salt water gargles (1 teaspoon salt per 1 quart warm water) before and after meals and at bedtime. Or you may rinse with 2 tablespoons of  three-percent hydrogen peroxide mixed in eight ounces of water. If you are still  having problems with your mouth or sores in your mouth please call the clinic. If you need dental work, please let the doctor know before you go for your appointment so that we can coordinate the best possible time for you in regards to your chemo regimen. You need to also let your dentist know that you are actively taking chemo. We may need to do labs prior to your dental appointment.  Skin Care Always use sunscreen that has not expired and with SPF (Sun Protection Factor) of 50 or higher. Wear hats to protect your head from the sun. Remember to use sunscreen on your hands, ears, face, & feet.  Use good moisturizing lotions such as udder cream, eucerin, or even Vaseline. Some chemotherapies can cause dry skin, color changes in your skin and nails.     Avoid long, hot showers or baths.  Use gentle, fragrance-free soaps and laundry detergent.  Use moisturizers, preferably creams or ointments rather than lotions because the thicker consistency is better at preventing skin dehydration. Apply the cream or ointment within 15 minutes of showering. Reapply moisturizer at night, and moisturize your hands every time after you wash them.  Hair Loss (if your doctor says your hair will fall out)   If your doctor says that your hair is likely to fall out, decide before you begin chemo whether you want to wear a wig. You may want to shop before treatment to match your hair color.  Hats, turbans, and scarves can also camouflage hair loss, although some people prefer to leave their heads uncovered. If you go bare-headed outdoors, be sure to use sunscreen on your scalp.  Cut your hair short. It eases the inconvenience of shedding lots of hair, but it also can reduce the emotional impact of watching your hair fall out.  Don't perm or color your hair during chemotherapy. Those chemical treatments are already damaging to hair and  can enhance hair loss. Once your chemo treatments are done and your hair has grown back, it's OK to resume dyeing or perming hair. With chemotherapy, hair loss is almost always temporary. But when it grows back, it may be a different color or texture. In older adults who still had hair color before chemotherapy, the new growth may be completely gray.  Often, new hair is very fine and soft.  Infection Prevention Please wash your hands for at least 30 seconds using warm soapy water. Handwashing is the #1 way to prevent the spread of germs. Stay away from sick people or people who are getting over a cold. If you develop respiratory systems such as green/yellow mucus production or productive cough or persistent cough let us know and we will see if you need an antibiotic. It is a good idea to keep a pair of gloves on when going into grocery stores/[employer] to decrease your risk of coming into contact with germs on the carts, etc. Carry alcohol hand gel with you at all times and use it frequently if out in public. If your temperature reaches 100.5 or higher please call the clinic and let us know.  If it is after hours or on the weekend please go to the ER if your temperature is over 100.5.  Please have your own personal thermometer at home to use.    Sex and bodily fluids If you are going to have sex, a condom must be used to protect the person that isn't taking chemotherapy. Chemo can decrease your libido (sex drive). For a few  days after chemotherapy, chemotherapy can be excreted through your bodily fluids.  When using the toilet please close the lid and flush the toilet twice.  Do this for a few day after you have had chemotherapy.   Effects of chemotherapy on your sex life Some changes are simple and won't last long. They won't affect your sex life permanently. Sometimes you may feel:  too tired  not strong enough to be very active  sick or sore   not in the mood  anxious or low Your anxiety  might not seem related to sex. For example, you may be worried about the cancer and how your treatment is going. Or you may be worried about money, or about how you family are coping with your illness. These things can cause stress, which can affect your interest in sex. It's important to talk to your partner about how you feel. Remember - the changes to your sex life don't usually last long. There's usually no medical reason to stop having sex during chemo. The drugs won't have any long term physical effects on your performance or enjoyment of sex. Cancer can't be passed on to your partner during sex  Contraception It's important to use reliable contraception during treatment. Avoid getting pregnant while you or your partner are having chemotherapy. This isbecause the drugs may harm the baby. Sometimes chemotherapy drugs can leave a man or woman infertile.  This means you would not be able to have children in the future. You might want to talk to someone about permanent infertility. It can be very difficult to learn that you may no longer be able to have children. Some people find counselling helpful. There might be ways to preserve your fertility, although this is easier for men than for women. You may want to speak to a fertility expert. You can talk about sperm banking or harvesting your eggs. You can also ask about other fertility options, such as donor eggs. If you haveor have had breast cancer, your doctor might advise you not to take the contraceptive pill. This is because the hormones in it might affect the cancer. It is not known for sure whether or not chemotherapy drugs can be passed on through semen or secretions from the vagina. Because of this some doctors advise people to use a barrier method if you have sex during treatment. This applies to vaginal, anal or oral sex. Generally, doctors advise a barrier method only for the time you are actually having the treatment and for about a week  after your treatment. Advice like this can be worrying, but this does not mean that you have to avoid being intimate with your partner. You can still have close contact with your partner and continue to enjoy sex.     Animals If you have cats or birds we just ask that you not change the litter or change the cage.  Please have someone else do this for you while you are on chemotherapy.   Food Safety During and After Cancer Treatment Food safety is important for people both during and after cancer treatment. Cancer and cancer treatments, such as chemotherapy, radiation therapy, and stem cell/bone marrow transplantation, often weaken the immune system. This makes it harder for your body to protect itself from foodborne illness, also called food poisoning. Foodborne illness is caused by eating food that contains harmful bacteria, parasites, or viruses.  Foods to avoid Some foods have a higher risk of becoming tainted with bacteria. These include:  Unwashed fresh fruit and vegetables, especially leafy vegetables that can hide dirt and other contaminants  Raw sprouts, such as alfalfa sprouts  Raw or undercooked beef, especially ground beef, or other raw or undercooked meat and poultry  Fatty, fried, or spicy foods immediately before or after treatment.  These can sit heavy on your stomach and make you feel nauseous.  Raw or undercooked shellfish, such as oysters.  Sushi and sashimi, which often contain raw fish.   Unpasteurized beverages, such as unpasteurized fruit juices, raw milk, raw yogurt, or cider  Undercooked eggs, such as soft boiled, over easy, and poached; raw, unpasteurized eggs; or foods made with raw egg, such as homemade raw cookie dough and homemade mayonnaise   Simple steps for food safety Shop smart.  Do not buy food stored or displayed in an unclean area.  Do not buy bruised or damaged fruits or vegetables.  Do not buy cans that have cracks, dents, or  bulges.  Pick up foods that can spoil at the end of your shopping trip and store them in a cooler on the way home. Prepare and clean up foods carefully.  Rinse all fresh fruits and vegetables under running water, and dry them with a clean towel or paper towel.  Clean the top of cans before opening them.  After preparing food, wash your hands for 20 seconds with hot water and soap. Pay special attention to areas between fingers and under nails.  Clean your utensils and dishes with hot water and soap.  Disinfect your kitchen and cutting boards using 1 teaspoon of liquid, unscented bleach mixed into 1 quart of water.  Dispose of old food.  Eat canned and packaged food before its expiration date (the "use by" or "best before" date).  Consume refrigerated leftovers within 3 to 4 days. After that time, throw out the food. Even if the food does not smell or look spoiled, it still may be unsafe. Some bacteria, such as Listeria, can grow even on foods stored in the refrigerator if they are kept for too long. Take precautions when eating out.  At restaurants, avoid buffets and salad bars where food sits out for a long time and comes in contact with many people. Food can become contaminated when someone with a virus, often a norovirus, or another "bug" handles it.  Put any leftover food in a "to-go" container yourself, rather than having the server do it. And, refrigerate leftovers as soon as you get home.  Choose restaurants that are clean and that are willing to prepare your food as you order it cooked.    MEDICATIONS:                                           Compazine/Prochlorperazine 10mg  tablet. Take 1 tablet every 6 hours as needed for nausea/vomiting. (#2 nausea med to take, this can make you sleepy)  EMLA cream. Apply a quarter size amount to port site 1 hour prior to chemo. Do not rub in. Cover with plastic wrap.   Over-the-Counter Meds:  Miralax 1 capful in 8 oz of fluid  daily. May increase to two times a day if needed. This is a stool softener. If this doesn't work proceed you can add:  Senokot S-start with 1 tablet two times a day and increase to 4 tablets two times a day if needed. (total of 8 tablets  in a 24 hour period). This is a stimulant laxative.   Call us if this does not help your bowels move.   Imodium 2mg  capsule. Take 2 capsules after the 1st loose stool and then 1 capsule every 2 hours until you go a total of 12 hours without having a loose stool. Call the [facility] if loose stools continue. If diarrhea occurs @ bedtime, take 2 capsules @ bedtime. Then take 2 capsules every 4 hours until morning. Call [facility].    Diarrhea Sheet  If you are having loose stools/diarrhea, please purchase Imodium and begin taking as outlined:  At the first sign of poorly formed or loose stools you should begin taking Imodium(loperamide) 2 mg capsules.  Take two caplets (4mg ) followed by one caplet (2mg ) every 2 hours until you have had no diarrhea for 12 hours.  During the night take two caplets (4mg ) at bedtime and continue every 4 hours during the night until the morning.  Stop taking Imodium only after there is no sign of diarrhea for 12 hours.    Always call the [facility] if you are having loose stools/diarrhea that you can't get under control.  Loose stools/diarrhea leads to dehydration (loss of water) in your body.  We have other options of trying to get the loose stools/diarrhea to stopped but you must let us know!   Constipation Sheet *Miralax in 8 oz of fluid daily.  May increase to two times a day if needed.  This is a stool softener.  If this not enough to keep your bowel regular:  You can add:  *Senokot S, start with one tablet twice a day and can increase to 4 tablets twice a day if needed.  This is a stimulant laxative.   Sometimes when you take pain medication you need BOTH a medicine to keep your stool soft and a medicine  to help your bowel push it out!  Please call if the above does not work for you.   Do not go more than 2 days without a bowel movement.  It is very important that you do not become constipated.  It will make you feel sick to your stomach (nausea) and can cause abdominal pain and vomiting.   Nausea Sheet   Compazine/Prochlorperazine 10mg  tablet. Take 1 tablet every 6 hours as needed for nausea/vomiting. (can make you sleepy)  Please call the [facility] and let us know the amount of nausea that you are experiencing.  If you begin to vomit, you need to call the [facility] and if it is the weekend and you have vomited more than one time and can't get it to stop - go to the Emergency Room.  Persistent nausea/vomiting can lead to dehydration (loss of fluid in your body) and will make you feel terrible.   Ice chips, sips of clear liquids, foods that are @ room temperature, crackers, and toast tend to be better tolerated.     SYMPTOMS TO REPORT AS SOON AS POSSIBLE AFTER TREATMENT:  FEVER GREATER THAN 100.5 F  CHILLS WITH OR WITHOUT FEVER  NAUSEA AND VOMITING THAT IS NOT CONTROLLED WITH YOUR NAUSEA MEDICATION  UNUSUAL SHORTNESS OF BREATH  UNUSUAL BRUISING OR BLEEDING  TENDERNESS IN MOUTH AND THROAT WITH OR WITHOUT PRESENCE OF ULCERS  URINARY PROBLEMS  BOWEL PROBLEMS  UNUSUAL RASH      Wear comfortable clothing and clothing appropriate for easy access to any Portacath or PICC line. Let us know if there is anything  that we can do to make your therapy better!    What to do if you need assistance after hours or on the weekends: CALL [phone].  HOLD on the line, do not hang up.  You will hear multiple messages but at the end you will be connected with a nurse triage line.  They will contact the doctor if necessary.  Most of the time they will be able to assist you.  Do not call the hospital operator.    I have been informed and understand all of the  instructions given to me and have received a copy. I have been instructed to call the clinic [phone] or my family physician as soon as possible for continued medical care, if indicated. I do not have any more questions at this time but understand that I may call the [facility] or the Patient Navigator at [phone] during office hours should I have questions or need assistance in obtaining follow-up care.  [facility] Discharge Instructions for Patients Receiving Chemotherapy   Beginning January 23rd 2017 lab work for the [facility] will be done in the  Main lab at [facility] on 1st floor. If you have a lab appointment with the [facility] please come in thru the  Main Entrance and check in at the main information desk   Today you received the following chemotherapy agents Taxol and Carboplatin. Follow-up as scheduled. Call clinic for any questions or concerns  To help prevent nausea and vomiting after your treatment, we encourage you to take your nausea medication   If you develop nausea and vomiting, or diarrhea that is not controlled by your medication, call the clinic.  The clinic phone number is [phone]. Office hours are Monday-Friday 8:30am-5:00pm.  BELOW ARE SYMPTOMS THAT SHOULD BE REPORTED IMMEDIATELY:  *FEVER GREATER THAN 101.0 F  *CHILLS WITH OR WITHOUT FEVER  NAUSEA AND VOMITING THAT IS NOT CONTROLLED WITH YOUR NAUSEA MEDICATION  *UNUSUAL SHORTNESS OF BREATH  *UNUSUAL BRUISING OR BLEEDING  TENDERNESS IN MOUTH AND THROAT WITH OR WITHOUT PRESENCE OF ULCERS  *URINARY PROBLEMS  *BOWEL PROBLEMS  UNUSUAL RASH Items with * indicate a potential emergency and should be followed up as soon as possible. If you have an emergency after office hours please contact your primary care physician or go to the nearest emergency department.  Please call the clinic during office hours if you have any questions or concerns.   You may also contact  the Patient Navigator at [phone] should you have any questions or need assistance in obtaining follow up care.      Resources For Cancer Patients and their Caregivers ? American Cancer Society: Can assist with transportation, wigs, general needs, runs Look Good Feel Better.        ([phone] ? Cancer Care: Provides financial assistance, online support groups, medication/co-pay assistance.  1-800-813-HOPE 224-352-3199) ? [place] Assists [place] cancer patients and their families through emotional , educational and financial support.  8258205961 ? [place] DSS Where to apply for food stamps, Medicaid and utility assistance. 520-197-0296 ? [facility]: Transportation to medical appointments. 240-267-9035 ? Social Security Administration: May apply for disability if have a Stage IV cancer. 442 389 4797 289-393-1371 ? [employer], Disability and Transit Services: Assists with nutrition, care and transit needs. 4087653219

## 2017-06-24 NOTE — Progress Notes (Signed)
Nutrition Assessment   Reason for Assessment:   Patient identified on Malnutrition screening report with weight loss and poor appetite.    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 Past medical history of prostate cancer, rectal cancer with colostomy, GERD, HTN.  Chart reviewed and noted planning chemotherapy and radiation therapy.  Patient has declined feeding tube placement and not surgical candidate.   Called and spoke with patient via phone.  Introduced self and nutrition service at the cancer center.  Patient's daughter is System Warehouse manager at [employer].  Patient reports that he lives alone and does most of grocery shopping and cooking. Has been eating soft liquid foods (soups, pudding, soft fruits). Has been drinking 3 ensure enlive daily for extra calories and protein.     No other nutrition impact symptoms reported today.   Nutrition Focused Physical Exam: deferred  Medications: Vit B12, niacin, omega 3, prilosec  Labs: no recent labs  Anthropometrics:   Height: 67 inches Weight: 178 lb UBW: 190 lb per patient. Noted 196 lb on 02/10/17 BMI: 27  9% weight loss in the last 5 months, not significant  Estimated Energy Needs  Kcals: 2025-2400 calories/d Protein: 101-120 g/d Fluid: 2.4 L/d  NUTRITION DIAGNOSIS: Inadequate oral intake related to esophageal mass as evidenced by 9% weight loss in 5 months and eating soft liquid foods   MALNUTRITION DIAGNOSIS: continue to monitor   INTERVENTION:  Discussed importance of nutrition with patient via phone during treatment. Discussed chopping, grinding, blending foods.  Patient reports does not currently have a blender but could purchase one.   Discussed foods that have high calories and protein that are soft and moist.   Patient agreeable for materials to be provided.  Will leave at check-in for patient to get on Monday, June 10  (infusion) Encouraged patient to increase ensure enlive to 4 per day.  1st complimentary case of ensure left at check in for patient to get on Monday. Contact information given over the phone but will leave business card as well. Offered nutrition appointment either at [facility] or [facility] (receiving radiation)    MONITORING, EVALUATION, GOAL: Patient will consume adequate calories and protein to maintain weight during treatment   NEXT VISIT: as needed  Pragya Lofaso B. Zenia Resides, [place], [place] Registered Dietitian [phone] (pager)

## 2017-06-25 NOTE — Progress Notes (Signed)
START ON PATHWAY REGIMEN - Gastroesophageal     Administer weekly during RT:     Paclitaxel      Carboplatin   **Always confirm dose/schedule in your pharmacy ordering system**  Patient Characteristics: Esophageal & GE Junction, Squamous Cell, Preoperative or Nonsurgical Candidate (Clinical Staging), cT3 or Higher or cN+, Unresectable/Nonsurgical Candidate (Any cT) Histology: Squamous Cell Disease Classification: Esophageal Therapeutic Status: Preoperative or Nonsurgical Candidate (Clinical Staging) AJCC M Category: cM0 AJCC 8 Stage Grouping: Unknown AJCC Grade: GX AJCC Location: Lower AJCC T Category: cTX AJCC N Category: cNX Intent of Therapy: Curative Intent, Discussed with Patient

## 2017-06-27 ENCOUNTER — Ambulatory Visit
Admission: RE | Admit: 2017-06-27 | Discharge: 2017-06-27 | Disposition: A | Discharge status: Home / Self Care | Payer: PPO | Source: Ambulatory Visit | Attending: Radiation Oncology | Admitting: Radiation Oncology

## 2017-06-27 ENCOUNTER — Ambulatory Visit: Payer: PPO | Admitting: Radiation Oncology

## 2017-06-27 ENCOUNTER — Inpatient Hospital Stay (HOSPITAL_COMMUNITY): Payer: PPO

## 2017-06-27 ENCOUNTER — Encounter (HOSPITAL_COMMUNITY): Payer: Self-pay

## 2017-06-27 VITALS — BP 146/57 | HR 80 | Temp 97.6°F | Resp 18 | Wt 177.7 lb

## 2017-06-27 DIAGNOSIS — Z5111 Encounter for antineoplastic chemotherapy: Secondary | ICD-10-CM | POA: Diagnosis not present

## 2017-06-27 DIAGNOSIS — Z510 Encounter for antineoplastic radiation therapy: Secondary | ICD-10-CM | POA: Diagnosis not present

## 2017-06-27 DIAGNOSIS — C155 Malignant neoplasm of lower third of esophagus: Secondary | ICD-10-CM

## 2017-06-27 LAB — CBC WITH DIFFERENTIAL/PLATELET
Basophils Absolute: 0.1 10*3/uL (ref 0.0–0.1)
Basophils Relative: 1 %
EOS ABS: 0.3 10*3/uL (ref 0.0–0.7)
EOS PCT: 3 %
HCT: 44.4 % (ref 39.0–52.0)
Hemoglobin: 14 g/dL (ref 13.0–17.0)
LYMPHS PCT: 24 %
Lymphs Abs: 2.4 10*3/uL (ref 0.7–4.0)
MCH: 30.2 pg (ref 26.0–34.0)
MCHC: 31.5 g/dL (ref 30.0–36.0)
MCV: 95.7 fL (ref 78.0–100.0)
Monocytes Absolute: 0.6 10*3/uL (ref 0.1–1.0)
Monocytes Relative: 6 %
NEUTROS PCT: 66 %
Neutro Abs: 6.9 10*3/uL (ref 1.7–7.7)
PLATELETS: 257 10*3/uL (ref 150–400)
RBC: 4.64 MIL/uL (ref 4.22–5.81)
RDW: 14 % (ref 11.5–15.5)
WBC: 10.3 10*3/uL (ref 4.0–10.5)

## 2017-06-27 LAB — COMPREHENSIVE METABOLIC PANEL
ALK PHOS: 66 U/L (ref 38–126)
ALT: 14 U/L — AB (ref 17–63)
AST: 17 U/L (ref 15–41)
Albumin: 3.7 g/dL (ref 3.5–5.0)
Anion gap: 8 (ref 5–15)
BUN: 16 mg/dL (ref 6–20)
CALCIUM: 9.7 mg/dL (ref 8.9–10.3)
CHLORIDE: 106 mmol/L (ref 101–111)
CO2: 26 mmol/L (ref 22–32)
CREATININE: 1.01 mg/dL (ref 0.61–1.24)
GFR calc Af Amer: >60 mL/min (ref >60)
GFR calc non Af Amer: >60 mL/min (ref >60)
GLUCOSE: 144 mg/dL — AB (ref 65–99)
Potassium: 3.8 mmol/L (ref 3.5–5.1)
SODIUM: 140 mmol/L (ref 135–145)
Total Bilirubin: 0.7 mg/dL (ref 0.3–1.2)
Total Protein: 7.2 g/dL (ref 6.5–8.1)

## 2017-06-27 MED ORDER — SODIUM CHLORIDE 0.9 % IV SOLN
Freq: Once | INTRAVENOUS | Status: AC
  Administered 2017-06-27: 09:00:00 via INTRAVENOUS

## 2017-06-27 MED ORDER — PALONOSETRON HCL INJECTION 0.25 MG/5ML
0.2500 mg | Freq: Once | INTRAVENOUS | Status: AC
  Administered 2017-06-27: 0.25 mg via INTRAVENOUS

## 2017-06-27 MED ORDER — SODIUM CHLORIDE 0.9 % IV SOLN
150.0000 mg | Freq: Once | INTRAVENOUS | Status: AC
  Administered 2017-06-27: 150 mg via INTRAVENOUS
  Filled 2017-06-27: qty 15

## 2017-06-27 MED ORDER — SONAFINE EX EMUL
1.0000 "application " | Freq: Once | CUTANEOUS | Status: AC
  Administered 2017-06-27: 1 via TOPICAL

## 2017-06-27 MED ORDER — FAMOTIDINE IN NACL 20-0.9 MG/50ML-% IV SOLN
INTRAVENOUS | Status: AC
  Filled 2017-06-27: qty 50

## 2017-06-27 MED ORDER — DIPHENHYDRAMINE HCL 50 MG/ML IJ SOLN
50.0000 mg | Freq: Once | INTRAMUSCULAR | Status: AC
  Administered 2017-06-27: 50 mg via INTRAVENOUS

## 2017-06-27 MED ORDER — SODIUM CHLORIDE 0.9 % IV SOLN
20.0000 mg | Freq: Once | INTRAVENOUS | Status: AC
  Administered 2017-06-27: 20 mg via INTRAVENOUS
  Filled 2017-06-27: qty 2

## 2017-06-27 MED ORDER — SODIUM CHLORIDE 0.9 % IV SOLN
40.0000 mg/m2 | Freq: Once | INTRAVENOUS | Status: AC
  Administered 2017-06-27: 78 mg via INTRAVENOUS
  Filled 2017-06-27: qty 13

## 2017-06-27 MED ORDER — ACETAMINOPHEN 325 MG PO TABS
ORAL_TABLET | ORAL | Status: AC
  Filled 2017-06-27: qty 1

## 2017-06-27 MED ORDER — PALONOSETRON HCL INJECTION 0.25 MG/5ML
INTRAVENOUS | Status: AC
  Filled 2017-06-27: qty 5

## 2017-06-27 MED ORDER — ACETAMINOPHEN 325 MG PO TABS
650.0000 mg | ORAL_TABLET | Freq: Once | ORAL | Status: AC
  Administered 2017-06-27: 650 mg via ORAL

## 2017-06-27 MED ORDER — DIPHENHYDRAMINE HCL 50 MG/ML IJ SOLN
INTRAMUSCULAR | Status: AC
  Filled 2017-06-27: qty 1

## 2017-06-27 MED ORDER — COLD PACK MISC ONCOLOGY: 1.0000 | Freq: Once | Status: DC | PRN

## 2017-06-27 MED ORDER — ACETAMINOPHEN 325 MG PO TABS
ORAL_TABLET | ORAL | Status: AC
  Filled 2017-06-27: qty 2

## 2017-06-27 MED ORDER — FAMOTIDINE IN NACL 20-0.9 MG/50ML-% IV SOLN
20.0000 mg | Freq: Once | INTRAVENOUS | Status: AC
  Administered 2017-06-27: 20 mg via INTRAVENOUS

## 2017-06-27 MED ORDER — HEPARIN SOD (PORK) LOCK FLUSH 100 UNIT/ML IV SOLN
INTRAVENOUS | Status: AC
Start: 2017-06-27 — End: 2017-06-27
  Filled 2017-06-27: qty 5

## 2017-06-27 NOTE — Progress Notes (Signed)
63 Labs reviewed with Dr. Delton Coombes and pt approved for chemo tx today per MD                             Ian Christensen tolerated chemo tx well without complaints or incident VSS throughout tx and upon discharge. Pt and daughters given instructions regarding pre-meds, chemotherapy medications and numbers to call for any issues or questions and written information given as well for pt to have at home. Understanding was verbalized.Pt given 2 Tylenol for pain during tx which was effective. Pt discharged self ambulatory in satisfactory condition accompanied by his daughters

## 2017-06-27 NOTE — Progress Notes (Signed)
Pt here for patient teaching.  Pt given Radiation and You booklet, skin care instructions and Sonafine.  Reviewed areas of pertinence such as fatigue, hair loss, skin changes and throat changes . Pt able to give teach back of, apply Sonafine bid and avoid applying anything to skin within 4 hours of treatment. Pt verbalizes understanding of information given and will contact nursing with any questions or concerns.     Cori Razor, RN

## 2017-06-28 ENCOUNTER — Ambulatory Visit: Payer: PPO

## 2017-06-28 ENCOUNTER — Telehealth (HOSPITAL_COMMUNITY): Payer: Self-pay

## 2017-06-28 ENCOUNTER — Ambulatory Visit
Admission: RE | Admit: 2017-06-28 | Discharge: 2017-06-28 | Disposition: A | Discharge status: Home / Self Care | Payer: PPO | Source: Ambulatory Visit | Attending: Radiation Oncology | Admitting: Radiation Oncology

## 2017-06-28 ENCOUNTER — Encounter: Payer: Self-pay | Admitting: General Surgery

## 2017-06-28 ENCOUNTER — Ambulatory Visit (INDEPENDENT_AMBULATORY_CARE_PROVIDER_SITE_OTHER): Payer: PPO | Admitting: General Surgery

## 2017-06-28 VITALS — BP 158/71 | HR 87 | Temp 98.6°F | Resp 18 | Wt 180.0 lb

## 2017-06-28 DIAGNOSIS — C155 Malignant neoplasm of lower third of esophagus: Secondary | ICD-10-CM

## 2017-06-28 DIAGNOSIS — Z510 Encounter for antineoplastic radiation therapy: Secondary | ICD-10-CM | POA: Diagnosis not present

## 2017-06-28 NOTE — Telephone Encounter (Signed)
24 hour follow up call back-patient stated that he is doing great, no problems this morning. Told him to call for any concerns or questions that might come up.

## 2017-06-28 NOTE — H&P (Signed)
Ian Christensen; [id number]; 11-15-1931   HPI Patient is an 84 year old white male who was referred to my care by Dr. Ellin Saba for Port-A-Cath placement.  He has esophageal carcinoma and is about to undergo chemotherapy. Past Medical History:  Diagnosis Date  . Arthritis   . Colostomy in place [facility])   . Esophageal cancer (HCC)   . GERD (gastroesophageal reflux disease)   . History of colon polyps   . History of hiatal hernia   . 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 HTN (hypertension)    followed by pcp  . Hyperlipidemia   . Presence of urostomy (HCC)   . Rectal cancer (HCC) 10/07/2014   no chemo or radiation/ bladder was removed also  . Tinnitus    per pt intermittant   . Wears glasses     Past Surgical History:  Procedure Laterality Date  . APPENDECTOMY  1955  . COLONOSCOPY    . CYSTO N/A 12/11/2014   Procedure:  CYSTO,  PROSTATECTOMY, CYSTECTOMY colon conduit, BILATERAL LYMPH NODE DISSECTION;  Surgeon: Sebastian Ache, MD;  Location: [facility];  Service: Urology;  Laterality: N/A;  . EUS N/A 10/10/2014   Procedure: LOWER ENDOSCOPIC ULTRASOUND (EUS);  Surgeon: Rachael Fee, MD;  Location: [facility];  Service: Endoscopy;  Laterality: N/A;  . HERNIA REPAIR    . INCISIONAL HERNIA REPAIR N/A 03/02/2017   Procedure: INCISIONAL HERNIA REPAIR WITH MESH;  Surgeon: Romie Levee, MD;  Location: [facility];  Service: General;  Laterality: N/A;  . INSERTION OF MESH  03/02/2017   Procedure: INSERTION OF MESH;  Surgeon: Romie Levee, MD;  Location: [facility];  Service: General;;  . LAPAROSCOPIC INTERNAL HERNIA REPAIR N/A 03/06/2017   Procedure: REOPENING OF RECENT LAPAROTOMY WITH CLOSURE OF INCISIONAL HERNIA WITH MESH;  Surgeon: Romie Levee, MD;  Location: [facility];  Service: General;  Laterality: N/A;  . RADIOACTIVE PROSTATE SEED IMPLANTS  02-06-1999  dr Annabell Howells  [facility]  . XI ROBOTIC  ASSISTED LOWER ANTERIOR RESECTION N/A 12/11/2014   Procedure: XI ROBOTIC ASSISTED ABDOMINAL PERINEAL RESECTION OMENTOPLASTY;  Surgeon: Romie Levee, MD;  Location: [facility];  Service: General;  Laterality: N/A;    Family History  Problem Relation Age of Onset  . Diabetes Mother   . Heart disease Mother   . Heart disease Father   . Colon cancer Sister   . Prostate cancer Brother   . Esophageal cancer Neg Hx   . Rectal cancer Neg Hx   . Stomach cancer Neg Hx     Current Outpatient Medications on File Prior to Visit  Medication Sig Dispense Refill  . acetaminophen (TYLENOL) 500 MG tablet Take 1,000 mg by mouth every 8 (eight) hours as needed (for pain).     Marland Kitchen aspirin EC 81 MG tablet Take 162 mg by mouth at bedtime. Takes 30 minutes prior to Niacin    . lidocaine-prilocaine (EMLA) cream Apply a quarter size amount to port site 1 hour prior to chemo. Do not rub in. Cover with plastic wrap. 30 g 3  . losartan (COZAAR) 25 MG tablet Take 25 mg by mouth daily.    . niacin (NIASPAN) 500 MG CR tablet Take 500 mg by mouth every evening.     Marland Kitchen omeprazole (PRILOSEC) 20 MG capsule Take 20 mg by mouth every evening.     . prochlorperazine (COMPAZINE) 10 MG tablet Take 1  tablet (10 mg total) by mouth every 6 (six) hours as needed for nausea or vomiting. 30 tablet 2  . simvastatin (ZOCOR) 20 MG tablet Take 20 mg by mouth every evening.     . cyanocobalamin 500 MCG tablet Take 500 mcg by mouth daily. Vitamin B-12     Current Facility-Administered Medications on File Prior to Visit  Medication Dose Route Frequency Provider Last Rate Last Dose  . 0.9 %  sodium chloride infusion  500 mL Intravenous Once Iva Boop, MD        Allergies  Allergen Reactions  . Morphine And Related     Caused hallucinations.  . Oxycodone     Caused hallucinations    Social History   Substance and Sexual Activity  Alcohol Use Yes  . Alcohol/week: 0.0 oz   Comment: seldom    Social History   Tobacco Use   Smoking Status Never Smoker  Smokeless Tobacco Never Used    Review of Systems  Constitutional: Negative.   HENT: Negative.   Eyes: Negative.   Respiratory: Negative.   Cardiovascular: Negative.   Gastrointestinal: Positive for heartburn.  Genitourinary: Negative.   Musculoskeletal: Positive for back pain.  Skin: Negative.   Neurological: Negative.   Endo/Heme/Allergies: Negative.   Psychiatric/Behavioral: Negative.     Objective   Vitals:   06/28/17 1455  BP: (!) 158/71  Pulse: 87  Resp: 18  Temp: 98.6 F (37 C)    Physical Exam  Constitutional: He is oriented to person, place, and time. He appears well-developed and well-nourished. No distress.  HENT:  Head: Normocephalic and atraumatic.  Cardiovascular: Normal rate, regular rhythm and normal heart sounds. Exam reveals no gallop and no friction rub.  No murmur heard. Pulmonary/Chest: Effort normal and breath sounds normal. No stridor. No respiratory distress. He has no wheezes. He has no rales.  Neurological: He is alert and oriented to person, place, and time.  Skin: Skin is warm and dry.  Vitals reviewed.  Dr. Marice Potter notes reviewed Assessment  Esophageal carcinoma, need for central venous access Plan   Patient is scheduled for Port-A-Cath insertion on 07/05/2017.  The risks and benefits of the procedure including bleeding, infection, and pneumothorax were fully explained to the patient, who gave informed consent.

## 2017-06-28 NOTE — Patient Instructions (Signed)
Implanted Port Home Guide An implanted port is a type of central line that is placed under the skin. Central lines are used to provide IV access when treatment or nutrition needs to be given through a person's veins. Implanted ports are used for long-term IV access. An implanted port may be placed because:  You need IV medicine that would be irritating to the small veins in your hands or arms.  You need long-term IV medicines, such as antibiotics.  You need IV nutrition for a long period.  You need frequent blood draws for lab tests.  You need dialysis.  Implanted ports are usually placed in the chest area, but they can also be placed in the upper arm, the abdomen, or the leg. An implanted port has two main parts:  Reservoir. The reservoir is round and will appear as a small, raised area under your skin. The reservoir is the part where a needle is inserted to give medicines or draw blood.  Catheter. The catheter is a thin, flexible tube that extends from the reservoir. The catheter is placed into a large vein. Medicine that is inserted into the reservoir goes into the catheter and then into the vein.  How will I care for my incision site? Do not get the incision site wet. Bathe or shower as directed by your health care provider. How is my port accessed? Special steps must be taken to access the port:  Before the port is accessed, a numbing cream can be placed on the skin. This helps numb the skin over the port site.  Your health care provider uses a sterile technique to access the port. ? Your health care provider must put on a mask and sterile gloves. ? The skin over your port is cleaned carefully with an antiseptic and allowed to dry. ? The port is gently pinched between sterile gloves, and a needle is inserted into the port.  Only "non-coring" port needles should be used to access the port. Once the port is accessed, a blood return should be checked. This helps ensure that the port  is in the vein and is not clogged.  If your port needs to remain accessed for a constant infusion, a clear (transparent) bandage will be placed over the needle site. The bandage and needle will need to be changed every week, or as directed by your health care provider.  Keep the bandage covering the needle clean and dry. Do not get it wet. Follow your health care provider's instructions on how to take a shower or bath while the port is accessed.  If your port does not need to stay accessed, no bandage is needed over the port.  What is flushing? Flushing helps keep the port from getting clogged. Follow your health care provider's instructions on how and when to flush the port. Ports are usually flushed with saline solution or a medicine called heparin. The need for flushing will depend on how the port is used.  If the port is used for intermittent medicines or blood draws, the port will need to be flushed: ? After medicines have been given. ? After blood has been drawn. ? As part of routine maintenance.  If a constant infusion is running, the port may not need to be flushed.  How long will my port stay implanted? The port can stay in for as long as your health care provider thinks it is needed. When it is time for the port to come out, surgery will be   done to remove it. The procedure is similar to the one performed when the port was put in. When should I seek immediate medical care? When you have an implanted port, you should seek immediate medical care if:  You notice a bad smell coming from the incision site.  You have swelling, redness, or drainage at the incision site.  You have more swelling or pain at the port site or the surrounding area.  You have a fever that is not controlled with medicine.  This information is not intended to replace advice given to you by your health care provider. Make sure you discuss any questions you have with your health care provider. Document  Released: 01/04/2005 Document Revised: 06/12/2015 Document Reviewed: 09/11/2012 Elsevier Interactive Patient Education  2017 [employer] Implanted Port Insertion Implanted port insertion is a procedure to put in a port and catheter. The port is a device with an injectable disk that can be accessed by your health care provider. The port is connected to a vein in the chest or neck by a small flexible tube (catheter). There are different types of ports. The implanted port may be used as a long-term IV access for:  Medicines, such as chemotherapy.  Fluids.  Liquid nutrition, such as total parenteral nutrition (TPN).  Blood samples.  Having a port means that your health care provider will not need to use the veins in your arms for these procedures. Tell a health care provider about:  Any allergies you have.  All medicines you are taking, especially blood thinners, as well as any vitamins, herbs, eye drops, creams, over-the-counter medicines, and steroids.  Any problems you or family members have had with anesthetic medicines.  Any blood disorders you have.  Any surgeries you have had.  Any medical conditions you have, including diabetes or kidney problems.  Whether you are pregnant or may be pregnant. What are the risks? Generally, this is a safe procedure. However, problems may occur, including:  Allergic reactions to medicines or dyes.  Damage to other structures or organs.  Infection.  Damage to the blood vessel, bruising, or bleeding at the puncture site.  Blood clot.  Breakdown of the skin over the port.  A collection of air in the chest that can cause one of the lungs to collapse (pneumothorax). This is rare.  What happens before the procedure? Staying hydrated Follow instructions from your health care provider about hydration, which may include:  Up to 2 hours before the procedure - you may continue to drink clear liquids, such as water, clear fruit juice, black  coffee, and plain tea.  Eating and drinking restrictions  Follow instructions from your health care provider about eating and drinking, which may include: ? 8 hours before the procedure - stop eating heavy meals or foods such as meat, fried foods, or fatty foods. ? 6 hours before the procedure - stop eating light meals or foods, such as toast or cereal. ? 6 hours before the procedure - stop drinking milk or drinks that contain milk. ? 2 hours before the procedure - stop drinking clear liquids. Medicines  Ask your health care provider about: ? Changing or stopping your regular medicines. This is especially important if you are taking diabetes medicines or blood thinners. ? Taking medicines such as aspirin and ibuprofen. These medicines can thin your blood. Do not take these medicines before your procedure if your health care provider instructs you not to.  You may be given antibiotic medicine to help   prevent infection. General instructions  Plan to have someone take you home from the hospital or clinic.  If you will be going home right after the procedure, plan to have someone with you for 24 hours.  You may have blood tests.  You may be asked to shower with a germ-killing soap. What happens during the procedure?  To lower your risk of infection: ? Your health care team will wash or sanitize their hands. ? Your skin will be washed with soap. ? Hair may be removed from the surgical area.  An IV tube will be inserted into one of your veins.  You will be given one or more of the following: ? A medicine to help you relax (sedative). ? A medicine to numb the area (local anesthetic).  Two small cuts (incisions) will be made to insert the port. ? One incision will be made in your neck to get access to the vein where the catheter will lie. ? The other incision will be made in the upper chest. This is where the port will lie.  The procedure may be done using continuous X-ray  (fluoroscopy) or other imaging tools for guidance.  The port and catheter will be placed. There may be a small, raised area where the port is.  The port will be flushed with a salt solution (saline), and blood will be drawn to make sure that it is working correctly.  The incisions will be closed.  Bandages (dressings) may be placed over the incisions. The procedure may vary among health care providers and hospitals. What happens after the procedure?  Your blood pressure, heart rate, breathing rate, and blood oxygen level will be monitored until the medicines you were given have worn off.  Do not drive for 24 hours if you were given a sedative.  You will be given a manufacturer's information card for the type of port that you have. Keep this with you.  Your port will need to be flushed and checked as told by your health care provider, usually every few weeks.  A chest X-ray will be done to: ? Check the placement of the port. ? Make sure there is no injury to your lung. Summary  Implanted port insertion is a procedure to put in a port and catheter.  The implanted port is used as a long-term IV access.  The port will need to be flushed and checked as told by your health care provider, usually every few weeks.  Keep your manufacturer's information card with you at all times. This information is not intended to replace advice given to you by your health care provider. Make sure you discuss any questions you have with your health care provider. Document Released: 10/25/2012 Document Revised: 11/26/2015 Document Reviewed: 11/26/2015 Elsevier Interactive Patient Education  2017 [employer] Inc.  

## 2017-06-28 NOTE — Progress Notes (Signed)
Ian Christensen; [id number]; 11-15-1931   HPI Patient is an 84 year old white male who was referred to my care by Dr. Ellin Saba for Port-A-Cath placement.  He has esophageal carcinoma and is about to undergo chemotherapy. Past Medical History:  Diagnosis Date  . Arthritis   . Colostomy in place [facility])   . Esophageal cancer (HCC)   . GERD (gastroesophageal reflux disease)   . History of colon polyps   . History of hiatal hernia   . 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 HTN (hypertension)    followed by pcp  . Hyperlipidemia   . Presence of urostomy (HCC)   . Rectal cancer (HCC) 10/07/2014   no chemo or radiation/ bladder was removed also  . Tinnitus    per pt intermittant   . Wears glasses     Past Surgical History:  Procedure Laterality Date  . APPENDECTOMY  1955  . COLONOSCOPY    . CYSTO N/A 12/11/2014   Procedure:  CYSTO,  PROSTATECTOMY, CYSTECTOMY colon conduit, BILATERAL LYMPH NODE DISSECTION;  Surgeon: Sebastian Ache, MD;  Location: [facility];  Service: Urology;  Laterality: N/A;  . EUS N/A 10/10/2014   Procedure: LOWER ENDOSCOPIC ULTRASOUND (EUS);  Surgeon: Rachael Fee, MD;  Location: [facility];  Service: Endoscopy;  Laterality: N/A;  . HERNIA REPAIR    . INCISIONAL HERNIA REPAIR N/A 03/02/2017   Procedure: INCISIONAL HERNIA REPAIR WITH MESH;  Surgeon: Romie Levee, MD;  Location: [facility];  Service: General;  Laterality: N/A;  . INSERTION OF MESH  03/02/2017   Procedure: INSERTION OF MESH;  Surgeon: Romie Levee, MD;  Location: [facility];  Service: General;;  . LAPAROSCOPIC INTERNAL HERNIA REPAIR N/A 03/06/2017   Procedure: REOPENING OF RECENT LAPAROTOMY WITH CLOSURE OF INCISIONAL HERNIA WITH MESH;  Surgeon: Romie Levee, MD;  Location: [facility];  Service: General;  Laterality: N/A;  . RADIOACTIVE PROSTATE SEED IMPLANTS  02-06-1999  dr Annabell Howells  [facility]  . XI ROBOTIC  ASSISTED LOWER ANTERIOR RESECTION N/A 12/11/2014   Procedure: XI ROBOTIC ASSISTED ABDOMINAL PERINEAL RESECTION OMENTOPLASTY;  Surgeon: Romie Levee, MD;  Location: [facility];  Service: General;  Laterality: N/A;    Family History  Problem Relation Age of Onset  . Diabetes Mother   . Heart disease Mother   . Heart disease Father   . Colon cancer Sister   . Prostate cancer Brother   . Esophageal cancer Neg Hx   . Rectal cancer Neg Hx   . Stomach cancer Neg Hx     Current Outpatient Medications on File Prior to Visit  Medication Sig Dispense Refill  . acetaminophen (TYLENOL) 500 MG tablet Take 1,000 mg by mouth every 8 (eight) hours as needed (for pain).     Marland Kitchen aspirin EC 81 MG tablet Take 162 mg by mouth at bedtime. Takes 30 minutes prior to Niacin    . lidocaine-prilocaine (EMLA) cream Apply a quarter size amount to port site 1 hour prior to chemo. Do not rub in. Cover with plastic wrap. 30 g 3  . losartan (COZAAR) 25 MG tablet Take 25 mg by mouth daily.    . niacin (NIASPAN) 500 MG CR tablet Take 500 mg by mouth every evening.     Marland Kitchen omeprazole (PRILOSEC) 20 MG capsule Take 20 mg by mouth every evening.     . prochlorperazine (COMPAZINE) 10 MG tablet Take 1  tablet (10 mg total) by mouth every 6 (six) hours as needed for nausea or vomiting. 30 tablet 2  . simvastatin (ZOCOR) 20 MG tablet Take 20 mg by mouth every evening.     . cyanocobalamin 500 MCG tablet Take 500 mcg by mouth daily. Vitamin B-12     Current Facility-Administered Medications on File Prior to Visit  Medication Dose Route Frequency Provider Last Rate Last Dose  . 0.9 %  sodium chloride infusion  500 mL Intravenous Once Iva Boop, MD        Allergies  Allergen Reactions  . Morphine And Related     Caused hallucinations.  . Oxycodone     Caused hallucinations    Social History   Substance and Sexual Activity  Alcohol Use Yes  . Alcohol/week: 0.0 oz   Comment: seldom    Social History   Tobacco Use   Smoking Status Never Smoker  Smokeless Tobacco Never Used    Review of Systems  Constitutional: Negative.   HENT: Negative.   Eyes: Negative.   Respiratory: Negative.   Cardiovascular: Negative.   Gastrointestinal: Positive for heartburn.  Genitourinary: Negative.   Musculoskeletal: Positive for back pain.  Skin: Negative.   Neurological: Negative.   Endo/Heme/Allergies: Negative.   Psychiatric/Behavioral: Negative.     Objective   Vitals:   06/28/17 1455  BP: (!) 158/71  Pulse: 87  Resp: 18  Temp: 98.6 F (37 C)    Physical Exam  Constitutional: He is oriented to person, place, and time. He appears well-developed and well-nourished. No distress.  HENT:  Head: Normocephalic and atraumatic.  Cardiovascular: Normal rate, regular rhythm and normal heart sounds. Exam reveals no gallop and no friction rub.  No murmur heard. Pulmonary/Chest: Effort normal and breath sounds normal. No stridor. No respiratory distress. He has no wheezes. He has no rales.  Neurological: He is alert and oriented to person, place, and time.  Skin: Skin is warm and dry.  Vitals reviewed.  Dr. Marice Potter notes reviewed Assessment  Esophageal carcinoma, need for central venous access Plan   Patient is scheduled for Port-A-Cath insertion on 07/05/2017.  The risks and benefits of the procedure including bleeding, infection, and pneumothorax were fully explained to the patient, who gave informed consent.

## 2017-06-29 ENCOUNTER — Ambulatory Visit
Admission: RE | Admit: 2017-06-29 | Discharge: 2017-06-29 | Disposition: A | Discharge status: Home / Self Care | Payer: PPO | Source: Ambulatory Visit | Attending: Radiation Oncology | Admitting: Radiation Oncology

## 2017-06-29 ENCOUNTER — Ambulatory Visit: Payer: PPO

## 2017-06-29 DIAGNOSIS — C155 Malignant neoplasm of lower third of esophagus: Secondary | ICD-10-CM | POA: Diagnosis not present

## 2017-06-29 DIAGNOSIS — Z510 Encounter for antineoplastic radiation therapy: Secondary | ICD-10-CM | POA: Diagnosis not present

## 2017-06-30 ENCOUNTER — Other Ambulatory Visit (HOSPITAL_COMMUNITY): Payer: Self-pay | Admitting: *Deleted

## 2017-06-30 ENCOUNTER — Ambulatory Visit
Admission: RE | Admit: 2017-06-30 | Discharge: 2017-06-30 | Disposition: A | Discharge status: Home / Self Care | Payer: PPO | Source: Ambulatory Visit | Attending: Radiation Oncology | Admitting: Radiation Oncology

## 2017-06-30 ENCOUNTER — Encounter (HOSPITAL_COMMUNITY): Payer: Self-pay

## 2017-06-30 ENCOUNTER — Ambulatory Visit: Payer: PPO

## 2017-06-30 ENCOUNTER — Encounter (HOSPITAL_COMMUNITY)
Admission: RE | Admit: 2017-06-30 | Discharge: 2017-06-30 | Disposition: A | Discharge status: Home / Self Care | Payer: PPO | Source: Ambulatory Visit | Attending: General Surgery | Admitting: General Surgery

## 2017-06-30 DIAGNOSIS — C155 Malignant neoplasm of lower third of esophagus: Secondary | ICD-10-CM

## 2017-06-30 DIAGNOSIS — Z510 Encounter for antineoplastic radiation therapy: Secondary | ICD-10-CM | POA: Diagnosis not present

## 2017-06-30 NOTE — Progress Notes (Signed)
Pt came around after radiation treatment today concerned that his colostomy output had changed. Per pt his stool "looks like hard balls". He says it's changed since he went to a softer diet d/t swallowing difficulty. Updated Alean Rinne of pt's concerns and was instructed to tell pt to increase oral fluid intake with options other than water (popciles, jell-o, broths, etc), and to possibly add Miralax or a stool softener (but be aware that this might result in increased output from the colostomy). Updated pt on instructions and informed him that Dr. Lisbeth Renshaw would see him tomorrow as an under-treat to check and see if his concerns have improved/changed. Pt verbalized understanding and agreement.

## 2017-07-01 ENCOUNTER — Ambulatory Visit: Payer: PPO

## 2017-07-01 ENCOUNTER — Ambulatory Visit
Admission: RE | Admit: 2017-07-01 | Discharge: 2017-07-01 | Disposition: A | Discharge status: Home / Self Care | Payer: PPO | Source: Ambulatory Visit | Attending: Radiation Oncology | Admitting: Radiation Oncology

## 2017-07-01 DIAGNOSIS — Z510 Encounter for antineoplastic radiation therapy: Secondary | ICD-10-CM | POA: Diagnosis not present

## 2017-07-01 DIAGNOSIS — C155 Malignant neoplasm of lower third of esophagus: Secondary | ICD-10-CM | POA: Diagnosis not present

## 2017-07-04 ENCOUNTER — Ambulatory Visit: Payer: PPO

## 2017-07-04 ENCOUNTER — Telehealth: Payer: Self-pay | Admitting: *Deleted

## 2017-07-04 ENCOUNTER — Other Ambulatory Visit: Payer: Self-pay | Admitting: Radiation Oncology

## 2017-07-04 ENCOUNTER — Encounter (HOSPITAL_COMMUNITY): Payer: Self-pay | Admitting: Hematology

## 2017-07-04 ENCOUNTER — Inpatient Hospital Stay (HOSPITAL_BASED_OUTPATIENT_CLINIC_OR_DEPARTMENT_OTHER): Payer: PPO | Admitting: Hematology

## 2017-07-04 ENCOUNTER — Ambulatory Visit (HOSPITAL_COMMUNITY): Payer: PPO

## 2017-07-04 ENCOUNTER — Ambulatory Visit
Admission: RE | Admit: 2017-07-04 | Discharge: 2017-07-04 | Disposition: A | Discharge status: Home / Self Care | Payer: PPO | Source: Ambulatory Visit | Attending: Radiation Oncology | Admitting: Radiation Oncology

## 2017-07-04 ENCOUNTER — Inpatient Hospital Stay (HOSPITAL_COMMUNITY): Payer: PPO

## 2017-07-04 DIAGNOSIS — M545 Low back pain: Secondary | ICD-10-CM | POA: Diagnosis not present

## 2017-07-04 DIAGNOSIS — Z510 Encounter for antineoplastic radiation therapy: Secondary | ICD-10-CM | POA: Diagnosis not present

## 2017-07-04 DIAGNOSIS — Z8546 Personal history of malignant neoplasm of prostate: Secondary | ICD-10-CM

## 2017-07-04 DIAGNOSIS — Z5111 Encounter for antineoplastic chemotherapy: Secondary | ICD-10-CM | POA: Diagnosis not present

## 2017-07-04 DIAGNOSIS — C155 Malignant neoplasm of lower third of esophagus: Secondary | ICD-10-CM

## 2017-07-04 DIAGNOSIS — Z85048 Personal history of other malignant neoplasm of rectum, rectosigmoid junction, and anus: Secondary | ICD-10-CM | POA: Diagnosis not present

## 2017-07-04 LAB — COMPREHENSIVE METABOLIC PANEL
ALBUMIN: 3.5 g/dL (ref 3.5–5.0)
ALT: 25 U/L (ref 17–63)
AST: 18 U/L (ref 15–41)
Alkaline Phosphatase: 74 U/L (ref 38–126)
Anion gap: 7 (ref 5–15)
BILIRUBIN TOTAL: 0.6 mg/dL (ref 0.3–1.2)
BUN: 23 mg/dL — AB (ref 6–20)
CHLORIDE: 104 mmol/L (ref 101–111)
CO2: 27 mmol/L (ref 22–32)
Calcium: 9.2 mg/dL (ref 8.9–10.3)
Creatinine, Ser: 0.85 mg/dL (ref 0.61–1.24)
GFR calc Af Amer: >60 mL/min (ref >60)
GFR calc non Af Amer: >60 mL/min (ref >60)
GLUCOSE: 161 mg/dL — AB (ref 65–99)
Potassium: 4.1 mmol/L (ref 3.5–5.1)
Sodium: 138 mmol/L (ref 135–145)
TOTAL PROTEIN: 6.6 g/dL (ref 6.5–8.1)

## 2017-07-04 LAB — CBC WITH DIFFERENTIAL/PLATELET
BASOS ABS: 0 10*3/uL (ref 0.0–0.1)
BASOS PCT: 0 %
Eosinophils Absolute: 0.2 10*3/uL (ref 0.0–0.7)
Eosinophils Relative: 2 %
HEMATOCRIT: 39.1 % (ref 39.0–52.0)
Hemoglobin: 12.6 g/dL — ABNORMAL LOW (ref 13.0–17.0)
Lymphocytes Relative: 13 %
Lymphs Abs: 1.2 10*3/uL (ref 0.7–4.0)
MCH: 30.7 pg (ref 26.0–34.0)
MCHC: 32.2 g/dL (ref 30.0–36.0)
MCV: 95.4 fL (ref 78.0–100.0)
Monocytes Absolute: 0.4 10*3/uL (ref 0.1–1.0)
Monocytes Relative: 5 %
NEUTROS ABS: 7.1 10*3/uL (ref 1.7–7.7)
NEUTROS PCT: 80 %
Platelets: 250 10*3/uL (ref 150–400)
RBC: 4.1 MIL/uL — AB (ref 4.22–5.81)
RDW: 14.1 % (ref 11.5–15.5)
WBC: 8.9 10*3/uL (ref 4.0–10.5)

## 2017-07-04 MED ORDER — HYDROCODONE-ACETAMINOPHEN 7.5-325 MG/15ML PO SOLN: 10.0000 mL | Freq: Four times a day (QID) | ORAL | 0 refills | Status: DC | PRN

## 2017-07-04 NOTE — Progress Notes (Signed)
Nutrition  Daughter, Ian Christensen notified RD that patient will be getting feeding tube placed tomorrow by Dr. Arnoldo Morale along with portacath placement.    RD has communicated with RD at [facility] and will see patient for tube feeding assessment on Wednesday, June 19 following patients scheduled radiation therapy appointment.  RD has communicated with daughter Ian Christensen regarding nutrition appointment on Wednesday and she is agreeable.  Ian Christensen will notify patient about appointment and Ian Christensen and Ian Christensen's sister Ian Christensen will be in attendance at nutrition appointment.    Communication sent to Dr. Raliegh Ip at [facility] and [place] regarding upcoming nutrition appointment at [place].  Gladis Soley B. Zenia Resides, [place], [place] Registered Dietitian [phone] (pager)

## 2017-07-04 NOTE — Progress Notes (Signed)
Patient came around after his radiation treatment with complaints of painful swallowing and nausea/vomiting.  He has compazine ordered which he had taken prior to his treatment.  Spoke with PA Dara Lords regarding his pain with swallowing and she will send a prescription for hycet to his pharmacy.  Patient and his daughter informed that a prescription was called in.  Patient notified us that he will be going for port and peg placement tomorrow.  Will continue to follow as necessary.  Gloriajean Dell. Leonie Green, BSN

## 2017-07-04 NOTE — Assessment & Plan Note (Signed)
1.  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 - Barium swallow done at [facility] showed a large mass in the distal esophagus. - PET/CT scan on 7/62/8315 hypermetabolic solid 3.5 x 2.3 cm esophageal mass in the mid to lower third of the thoracic esophagus with max SUV 7.3 and nonspecific mildly hypermetabolic nonenlarged bilateral hilar lymph nodes, indeterminate.  We discussed this with the patient in detail. - EGD on 05/26/2017 shows large fungating mass with no bleeding, 30 cm from incisors, extends to 35 cm, partially obstructing, biopsy consistent with squamous cell carcinoma. -Patient currently can eat only soft foods and liquids.  Has lost about 8 pounds in the last 6 months.  Drinks about 3 ensures per day.  We have encouraged him to change Ensure to Ensure Plus, so that he can get more calories. - Started on combination chemoradiation therapy with weekly carboplatin and paclitaxel on 06/27/2017.  Has developed some retrosternal pain while swallowing.  Also feels like he is not emptying completely.  No regurgitation yet.  Had one episode of vomiting this morning.  Had an extensive discussion with the patient and her daughter about the need for a PEG tube as these symptoms will likely get worse.  He is agreeable at this time.  I have talked to Dr. Arnoldo Morale who kindly agreed to place a PEG tube tomorrow when he is scheduled to get a Port-A-Cath placed.  He is complaining of ringing in the ears which has been constant.  Previously he used to have ringing only at nighttime.  Hence I will cut back on the carboplatin dose to 125 mg flat.  We will continue the same dose reduced paclitaxel at 40 mg/m.  He will come back in 1 week for follow-up.  2.  Colorectal cancer: - Underwent segmental colon resection in November 2016, found to have a T3N0 adenocarcinoma, with circumferential margins positive.  Patient did not receive any neoadjuvant  or adjuvant chemotherapy/radiation therapy.  3.  Prostate cancer: -Originally diagnosed in early 2000s, status post seed implants.  Underwent cystoprostatectomy in November 2016, pathology showed prostatic adenocarcinoma Gleason 8 (4+4).

## 2017-07-04 NOTE — Progress Notes (Signed)
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  CURRENT THERAPY: Combination chemoradiation therapy with carboplatin and paclitaxel.  BRIEF ONCOLOGIC HISTORY:    Prostate cancer ([place])   12/11/2014 Procedure    Prostatectomy at time of rectal ca resection      12/17/2014 Pathology Results    PROSTATE: PROSTATIC ADENOCARCINOMA WITHOUT TREATMENT EFFECT, GLEASON SCORE 4+4=8 THE TUMOR LOCATED AT THE LEFT ANTERIOR APEX AND THE MID MARGIN OF RESECTION ARE POSITIVE AT THE LEFT ANTERIOR APEX (0.4 CM, GLEASON SCORE 4+4=8) OTHER MARGINS ARE NEGATIVE FOR TUMOR       Rectal cancer ([place])   10/02/2014 Procedure    Colonoscopy by Dr. Carlean Purl- One-third circumferential medium mass was found in the distal rectum; ulcerated, firm anterior over prostate bed, multiple biopsies were performed using cold forceps- suspect carcinoma ? rectal vs recurrence of prostate      10/04/2014 Pathology Results    Rectum, biopsy ulcerated mass-invasive adenocarcinoma consistent with colorectal origin.  CDX 2 positive.  PSA negative.      10/07/2014 Initial Diagnosis    Rectal cancer ([place])      10/08/2014 Imaging    CT CAP- 1. Wall thickening of the rectum compatible with recently diagnosed rectal carcinoma. Tiny subcentimeter perirectal lymph nodes. 2. Multiple bilateral pulmonary nodules as above. While these may be infectious or inflammatory in etiology, metastatic disease in the setting of known rectal carcinoma is not excluded. 3. Nodular soft tissue within the left mainstem bronchus which is nonspecific and may represent mucus however endobronchial nodule is not excluded. Recommend either attention on followup or correlation with bronchoscopy if the patient is having respiratory  symptoms. 4. No evidence for focal hepatic lesion. 5. Bilateral low-attenuation renal lesions with internal densities greater than that of fluid, indeterminate. Recommend attention on followup or definitive characterization with pre and post contrast-enhanced MRI.      10/10/2014 Procedure    EUS by Dr. Ardis Hughs- Non-circumferential, 3.4cm [phone] (Stage IIIB) rectal adenocarcinoma along the anterior wall of the distal rectum with distal edge 1-2cm from the anal verge.      12/11/2014 Procedure    Segmental resection for tumor by Dr. Leighton Ruff      44/31/5400 Pathology Results    Colon, segmental resection for tumor, Bladder and Prostate COLON: INVASIVE COLONIC ADENOCARCINOMA (3.5 CM IN GREATEST DIMENSION) THE CARCINOMA INVASIVE THROUGH THE MUSCULARIS PROPRIA AND PRESENTS AT THE ANTERIOR MESORECTUM RESECTION MARGIN (1.5 CM, R2) THE PROXIMAL AND DISTAL RESECTION MARGINS ARE NEGATIVE FOR TUMOR LYMPHOVASCULAR INVASION IS IDENTIFED ELEVEN BENIGN LYMPH NODES (0/11) PROSTATE: PROSTATIC ADENOCARCINOMA WITHOUT TREATMENT EFFECT, GLEASON SCORE 4+4=8 THE TUMOR LOCATED AT THE LEFT ANTERIOR APEX AND THE MID MARGIN OF RESECTION ARE POSITIVE AT THE LEFT ANTERIOR APEX (0.4 CM, GLEASON SCORE 4+4=8) OTHER MARGINS ARE NEGATIVE FOR TUMOR BLADDER: BENIGN UROTHELIUM AND SUBCUTANOUS TISSUE NEGATIVE FOR MALIGNANCY      06/20/2015 Imaging    CT CAP- 1. Interval cystoprostatectomy, urinary diversion, abdominal perineal resection and sigmoid colostomy. 2. No evidence of abdominal pelvic metastatic disease. 3. Stable small pulmonary nodules bilaterally. Based on stability over nearly 9 months, these are likely benign. Attention on continued follow-up recommended. 4. Stable low-density renal lesions bilaterally. 5. Moderate atherosclerosis.      12/31/2015 Procedure    Colonoscopy by Dr. Heron Sabins and  colostomy in the sigmoid colon.  Diverticulosis in the left colon, examination otherwise  normal.      06/21/2016 Imaging    CT CAP- Stable exam. No definite evidence of recurrent or metastatic carcinoma.  Stable sub-cm noncalcified bilateral pulmonary nodules, most consistent with benign postinflammatory etiology. Recommend continued attention on follow-up imaging.  Cholelithiasis. No radiographic evidence of cholecystitis.  Aortic and coronary artery atherosclerosis.       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 ([place])      06/26/2017 -  Chemotherapy    The patient had palonosetron (ALOXI) injection 0.25 mg, 0.25 mg, Intravenous,  Once, 1 of 1 cycle Administration: 0.25 mg (06/27/2017) CARBOplatin (PARAPLATIN) 150 mg in sodium chloride 0.9 % 100 mL chemo infusion, 150 mg (100 % of original dose 150 mg), Intravenous,  Once, 1 of 1 cycle Dose modification: 150 mg (original dose 150 mg, Cycle 1, Reason: Provider Judgment), 125 mg (original dose 125 mg, Cycle 1, Reason: Provider Judgment) Administration: 150 mg (06/27/2017) PACLitaxel (TAXOL) 78 mg in sodium chloride 0.9 % 250 mL chemo infusion (</= 80mg /m2), 40 mg/m2 = 78 mg (80 % of original dose 50 mg/m2), Intravenous,  Once, 1 of 1 cycle Dose modification: 40 mg/m2 (80 % of original dose 50 mg/m2, Cycle 1, Reason: Provider Judgment), 40 mg/m2 (80 % of original dose 50 mg/m2, Cycle 1, Reason: Provider Judgment) Administration: 78 mg (06/27/2017)  for chemotherapy treatment.         CANCER STAGING: Cancer Staging Rectal cancer [facility]) Staging form: Colon and Rectum, AJCC 8th Edition - Pathologic stage from 05/31/2016: Stage IIA (pT3, pN0, cM0) - Signed by Baird Cancer, PA-C on 05/31/2016    INTERVAL HISTORY:  Ian Christensen 84 y.o. male returns for follow-up and second cycle of chemotherapy.  He complained of some fatigue after last week.  He also had some pain while swallowing.  He felt like his use  of fragments is not evacuating into the stomach when he drinks Ensure.  It takes a little while for it to go down.  Had one episode of vomiting this morning.  Taking Compazine for it.  He also complains of worsening of his back pain since he started radiation and chemo.  He had this back pain for years.  However this has gotten slightly worse.  He had to take Tylenol and also took 2 tablets of tramadol yesterday.  Complains of legs being shaky in the morning.  No fevers or infections.    REVIEW OF SYSTEMS:  Review of Systems  Constitutional: Positive for fatigue.  HENT:   Positive for trouble swallowing.   Gastrointestinal: Positive for constipation, nausea and vomiting.  Musculoskeletal: Positive for back pain.  All other systems reviewed and are negative.    PAST MEDICAL/SURGICAL HISTORY:  Past Medical History:  Diagnosis Date  . Arthritis   . Colostomy in place [facility])   . Esophageal cancer ([place])   . GERD (gastroesophageal reflux disease)   . History of colon polyps   . History of hiatal hernia   . 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 HTN (hypertension)    followed by pcp  . Hyperlipidemia   . Presence of urostomy ([place])   . Rectal cancer ([place]) 10/07/2014   no  chemo or radiation/ bladder was removed also  . Tinnitus    per pt intermittant   . Wears glasses    Past Surgical History:  Procedure Laterality Date  . APPENDECTOMY  1955  . COLONOSCOPY    . CYSTO N/A 12/11/2014   Procedure:  CYSTO,  PROSTATECTOMY, CYSTECTOMY colon conduit, BILATERAL LYMPH NODE DISSECTION;  Surgeon: Alexis Frock, MD;  Location: [facility];  Service: Urology;  Laterality: N/A;  . EUS N/A 10/10/2014   Procedure: LOWER ENDOSCOPIC ULTRASOUND (EUS);  Surgeon: Milus Banister, MD;  Location: [facility];  Service: Endoscopy;  Laterality: N/A;  . HERNIA REPAIR    . INCISIONAL HERNIA REPAIR N/A  03/02/2017   Procedure: INCISIONAL HERNIA REPAIR WITH MESH;  Surgeon: Leighton Ruff, MD;  Location: [facility];  Service: General;  Laterality: N/A;  . INSERTION OF MESH  03/02/2017   Procedure: INSERTION OF MESH;  Surgeon: Leighton Ruff, MD;  Location: [facility];  Service: General;;  . LAPAROSCOPIC INTERNAL HERNIA REPAIR N/A 03/06/2017   Procedure: REOPENING OF RECENT LAPAROTOMY WITH CLOSURE OF INCISIONAL HERNIA WITH MESH;  Surgeon: Leighton Ruff, MD;  Location: [facility];  Service: General;  Laterality: N/A;  . RADIOACTIVE PROSTATE SEED IMPLANTS  02-06-1999  dr Jeffie Pollock  [facility]  . XI ROBOTIC ASSISTED LOWER ANTERIOR RESECTION N/A 12/11/2014   Procedure: XI ROBOTIC ASSISTED ABDOMINAL PERINEAL RESECTION OMENTOPLASTY;  Surgeon: Leighton Ruff, MD;  Location: [facility];  Service: General;  Laterality: N/A;     SOCIAL HISTORY:  Social History   Socioeconomic History  . Marital status: Widowed    Spouse name: Not on file  . Number of children: 2  . Years of education: Not on file  . Highest education level: Not on file  Occupational History  . Occupation: retired  Scientific laboratory technician  . Financial resource strain: Not very hard  . Food insecurity:    Worry: Never true    Inability: Never true  . Transportation needs:    Medical: No    Non-medical: No  Tobacco Use  . Smoking status: Never Smoker  . Smokeless tobacco: Never Used  Substance and Sexual Activity  . Alcohol use: Yes    Alcohol/week: 0.0 oz    Comment: seldom  . Drug use: No  . Sexual activity: Not Currently    Comment: Widowed  Lifestyle  . Physical activity:    Days per week: 1 day    Minutes per session: 30 min  . Stress: Not at all  Relationships  . Social connections:    Talks on phone: Three times a week    Gets together: Three times a week    Attends religious service: More than 4 times per year    Active member of club or organization: Yes    Attends meetings of clubs or organizations: More than 4 times per year    Relationship  status: Widowed  . Intimate partner violence:    Fear of current or ex partner: No    Emotionally abused: No    Physically abused: No    Forced sexual activity: No  Other Topics Concern  . Not on file  Social History Narrative  . Not on file    FAMILY HISTORY:  Family History  Problem Relation Age of Onset  . Diabetes Mother   . Heart disease Mother   . Heart disease Father   . Colon cancer Sister   . Prostate cancer Brother   . Esophageal cancer Neg Hx   . Rectal  cancer Neg Hx   . Stomach cancer Neg Hx     CURRENT MEDICATIONS:  Outpatient Encounter Medications as of 07/04/2017  Medication Sig  . ACETAMINOPHEN PO Take 650 mg by mouth every 8 (eight) hours as needed (for pain).   Marland Kitchen aspirin EC 81 MG tablet Take 162 mg by mouth at bedtime. Takes 30 minutes prior to Niacin  . cyanocobalamin 500 MCG tablet Take 500 mcg by mouth daily. Vitamin B-12  . lidocaine-prilocaine (EMLA) cream Apply a quarter size amount to port site 1 hour prior to chemo. Do not rub in. Cover with plastic wrap.  . losartan (COZAAR) 25 MG tablet Take 25 mg by mouth daily.  . metoprolol succinate (TOPROL-XL) 25 MG 24 hr tablet Take 25 mg by mouth 2 (two) times daily.  . niacin (NIASPAN) 500 MG CR tablet Take 500 mg by mouth every evening.   Marland Kitchen omeprazole (PRILOSEC) 20 MG capsule Take 20 mg by mouth every evening.   . prochlorperazine (COMPAZINE) 10 MG tablet Take 1 tablet (10 mg total) by mouth every 6 (six) hours as needed for nausea or vomiting.  . simvastatin (ZOCOR) 20 MG tablet Take 20 mg by mouth every evening.   . traMADol (ULTRAM) 50 MG tablet Take 50 mg by mouth every 6 (six) hours as needed for moderate pain.   Facility-Administered Encounter Medications as of 07/04/2017  Medication  . 0.9 %  sodium chloride infusion    ALLERGIES:  Allergies  Allergen Reactions  . Morphine And Related     Caused hallucinations.  . Oxycodone     Caused hallucinations     PHYSICAL EXAM:  ECOG Performance  status: 1  Vitals:   07/04/17 0844  BP: 136/60  Pulse: 84  Resp: 16  Temp: 98 F (36.7 C)  SpO2: 96%   Filed Weights   07/04/17 0844  Weight: 180 lb 6.4 oz (81.8 kg)    Physical Exam   LABORATORY DATA:  I have reviewed the labs as listed.  CBC    Component Value Date/Time   WBC 8.9 07/04/2017 0807   RBC 4.10 (L) 07/04/2017 0807   HGB 12.6 (L) 07/04/2017 0807   HCT 39.1 07/04/2017 0807   PLT 250 07/04/2017 0807   MCV 95.4 07/04/2017 0807   MCH 30.7 07/04/2017 0807   MCHC 32.2 07/04/2017 0807   RDW 14.1 07/04/2017 0807   LYMPHSABS 1.2 07/04/2017 0807   MONOABS 0.4 07/04/2017 0807   EOSABS 0.2 07/04/2017 0807   BASOSABS 0.0 07/04/2017 0807   CMP Latest Ref Rng & Units 07/04/2017 06/27/2017 03/06/2017  Glucose 65 - 99 mg/dL 161(H) 144(H) 88  BUN 6 - 20 mg/dL 23(H) 16 25(H)  Creatinine 0.61 - 1.24 mg/dL 0.85 1.01 1.04  Sodium 135 - 145 mmol/L 138 140 138  Potassium 3.5 - 5.1 mmol/L 4.1 3.8 3.7  Chloride 101 - 111 mmol/L 104 106 107  CO2 22 - 32 mmol/L 27 26 24   Calcium 8.9 - 10.3 mg/dL 9.2 9.7 8.1(L)  Total Protein 6.5 - 8.1 g/dL 6.6 7.2 -  Total Bilirubin 0.3 - 1.2 mg/dL 0.6 0.7 -  Alkaline Phos 38 - 126 U/L 74 66 -  AST 15 - 41 U/L 18 17 -  ALT 17 - 63 U/L 25 14(L) -          ASSESSMENT & PLAN:   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  Squamous cell carcinoma of the distal esophagus: - Patient presented with  dysphagia to solid foods since February 2019. - Barium swallow done at [facility] showed a large mass in the distal esophagus. - PET/CT scan on 05/04/3843 hypermetabolic solid 3.5 x 2.3 cm esophageal mass in the mid to lower third of the thoracic esophagus with max SUV 7.3 and nonspecific mildly hypermetabolic nonenlarged bilateral hilar lymph nodes, indeterminate.  We discussed this with the patient in detail. - EGD on 05/26/2017 shows large fungating mass with no bleeding, 30 cm from incisors, extends to 35 cm, partially  obstructing, biopsy consistent with squamous cell carcinoma. -Patient currently can eat only soft foods and liquids.  Has lost about 8 pounds in the last 6 months.  Drinks about 3 ensures per day.  We have encouraged him to change Ensure to Ensure Plus, so that he can get more calories. - Started on combination chemoradiation therapy with weekly carboplatin and paclitaxel on 06/27/2017.  Has developed some retrosternal pain while swallowing.  Also feels like he is not emptying completely.  No regurgitation yet.  Had one episode of vomiting this morning.  Had an extensive discussion with the patient and her daughter about the need for a PEG tube as these symptoms will likely get worse.  He is agreeable at this time.  I have talked to Dr. Arnoldo Morale who kindly agreed to place a PEG tube tomorrow when he is scheduled to get a Port-A-Cath placed.  He is complaining of ringing in the ears which has been constant.  Previously he used to have ringing only at nighttime.  Hence I will cut back on the carboplatin dose to 125 mg flat.  We will continue the same dose reduced paclitaxel at 40 mg/m.  He will come back in 1 week for follow-up.  2.  Colorectal cancer: - Underwent segmental colon resection in November 2016, found to have a T3N0 adenocarcinoma, with circumferential margins positive.  Patient did not receive any neoadjuvant or adjuvant chemotherapy/radiation therapy.  3.  Prostate cancer: -Originally diagnosed in early 2000s, status post seed implants.  Underwent cystoprostatectomy in November 2016, pathology showed prostatic adenocarcinoma Gleason 8 (4+4).      Orders placed this encounter:  No orders of the defined types were placed in this encounter.     Ian Jack, MD [place] [phone]

## 2017-07-04 NOTE — Telephone Encounter (Signed)
Oncology Nurse Navigator Documentation  Spoke with Ian Christensen, [facility], requested delivery of Start of Care [place] Feeding Kit to [facility] front desk by noon this Wed in support of patient's PEG placement tomorrow.  She voiced understanding.  Gayleen [place], RN, BSN Head & Neck Oncology Nurse [place] at [place] [phone]

## 2017-07-04 NOTE — Patient Instructions (Signed)
[facility] at [facility] Discharge Instructions  You saw Dr. Katragadda today.   Thank you for choosing [facility] at [facility] to provide your oncology and hematology care.  To afford each patient quality time with our provider, please arrive at least 15 minutes before your scheduled appointment time.   If you have a lab appointment with the Cancer Center please come in thru the  Main Entrance and check in at the main information desk  You need to re-schedule your appointment should you arrive 10 or more minutes late.  We strive to give you quality time with our providers, and arriving late affects you and other patients whose appointments are after yours.  Also, if you no show three or more times for appointments you may be dismissed from the clinic at the providers discretion.     Again, thank you for choosing [facility].  Our hope is that these requests will decrease the amount of time that you wait before being seen by our physicians.       _____________________________________________________________  Should you have questions after your visit to [facility], please contact our office at [phone] between the hours of 8:30 a.m. and 4:30 p.m.  Voicemails left after 4:30 p.m. will not be returned until the following business day.  For prescription refill requests, have your pharmacy contact our office.       Resources For Cancer Patients and their Caregivers ? American Cancer Society: Can assist with transportation, wigs, general needs, runs Look Good Feel Better.        1-888-227-6333 ? Cancer Care: Provides financial assistance, online support groups, medication/co-pay assistance.  1-800-813-HOPE (4673) ? [facility] Assists [place] cancer patients and their families through emotional , educational and financial support.  336-427-4357 ? [place] DSS Where to apply for  food stamps, Medicaid and utility assistance. 336-342-1394 ? [facility]: Transportation to medical appointments. 336-347-2287 ? Social Security Administration: May apply for disability if have a Stage IV cancer. 336-342-7796 1-800-772-1213 ? [place] Aging, Disability and Transit Services: Assists with nutrition, care and transit needs. 336-349-2343  Cancer Center Support Programs:   > Cancer Support Group  2nd Tuesday of the month 1pm-2pm, Journey Room   > Creative Journey  3rd Tuesday of the month 1130am-1pm, Journey Room     

## 2017-07-05 ENCOUNTER — Ambulatory Visit (HOSPITAL_COMMUNITY): Payer: PPO

## 2017-07-05 ENCOUNTER — Ambulatory Visit (HOSPITAL_COMMUNITY): Payer: PPO | Admitting: Anesthesiology

## 2017-07-05 ENCOUNTER — Encounter (HOSPITAL_COMMUNITY)
Admission: RE | Disposition: A | Discharge status: Home / Self Care | Payer: Self-pay | Source: Ambulatory Visit | Attending: General Surgery

## 2017-07-05 ENCOUNTER — Encounter (HOSPITAL_COMMUNITY): Payer: Self-pay

## 2017-07-05 ENCOUNTER — Other Ambulatory Visit: Payer: Self-pay | Admitting: Radiation Oncology

## 2017-07-05 ENCOUNTER — Inpatient Hospital Stay (HOSPITAL_COMMUNITY): Payer: PPO | Attending: Hematology

## 2017-07-05 ENCOUNTER — Ambulatory Visit
Admission: RE | Admit: 2017-07-05 | Discharge: 2017-07-05 | Disposition: A | Discharge status: Home / Self Care | Payer: PPO | Source: Ambulatory Visit | Attending: Radiation Oncology | Admitting: Radiation Oncology

## 2017-07-05 ENCOUNTER — Encounter (HOSPITAL_COMMUNITY): Payer: Self-pay | Admitting: *Deleted

## 2017-07-05 ENCOUNTER — Ambulatory Visit (HOSPITAL_COMMUNITY)
Admission: RE | Admit: 2017-07-05 | Discharge: 2017-07-05 | Disposition: A | Discharge status: Home / Self Care | Payer: PPO | Source: Ambulatory Visit | Attending: General Surgery | Admitting: General Surgery

## 2017-07-05 ENCOUNTER — Ambulatory Visit: Payer: PPO

## 2017-07-05 VITALS — BP 134/61 | HR 86 | Temp 97.6°F | Resp 16 | Wt 177.4 lb

## 2017-07-05 DIAGNOSIS — Z800 Family history of malignant neoplasm of digestive organs: Secondary | ICD-10-CM | POA: Insufficient documentation

## 2017-07-05 DIAGNOSIS — Z5111 Encounter for antineoplastic chemotherapy: Secondary | ICD-10-CM | POA: Insufficient documentation

## 2017-07-05 DIAGNOSIS — Z85048 Personal history of other malignant neoplasm of rectum, rectosigmoid junction, and anus: Secondary | ICD-10-CM | POA: Insufficient documentation

## 2017-07-05 DIAGNOSIS — M1990 Unspecified osteoarthritis, unspecified site: Secondary | ICD-10-CM | POA: Diagnosis not present

## 2017-07-05 DIAGNOSIS — K219 Gastro-esophageal reflux disease without esophagitis: Secondary | ICD-10-CM | POA: Diagnosis not present

## 2017-07-05 DIAGNOSIS — Z452 Encounter for adjustment and management of vascular access device: Secondary | ICD-10-CM | POA: Diagnosis not present

## 2017-07-05 DIAGNOSIS — H9319 Tinnitus, unspecified ear: Secondary | ICD-10-CM | POA: Insufficient documentation

## 2017-07-05 DIAGNOSIS — Z936 Other artificial openings of urinary tract status: Secondary | ICD-10-CM | POA: Insufficient documentation

## 2017-07-05 DIAGNOSIS — E785 Hyperlipidemia, unspecified: Secondary | ICD-10-CM | POA: Insufficient documentation

## 2017-07-05 DIAGNOSIS — Z885 Allergy status to narcotic agent status: Secondary | ICD-10-CM | POA: Insufficient documentation

## 2017-07-05 DIAGNOSIS — K449 Diaphragmatic hernia without obstruction or gangrene: Secondary | ICD-10-CM | POA: Diagnosis not present

## 2017-07-05 DIAGNOSIS — Z933 Colostomy status: Secondary | ICD-10-CM | POA: Insufficient documentation

## 2017-07-05 DIAGNOSIS — I10 Essential (primary) hypertension: Secondary | ICD-10-CM | POA: Insufficient documentation

## 2017-07-05 DIAGNOSIS — Z8546 Personal history of malignant neoplasm of prostate: Secondary | ICD-10-CM | POA: Insufficient documentation

## 2017-07-05 DIAGNOSIS — Z7982 Long term (current) use of aspirin: Secondary | ICD-10-CM | POA: Insufficient documentation

## 2017-07-05 DIAGNOSIS — Z510 Encounter for antineoplastic radiation therapy: Secondary | ICD-10-CM | POA: Diagnosis not present

## 2017-07-05 DIAGNOSIS — Z79899 Other long term (current) drug therapy: Secondary | ICD-10-CM | POA: Diagnosis not present

## 2017-07-05 DIAGNOSIS — Z86010 Personal history of colonic polyps: Secondary | ICD-10-CM | POA: Insufficient documentation

## 2017-07-05 DIAGNOSIS — C155 Malignant neoplasm of lower third of esophagus: Secondary | ICD-10-CM

## 2017-07-05 DIAGNOSIS — Z8042 Family history of malignant neoplasm of prostate: Secondary | ICD-10-CM | POA: Insufficient documentation

## 2017-07-05 DIAGNOSIS — Z8249 Family history of ischemic heart disease and other diseases of the circulatory system: Secondary | ICD-10-CM | POA: Insufficient documentation

## 2017-07-05 DIAGNOSIS — R1310 Dysphagia, unspecified: Secondary | ICD-10-CM

## 2017-07-05 DIAGNOSIS — R1319 Other dysphagia: Secondary | ICD-10-CM

## 2017-07-05 DIAGNOSIS — C159 Malignant neoplasm of esophagus, unspecified: Secondary | ICD-10-CM | POA: Diagnosis not present

## 2017-07-05 DIAGNOSIS — Z833 Family history of diabetes mellitus: Secondary | ICD-10-CM | POA: Insufficient documentation

## 2017-07-05 DIAGNOSIS — Z95828 Presence of other vascular implants and grafts: Secondary | ICD-10-CM

## 2017-07-05 HISTORY — PX: PEG PLACEMENT: SHX5437

## 2017-07-05 HISTORY — PX: ESOPHAGOGASTRODUODENOSCOPY (EGD) WITH PROPOFOL: SHX5813

## 2017-07-05 HISTORY — PX: PORTACATH PLACEMENT: SHX2246

## 2017-07-05 SURGERY — INSERTION, TUNNELED CENTRAL VENOUS DEVICE, WITH PORT
Anesthesia: Monitor Anesthesia Care | Site: Chest

## 2017-07-05 MED ORDER — PROPOFOL 500 MG/50ML IV EMUL
INTRAVENOUS | Status: DC | PRN
  Administered 2017-07-05: 75 ug/kg/min via INTRAVENOUS
  Administered 2017-07-05: 08:00:00 via INTRAVENOUS

## 2017-07-05 MED ORDER — LIDOCAINE HCL (PF) 1 % IJ SOLN
INTRAMUSCULAR | Status: DC | PRN
  Administered 2017-07-05: 4 mL
  Administered 2017-07-05: 7 mL

## 2017-07-05 MED ORDER — HEPARIN SOD (PORK) LOCK FLUSH 100 UNIT/ML IV SOLN
INTRAVENOUS | Status: AC
  Filled 2017-07-05: qty 5

## 2017-07-05 MED ORDER — HEPARIN SOD (PORK) LOCK FLUSH 100 UNIT/ML IV SOLN
INTRAVENOUS | Status: DC | PRN
  Administered 2017-07-05: 500 [IU] via INTRAVENOUS

## 2017-07-05 MED ORDER — PROPOFOL 10 MG/ML IV BOLUS
INTRAVENOUS | Status: AC
  Filled 2017-07-05: qty 20

## 2017-07-05 MED ORDER — EPHEDRINE SULFATE 50 MG/ML IJ SOLN
INTRAMUSCULAR | Status: DC | PRN
  Administered 2017-07-05: 5 mg via INTRAVENOUS
  Administered 2017-07-05: 10 mg via INTRAVENOUS

## 2017-07-05 MED ORDER — FENTANYL CITRATE (PF) 100 MCG/2ML IJ SOLN
INTRAMUSCULAR | Status: AC
  Filled 2017-07-05: qty 2

## 2017-07-05 MED ORDER — MIDAZOLAM HCL 2 MG/2ML IJ SOLN
INTRAMUSCULAR | Status: AC
  Filled 2017-07-05: qty 2

## 2017-07-05 MED ORDER — BACITRACIN-NEOMYCIN-POLYMYXIN OINTMENT TUBE
TOPICAL_OINTMENT | CUTANEOUS | Status: DC | PRN
  Administered 2017-07-05: 1 via TOPICAL

## 2017-07-05 MED ORDER — PALONOSETRON HCL INJECTION 0.25 MG/5ML
0.2500 mg | Freq: Once | INTRAVENOUS | Status: AC
  Administered 2017-07-05: 0.25 mg via INTRAVENOUS
  Filled 2017-07-05: qty 5

## 2017-07-05 MED ORDER — CHLORHEXIDINE GLUCONATE CLOTH 2 % EX PADS: 6.0000 | MEDICATED_PAD | Freq: Once | CUTANEOUS | Status: DC

## 2017-07-05 MED ORDER — LACTATED RINGERS IV SOLN
INTRAVENOUS | Status: DC
  Administered 2017-07-05: 1000 mL via INTRAVENOUS

## 2017-07-05 MED ORDER — SODIUM CHLORIDE 0.9 % IV SOLN
40.0000 mg/m2 | Freq: Once | INTRAVENOUS | Status: AC
  Administered 2017-07-05: 78 mg via INTRAVENOUS
  Filled 2017-07-05: qty 13

## 2017-07-05 MED ORDER — SODIUM CHLORIDE 0.9 % IV SOLN
125.0000 mg | Freq: Once | INTRAVENOUS | Status: AC
  Administered 2017-07-05: 130 mg via INTRAVENOUS
  Filled 2017-07-05: qty 13

## 2017-07-05 MED ORDER — HEPARIN SOD (PORK) LOCK FLUSH 100 UNIT/ML IV SOLN
500.0000 [IU] | Freq: Once | INTRAVENOUS | Status: AC | PRN
  Administered 2017-07-05: 500 [IU]
  Filled 2017-07-05: qty 5

## 2017-07-05 MED ORDER — LIDOCAINE HCL (PF) 1 % IJ SOLN
INTRAMUSCULAR | Status: AC
  Filled 2017-07-05: qty 30

## 2017-07-05 MED ORDER — DIPHENHYDRAMINE HCL 50 MG/ML IJ SOLN
50.0000 mg | Freq: Once | INTRAMUSCULAR | Status: AC
  Administered 2017-07-05: 50 mg via INTRAVENOUS
  Filled 2017-07-05: qty 1

## 2017-07-05 MED ORDER — SUCRALFATE 1 G PO TABS: ORAL_TABLET | ORAL | 2 refills | Status: DC

## 2017-07-05 MED ORDER — SODIUM CHLORIDE 0.9% FLUSH
10.0000 mL | INTRAVENOUS | Status: DC | PRN
  Administered 2017-07-05: 10 mL
  Filled 2017-07-05: qty 10

## 2017-07-05 MED ORDER — ACETAMINOPHEN 325 MG PO TABS
ORAL_TABLET | ORAL | Status: AC
  Filled 2017-07-05: qty 2

## 2017-07-05 MED ORDER — FAMOTIDINE IN NACL 20-0.9 MG/50ML-% IV SOLN
20.0000 mg | Freq: Once | INTRAVENOUS | Status: AC
Start: 2017-07-05 — End: 2017-07-05
  Administered 2017-07-05: 20 mg via INTRAVENOUS
  Filled 2017-07-05: qty 50

## 2017-07-05 MED ORDER — SODIUM CHLORIDE 0.9 % IV SOLN
Freq: Once | INTRAVENOUS | Status: AC
  Administered 2017-07-05: 10:00:00 via INTRAVENOUS

## 2017-07-05 MED ORDER — DEXAMETHASONE SODIUM PHOSPHATE 100 MG/10ML IJ SOLN
20.0000 mg | Freq: Once | INTRAMUSCULAR | Status: AC
  Administered 2017-07-05: 20 mg via INTRAVENOUS
  Filled 2017-07-05: qty 2

## 2017-07-05 MED ORDER — FENTANYL CITRATE (PF) 100 MCG/2ML IJ SOLN: 25.0000 ug | INTRAMUSCULAR | Status: DC | PRN

## 2017-07-05 MED ORDER — SIMETHICONE 40 MG/0.6ML PO SUSP
ORAL | Status: DC | PRN
  Administered 2017-07-05: 1000 mL

## 2017-07-05 MED ORDER — KETOROLAC TROMETHAMINE 30 MG/ML IJ SOLN
15.0000 mg | Freq: Once | INTRAMUSCULAR | Status: DC
Start: 2017-07-05 — End: 2017-07-05

## 2017-07-05 MED ORDER — SODIUM CHLORIDE 0.9 % IJ SOLN
INTRAMUSCULAR | Status: DC | PRN
  Administered 2017-07-05: 500 mL

## 2017-07-05 MED ORDER — CEFAZOLIN SODIUM-DEXTROSE 2-4 GM/100ML-% IV SOLN
2.0000 g | INTRAVENOUS | Status: AC
  Administered 2017-07-05: 2 g via INTRAVENOUS
  Filled 2017-07-05: qty 100

## 2017-07-05 MED ORDER — PROPOFOL 10 MG/ML IV BOLUS
INTRAVENOUS | Status: DC | PRN
  Administered 2017-07-05 (×3): 20 mg via INTRAVENOUS

## 2017-07-05 MED ORDER — FENTANYL CITRATE (PF) 100 MCG/2ML IJ SOLN
INTRAMUSCULAR | Status: DC | PRN
  Administered 2017-07-05 (×2): 25 ug via INTRAVENOUS

## 2017-07-05 MED ORDER — ACETAMINOPHEN 325 MG PO TABS
650.0000 mg | ORAL_TABLET | Freq: Once | ORAL | Status: AC
  Administered 2017-07-05: 650 mg via ORAL

## 2017-07-05 SURGICAL SUPPLY — 32 items
BAG DECANTER FOR FLEXI CONT (MISCELLANEOUS) ×4 IMPLANT
CHLORAPREP W/TINT 10.5 ML (MISCELLANEOUS) ×4 IMPLANT
CLOTH BEACON ORANGE TIMEOUT ST (SAFETY) ×4 IMPLANT
COVER LIGHT HANDLE STERIS (MISCELLANEOUS) ×8 IMPLANT
DECANTER SPIKE VIAL GLASS SM (MISCELLANEOUS) ×4 IMPLANT
DERMABOND ADVANCED (GAUZE/BANDAGES/DRESSINGS) ×2
DERMABOND ADVANCED .7 DNX12 (GAUZE/BANDAGES/DRESSINGS) ×2 IMPLANT
DRAPE C-ARM FOLDED MOBILE STRL (DRAPES) ×4 IMPLANT
ELECT REM PT RETURN 9FT ADLT (ELECTROSURGICAL) ×4
ELECTRODE REM PT RTRN 9FT ADLT (ELECTROSURGICAL) ×2 IMPLANT
ENDOVIVE SAFETY PEG KIT IMPLANT
GLOVE BIOGEL PI IND STRL 7.0 (GLOVE) ×4 IMPLANT
GLOVE BIOGEL PI INDICATOR 7.0 (GLOVE) ×4
GLOVE SURG SS PI 6.5 STRL IVOR (GLOVE) ×4 IMPLANT
GLOVE SURG SS PI 7.5 STRL IVOR (GLOVE) IMPLANT
GOWN STRL REUS W/TWL LRG LVL3 (GOWN DISPOSABLE) ×8 IMPLANT
IV NS 500ML (IV SOLUTION) ×2
IV NS 500ML BAXH (IV SOLUTION) ×2 IMPLANT
KIT PORT POWER 8FR ISP MRI (Port) ×4 IMPLANT
KIT TURNOVER KIT A (KITS) ×4 IMPLANT
NEEDLE HYPO 25X1 1.5 SAFETY (NEEDLE) ×4 IMPLANT
PACK MINOR (CUSTOM PROCEDURE TRAY) ×4 IMPLANT
PAD ARMBOARD 7.5X6 YLW CONV (MISCELLANEOUS) ×4 IMPLANT
SET BASIN LINEN APH (SET/KITS/TRAYS/PACK) ×4 IMPLANT
SPONGE DRAIN TRACH 4X4 STRL 2S (GAUZE/BANDAGES/DRESSINGS) ×4 IMPLANT
SUT MNCRL AB 4-0 PS2 18 (SUTURE) ×4 IMPLANT
SUT VIC AB 3-0 SH 27 (SUTURE) ×2
SUT VIC AB 3-0 SH 27X BRD (SUTURE) ×2 IMPLANT
SYR 20CC LL (SYRINGE) ×4 IMPLANT
SYR 5ML LL (SYRINGE) ×4 IMPLANT
SYR CONTROL 10ML LL (SYRINGE) ×4 IMPLANT
TAPE PAPER 2X10 WHT MICROPORE (GAUZE/BANDAGES/DRESSINGS) ×4 IMPLANT

## 2017-07-05 NOTE — Transfer of Care (Signed)
Immediate Anesthesia Transfer of Care Note  Patient: Ian Christensen  Procedure(s) Performed: INSERTION PORT-A-CATH ESOPHAGOGASTRODUODENOSCOPY (EGD) WITH PROPOFOL (N/A ) PERCUTANEOUS ENDOSCOPIC GASTROSTOMY (PEG) PLACEMENT (N/A )  Patient Location: PACU  Anesthesia Type:MAC  Level of Consciousness: awake, alert  and oriented  Airway & Oxygen Therapy: Patient Spontanous Breathing  Post-op Assessment: Report given to RN  Post vital signs: Reviewed and stable  Last Vitals:  Vitals Value Taken Time  BP    Temp    Pulse    Resp    SpO2      Last Pain:  Vitals:   07/05/17 0650  TempSrc: Oral  PainSc: 0-No pain      Patients Stated Pain Goal: 8 (91/63/84 6659)  Complications: No apparent anesthesia complications

## 2017-07-05 NOTE — Anesthesia Preprocedure Evaluation (Signed)
Anesthesia Evaluation  Patient identified by MRN, date of birth, ID band Patient awake    Reviewed: Allergy & Precautions, NPO status , Patient's Chart, lab work & pertinent test results  Airway Mallampati: II  TM Distance: >3 FB Neck ROM: Full    Dental no notable dental hx.    Pulmonary neg pulmonary ROS,  H/o Bilat pulm nodules - denies Hemoptysis No pulm meds    Pulmonary exam normal breath sounds clear to auscultation       Cardiovascular Exercise Tolerance: Good hypertension, negative cardio ROS Normal cardiovascular examI Rhythm:Regular Rate:Normal     Neuro/Psych negative neurological ROS  negative psych ROS   GI/Hepatic negative GI ROS, Neg liver ROS, hiatal hernia, GERD  Medicated,Pt with h/o Prostate/Rectal Ca -into bladder  Diverted  Now with Esophageal Ca - ready for more chemo  Here for Port and PEG placement    Endo/Other  negative endocrine ROS  Renal/GU negative Renal ROSBilat renal cysts   negative genitourinary   Musculoskeletal negative musculoskeletal ROS (+) Arthritis , Osteoarthritis,    Abdominal   Peds negative pediatric ROS (+)  Hematology negative hematology ROS (+)   Anesthesia Other Findings   Reproductive/Obstetrics negative OB ROS                             Anesthesia Physical Anesthesia Plan  ASA: III  Anesthesia Plan: MAC   Post-op Pain Management:    Induction: Intravenous  PONV Risk Score and Plan:   Airway Management Planned: Simple Face Mask  Additional Equipment:   Intra-op Plan:   Post-operative Plan:   Informed Consent: I have reviewed the patients History and Physical, chart, labs and discussed the procedure including the risks, benefits and alternatives for the proposed anesthesia with the patient or authorized representative who has indicated his/her understanding and acceptance.     Plan Discussed with: CRNA  Anesthesia  Plan Comments:         Anesthesia Quick Evaluation

## 2017-07-05 NOTE — Op Note (Signed)
Patient:  Ian Christensen  DOB:  12-08-1931  MRN:  [id number]   Preop Diagnosis: Esophageal carcinoma, dysphasia, need for central venous access  Postop Diagnosis: Same  Procedure: Port-A-Cath insertion, EGD with PEG  Surgeon: Aviva Signs, MD  Anes: MAC  Indications: Patient is an 84 year old white male with lower esophageal carcinoma who now presents for Port-A-Cath insertion.  Also due to dysphasia, the patient is undergoing an EGD with gastrostomy tube placement.  The risks and benefits of both procedures including bleeding, infection, pneumothorax, bowel injury, and the possibility of an open procedure were fully explained to the patient, who gave informed consent.  Procedure note: The patient was placed in the supine position.  The left upper chest was prepped and draped using the usual sterile technique with DuraPrep.  Surgical site confirmation was performed.  1% Xylocaine was used for local anesthesia.  An incision was made below the left clavicle.  A subcutaneous pocket was formed.  A needle was advanced into the left subclavian vein while the patient was in Trendelenburg position using the Seldinger technique without difficulty.  A guidewire was then advanced into the right atrium under fluoroscopic guidance.  An introducer and peel-away sheath were placed over the guidewire.  The catheter was then inserted through the peel-away sheath and the peel-away sheath was removed.  The catheter was then attached to the port and the port placed in subcutaneous pocket.  Adequate positioning was confirmed by fluoroscopy.  Good backflow blood was noted on aspiration of the port.  The port was flushed with heparin flush.  The subcutaneous layer was reapproximated using a 3-0 Vicryl interrupted suture.  The skin was closed using a 4-0 Monocryl subcuticular suture.  Dermabond was applied.  Next, an EGD with PEG was performed.  The endoscope was advanced down to the pylorus without difficulty.  I was  able to easily passed by the esophageal tumor.  The pylorus was noted to be widely patent.  An area in the antrum was able to be palpated and transilluminated to the abdominal wall.  1% Xylocaine was used for local anesthesia.  A needle was advanced into the stomach under direct visualization without difficulty.  A guidewire was then passed and this was grasped using the snare.  The endoscope was then removed with the guidewire.  A 20 French gastrostomy tube was then attached to the guidewire.  Using a pull technique, the gastrostomy tube was pulled through the abdominal wall until the bolster was in the stomach.  The bolster was then at placed on the gastrostomy tube at the 2-1/2 cm Brad Lieurance.  The endoscope was passed down into the stomach and the bulb was noted to be in appropriate position in the antrum.  All air was then evacuated from the stomach and esophagus prior to removal of the endoscope.  Betadine ointment was placed around the bolster.  A dry sterile dressing was then applied.  The patient tolerated both procedures well was transferred to PACU in stable condition.  A chest x-ray will be performed at that time.  Complications: None  EBL: Minimal  Specimen: None

## 2017-07-05 NOTE — Progress Notes (Signed)
[place] tolerated chemo tx well without incident. Labs reviewed from yesterday prior to administering chemotherapy medications. Pt had port and PEG placement this morning with both sites clean dry and intact.Tylenol 325 mg, 2 tabs given PO perMD order for complaints of abdominal pain from recent PEG placement. Tylenol effective for pain relief CXR results reviewed prior to accessing portacath.VSS upon discharge. Pt discharged self ambulatory in satisfactory condition accompanied by his daughter

## 2017-07-05 NOTE — Anesthesia Postprocedure Evaluation (Signed)
Anesthesia Post Note  Patient: Ian Christensen  Procedure(s) Performed: INSERTION PORT WITH ATTACHED CATHETER IN LEFT SUBCLAVIAN (Left Chest) ESOPHAGOGASTRODUODENOSCOPY (EGD) WITH PROPOFOL (N/A ) PERCUTANEOUS ENDOSCOPIC GASTROSTOMY (PEG) PLACEMENT (N/A )  Patient location during evaluation: PACU Anesthesia Type: MAC Level of consciousness: awake and alert and oriented Pain management: pain level controlled Vital Signs Assessment: post-procedure vital signs reviewed and stable Respiratory status: spontaneous breathing Cardiovascular status: blood pressure returned to baseline and stable Postop Assessment: no apparent nausea or vomiting Anesthetic complications: no     Last Vitals:  Vitals:   07/05/17 0900 07/05/17 0915  BP: (!) 143/69 (!) 149/75  Pulse: 77 77  Resp: 15 15  Temp:    SpO2: 96% 97%    Last Pain:  Vitals:   07/05/17 0840  TempSrc:   PainSc: 2                  Kaylib Furness

## 2017-07-05 NOTE — Patient Instructions (Signed)
[facility] Discharge Instructions for Patients Receiving Chemotherapy   Beginning January 23rd 2017 lab work for the Cancer Center will be done in the  Main lab at  on 1st floor. If you have a lab appointment with the Cancer Center please come in thru the  Main Entrance and check in at the main information desk   Today you received the following chemotherapy agents Taxol and Carboplatin. Follow-up as scheduled. Call clinic for any questions or concerns  To help prevent nausea and vomiting after your treatment, we encourage you to take your nausea medication.   If you develop nausea and vomiting, or diarrhea that is not controlled by your medication, call the clinic.  The clinic phone number is [phone]. Office hours are Monday-Friday 8:30am-5:00pm.  BELOW ARE SYMPTOMS THAT SHOULD BE REPORTED IMMEDIATELY:  *FEVER GREATER THAN 101.0 F  *CHILLS WITH OR WITHOUT FEVER  NAUSEA AND VOMITING THAT IS NOT CONTROLLED WITH YOUR NAUSEA MEDICATION  *UNUSUAL SHORTNESS OF BREATH  *UNUSUAL BRUISING OR BLEEDING  TENDERNESS IN MOUTH AND THROAT WITH OR WITHOUT PRESENCE OF ULCERS  *URINARY PROBLEMS  *BOWEL PROBLEMS  UNUSUAL RASH Items with * indicate a potential emergency and should be followed up as soon as possible. If you have an emergency after office hours please contact your primary care physician or go to the nearest emergency department.  Please call the clinic during office hours if you have any questions or concerns.   You may also contact the Patient Navigator at [phone] should you have any questions or need assistance in obtaining follow up care.      Resources For Cancer Patients and their Caregivers ? American Cancer Society: Can assist with transportation, wigs, general needs, runs Look Good Feel Better.        1-888-227-6333 ? Cancer Care: Provides financial assistance, online support groups, medication/co-pay assistance.   1-800-813-HOPE (4673) ? [facility] Assists [place] cancer patients and their families through emotional , educational and financial support.  336-427-4357 ? [place] DSS Where to apply for food stamps, Medicaid and utility assistance. 336-342-1394 ? [facility]: Transportation to medical appointments. 336-347-2287 ? Social Security Administration: May apply for disability if have a Stage IV cancer. 336-342-7796 1-800-772-1213 ? [place] Aging, Disability and Transit Services: Assists with nutrition, care and transit needs. 336-349-2343         

## 2017-07-05 NOTE — Interval H&P Note (Signed)
History and Physical Interval Note:  07/05/2017 7:12 AM  Ian Christensen  has presented today for surgery, with the diagnosis of esophagal cancer  The various methods of treatment have been discussed with the patient and family. After consideration of risks, benefits and other options for treatment, the patient has consented to  Procedure(s): INSERTION PORT-A-CATH (Left) ESOPHAGOGASTRODUODENOSCOPY (EGD) WITH PROPOFOL (N/A) PERCUTANEOUS ENDOSCOPIC GASTROSTOMY (PEG) PLACEMENT (N/A) as a surgical intervention .  The patient's history has been reviewed, patient examined, no change in status, stable for surgery.  I have reviewed the patient's chart and labs.  Questions were answered to the patient's satisfaction.     Aviva Signs  Addendum:  Was asked by Oncology to place PEG due to dysphagia, esophageal tumor.

## 2017-07-05 NOTE — Discharge Instructions (Signed)
PEG Tube Home Guide A percutaneous endoscopic gastrostomy (PEG) tube is used to deliver food and fluids directly into the stomach. The tube has a clamp, a cap, and two anchors (bolsters). One bolster keeps the tube from coming out of the stomach. The other bolster holds the tube against the abdomen. You will be taught how to use and adjust your PEG tube before you leave the hospital. You will also be taught how to care for the opening (stoma) in your abdomen. Make sure that you understand:  How to care for your PEG tube.  How to care for your stoma.  How to give yourself feedings and medicines.  When to call your health care provider for help.  How do I care for my peg tube? Check your PEG tube every day. Make sure:  It is not too tight. The bolster should rest gently over the stoma.  It is in the right position. There is a mark on the tube that shows when it is in the right position. Adjust the tube if you need to.  How do I care for my stoma? Clean your stoma every day. Follow these steps: 1. Wash your hands with soap and water. 2. Check your stoma for redness, leaking, or skin irritation. 3. Wash the stoma gently with warm, soapy water. 4. Rinse the stoma with warm water. 5. Pat the stoma area dry. You may place a gauze pad over the opening between the outer bolster and your stoma.  How do I give myself nutritional formula? Your health care provider will give you instructions about:  How much nutrition and fluid you will need for each feeding.  How often to have a feeding.  Whether to take medicine in the tube by itself or with a feeding.  To give yourself a feeding, follow these steps: 1. Lay out all of the equipment that you will need. 2. Make sure that the nutritional formula is at room temperature. 3. Wash your hands with soap and water. 4. Position yourself so that you are upright. You will need to stay upright throughout the feeding and for at least 30 minutes after  the feeding. 5. Make sure the syringe plunger is pushed in. Place the tip of the syringe in clear water, and slowly pull the plunger to bring (draw up) the water into the syringe. 6. Remove the clamp and the cap from the PEG tube. 7. Push the water out of the syringe to clean (flush) the tube. 8. If the tube is clear, draw up the formula into the syringe. Make sure to use the right amount for each feeding and add water if necessary. 9. Slowly push the formula from the syringe through the tube. 10. After the feeding, flush the tube with water. 11. Put the clamp and the cap on the tube.  How do I give myself medicine? To give yourself medicine, follow these steps: 1. Lay out all of the equipment that you will need. 2. If your medicine is in tablet form, crush the tablet and dissolve it in water. 3. Wash your hands with soap and water. 4. Position yourself so that you are upright. You will need to stay upright while you give yourself medicine and for at least 30 minutes afterward. 5. Make sure the syringe plunger is pushed in. Place the tip of the syringe in clear water, and slowly pull the plunger to bring (draw up) the water into the syringe. 6. Remove the clamp and the cap from  the PEG tube. 7. Push the water out of the syringe to clean (flush) the tube. 8. If the tube is clear, draw up the medicine into the syringe. 9. Slowly push the medicine from the syringe through the tube. 10. Flush the tube with water. 11. Put the clamp and the cap on the tube.  You should not take sustained release (SR) medicines through your tube. If you are unsure if your medicine is a SR medicine, ask your health care provider or pharmacist. Contact a health care provider if:  You have soreness, redness, or irritation around your stoma.  You have abdominal pain or bloating during or after your feedings.  You have nausea, constipation, or diarrhea that will not go away.  You have a fever.  You have  problems with your PEG tube. Get help right away if:  Your tube is blocked.  Your tube falls out.  You have pain around your tube.  You are bleeding from your tube.  Your tube is leaking.  You choke or you have trouble breathing during or after a feeding. This information is not intended to replace advice given to you by your health care provider. Make sure you discuss any questions you have with your health care provider. Document Released: 05/21/2014 Document Revised: 06/09/2015 Document Reviewed: 01/09/2014 Elsevier Interactive Patient Education  2018 [place] An implanted port is a type of central line that is placed under the skin. Central lines are used to provide IV access when treatment or nutrition needs to be given through a persons veins. Implanted ports are used for long-term IV access. An implanted port may be placed because:  You need IV medicine that would be irritating to the small veins in your hands or arms.  You need long-term IV medicines, such as antibiotics.  You need IV nutrition for a long period.  You need frequent blood draws for lab tests.  You need dialysis.  Implanted ports are usually placed in the chest area, but they can also be placed in the upper arm, the abdomen, or the leg. An implanted port has two main parts:  Reservoir. The reservoir is round and will appear as a small, raised area under your skin. The reservoir is the part where a needle is inserted to give medicines or draw blood.  Catheter. The catheter is a thin, flexible tube that extends from the reservoir. The catheter is placed into a large vein. Medicine that is inserted into the reservoir goes into the catheter and then into the vein.  How will I care for my incision site? Do not get the incision site wet. Bathe or shower as directed by your health care provider. How is my port accessed? Special steps must be taken to access the port:  Before  the port is accessed, a numbing cream can be placed on the skin. This helps numb the skin over the port site.  Your health care provider uses a sterile technique to access the port. ? Your health care provider must put on a mask and sterile gloves. ? The skin over your port is cleaned carefully with an antiseptic and allowed to dry. ? The port is gently pinched between sterile gloves, and a needle is inserted into the port.  Only "non-coring" port needles should be used to access the port. Once the port is accessed, a blood return should be checked. This helps ensure that the port is in the vein and is not clogged.  If your port needs to remain accessed for a constant infusion, a clear (transparent) bandage will be placed over the needle site. The bandage and needle will need to be changed every week, or as directed by your health care provider.  Keep the bandage covering the needle clean and dry. Do not get it wet. Follow your health care providers instructions on how to take a shower or bath while the port is accessed.  If your port does not need to stay accessed, no bandage is needed over the port.  What is flushing? Flushing helps keep the port from getting clogged. Follow your health care providers instructions on how and when to flush the port. Ports are usually flushed with saline solution or a medicine called heparin. The need for flushing will depend on how the port is used.  If the port is used for intermittent medicines or blood draws, the port will need to be flushed: ? After medicines have been given. ? After blood has been drawn. ? As part of routine maintenance.  If a constant infusion is running, the port may not need to be flushed.  How long will my port stay implanted? The port can stay in for as long as your health care provider thinks it is needed. When it is time for the port to come out, surgery will be done to remove it. The procedure is similar to the one performed  when the port was put in. When should I seek immediate medical care? When you have an implanted port, you should seek immediate medical care if:  You notice a bad smell coming from the incision site.  You have swelling, redness, or drainage at the incision site.  You have more swelling or pain at the port site or the surrounding area.  You have a fever that is not controlled with medicine.  This information is not intended to replace advice given to you by your health care provider. Make sure you discuss any questions you have with your health care provider. Document Released: 01/04/2005 Document Revised: 06/12/2015 Document Reviewed: 09/11/2012 Elsevier Interactive Patient Education  2017 [place] Insertion, Care After This sheet gives you information about how to care for yourself after your procedure. Your health care provider may also give you more specific instructions. If you have problems or questions, contact your health care provider. What can I expect after the procedure? After your procedure, it is common to have:  Discomfort at the port insertion site.  Bruising on the skin over the port. This should improve over 3-4 days.  Follow these instructions at home: [facility] care  After your port is placed, you will get a manufacturer's information card. The card has information about your port. Keep this card with you at all times.  Take care of the port as told by your health care provider. Ask your health care provider if you or a family member can get training for taking care of the port at home. A home health care nurse may also take care of the port.  Make sure to remember what type of port you have. Incision care  Follow instructions from your health care provider about how to take care of your port insertion site. Make sure you: ? Wash your hands with soap and water before you change your bandage (dressing). If soap and water are not available, use hand  sanitizer. ? Change your dressing as told by your health care provider. ? Leave stitches (sutures),  skin glue, or adhesive strips in place. These skin closures may need to stay in place for 2 weeks or longer. If adhesive strip edges start to loosen and curl up, you may trim the loose edges. Do not remove adhesive strips completely unless your health care provider tells you to do that.  Check your port insertion site every day for signs of infection. Check for: ? More redness, swelling, or pain. ? More fluid or blood. ? Warmth. ? Pus or a bad smell. General instructions  Do not take baths, swim, or use a hot tub until your health care provider approves.  Do not lift anything that is heavier than 10 lb (4.5 kg) for a week, or as told by your health care provider.  Ask your health care provider when it is okay to: ? Return to work or school. ? Resume usual physical activities or sports.  Do not drive for 24 hours if you were given a medicine to help you relax (sedative).  Take over-the-counter and prescription medicines only as told by your health care provider.  Wear a medical alert bracelet in case of an emergency. This will tell any health care providers that you have a port.  Keep all follow-up visits as told by your health care provider. This is important. Contact a health care provider if:  You cannot flush your port with saline as directed, or you cannot draw blood from the port.  You have a fever or chills.  You have more redness, swelling, or pain around your port insertion site.  You have more fluid or blood coming from your port insertion site.  Your port insertion site feels warm to the touch.  You have pus or a bad smell coming from the port insertion site. Get help right away if:  You have chest pain or shortness of breath.  You have bleeding from your port that you cannot control. Summary  Take care of the port as told by your health care provider.  Change  your dressing as told by your health care provider.  Keep all follow-up visits as told by your health care provider. This information is not intended to replace advice given to you by your health care provider. Make sure you discuss any questions you have with your health care provider. Document Released: 10/25/2012 Document Revised: 11/26/2015 Document Reviewed: 11/26/2015 Elsevier Interactive Patient Education  2017 [employer].

## 2017-07-06 ENCOUNTER — Telehealth: Payer: Self-pay | Admitting: Radiation Oncology

## 2017-07-06 ENCOUNTER — Ambulatory Visit: Payer: PPO

## 2017-07-06 ENCOUNTER — Encounter: Payer: Self-pay | Admitting: *Deleted

## 2017-07-06 ENCOUNTER — Ambulatory Visit
Admission: RE | Admit: 2017-07-06 | Discharge: 2017-07-06 | Disposition: A | Discharge status: Home / Self Care | Payer: PPO | Source: Ambulatory Visit | Attending: Radiation Oncology | Admitting: Radiation Oncology

## 2017-07-06 ENCOUNTER — Inpatient Hospital Stay: Payer: PPO | Attending: Radiation Oncology | Admitting: Nutrition

## 2017-07-06 DIAGNOSIS — Z510 Encounter for antineoplastic radiation therapy: Secondary | ICD-10-CM | POA: Diagnosis not present

## 2017-07-06 DIAGNOSIS — C155 Malignant neoplasm of lower third of esophagus: Secondary | ICD-10-CM | POA: Diagnosis not present

## 2017-07-06 MED ORDER — MIDAZOLAM HCL 5 MG/5ML IJ SOLN
INTRAMUSCULAR | Status: DC | PRN
  Administered 2017-07-05 (×2): 1 mg via INTRAVENOUS

## 2017-07-06 NOTE — Progress Notes (Signed)
Nutrition follow-up completed with patient and daughters status post EGD with PEG placement. 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 Weight improved and was documented as 181.2 pounds on June 19. Patient reports he is tolerating oatmeal, applesauce, pudding, cream of chicken soup, and Ensure Enlive.  He also enjoys chocolate milk. Reports drinking about 3 Ensure Enlive daily. States swallowing is a bit easier than it was initially.  Estimated nutrition needs: 2025-2400 cal, 101-120 g protein, 2.4 L fluid daily.  Nutrition diagnosis: Inadequate oral intake continues.  Intervention: Patient educated on feeding tube care and how to give bolus feeding. Patient will begin with 1 bottle Osmolite 1.5 every day when he gets home from radiation therapy as well as on weekends. Educated to flush feeding tube with 60 mL free water before and after bolus feeding. He will try to drink 3 bottles of Ensure Enlive daily.  In addition he will continue soft foods as tolerated. Patient demonstrated feeding tube care and bolus tube feeding with good results. Written information was provided and teach back method was used.  Tube feeding goal rate will be 6 bottles Osmolite 1.5+30 mL ProMod twice daily which provides 2330 cal, 109 g protein, and 1086 mL free water.  Monitoring, evaluation, goals: Will monitor patient weight and oral intake and increase tube feeding as needed.  Next visit: Friday, June 21 after radiation therapy.  **Disclaimer: This note was dictated with voice recognition software. Similar sounding words can inadvertently be transcribed and this note may contain transcription errors which may not have been corrected upon publication of note.**

## 2017-07-06 NOTE — Addendum Note (Signed)
Addendum  created 07/06/17 1022 by Ollen Bowl, CRNA   Intraprocedure Meds edited

## 2017-07-06 NOTE — Telephone Encounter (Signed)
I spoke with the patient's daughter Jackelyn Poling to clarify a few issues regarding her dad's symptoms. We discussed risks/benefits, side effect profiles of medications in elderly patients particularly pain medications. He also has issues currently with auditory disturbances but this could be from recent anesthesia or other unrelated issues rather than treatment related. At the end of the discussion, he will try Ultram and Tylenol for dysphagia, or consider 5 ml of Hycet suspension to see how he tolerates this. We also discussed options of viscous lidocaine but I would prefer to avoid this due to confusion we've already encountered about medication given the risks of arrhythmia in overuse of lidocaine. She is in agreement and will keep [place] updated in issues related to his care. I also offered palliative care eval for symptom mgmt but she declines at this time due to the stigma of palliative care despite her understanding of their role and resources.

## 2017-07-07 ENCOUNTER — Ambulatory Visit
Admission: RE | Admit: 2017-07-07 | Discharge: 2017-07-07 | Disposition: A | Discharge status: Home / Self Care | Payer: PPO | Source: Ambulatory Visit | Attending: Radiation Oncology | Admitting: Radiation Oncology

## 2017-07-07 ENCOUNTER — Encounter (HOSPITAL_COMMUNITY): Payer: Self-pay | Admitting: General Surgery

## 2017-07-07 ENCOUNTER — Ambulatory Visit: Payer: PPO

## 2017-07-07 DIAGNOSIS — Z510 Encounter for antineoplastic radiation therapy: Secondary | ICD-10-CM | POA: Diagnosis not present

## 2017-07-07 DIAGNOSIS — C155 Malignant neoplasm of lower third of esophagus: Secondary | ICD-10-CM | POA: Diagnosis not present

## 2017-07-07 NOTE — Progress Notes (Signed)
Oncology Nurse Navigator Documentation  Per request from [place], met with patient and his 2 dtrs to provide PEG education s/p yesterday's placement. Using  PEG teaching device   and Teach Back, provided education for PEG use and care, including: hand hygiene, gravity bolus administration of daily water flushes and nutritional supplement, fluids and medications; care of tube insertion site including daily dressing change and cleaning; S&S of infection.  Ian Christensen correctly verbalized dressing change and cleaning procedures, provided correct return demonstration of gravity administration of water and dressing change with his PEG.  I provided written instructions for PEG flushing/dressing change in support of verbal instruction.  Described contents of Start of Care Bolus Feeding Kit provided by [place].  He and dtrs understand I will be available for ongoing PEG support.  Gayleen Orem, RN, BSN Head & Neck Oncology Nurse [place] [facility] [place] [phone]

## 2017-07-08 ENCOUNTER — Ambulatory Visit: Payer: PPO

## 2017-07-08 ENCOUNTER — Ambulatory Visit
Admission: RE | Admit: 2017-07-08 | Discharge: 2017-07-08 | Disposition: A | Discharge status: Home / Self Care | Payer: PPO | Source: Ambulatory Visit | Attending: Radiation Oncology | Admitting: Radiation Oncology

## 2017-07-08 ENCOUNTER — Encounter: Payer: Self-pay | Admitting: Nutrition

## 2017-07-08 DIAGNOSIS — Z510 Encounter for antineoplastic radiation therapy: Secondary | ICD-10-CM | POA: Diagnosis not present

## 2017-07-08 DIAGNOSIS — C155 Malignant neoplasm of lower third of esophagus: Secondary | ICD-10-CM | POA: Diagnosis not present

## 2017-07-08 NOTE — Progress Notes (Signed)
Brief nutrition follow-up completed with patient before MD appointment. 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 Weight decreased and documented as 178.8 pounds today decreased from 181.2 pounds June 19. Patient reports he drank 3 Ensure Enlive yesterday without difficulty. He also gave himself 1 bottle Osmolite 1.5 via PEG with 60 mL of water before and after his bolus feeding.  Estimated nutrition needs: 2025-2400 cal, 101-120 g protein, 2.4 L fluid daily.  Nutrition diagnosis: Inadequate oral intake continues.  Intervention: Educated patient to continue 3 Ensure Enlive by mouth and 1 Osmolite 1.5 via feeding tube daily. In addition patient will continue to try to eat soft foods as tolerated. Patient was given goal of drinking 3 - 16 ounce bottles of water every day. Encourage patient to bring Ensure Enlive and water to treatment with him so that he can sip on fluids. Questions were answered.  Teach back method used.  Monitoring, evaluation, goals: Patient will tolerate adequate calories and protein to minimize weight loss.  Next visit: Wednesday, June 26 before radiation therapy.  **Disclaimer: This note was dictated with voice recognition software. Similar sounding words can inadvertently be transcribed and this note may contain transcription errors which may not have been corrected upon publication of note.**

## 2017-07-10 NOTE — Progress Notes (Signed)
Radiation Oncology         [phone] ________________________________  Name: Ian Christensen MRN: [id number]  Date: 06/17/2017  DOB: 02-10-1931  SIMULATION AND TREATMENT PLANNING NOTE  DIAGNOSIS:     ICD-10-CM   1. 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  Identity was confirmed.  All relevant records and images related to the planned course of therapy were reviewed.   Written consent to proceed with treatment was confirmed which was freely given after reviewing the details related to the planned course of therapy had been reviewed with the patient.  Then, the patient was set-up in a stable reproducible supine position for radiation therapy.  CT images were obtained.  Surface markings were placed.    Medically necessary complex treatment device(s) for immobilization:  vac-lock bag.   The CT images were loaded into the planning software.  Then the target and avoidance structures were contoured.  Treatment planning then occurred.  The radiation prescription was entered and confirmed. I have requested : Intensity Modulated Radiotherapy (IMRT) is medically necessary for this case for the following reason:  sparing of nearby critical normal structures including the spinal cord, heart, and lungs.   The patient will undergo daily image guidance to ensure accurate localization of the target, and adequate minimize dose to the normal surrounding structures in close proximity to the target.   PLAN:  The patient will receive 50 Gy in 25 fractions initially, also using a simultaneous boost technique to 45 Gy. A boost will then be given to 6 Gy/ 5.4 Gy using a SIB technique to yield 56 Gy to the high dose target and 50.4 Gy to the lower dose target.  Special treatment procedure The patient will also receive concurrent chemotherapy during the treatment. The patient may  therefore experience increased toxicity or side effects and the patient will be monitored for such problems. This may require extra lab work as necessary. This therefore constitutes a special treatment procedure.   ________________________________   Radene Gunning, MD, PhD

## 2017-07-11 ENCOUNTER — Ambulatory Visit (HOSPITAL_COMMUNITY): Payer: PPO

## 2017-07-11 ENCOUNTER — Ambulatory Visit
Admission: RE | Admit: 2017-07-11 | Discharge: 2017-07-11 | Disposition: A | Discharge status: Home / Self Care | Payer: PPO | Source: Ambulatory Visit | Attending: Radiation Oncology | Admitting: Radiation Oncology

## 2017-07-11 ENCOUNTER — Ambulatory Visit: Payer: PPO

## 2017-07-11 ENCOUNTER — Other Ambulatory Visit (HOSPITAL_COMMUNITY): Payer: PPO

## 2017-07-11 ENCOUNTER — Other Ambulatory Visit (HOSPITAL_COMMUNITY): Payer: Self-pay

## 2017-07-11 DIAGNOSIS — C155 Malignant neoplasm of lower third of esophagus: Secondary | ICD-10-CM

## 2017-07-11 DIAGNOSIS — R1310 Dysphagia, unspecified: Secondary | ICD-10-CM | POA: Diagnosis not present

## 2017-07-11 DIAGNOSIS — C159 Malignant neoplasm of esophagus, unspecified: Secondary | ICD-10-CM | POA: Diagnosis not present

## 2017-07-11 DIAGNOSIS — K56609 Unspecified intestinal obstruction, unspecified as to partial versus complete obstruction: Secondary | ICD-10-CM | POA: Diagnosis not present

## 2017-07-11 DIAGNOSIS — Z510 Encounter for antineoplastic radiation therapy: Secondary | ICD-10-CM | POA: Diagnosis not present

## 2017-07-11 DIAGNOSIS — N2889 Other specified disorders of kidney and ureter: Secondary | ICD-10-CM | POA: Diagnosis not present

## 2017-07-12 ENCOUNTER — Inpatient Hospital Stay (HOSPITAL_COMMUNITY): Payer: PPO

## 2017-07-12 ENCOUNTER — Ambulatory Visit
Admission: RE | Admit: 2017-07-12 | Discharge: 2017-07-12 | Disposition: A | Discharge status: Home / Self Care | Payer: PPO | Source: Ambulatory Visit | Attending: Radiation Oncology | Admitting: Radiation Oncology

## 2017-07-12 ENCOUNTER — Other Ambulatory Visit: Payer: Self-pay

## 2017-07-12 ENCOUNTER — Inpatient Hospital Stay (HOSPITAL_COMMUNITY): Payer: PPO | Attending: Hematology | Admitting: Hematology

## 2017-07-12 ENCOUNTER — Encounter (HOSPITAL_COMMUNITY): Payer: Self-pay | Admitting: Hematology

## 2017-07-12 ENCOUNTER — Ambulatory Visit: Payer: PPO

## 2017-07-12 VITALS — BP 112/58 | HR 74 | Temp 97.8°F | Resp 18

## 2017-07-12 DIAGNOSIS — Z85048 Personal history of other malignant neoplasm of rectum, rectosigmoid junction, and anus: Secondary | ICD-10-CM | POA: Insufficient documentation

## 2017-07-12 DIAGNOSIS — C155 Malignant neoplasm of lower third of esophagus: Secondary | ICD-10-CM

## 2017-07-12 DIAGNOSIS — Z8546 Personal history of malignant neoplasm of prostate: Secondary | ICD-10-CM

## 2017-07-12 DIAGNOSIS — Z5111 Encounter for antineoplastic chemotherapy: Secondary | ICD-10-CM | POA: Diagnosis not present

## 2017-07-12 DIAGNOSIS — Z510 Encounter for antineoplastic radiation therapy: Secondary | ICD-10-CM | POA: Diagnosis not present

## 2017-07-12 LAB — COMPREHENSIVE METABOLIC PANEL
ALK PHOS: 67 U/L (ref 38–126)
ALT: 11 U/L (ref 0–44)
ANION GAP: 8 (ref 5–15)
AST: 12 U/L — ABNORMAL LOW (ref 15–41)
Albumin: 3.5 g/dL (ref 3.5–5.0)
BILIRUBIN TOTAL: 0.5 mg/dL (ref 0.3–1.2)
BUN: 24 mg/dL — ABNORMAL HIGH (ref 8–23)
CALCIUM: 9.3 mg/dL (ref 8.9–10.3)
CO2: 28 mmol/L (ref 22–32)
Chloride: 102 mmol/L (ref 98–111)
Creatinine, Ser: 0.91 mg/dL (ref 0.61–1.24)
Glucose, Bld: 141 mg/dL — ABNORMAL HIGH (ref 70–99)
POTASSIUM: 4.4 mmol/L (ref 3.5–5.1)
Sodium: 138 mmol/L (ref 135–145)
Total Protein: 6.9 g/dL (ref 6.5–8.1)

## 2017-07-12 LAB — CBC WITH DIFFERENTIAL/PLATELET
BASOS PCT: 1 %
Basophils Absolute: 0 10*3/uL (ref 0.0–0.1)
Eosinophils Absolute: 0.1 10*3/uL (ref 0.0–0.7)
Eosinophils Relative: 1 %
HEMATOCRIT: 39.7 % (ref 39.0–52.0)
Hemoglobin: 12.8 g/dL — ABNORMAL LOW (ref 13.0–17.0)
LYMPHS ABS: 0.7 10*3/uL (ref 0.7–4.0)
LYMPHS PCT: 13 %
MCH: 30.6 pg (ref 26.0–34.0)
MCHC: 32.2 g/dL (ref 30.0–36.0)
MCV: 95 fL (ref 78.0–100.0)
MONO ABS: 0.7 10*3/uL (ref 0.1–1.0)
MONOS PCT: 14 %
NEUTROS ABS: 3.5 10*3/uL (ref 1.7–7.7)
Neutrophils Relative %: 71 %
Platelets: 237 10*3/uL (ref 150–400)
RBC: 4.18 MIL/uL — ABNORMAL LOW (ref 4.22–5.81)
RDW: 14.6 % (ref 11.5–15.5)
WBC: 5 10*3/uL (ref 4.0–10.5)

## 2017-07-12 MED ORDER — PACLITAXEL CHEMO INJECTION 300 MG/50ML
40.0000 mg/m2 | Freq: Once | INTRAVENOUS | Status: AC
  Administered 2017-07-12: 78 mg via INTRAVENOUS
  Filled 2017-07-12: qty 13

## 2017-07-12 MED ORDER — SODIUM CHLORIDE 0.9 % IV SOLN
150.0000 mg | Freq: Once | INTRAVENOUS | Status: AC
  Administered 2017-07-12: 150 mg via INTRAVENOUS
  Filled 2017-07-12: qty 15

## 2017-07-12 MED ORDER — PALONOSETRON HCL INJECTION 0.25 MG/5ML
INTRAVENOUS | Status: AC
  Filled 2017-07-12: qty 5

## 2017-07-12 MED ORDER — SODIUM CHLORIDE 0.9 % IV SOLN
Freq: Once | INTRAVENOUS | Status: AC
  Administered 2017-07-12: 10:00:00 via INTRAVENOUS

## 2017-07-12 MED ORDER — PALONOSETRON HCL INJECTION 0.25 MG/5ML
0.2500 mg | Freq: Once | INTRAVENOUS | Status: AC
Start: 2017-07-12 — End: 2017-07-12
  Administered 2017-07-12: 0.25 mg via INTRAVENOUS

## 2017-07-12 MED ORDER — DIPHENHYDRAMINE HCL 50 MG/ML IJ SOLN
50.0000 mg | Freq: Once | INTRAMUSCULAR | Status: AC
  Administered 2017-07-12: 50 mg via INTRAVENOUS
  Filled 2017-07-12: qty 1

## 2017-07-12 MED ORDER — DEXAMETHASONE SODIUM PHOSPHATE 100 MG/10ML IJ SOLN
20.0000 mg | Freq: Once | INTRAMUSCULAR | Status: AC
  Administered 2017-07-12: 20 mg via INTRAVENOUS
  Filled 2017-07-12: qty 2

## 2017-07-12 MED ORDER — FAMOTIDINE IN NACL 20-0.9 MG/50ML-% IV SOLN
20.0000 mg | Freq: Once | INTRAVENOUS | Status: AC
  Administered 2017-07-12: 20 mg via INTRAVENOUS
  Filled 2017-07-12: qty 50

## 2017-07-12 MED ORDER — HEPARIN SOD (PORK) LOCK FLUSH 100 UNIT/ML IV SOLN
500.0000 [IU] | Freq: Once | INTRAVENOUS | Status: AC | PRN
  Administered 2017-07-12: 500 [IU]

## 2017-07-12 NOTE — Progress Notes (Signed)
[place], [place] [address]   CLINIC:  Medical Oncology/Hematology  PCP:  [facility], [place] [address] [phone]   REASON FOR VISIT:  Follow-up for esophageal squamous cell carcinoma.  CURRENT THERAPY: Chemoradiation therapy with carboplatin and paclitaxel weekly.  BRIEF ONCOLOGIC HISTORY:    Prostate cancer (Ian Christensen)   12/11/2014 Procedure    Prostatectomy at time of rectal ca resection      12/17/2014 Pathology Results    PROSTATE: PROSTATIC ADENOCARCINOMA WITHOUT TREATMENT EFFECT, GLEASON SCORE 4+4=8 THE TUMOR LOCATED AT THE LEFT ANTERIOR APEX AND THE MID MARGIN OF RESECTION ARE POSITIVE AT THE LEFT ANTERIOR APEX (0.4 CM, GLEASON SCORE 4+4=8) OTHER MARGINS ARE NEGATIVE FOR TUMOR       Rectal cancer (Ian Christensen)   10/02/2014 Procedure    Colonoscopy by Dr. Carlean Purl- One-third circumferential medium mass was found in the distal rectum; ulcerated, firm anterior over prostate bed, multiple biopsies were performed using cold forceps- suspect carcinoma ? rectal vs recurrence of prostate      10/04/2014 Pathology Results    Rectum, biopsy ulcerated mass-invasive adenocarcinoma consistent with colorectal origin.  CDX 2 positive.  PSA negative.      10/07/2014 Initial Diagnosis    Rectal cancer (Ian Christensen)      10/08/2014 Imaging    CT CAP- 1. Wall thickening of the rectum compatible with recently diagnosed rectal carcinoma. Tiny subcentimeter perirectal lymph nodes. 2. Multiple bilateral pulmonary nodules as above. While these may be infectious or inflammatory in etiology, metastatic disease in the setting of known rectal carcinoma is not excluded. 3. Nodular soft tissue within the left mainstem bronchus which is nonspecific and may represent mucus however endobronchial nodule is not excluded. Recommend either attention on followup or correlation with bronchoscopy if the patient is having respiratory  symptoms. 4. No evidence for focal hepatic lesion. 5. Bilateral low-attenuation renal lesions with internal densities greater than that of fluid, indeterminate. Recommend attention on followup or definitive characterization with pre and post contrast-enhanced MRI.      10/10/2014 Procedure    EUS by Dr. Ardis Hughs- Non-circumferential, 3.4cm [phone] (Stage IIIB) rectal adenocarcinoma along the anterior wall of the distal rectum with distal edge 1-2cm from the anal verge.      12/11/2014 Procedure    Segmental resection for tumor by Dr. Leighton Ruff      50/93/2671 Pathology Results    Colon, segmental resection for tumor, Bladder and Prostate COLON: INVASIVE COLONIC ADENOCARCINOMA (3.5 CM IN GREATEST DIMENSION) THE CARCINOMA INVASIVE THROUGH THE MUSCULARIS PROPRIA AND PRESENTS AT THE ANTERIOR MESORECTUM RESECTION MARGIN (1.5 CM, R2) THE PROXIMAL AND DISTAL RESECTION MARGINS ARE NEGATIVE FOR TUMOR LYMPHOVASCULAR INVASION IS IDENTIFED ELEVEN BENIGN LYMPH NODES (0/11) PROSTATE: PROSTATIC ADENOCARCINOMA WITHOUT TREATMENT EFFECT, GLEASON SCORE 4+4=8 THE TUMOR LOCATED AT THE LEFT ANTERIOR APEX AND THE MID MARGIN OF RESECTION ARE POSITIVE AT THE LEFT ANTERIOR APEX (0.4 CM, GLEASON SCORE 4+4=8) OTHER MARGINS ARE NEGATIVE FOR TUMOR BLADDER: BENIGN UROTHELIUM AND SUBCUTANOUS TISSUE NEGATIVE FOR MALIGNANCY      06/20/2015 Imaging    CT CAP- 1. Interval cystoprostatectomy, urinary diversion, abdominal perineal resection and sigmoid colostomy. 2. No evidence of abdominal pelvic metastatic disease. 3. Stable small pulmonary nodules bilaterally. Based on stability over nearly 9 months, these are likely benign. Attention on continued follow-up recommended. 4. Stable low-density renal lesions bilaterally. 5. Moderate atherosclerosis.      12/31/2015 Procedure    Colonoscopy by Dr. Heron Sabins and colostomy in  the sigmoid colon.  Diverticulosis in the left colon, examination otherwise  normal.      06/21/2016 Imaging    CT CAP- Stable exam. No definite evidence of recurrent or metastatic carcinoma.  Stable sub-cm noncalcified bilateral pulmonary nodules, most consistent with benign postinflammatory etiology. Recommend continued attention on follow-up imaging.  Cholelithiasis. No radiographic evidence of cholecystitis.  Aortic and coronary artery atherosclerosis.       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 (Ian Christensen)      06/26/2017 -  Chemotherapy    The patient had palonosetron (ALOXI) injection 0.25 mg, 0.25 mg, Intravenous,  Once, 1 of 1 cycle Administration: 0.25 mg (06/27/2017), 0.25 mg (07/05/2017), 0.25 mg (07/12/2017) CARBOplatin (PARAPLATIN) 150 mg in sodium chloride 0.9 % 100 mL chemo infusion, 150 mg (100 % of original dose 150 mg), Intravenous,  Once, 1 of 1 cycle Dose modification: 150 mg (original dose 150 mg, Cycle 1, Reason: Provider Judgment), 125 mg (original dose 125 mg, Cycle 1, Reason: Provider Judgment),   (original dose 150 mg, Cycle 1), 150 mg (original dose 150 mg, Cycle 1, Reason: Provider Judgment) Administration: 150 mg (06/27/2017), 130 mg (07/05/2017), 150 mg (07/12/2017) PACLitaxel (TAXOL) 78 mg in sodium chloride 0.9 % 250 mL chemo infusion (</= 80mg /m2), 40 mg/m2 = 78 mg (80 % of original dose 50 mg/m2), Intravenous,  Once, 1 of 1 cycle Dose modification: 40 mg/m2 (80 % of original dose 50 mg/m2, Cycle 1, Reason: Provider Judgment), 40 mg/m2 (80 % of original dose 50 mg/m2, Cycle 1, Reason: Provider Judgment) Administration: 78 mg (06/27/2017), 78 mg (07/05/2017), 78 mg (07/12/2017)  for chemotherapy treatment.         CANCER STAGING: Cancer Staging Rectal cancer Ian Christensen) Staging form: Colon and Rectum, AJCC 8th Edition - Pathologic stage from 05/31/2016: Stage IIA (pT3, pN0, cM0) - Signed by Baird Cancer, PA-C on  05/31/2016    INTERVAL HISTORY:  Ian Christensen 84 y.o. male returns for follow-up of his esophageal cancer and week 3 of chemotherapy.  He had a PEG tube placed last Tuesday.  He is putting in 1 can of Osmolite 1.5 daily.  He is able to drink by mouth and drinks about 3 ensures per day.  He has trouble eating solid foods.  He had constipation lasted one day.  He is taking MiraLAX daily which is helping it.  Denies any worsening of his baseline numbness in the hands.  Denies any new onset numbness in the feet.  Denies any nausea or vomiting.  He does not report any ringing in the years.   REVIEW OF SYSTEMS:  Review of Systems  Constitutional: Positive for fatigue.  HENT:   Positive for trouble swallowing.   Respiratory: Positive for cough.   Gastrointestinal: Positive for constipation.  Hematological: Bruises/bleeds easily.  All other systems reviewed and are negative.    PAST MEDICAL/SURGICAL HISTORY:  Past Medical History:  Diagnosis Date  . Arthritis   . Colostomy in place [facility])   . Esophageal cancer (Ian Christensen)   . GERD (gastroesophageal reflux disease)   . History of colon polyps   . History of hiatal hernia   . 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 HTN (hypertension)    followed by pcp  . Hyperlipidemia   .  Presence of urostomy ([place])   . Rectal cancer ([place]) 10/07/2014   no chemo or radiation/ bladder was removed also  . Tinnitus    per pt intermittant   . Wears glasses    Past Surgical History:  Procedure Laterality Date  . APPENDECTOMY  1955  . COLONOSCOPY    . CYSTO N/A 12/11/2014   Procedure:  CYSTO,  PROSTATECTOMY, CYSTECTOMY colon conduit, BILATERAL LYMPH NODE DISSECTION;  Surgeon: Alexis Frock, MD;  Location: [facility];  Service: Urology;  Laterality: N/A;  . ESOPHAGOGASTRODUODENOSCOPY (EGD) WITH PROPOFOL N/A 07/05/2017   Procedure: ESOPHAGOGASTRODUODENOSCOPY  (EGD) WITH PROPOFOL;  Surgeon: Aviva Signs, MD;  Location: [facility];  Service: General;  Laterality: N/A;  . EUS N/A 10/10/2014   Procedure: LOWER ENDOSCOPIC ULTRASOUND (EUS);  Surgeon: Milus Banister, MD;  Location: [facility];  Service: Endoscopy;  Laterality: N/A;  . HERNIA REPAIR    . INCISIONAL HERNIA REPAIR N/A 03/02/2017   Procedure: INCISIONAL HERNIA REPAIR WITH MESH;  Surgeon: Leighton Ruff, MD;  Location: [facility];  Service: General;  Laterality: N/A;  . INSERTION OF MESH  03/02/2017   Procedure: INSERTION OF MESH;  Surgeon: Leighton Ruff, MD;  Location: [facility];  Service: General;;  . LAPAROSCOPIC INTERNAL HERNIA REPAIR N/A 03/06/2017   Procedure: REOPENING OF RECENT LAPAROTOMY WITH CLOSURE OF INCISIONAL HERNIA WITH MESH;  Surgeon: Leighton Ruff, MD;  Location: [facility];  Service: General;  Laterality: N/A;  . PEG PLACEMENT N/A 07/05/2017   Procedure: PERCUTANEOUS ENDOSCOPIC GASTROSTOMY (PEG) PLACEMENT;  Surgeon: Aviva Signs, MD;  Location: [facility];  Service: General;  Laterality: N/A;  . PORTACATH PLACEMENT Left 07/05/2017   Procedure: INSERTION PORT WITH ATTACHED CATHETER IN LEFT SUBCLAVIAN;  Surgeon: Aviva Signs, MD;  Location: [facility];  Service: General;  Laterality: Left;  . RADIOACTIVE PROSTATE SEED IMPLANTS  02-06-1999  dr Jeffie Pollock  [facility]  . XI ROBOTIC ASSISTED LOWER ANTERIOR RESECTION N/A 12/11/2014   Procedure: XI ROBOTIC ASSISTED ABDOMINAL PERINEAL RESECTION OMENTOPLASTY;  Surgeon: Leighton Ruff, MD;  Location: [facility];  Service: General;  Laterality: N/A;     SOCIAL HISTORY:  Social History   Socioeconomic History  . Marital status: Widowed    Spouse name: Not on file  . Number of children: 2  . Years of education: Not on file  . Highest education level: Not on file  Occupational History  . Occupation: retired  Scientific laboratory technician  . Financial resource strain: Not very hard  . Food insecurity:    Worry: Never true    Inability: Never true  . Transportation needs:     Medical: No    Non-medical: No  Tobacco Use  . Smoking status: Never Smoker  . Smokeless tobacco: Never Used  Substance and Sexual Activity  . Alcohol use: Yes    Alcohol/week: 0.0 oz    Comment: seldom  . Drug use: No  . Sexual activity: Not Currently    Comment: Widowed  Lifestyle  . Physical activity:    Days per week: 1 day    Minutes per session: 30 min  . Stress: Not at all  Relationships  . Social connections:    Talks on phone: Three times a week    Gets together: Three times a week    Attends religious service: More than 4 times per year    Active member of club or organization: Yes    Attends meetings of clubs or organizations: More than 4 times per year    Relationship status: Widowed  .  Intimate partner violence:    Fear of current or ex partner: No    Emotionally abused: No    Physically abused: No    Forced sexual activity: No  Other Topics Concern  . Not on file  Social History Narrative  . Not on file    FAMILY HISTORY:  Family History  Problem Relation Age of Onset  . Diabetes Mother   . Heart disease Mother   . Heart disease Father   . Colon cancer Sister   . Prostate cancer Brother   . Esophageal cancer Neg Hx   . Rectal cancer Neg Hx   . Stomach cancer Neg Hx     CURRENT MEDICATIONS:  Outpatient Encounter Medications as of 07/12/2017  Medication Sig  . ACETAMINOPHEN PO Take 650 mg by mouth every 8 (eight) hours as needed (for pain).   Marland Kitchen aspirin EC 81 MG tablet Take 162 mg by mouth at bedtime. Takes 30 minutes prior to Niacin  . cyanocobalamin 500 MCG tablet Take 500 mcg by mouth daily. Vitamin B-12  . HYDROcodone-acetaminophen (HYCET) 7.5-325 mg/15 ml solution Take 10-15 mLs by mouth 4 (four) times daily as needed for moderate pain.  Marland Kitchen lidocaine-prilocaine (EMLA) cream Apply a quarter size amount to port site 1 hour prior to chemo. Do not rub in. Cover with plastic wrap.  . losartan (COZAAR) 25 MG tablet Take 25 mg by mouth daily.  .  metoprolol succinate (TOPROL-XL) 25 MG 24 hr tablet Take 25 mg by mouth 2 (two) times daily.  . niacin (NIASPAN) 500 MG CR tablet Take 500 mg by mouth every evening.   Marland Kitchen omeprazole (PRILOSEC) 20 MG capsule Take 20 mg by mouth every evening.   . prochlorperazine (COMPAZINE) 10 MG tablet Take 1 tablet (10 mg total) by mouth every 6 (six) hours as needed for nausea or vomiting.  . simvastatin (ZOCOR) 20 MG tablet Take 20 mg by mouth every evening.   . sucralfate (CARAFATE) 1 g tablet Crush and mix in 1 oz of water q AC and HS.  . traMADol (ULTRAM) 50 MG tablet Take 50 mg by mouth every 6 (six) hours as needed for moderate pain.  . [EXPIRED] 0.9 %  sodium chloride infusion   . [EXPIRED] CARBOplatin (PARAPLATIN) 150 mg in sodium chloride 0.9 % 100 mL chemo infusion   . [EXPIRED] dexamethasone (DECADRON) 20 mg in sodium chloride 0.9 % 50 mL IVPB   . [EXPIRED] diphenhydrAMINE (BENADRYL) injection 50 mg   . [EXPIRED] famotidine (PEPCID) IVPB 20 mg premix   . [EXPIRED] heparin lock flush 100 unit/mL   . [EXPIRED] PACLitaxel (TAXOL) 78 mg in sodium chloride 0.9 % 250 mL chemo infusion (</= 80mg /m2)   . [EXPIRED] palonosetron (ALOXI) injection 0.25 mg    No facility-administered encounter medications on file as of 07/12/2017.     ALLERGIES:  Allergies  Allergen Reactions  . Morphine And Related     Caused hallucinations.  . Oxycodone     Caused hallucinations     PHYSICAL EXAM:  ECOG Performance status: 1  Vitals:   07/12/17 1003  BP: (!) 142/68  Pulse: 85  Resp: 16  Temp: 98.1 F (36.7 C)  SpO2: 96%   Filed Weights   07/12/17 1003  Weight: 177 lb 4.8 oz (80.4 kg)    Physical Exam   LABORATORY DATA:  I have reviewed the labs as listed.  CBC    Component Value Date/Time   WBC 5.0 07/12/2017 0812   RBC 4.18 (  L) 07/12/2017 0812   HGB 12.8 (L) 07/12/2017 0812   HCT 39.7 07/12/2017 0812   PLT 237 07/12/2017 0812   MCV 95.0 07/12/2017 0812   MCH 30.6 07/12/2017 0812   MCHC  32.2 07/12/2017 0812   RDW 14.6 07/12/2017 0812   LYMPHSABS 0.7 07/12/2017 0812   MONOABS 0.7 07/12/2017 0812   EOSABS 0.1 07/12/2017 0812   BASOSABS 0.0 07/12/2017 0812   CMP Latest Ref Rng & Units 07/12/2017 07/04/2017 06/27/2017  Glucose 70 - 99 mg/dL 141(H) 161(H) 144(H)  BUN 8 - 23 mg/dL 24(H) 23(H) 16  Creatinine 0.61 - 1.24 mg/dL 0.91 0.85 1.01  Sodium 135 - 145 mmol/L 138 138 140  Potassium 3.5 - 5.1 mmol/L 4.4 4.1 3.8  Chloride 98 - 111 mmol/L 102 104 106  CO2 22 - 32 mmol/L 28 27 26   Calcium 8.9 - 10.3 mg/dL 9.3 9.2 9.7  Total Protein 6.5 - 8.1 g/dL 6.9 6.6 7.2  Total Bilirubin 0.3 - 1.2 mg/dL 0.5 0.6 0.7  Alkaline Phos 38 - 126 U/L 67 74 66  AST 15 - 41 U/L 12(L) 18 17  ALT 0 - 44 U/L 11 25 14(L)         ASSESSMENT & PLAN:   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 - Barium swallow done at [facility] showed a large mass in the distal esophagus. - PET/CT scan on 1/75/1025 hypermetabolic solid 3.5 x 2.3 cm esophageal mass in the mid to lower third of the thoracic esophagus with max SUV 7.3 and nonspecific mildly hypermetabolic nonenlarged bilateral hilar lymph nodes, indeterminate.  We discussed this with the patient in detail. - EGD on 05/26/2017 shows large fungating mass with no bleeding, 30 cm from incisors, extends to 35 cm, partially obstructing, biopsy consistent with squamous cell carcinoma. - Started on combination chemoradiation therapy with weekly carboplatin and paclitaxel on 06/27/2017.  He has developed worsening of difficulty swallowing.  He had a PEG tube placed on 07/05/2017 along with the Port-A-Cath on the same day.  He is taking and 1 can of Osmolite 1.5/day.  He is able to drink 3 ensures per day.  He is also eating soft foods. -He will proceed with his third weekly dose of carboplatin and paclitaxel.  He does not  report any ringing in his ears.  He does not report any new onset tingling or numbness.  I will reevaluate him again in 1 week.  2.  Colorectal cancer: - Underwent segmental colon resection in November 2016, found to have a T3N0 adenocarcinoma, with circumferential margins positive.  Patient did not receive any neoadjuvant or adjuvant chemotherapy/radiation therapy.  3.  Prostate cancer: -Originally diagnosed in early 2000s, status post seed implants.  Underwent cystoprostatectomy in November 2016, pathology showed prostatic adenocarcinoma Gleason 8 (4+4).      Orders placed this encounter:  No orders of the defined types were placed in this encounter.     Derek Jack, MD [place] [phone]

## 2017-07-12 NOTE — Progress Notes (Signed)
Tolerated infusions w/o adverse reaction.  Alert, in no distress.  VSS.  Discharged via wheelchair in c/o family.  

## 2017-07-12 NOTE — Assessment & Plan Note (Signed)
1.  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 - Barium swallow done at [facility] showed a large mass in the distal esophagus. - PET/CT scan on 8/54/6270 hypermetabolic solid 3.5 x 2.3 cm esophageal mass in the mid to lower third of the thoracic esophagus with max SUV 7.3 and nonspecific mildly hypermetabolic nonenlarged bilateral hilar lymph nodes, indeterminate.  We discussed this with the patient in detail. - EGD on 05/26/2017 shows large fungating mass with no bleeding, 30 cm from incisors, extends to 35 cm, partially obstructing, biopsy consistent with squamous cell carcinoma. - Started on combination chemoradiation therapy with weekly carboplatin and paclitaxel on 06/27/2017.  He has developed worsening of difficulty swallowing.  He had a PEG tube placed on 07/05/2017 along with the Port-A-Cath on the same day.  He is taking and 1 can of Osmolite 1.5/day.  He is able to drink 3 ensures per day.  He is also eating soft foods. -He will proceed with his third weekly dose of carboplatin and paclitaxel.  He does not report any ringing in his ears.  He does not report any new onset tingling or numbness.  I will reevaluate him again in 1 week.  2.  Colorectal cancer: - Underwent segmental colon resection in November 2016, found to have a T3N0 adenocarcinoma, with circumferential margins positive.  Patient did not receive any neoadjuvant or adjuvant chemotherapy/radiation therapy.  3.  Prostate cancer: -Originally diagnosed in early 2000s, status post seed implants.  Underwent cystoprostatectomy in November 2016, pathology showed prostatic adenocarcinoma Gleason 8 (4+4).

## 2017-07-13 ENCOUNTER — Inpatient Hospital Stay: Payer: PPO | Admitting: Nutrition

## 2017-07-13 ENCOUNTER — Ambulatory Visit: Payer: PPO

## 2017-07-13 ENCOUNTER — Ambulatory Visit
Admission: RE | Admit: 2017-07-13 | Discharge: 2017-07-13 | Disposition: A | Discharge status: Home / Self Care | Payer: PPO | Source: Ambulatory Visit | Attending: Radiation Oncology | Admitting: Radiation Oncology

## 2017-07-13 DIAGNOSIS — Z510 Encounter for antineoplastic radiation therapy: Secondary | ICD-10-CM | POA: Diagnosis not present

## 2017-07-13 DIAGNOSIS — C155 Malignant neoplasm of lower third of esophagus: Secondary | ICD-10-CM | POA: Diagnosis not present

## 2017-07-13 NOTE — Progress Notes (Signed)
Nutrition follow-up completed with patient prior to radiation therapy. Patient is receiving concurrent chemoradiation therapy for squamous cell carcinoma of the distal esophagus. Weight documented as 179 pounds today, increased from 177 pounds. Reviewed food record which patient brought to appointment today.  It appears patient is consuming approximately 500 cal through foods like oatmeal, pudding, cream soups and popsicles.  He reports he is also tried mashed potatoes and gravy with success. Reports some taste alterations. He is getting a bit tired of his food choices. He is consistently drinking 3 Ensure Enlive by mouth and using 1 bottle Osmolite 1.5 via feeding tube every day. (~1405 kcal, 75 g pro) Patient denies nausea.  Reports constipation is controlled.  Nutrition diagnosis: Inadequate oral intake continues.  Intervention: Patient will continue Ensure Enlive 3 times daily and soft pured foods at mealtimes. He is willing to try pured fruit to increase variety. He will continue 1 bottle of Osmolite 1.5 via PEG along with 60 mL free water before and after bolus feeding. Patient will continue to try to increase water intake.  Monitoring, evaluation, goals: I will continue to monitor patient's weight and increase tube feedings as needed.  Next visit: Tuesday at 1:45 prior to radiation therapy.  **Disclaimer: This note was dictated with voice recognition software. Similar sounding words can inadvertently be transcribed and this note may contain transcription errors which may not have been corrected upon publication of note.**

## 2017-07-14 ENCOUNTER — Ambulatory Visit
Admission: RE | Admit: 2017-07-14 | Discharge: 2017-07-14 | Disposition: A | Discharge status: Home / Self Care | Payer: PPO | Source: Ambulatory Visit | Attending: Radiation Oncology | Admitting: Radiation Oncology

## 2017-07-14 ENCOUNTER — Ambulatory Visit: Payer: PPO

## 2017-07-14 DIAGNOSIS — Z933 Colostomy status: Secondary | ICD-10-CM | POA: Diagnosis not present

## 2017-07-14 DIAGNOSIS — C155 Malignant neoplasm of lower third of esophagus: Secondary | ICD-10-CM | POA: Diagnosis not present

## 2017-07-14 DIAGNOSIS — Z936 Other artificial openings of urinary tract status: Secondary | ICD-10-CM | POA: Diagnosis not present

## 2017-07-14 DIAGNOSIS — Z510 Encounter for antineoplastic radiation therapy: Secondary | ICD-10-CM | POA: Diagnosis not present

## 2017-07-14 DIAGNOSIS — C20 Malignant neoplasm of rectum: Secondary | ICD-10-CM | POA: Diagnosis not present

## 2017-07-15 ENCOUNTER — Ambulatory Visit: Payer: PPO

## 2017-07-15 ENCOUNTER — Ambulatory Visit
Admission: RE | Admit: 2017-07-15 | Discharge: 2017-07-15 | Disposition: A | Discharge status: Home / Self Care | Payer: PPO | Source: Ambulatory Visit | Attending: Radiation Oncology | Admitting: Radiation Oncology

## 2017-07-15 DIAGNOSIS — Z510 Encounter for antineoplastic radiation therapy: Secondary | ICD-10-CM | POA: Diagnosis not present

## 2017-07-15 DIAGNOSIS — C155 Malignant neoplasm of lower third of esophagus: Secondary | ICD-10-CM | POA: Diagnosis not present

## 2017-07-18 ENCOUNTER — Ambulatory Visit: Payer: PPO

## 2017-07-18 ENCOUNTER — Ambulatory Visit
Admission: RE | Admit: 2017-07-18 | Discharge: 2017-07-18 | Disposition: A | Discharge status: Home / Self Care | Payer: PPO | Source: Ambulatory Visit | Attending: Radiation Oncology | Admitting: Radiation Oncology

## 2017-07-18 ENCOUNTER — Other Ambulatory Visit (HOSPITAL_COMMUNITY): Payer: Self-pay

## 2017-07-18 DIAGNOSIS — Z8546 Personal history of malignant neoplasm of prostate: Secondary | ICD-10-CM | POA: Diagnosis not present

## 2017-07-18 DIAGNOSIS — C155 Malignant neoplasm of lower third of esophagus: Secondary | ICD-10-CM

## 2017-07-18 DIAGNOSIS — C19 Malignant neoplasm of rectosigmoid junction: Secondary | ICD-10-CM | POA: Diagnosis not present

## 2017-07-18 DIAGNOSIS — C61 Malignant neoplasm of prostate: Secondary | ICD-10-CM

## 2017-07-18 DIAGNOSIS — Z510 Encounter for antineoplastic radiation therapy: Secondary | ICD-10-CM | POA: Diagnosis not present

## 2017-07-18 DIAGNOSIS — C20 Malignant neoplasm of rectum: Secondary | ICD-10-CM

## 2017-07-19 ENCOUNTER — Inpatient Hospital Stay: Payer: PPO | Admitting: Nutrition

## 2017-07-19 ENCOUNTER — Ambulatory Visit
Admission: RE | Admit: 2017-07-19 | Discharge: 2017-07-19 | Disposition: A | Discharge status: Home / Self Care | Payer: PPO | Source: Ambulatory Visit | Attending: Radiation Oncology | Admitting: Radiation Oncology

## 2017-07-19 ENCOUNTER — Inpatient Hospital Stay (HOSPITAL_COMMUNITY): Payer: PPO | Attending: Hematology

## 2017-07-19 ENCOUNTER — Ambulatory Visit: Payer: PPO

## 2017-07-19 ENCOUNTER — Other Ambulatory Visit: Payer: Self-pay

## 2017-07-19 ENCOUNTER — Encounter (HOSPITAL_COMMUNITY): Payer: Self-pay | Admitting: Hematology

## 2017-07-19 ENCOUNTER — Inpatient Hospital Stay (HOSPITAL_COMMUNITY): Payer: PPO

## 2017-07-19 ENCOUNTER — Encounter (HOSPITAL_COMMUNITY): Payer: Self-pay | Admitting: General Practice

## 2017-07-19 ENCOUNTER — Inpatient Hospital Stay: Payer: PPO | Attending: Radiation Oncology | Admitting: Nutrition

## 2017-07-19 ENCOUNTER — Inpatient Hospital Stay (HOSPITAL_BASED_OUTPATIENT_CLINIC_OR_DEPARTMENT_OTHER): Payer: PPO | Admitting: Hematology

## 2017-07-19 VITALS — BP 128/65 | HR 75 | Temp 97.9°F | Resp 18 | Wt 174.4 lb

## 2017-07-19 DIAGNOSIS — Z931 Gastrostomy status: Secondary | ICD-10-CM | POA: Diagnosis not present

## 2017-07-19 DIAGNOSIS — K5900 Constipation, unspecified: Secondary | ICD-10-CM | POA: Diagnosis not present

## 2017-07-19 DIAGNOSIS — Z5111 Encounter for antineoplastic chemotherapy: Secondary | ICD-10-CM | POA: Insufficient documentation

## 2017-07-19 DIAGNOSIS — R634 Abnormal weight loss: Secondary | ICD-10-CM | POA: Diagnosis not present

## 2017-07-19 DIAGNOSIS — C20 Malignant neoplasm of rectum: Secondary | ICD-10-CM

## 2017-07-19 DIAGNOSIS — Z85048 Personal history of other malignant neoplasm of rectum, rectosigmoid junction, and anus: Secondary | ICD-10-CM | POA: Diagnosis not present

## 2017-07-19 DIAGNOSIS — C61 Malignant neoplasm of prostate: Secondary | ICD-10-CM

## 2017-07-19 DIAGNOSIS — C155 Malignant neoplasm of lower third of esophagus: Secondary | ICD-10-CM | POA: Insufficient documentation

## 2017-07-19 DIAGNOSIS — R070 Pain in throat: Secondary | ICD-10-CM | POA: Diagnosis not present

## 2017-07-19 DIAGNOSIS — R5383 Other fatigue: Secondary | ICD-10-CM | POA: Diagnosis not present

## 2017-07-19 DIAGNOSIS — Z8546 Personal history of malignant neoplasm of prostate: Secondary | ICD-10-CM | POA: Insufficient documentation

## 2017-07-19 DIAGNOSIS — Z510 Encounter for antineoplastic radiation therapy: Secondary | ICD-10-CM | POA: Diagnosis not present

## 2017-07-19 LAB — CBC WITH DIFFERENTIAL/PLATELET
BASOS PCT: 1 %
Basophils Absolute: 0 10*3/uL (ref 0.0–0.1)
EOS ABS: 0.1 10*3/uL (ref 0.0–0.7)
Eosinophils Relative: 2 %
HCT: 39.1 % (ref 39.0–52.0)
Hemoglobin: 12.9 g/dL — ABNORMAL LOW (ref 13.0–17.0)
Lymphocytes Relative: 15 %
Lymphs Abs: 0.6 10*3/uL — ABNORMAL LOW (ref 0.7–4.0)
MCH: 31.2 pg (ref 26.0–34.0)
MCHC: 33 g/dL (ref 30.0–36.0)
MCV: 94.4 fL (ref 78.0–100.0)
MONOS PCT: 12 %
Monocytes Absolute: 0.4 10*3/uL (ref 0.1–1.0)
NEUTROS ABS: 2.7 10*3/uL (ref 1.7–7.7)
NEUTROS PCT: 70 %
Platelets: 221 10*3/uL (ref 150–400)
RBC: 4.14 MIL/uL — AB (ref 4.22–5.81)
RDW: 14.9 % (ref 11.5–15.5)
WBC: 3.8 10*3/uL — AB (ref 4.0–10.5)

## 2017-07-19 LAB — COMPREHENSIVE METABOLIC PANEL
ALBUMIN: 3.3 g/dL — AB (ref 3.5–5.0)
ALK PHOS: 66 U/L (ref 38–126)
ALT: 20 U/L (ref 0–44)
ANION GAP: 10 (ref 5–15)
AST: 19 U/L (ref 15–41)
BILIRUBIN TOTAL: 0.5 mg/dL (ref 0.3–1.2)
BUN: 26 mg/dL — ABNORMAL HIGH (ref 8–23)
CO2: 24 mmol/L (ref 22–32)
Calcium: 9 mg/dL (ref 8.9–10.3)
Chloride: 104 mmol/L (ref 98–111)
Creatinine, Ser: 0.82 mg/dL (ref 0.61–1.24)
GFR calc Af Amer: >60 mL/min (ref >60)
GFR calc non Af Amer: >60 mL/min (ref >60)
GLUCOSE: 128 mg/dL — AB (ref 70–99)
Potassium: 4 mmol/L (ref 3.5–5.1)
Sodium: 138 mmol/L (ref 135–145)
TOTAL PROTEIN: 6.6 g/dL (ref 6.5–8.1)

## 2017-07-19 MED ORDER — SODIUM CHLORIDE 0.9 % IV SOLN
150.0000 mg | Freq: Once | INTRAVENOUS | Status: AC
  Administered 2017-07-19: 150 mg via INTRAVENOUS
  Filled 2017-07-19: qty 15

## 2017-07-19 MED ORDER — DIPHENHYDRAMINE HCL 50 MG/ML IJ SOLN
50.0000 mg | Freq: Once | INTRAMUSCULAR | Status: AC
  Administered 2017-07-19: 50 mg via INTRAVENOUS

## 2017-07-19 MED ORDER — SODIUM CHLORIDE 0.9 % IV SOLN
40.0000 mg/m2 | Freq: Once | INTRAVENOUS | Status: AC
  Administered 2017-07-19: 78 mg via INTRAVENOUS
  Filled 2017-07-19: qty 13

## 2017-07-19 MED ORDER — TRAMADOL HCL 50 MG PO TABS: 50.0000 mg | ORAL_TABLET | Freq: Two times a day (BID) | ORAL | 0 refills | Status: DC

## 2017-07-19 MED ORDER — HEPARIN SOD (PORK) LOCK FLUSH 100 UNIT/ML IV SOLN
500.0000 [IU] | Freq: Once | INTRAVENOUS | Status: AC | PRN
  Administered 2017-07-19: 500 [IU]
  Filled 2017-07-19: qty 5

## 2017-07-19 MED ORDER — DIPHENHYDRAMINE HCL 50 MG/ML IJ SOLN
INTRAMUSCULAR | Status: AC
  Filled 2017-07-19: qty 1

## 2017-07-19 MED ORDER — FAMOTIDINE IN NACL 20-0.9 MG/50ML-% IV SOLN
INTRAVENOUS | Status: AC
  Filled 2017-07-19: qty 50

## 2017-07-19 MED ORDER — PALONOSETRON HCL INJECTION 0.25 MG/5ML
0.2500 mg | Freq: Once | INTRAVENOUS | Status: AC
  Administered 2017-07-19: 0.25 mg via INTRAVENOUS

## 2017-07-19 MED ORDER — FAMOTIDINE IN NACL 20-0.9 MG/50ML-% IV SOLN
20.0000 mg | Freq: Once | INTRAVENOUS | Status: AC
  Administered 2017-07-19: 20 mg via INTRAVENOUS

## 2017-07-19 MED ORDER — PALONOSETRON HCL INJECTION 0.25 MG/5ML
INTRAVENOUS | Status: AC
  Filled 2017-07-19: qty 5

## 2017-07-19 MED ORDER — DEXAMETHASONE SODIUM PHOSPHATE 100 MG/10ML IJ SOLN
20.0000 mg | Freq: Once | INTRAMUSCULAR | Status: AC
  Administered 2017-07-19: 20 mg via INTRAVENOUS
  Filled 2017-07-19: qty 2

## 2017-07-19 MED ORDER — SODIUM CHLORIDE 0.9 % IV SOLN
Freq: Once | INTRAVENOUS | Status: AC
  Administered 2017-07-19: 10:00:00 via INTRAVENOUS

## 2017-07-19 NOTE — Progress Notes (Signed)
Nutrition follow-up completed with patient and caregiver after radiation therapy. Patient is receiving concurrent chemoradiation therapy for squamous cell carcinoma of the distal esophagus. Weight decreased and documented as 176 pounds today decreased from 179 pounds on June 26. Noted labs glucose 128, BUN 26, and albumin 3.3. Patient reports he is giving 1 bottle Osmolite 1.5 via PEG daily. Also reports drinking 3 Ensure Enlive daily. He is eating very little food by mouth but continues to try to eat oatmeal, pudding, cream soups, and ice cream. He recently received a blender and is willing to try pured foods. Worked with patient to develop a sample meal pattern for him to follow to ensure adequate nutrition.  Nutrition diagnosis: Inadequate oral intake continues.  Intervention: Patient will consume 6 small meals and snacks daily including soft pured foods, Ensure Enlive, and Osmolite 1.5. Educated caregiver on [place] for patient.  Caregiver will label and place in refrigerator for patient to heat and eat. Provided 1 additional complementary case of Ensure Enlive. Provided some sample canned food items for patient so he does not have to stop at the grocery store on the way home. Questions were answered.  Teach back method used.  Monitoring, evaluation, goals: Patient will increase calories and protein to minimize further weight loss.  Next visit: Wednesday, July 10.  **Disclaimer: This note was dictated with voice recognition software. Similar sounding words can inadvertently be transcribed and this note may contain transcription errors which may not have been corrected upon publication of note.**

## 2017-07-19 NOTE — Progress Notes (Signed)
Ian Christensen, [place] [address]   CLINIC:  Medical Oncology/Hematology  PCP:  [facility], [place] [address] [phone]   REASON FOR VISIT:  Follow-up for esophageal squamous cell carcinoma   CURRENT THERAPY: Chemoradiation therapy with Carboplatin and Paclitaxel  BRIEF ONCOLOGIC HISTORY:    Prostate cancer ([place])   12/11/2014 Procedure    Prostatectomy at time of rectal ca resection      12/17/2014 Pathology Results    PROSTATE: PROSTATIC ADENOCARCINOMA WITHOUT TREATMENT EFFECT, GLEASON SCORE 4+4=8 THE TUMOR LOCATED AT THE LEFT ANTERIOR APEX AND THE MID MARGIN OF RESECTION ARE POSITIVE AT THE LEFT ANTERIOR APEX (0.4 CM, GLEASON SCORE 4+4=8) OTHER MARGINS ARE NEGATIVE FOR TUMOR       Rectal cancer ([place])   10/02/2014 Procedure    Colonoscopy by Dr. Carlean Purl- One-third circumferential medium mass was found in the distal rectum; ulcerated, firm anterior over prostate bed, multiple biopsies were performed using cold forceps- suspect carcinoma ? rectal vs recurrence of prostate      10/04/2014 Pathology Results    Rectum, biopsy ulcerated mass-invasive adenocarcinoma consistent with colorectal origin.  CDX 2 positive.  PSA negative.      10/07/2014 Initial Diagnosis    Rectal cancer ([place])      10/08/2014 Imaging    CT CAP- 1. Wall thickening of the rectum compatible with recently diagnosed rectal carcinoma. Tiny subcentimeter perirectal lymph nodes. 2. Multiple bilateral pulmonary nodules as above. While these may be infectious or inflammatory in etiology, metastatic disease in the setting of known rectal carcinoma is not excluded. 3. Nodular soft tissue within the left mainstem bronchus which is nonspecific and may represent mucus however endobronchial nodule is not excluded. Recommend either attention on followup or correlation with bronchoscopy if the patient is having respiratory symptoms. 4. No  evidence for focal hepatic lesion. 5. Bilateral low-attenuation renal lesions with internal densities greater than that of fluid, indeterminate. Recommend attention on followup or definitive characterization with pre and post contrast-enhanced MRI.      10/10/2014 Procedure    EUS by Dr. Ardis Hughs- Non-circumferential, 3.4cm [phone] (Stage IIIB) rectal adenocarcinoma along the anterior wall of the distal rectum with distal edge 1-2cm from the anal verge.      12/11/2014 Procedure    Segmental resection for tumor by Dr. Leighton Ruff      26/83/4196 Pathology Results    Colon, segmental resection for tumor, Bladder and Prostate COLON: INVASIVE COLONIC ADENOCARCINOMA (3.5 CM IN GREATEST DIMENSION) THE CARCINOMA INVASIVE THROUGH THE MUSCULARIS PROPRIA AND PRESENTS AT THE ANTERIOR MESORECTUM RESECTION MARGIN (1.5 CM, R2) THE PROXIMAL AND DISTAL RESECTION MARGINS ARE NEGATIVE FOR TUMOR LYMPHOVASCULAR INVASION IS IDENTIFED ELEVEN BENIGN LYMPH NODES (0/11) PROSTATE: PROSTATIC ADENOCARCINOMA WITHOUT TREATMENT EFFECT, GLEASON SCORE 4+4=8 THE TUMOR LOCATED AT THE LEFT ANTERIOR APEX AND THE MID MARGIN OF RESECTION ARE POSITIVE AT THE LEFT ANTERIOR APEX (0.4 CM, GLEASON SCORE 4+4=8) OTHER MARGINS ARE NEGATIVE FOR TUMOR BLADDER: BENIGN UROTHELIUM AND SUBCUTANOUS TISSUE NEGATIVE FOR MALIGNANCY      06/20/2015 Imaging    CT CAP- 1. Interval cystoprostatectomy, urinary diversion, abdominal perineal resection and sigmoid colostomy. 2. No evidence of abdominal pelvic metastatic disease. 3. Stable small pulmonary nodules bilaterally. Based on stability over nearly 9 months, these are likely benign. Attention on continued follow-up recommended. 4. Stable low-density renal lesions bilaterally. 5. Moderate atherosclerosis.      12/31/2015 Procedure    Colonoscopy by Dr. Heron Sabins and colostomy in  the sigmoid colon.  Diverticulosis in the left colon, examination otherwise normal.       06/21/2016 Imaging    CT CAP- Stable exam. No definite evidence of recurrent or metastatic carcinoma.  Stable sub-cm noncalcified bilateral pulmonary nodules, most consistent with benign postinflammatory etiology. Recommend continued attention on follow-up imaging.  Cholelithiasis. No radiographic evidence of cholecystitis.  Aortic and coronary artery atherosclerosis.       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 ([place])      06/26/2017 -  Chemotherapy    The patient had palonosetron (ALOXI) injection 0.25 mg, 0.25 mg, Intravenous,  Once, 1 of 1 cycle Administration: 0.25 mg (06/27/2017), 0.25 mg (07/05/2017), 0.25 mg (07/12/2017), 0.25 mg (07/19/2017) CARBOplatin (PARAPLATIN) 150 mg in sodium chloride 0.9 % 100 mL chemo infusion, 150 mg (100 % of original dose 150 mg), Intravenous,  Once, 1 of 1 cycle Dose modification: 150 mg (original dose 150 mg, Cycle 1, Reason: Provider Judgment), 125 mg (original dose 125 mg, Cycle 1, Reason: Provider Judgment),   (original dose 150 mg, Cycle 1), 150 mg (original dose 150 mg, Cycle 1, Reason: Provider Judgment), 150 mg (original dose 150 mg, Cycle 1, Reason: Provider Judgment) Administration: 150 mg (06/27/2017), 130 mg (07/05/2017), 150 mg (07/12/2017), 150 mg (07/19/2017) PACLitaxel (TAXOL) 78 mg in sodium chloride 0.9 % 250 mL chemo infusion (</= 80mg /m2), 40 mg/m2 = 78 mg (80 % of original dose 50 mg/m2), Intravenous,  Once, 1 of 1 cycle Dose modification: 40 mg/m2 (80 % of original dose 50 mg/m2, Cycle 1, Reason: Provider Judgment), 40 mg/m2 (80 % of original dose 50 mg/m2, Cycle 1, Reason: Provider Judgment) Administration: 78 mg (06/27/2017), 78 mg (07/05/2017), 78 mg (07/12/2017), 78 mg (07/19/2017)  for chemotherapy treatment.         CANCER STAGING: Cancer Staging Rectal cancer [facility]) Staging form: Colon and Rectum, AJCC 8th  Edition - Pathologic stage from 05/31/2016: Stage IIA (pT3, pN0, cM0) - Signed by Baird Cancer, PA-C on 05/31/2016    INTERVAL HISTORY:  Ian Christensen 84 y.o. male returns for routine follow-up for esophageal cancer and 4th cycle of chemo today. Patient has been doing feeding through his PEG tube without complications. Patient does miss feeding somedays. He has had a 5 pound weight loss over the last week. Patient has been eating soft foods with Ensure and plans to increase his tube feedings. He has an appointment with the dietian today. Patient has had increase trouble swallowing with his radiation treatments and is more fatigued than normal. Patient states his constipation is better since he started the miralax daily. Overall, he tells me he has been feeling pretty well. Energy levels 75%; appetite 75%. He feels ready for his chem today.    REVIEW OF SYSTEMS:  Review of Systems  Constitutional: Positive for fatigue.  HENT:   Positive for trouble swallowing.   Gastrointestinal: Positive for constipation.  Neurological: Positive for extremity weakness.  Hematological: Bruises/bleeds easily.  All other systems reviewed and are negative.    PAST MEDICAL/SURGICAL HISTORY:  Past Medical History:  Diagnosis Date  . Arthritis   . Colostomy in place [facility])   . Esophageal cancer ([place])   . GERD (gastroesophageal reflux disease)   . History of colon polyps   . History of hiatal hernia   . History of prostate cancer 2001   s/p  radiactive seed prostate  implants/  11/ 2016  prostatectomy at time of rectal cancer resection (Gleason 4+4)  . HTN (hypertension)    followed by pcp  . Hyperlipidemia   . Presence of urostomy ([place])   . Rectal cancer ([place]) 10/07/2014   no chemo or radiation/ bladder was removed also  . Tinnitus    per pt intermittant   . Wears glasses    Past Surgical History:  Procedure Laterality Date  . APPENDECTOMY  1955  . COLONOSCOPY    . CYSTO N/A 12/11/2014    Procedure:  CYSTO,  PROSTATECTOMY, CYSTECTOMY colon conduit, BILATERAL LYMPH NODE DISSECTION;  Surgeon: Alexis Frock, MD;  Location: [facility];  Service: Urology;  Laterality: N/A;  . ESOPHAGOGASTRODUODENOSCOPY (EGD) WITH PROPOFOL N/A 07/05/2017   Procedure: ESOPHAGOGASTRODUODENOSCOPY (EGD) WITH PROPOFOL;  Surgeon: Aviva Signs, MD;  Location: [facility];  Service: General;  Laterality: N/A;  . EUS N/A 10/10/2014   Procedure: LOWER ENDOSCOPIC ULTRASOUND (EUS);  Surgeon: Milus Banister, MD;  Location: [facility];  Service: Endoscopy;  Laterality: N/A;  . HERNIA REPAIR    . INCISIONAL HERNIA REPAIR N/A 03/02/2017   Procedure: INCISIONAL HERNIA REPAIR WITH MESH;  Surgeon: Leighton Ruff, MD;  Location: [facility];  Service: General;  Laterality: N/A;  . INSERTION OF MESH  03/02/2017   Procedure: INSERTION OF MESH;  Surgeon: Leighton Ruff, MD;  Location: [facility];  Service: General;;  . LAPAROSCOPIC INTERNAL HERNIA REPAIR N/A 03/06/2017   Procedure: REOPENING OF RECENT LAPAROTOMY WITH CLOSURE OF INCISIONAL HERNIA WITH MESH;  Surgeon: Leighton Ruff, MD;  Location: [facility];  Service: General;  Laterality: N/A;  . PEG PLACEMENT N/A 07/05/2017   Procedure: PERCUTANEOUS ENDOSCOPIC GASTROSTOMY (PEG) PLACEMENT;  Surgeon: Aviva Signs, MD;  Location: [facility];  Service: General;  Laterality: N/A;  . PORTACATH PLACEMENT Left 07/05/2017   Procedure: INSERTION PORT WITH ATTACHED CATHETER IN LEFT SUBCLAVIAN;  Surgeon: Aviva Signs, MD;  Location: [facility];  Service: General;  Laterality: Left;  . RADIOACTIVE PROSTATE SEED IMPLANTS  02-06-1999  dr Jeffie Pollock  [facility]  . XI ROBOTIC ASSISTED LOWER ANTERIOR RESECTION N/A 12/11/2014   Procedure: XI ROBOTIC ASSISTED ABDOMINAL PERINEAL RESECTION OMENTOPLASTY;  Surgeon: Leighton Ruff, MD;  Location: [facility];  Service: General;  Laterality: N/A;     SOCIAL HISTORY:  Social History   Socioeconomic History  . Marital status: Widowed    Spouse name: Not on file  . Number of children: 2  .  Years of education: Not on file  . Highest education level: Not on file  Occupational History  . Occupation: retired  Scientific laboratory technician  . Financial resource strain: Not very hard  . Food insecurity:    Worry: Never true    Inability: Never true  . Transportation needs:    Medical: No    Non-medical: No  Tobacco Use  . Smoking status: Never Smoker  . Smokeless tobacco: Never Used  Substance and Sexual Activity  . Alcohol use: Yes    Alcohol/week: 0.0 oz    Comment: seldom  . Drug use: No  . Sexual activity: Not Currently    Comment: Widowed  Lifestyle  . Physical activity:    Days per week: 1 day    Minutes per session: 30 min  . Stress: Not at all  Relationships  . Social connections:    Talks on phone: Three times a week    Gets together: Three times a week    Attends religious service: More than 4 times per year  Active member of club or organization: Yes    Attends meetings of clubs or organizations: More than 4 times per year    Relationship status: Widowed  . Intimate partner violence:    Fear of current or ex partner: No    Emotionally abused: No    Physically abused: No    Forced sexual activity: No  Other Topics Concern  . Not on file  Social History Narrative  . Not on file    FAMILY HISTORY:  Family History  Problem Relation Age of Onset  . Diabetes Mother   . Heart disease Mother   . Heart disease Father   . Colon cancer Sister   . Prostate cancer Brother   . Esophageal cancer Neg Hx   . Rectal cancer Neg Hx   . Stomach cancer Neg Hx     CURRENT MEDICATIONS:  Outpatient Encounter Medications as of 07/19/2017  Medication Sig  . ACETAMINOPHEN PO Take 650 mg by mouth every 8 (eight) hours as needed (for pain).   Marland Kitchen aspirin EC 81 MG tablet Take 162 mg by mouth at bedtime. Takes 30 minutes prior to Niacin  . cyanocobalamin 500 MCG tablet Take 500 mcg by mouth daily. Vitamin B-12  . HYDROcodone-acetaminophen (HYCET) 7.5-325 mg/15 ml solution Take 10-15  mLs by mouth 4 (four) times daily as needed for moderate pain.  Marland Kitchen lidocaine-prilocaine (EMLA) cream Apply a quarter size amount to port site 1 hour prior to chemo. Do not rub in. Cover with plastic wrap.  . losartan (COZAAR) 25 MG tablet Take 25 mg by mouth daily.  . metoprolol succinate (TOPROL-XL) 25 MG 24 hr tablet Take 25 mg by mouth 2 (two) times daily.  . niacin (NIASPAN) 500 MG CR tablet Take 500 mg by mouth every evening.   Marland Kitchen omeprazole (PRILOSEC) 20 MG capsule Take 20 mg by mouth every evening.   . prochlorperazine (COMPAZINE) 10 MG tablet Take 1 tablet (10 mg total) by mouth every 6 (six) hours as needed for nausea or vomiting.  . simvastatin (ZOCOR) 20 MG tablet Take 20 mg by mouth every evening.   . sucralfate (CARAFATE) 1 g tablet Crush and mix in 1 oz of water q AC and HS.  . traMADol (ULTRAM) 50 MG tablet Take 1 tablet (50 mg total) by mouth 2 (two) times daily.  . [DISCONTINUED] traMADol (ULTRAM) 50 MG tablet Take 50 mg by mouth every 6 (six) hours as needed for moderate pain.   No facility-administered encounter medications on file as of 07/19/2017.     ALLERGIES:  Allergies  Allergen Reactions  . Morphine And Related     Caused hallucinations.  . Oxycodone     Caused hallucinations     PHYSICAL EXAM:  ECOG Performance status: 1  VITAL SIGNS: BP: 138/66, Pulse: 80, RESP: 18, TEMP: 97.4, 02sat: 97%   Physical Exam   LABORATORY DATA:  I have reviewed the labs as listed.  CBC    Component Value Date/Time   WBC 3.8 (L) 07/19/2017 0846   RBC 4.14 (L) 07/19/2017 0846   HGB 12.9 (L) 07/19/2017 0846   HCT 39.1 07/19/2017 0846   PLT 221 07/19/2017 0846   MCV 94.4 07/19/2017 0846   MCH 31.2 07/19/2017 0846   MCHC 33.0 07/19/2017 0846   RDW 14.9 07/19/2017 0846   LYMPHSABS 0.6 (L) 07/19/2017 0846   MONOABS 0.4 07/19/2017 0846   EOSABS 0.1 07/19/2017 0846   BASOSABS 0.0 07/19/2017 0846   CMP Latest Ref Rng &  Units 07/19/2017 07/12/2017 07/04/2017  Glucose 70 - 99  mg/dL 128(H) 141(H) 161(H)  BUN 8 - 23 mg/dL 26(H) 24(H) 23(H)  Creatinine 0.61 - 1.24 mg/dL 0.82 0.91 0.85  Sodium 135 - 145 mmol/L 138 138 138  Potassium 3.5 - 5.1 mmol/L 4.0 4.4 4.1  Chloride 98 - 111 mmol/L 104 102 104  CO2 22 - 32 mmol/L 24 28 27   Calcium 8.9 - 10.3 mg/dL 9.0 9.3 9.2  Total Protein 6.5 - 8.1 g/dL 6.6 6.9 6.6  Total Bilirubin 0.3 - 1.2 mg/dL 0.5 0.5 0.6  Alkaline Phos 38 - 126 U/L 66 67 74  AST 15 - 41 U/L 19 12(L) 18  ALT 0 - 44 U/L 20 11 25           ASSESSMENT & PLAN:   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 - Barium swallow done at [facility] showed a large mass in the distal esophagus. - PET/CT scan on 04/25/6806 hypermetabolic solid 3.5 x 2.3 cm esophageal mass in the mid to lower third of the thoracic esophagus with max SUV 7.3 and nonspecific mildly hypermetabolic nonenlarged bilateral hilar lymph nodes, indeterminate.  We discussed this with the patient in detail. - EGD on 05/26/2017 shows large fungating mass with no bleeding, 30 cm from incisors, extends to 35 cm, partially obstructing, biopsy consistent with squamous cell carcinoma. - Started on combination chemoradiation therapy with weekly carboplatin and paclitaxel on 06/27/2017.  He has developed worsening of difficulty swallowing.  He had a PEG tube placed on 07/05/2017 along with the Port-A-Cath on the same day.  He is taking and 1 can of Osmolite 1.5/day.  He is able to drink 3 ensures per day.  He is also eating soft foods. - He lost about 4 pounds of weight.  He is meeting dietitian later today.  He will proceed with week 4 of treatment today.  He will come back in 1 week for follow-up and next treatment.  2.  Colorectal cancer: - Underwent segmental colon resection in November 2016, found to have a T3N0 adenocarcinoma, with circumferential margins  positive.  Patient did not receive any neoadjuvant or adjuvant chemotherapy/radiation therapy.  3.  Prostate cancer: -Originally diagnosed in early 2000s, status post seed implants.  Underwent cystoprostatectomy in November 2016, pathology showed prostatic adenocarcinoma Gleason 8 (4+4).      Orders placed this encounter:  Orders Placed This Encounter  Procedures  . CBC with Differential/Platelet  . Comprehensive metabolic panel      Derek Jack, MD [place] [phone]

## 2017-07-19 NOTE — Progress Notes (Signed)
Tolerated infusions w/o adverse reaction.  Alert, in no distress.  VSS.  Discharged via wheelchair in c/o family.  

## 2017-07-19 NOTE — Assessment & Plan Note (Signed)
1.  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 - Barium swallow done at [facility] showed a large mass in the distal esophagus. - PET/CT scan on 02/03/5788 hypermetabolic solid 3.5 x 2.3 cm esophageal mass in the mid to lower third of the thoracic esophagus with max SUV 7.3 and nonspecific mildly hypermetabolic nonenlarged bilateral hilar lymph nodes, indeterminate.  We discussed this with the patient in detail. - EGD on 05/26/2017 shows large fungating mass with no bleeding, 30 cm from incisors, extends to 35 cm, partially obstructing, biopsy consistent with squamous cell carcinoma. - Started on combination chemoradiation therapy with weekly carboplatin and paclitaxel on 06/27/2017.  He has developed worsening of difficulty swallowing.  He had a PEG tube placed on 07/05/2017 along with the Port-A-Cath on the same day.  He is taking and 1 can of Osmolite 1.5/day.  He is able to drink 3 ensures per day.  He is also eating soft foods. - He lost about 4 pounds of weight.  He is meeting dietitian later today.  He will proceed with week 4 of treatment today.  He will come back in 1 week for follow-up and next treatment.  2.  Colorectal cancer: - Underwent segmental colon resection in November 2016, found to have a T3N0 adenocarcinoma, with circumferential margins positive.  Patient did not receive any neoadjuvant or adjuvant chemotherapy/radiation therapy.  3.  Prostate cancer: -Originally diagnosed in early 2000s, status post seed implants.  Underwent cystoprostatectomy in November 2016, pathology showed prostatic adenocarcinoma Gleason 8 (4+4).

## 2017-07-19 NOTE — Progress Notes (Signed)
[facility] CSW Progress Note  Check in visit w patient in infusion room.  Supportive needs explored.  Lives alone; however is getting help from insurance company for transportation and in home aide.  Will meet today w representative to set up schedule.  Has most difficulty w worry/anxiety at nights and on weekends.  Used to be active and able to engage in recreational activities - now w increasing fatigue and inability to get out, finds self less able to distract himself by activities he used to enjoy (playing cards and gold).  Now watches sports on TV.  Wife died after being in SNF 14 years.  Two daughters are supportive and involved; however, increasing isolation and lack of ability to get out is causing some distress. Discussed difficulties inherent in adjusting to illness and aging.  Edwyna Shell, LCSW Clinical Social Worker Phone:  [phone] .

## 2017-07-20 ENCOUNTER — Ambulatory Visit
Admission: RE | Admit: 2017-07-20 | Discharge: 2017-07-20 | Disposition: A | Discharge status: Home / Self Care | Payer: PPO | Source: Ambulatory Visit | Attending: Radiation Oncology | Admitting: Radiation Oncology

## 2017-07-20 ENCOUNTER — Ambulatory Visit: Payer: PPO

## 2017-07-20 DIAGNOSIS — C155 Malignant neoplasm of lower third of esophagus: Secondary | ICD-10-CM | POA: Diagnosis not present

## 2017-07-20 DIAGNOSIS — Z510 Encounter for antineoplastic radiation therapy: Secondary | ICD-10-CM | POA: Diagnosis not present

## 2017-07-22 ENCOUNTER — Ambulatory Visit: Payer: PPO

## 2017-07-22 ENCOUNTER — Ambulatory Visit
Admission: RE | Admit: 2017-07-22 | Discharge: 2017-07-22 | Disposition: A | Discharge status: Home / Self Care | Payer: PPO | Source: Ambulatory Visit | Attending: Radiation Oncology | Admitting: Radiation Oncology

## 2017-07-22 DIAGNOSIS — C155 Malignant neoplasm of lower third of esophagus: Secondary | ICD-10-CM | POA: Diagnosis not present

## 2017-07-22 DIAGNOSIS — Z510 Encounter for antineoplastic radiation therapy: Secondary | ICD-10-CM | POA: Diagnosis not present

## 2017-07-25 ENCOUNTER — Ambulatory Visit
Admission: RE | Admit: 2017-07-25 | Discharge: 2017-07-25 | Disposition: A | Discharge status: Home / Self Care | Payer: PPO | Source: Ambulatory Visit | Attending: Radiation Oncology | Admitting: Radiation Oncology

## 2017-07-25 ENCOUNTER — Ambulatory Visit: Payer: PPO

## 2017-07-25 DIAGNOSIS — Z510 Encounter for antineoplastic radiation therapy: Secondary | ICD-10-CM | POA: Diagnosis not present

## 2017-07-25 DIAGNOSIS — C155 Malignant neoplasm of lower third of esophagus: Secondary | ICD-10-CM | POA: Diagnosis not present

## 2017-07-26 ENCOUNTER — Inpatient Hospital Stay (HOSPITAL_COMMUNITY): Payer: PPO

## 2017-07-26 ENCOUNTER — Ambulatory Visit
Admission: RE | Admit: 2017-07-26 | Discharge: 2017-07-26 | Disposition: A | Discharge status: Home / Self Care | Payer: PPO | Source: Ambulatory Visit | Attending: Radiation Oncology | Admitting: Radiation Oncology

## 2017-07-26 ENCOUNTER — Ambulatory Visit: Payer: PPO

## 2017-07-26 VITALS — BP 130/66 | HR 86 | Temp 98.0°F | Resp 18 | Wt 175.4 lb

## 2017-07-26 DIAGNOSIS — Z510 Encounter for antineoplastic radiation therapy: Secondary | ICD-10-CM | POA: Diagnosis not present

## 2017-07-26 DIAGNOSIS — C155 Malignant neoplasm of lower third of esophagus: Secondary | ICD-10-CM

## 2017-07-26 DIAGNOSIS — Z5111 Encounter for antineoplastic chemotherapy: Secondary | ICD-10-CM | POA: Diagnosis not present

## 2017-07-26 LAB — COMPREHENSIVE METABOLIC PANEL
ALT: 20 U/L (ref 0–44)
AST: 19 U/L (ref 15–41)
Albumin: 3.6 g/dL (ref 3.5–5.0)
Alkaline Phosphatase: 58 U/L (ref 38–126)
Anion gap: 7 (ref 5–15)
BILIRUBIN TOTAL: 0.7 mg/dL (ref 0.3–1.2)
BUN: 28 mg/dL — AB (ref 8–23)
CALCIUM: 9 mg/dL (ref 8.9–10.3)
CO2: 28 mmol/L (ref 22–32)
CREATININE: 0.97 mg/dL (ref 0.61–1.24)
Chloride: 102 mmol/L (ref 98–111)
GFR calc Af Amer: >60 mL/min (ref >60)
Glucose, Bld: 130 mg/dL — ABNORMAL HIGH (ref 70–99)
Potassium: 4.3 mmol/L (ref 3.5–5.1)
Sodium: 137 mmol/L (ref 135–145)
TOTAL PROTEIN: 6.7 g/dL (ref 6.5–8.1)

## 2017-07-26 LAB — CBC WITH DIFFERENTIAL/PLATELET
BASOS ABS: 0 10*3/uL (ref 0.0–0.1)
Basophils Relative: 0 %
Eosinophils Absolute: 0.1 10*3/uL (ref 0.0–0.7)
Eosinophils Relative: 1 %
HEMATOCRIT: 39.3 % (ref 39.0–52.0)
Hemoglobin: 12.9 g/dL — ABNORMAL LOW (ref 13.0–17.0)
LYMPHS ABS: 0.6 10*3/uL — AB (ref 0.7–4.0)
LYMPHS PCT: 10 %
MCH: 31.3 pg (ref 26.0–34.0)
MCHC: 32.8 g/dL (ref 30.0–36.0)
MCV: 95.4 fL (ref 78.0–100.0)
MONO ABS: 0.5 10*3/uL (ref 0.1–1.0)
Monocytes Relative: 8 %
NEUTROS ABS: 5 10*3/uL (ref 1.7–7.7)
Neutrophils Relative %: 81 %
Platelets: 160 10*3/uL (ref 150–400)
RBC: 4.12 MIL/uL — AB (ref 4.22–5.81)
RDW: 15.4 % (ref 11.5–15.5)
WBC: 6.2 10*3/uL (ref 4.0–10.5)

## 2017-07-26 MED ORDER — FAMOTIDINE IN NACL 20-0.9 MG/50ML-% IV SOLN
20.0000 mg | Freq: Once | INTRAVENOUS | Status: AC
  Administered 2017-07-26: 20 mg via INTRAVENOUS
  Filled 2017-07-26: qty 50

## 2017-07-26 MED ORDER — PACLITAXEL CHEMO INJECTION 300 MG/50ML
40.0000 mg/m2 | Freq: Once | INTRAVENOUS | Status: AC
  Administered 2017-07-26: 78 mg via INTRAVENOUS
  Filled 2017-07-26: qty 13

## 2017-07-26 MED ORDER — SODIUM CHLORIDE 0.9 % IV SOLN
20.0000 mg | Freq: Once | INTRAVENOUS | Status: AC
  Administered 2017-07-26: 20 mg via INTRAVENOUS
  Filled 2017-07-26: qty 2

## 2017-07-26 MED ORDER — DIPHENHYDRAMINE HCL 50 MG/ML IJ SOLN
50.0000 mg | Freq: Once | INTRAMUSCULAR | Status: AC
  Administered 2017-07-26: 50 mg via INTRAVENOUS
  Filled 2017-07-26: qty 1

## 2017-07-26 MED ORDER — SODIUM CHLORIDE 0.9 % IV SOLN
150.0000 mg | Freq: Once | INTRAVENOUS | Status: AC
  Administered 2017-07-26: 150 mg via INTRAVENOUS
  Filled 2017-07-26: qty 15

## 2017-07-26 MED ORDER — PALONOSETRON HCL INJECTION 0.25 MG/5ML
0.2500 mg | Freq: Once | INTRAVENOUS | Status: AC
  Administered 2017-07-26: 0.25 mg via INTRAVENOUS
  Filled 2017-07-26: qty 5

## 2017-07-26 MED ORDER — SODIUM CHLORIDE 0.9% FLUSH
10.0000 mL | INTRAVENOUS | Status: DC | PRN
  Administered 2017-07-26: 10 mL
  Filled 2017-07-26: qty 10

## 2017-07-26 MED ORDER — SODIUM CHLORIDE 0.9 % IV SOLN
Freq: Once | INTRAVENOUS | Status: AC
  Administered 2017-07-26: 11:00:00 via INTRAVENOUS

## 2017-07-26 MED ORDER — HEPARIN SOD (PORK) LOCK FLUSH 100 UNIT/ML IV SOLN
500.0000 [IU] | Freq: Once | INTRAVENOUS | Status: AC | PRN
  Administered 2017-07-26: 500 [IU]

## 2017-07-26 NOTE — Patient Instructions (Signed)
[facility] Discharge Instructions for Patients Receiving Chemotherapy  Today you received the following chemotherapy agents taxol and carboplatin   If you develop nausea and vomiting that is not controlled by your nausea medication, call the clinic.   BELOW ARE SYMPTOMS THAT SHOULD BE REPORTED IMMEDIATELY:  *FEVER GREATER THAN 100.5 F  *CHILLS WITH OR WITHOUT FEVER  NAUSEA AND VOMITING THAT IS NOT CONTROLLED WITH YOUR NAUSEA MEDICATION  *UNUSUAL SHORTNESS OF BREATH  *UNUSUAL BRUISING OR BLEEDING  TENDERNESS IN MOUTH AND THROAT WITH OR WITHOUT PRESENCE OF ULCERS  *URINARY PROBLEMS  *BOWEL PROBLEMS  UNUSUAL RASH Items with * indicate a potential emergency and should be followed up as soon as possible.  Feel free to call the clinic should you have any questions or concerns. The clinic phone number is [phone].  Please show the CHEMO ALERT CARD at check-in to the Emergency Department and triage nurse.   

## 2017-07-26 NOTE — Progress Notes (Signed)
Patient tolerated chemotherapy with no complaints voiced.  Port site clean and dry with no bruising or swelling noted at site.  Good blood return noted before and after administration of chemotherapy.  No complaints of pain with flush.  Band aid applied.  VSS with discharge and left ambulatory with no s/s of distress noted.

## 2017-07-27 ENCOUNTER — Ambulatory Visit
Admission: RE | Admit: 2017-07-27 | Discharge: 2017-07-27 | Disposition: A | Discharge status: Home / Self Care | Payer: PPO | Source: Ambulatory Visit | Attending: Radiation Oncology | Admitting: Radiation Oncology

## 2017-07-27 ENCOUNTER — Inpatient Hospital Stay: Payer: PPO | Admitting: Nutrition

## 2017-07-27 ENCOUNTER — Ambulatory Visit: Payer: PPO

## 2017-07-27 DIAGNOSIS — C155 Malignant neoplasm of lower third of esophagus: Secondary | ICD-10-CM | POA: Diagnosis not present

## 2017-07-27 DIAGNOSIS — Z510 Encounter for antineoplastic radiation therapy: Secondary | ICD-10-CM | POA: Diagnosis not present

## 2017-07-27 NOTE — Progress Notes (Signed)
Nutrition follow-up completed with patient and caregiver after radiation therapy for squamous cell carcinoma of the distal esophagus. Patient's weight improved and documented as 177.4 pounds today increased from 176 pounds July 2. Patient reports he is enjoying pured foods. He continues to give 1 bottle Osmolite 1.5 via PEG daily. He states he is drinking 4 Ensure Enlive daily. He denies nutrition impact symptoms. He enjoys following his meal pattern that we worked out last week.  Nutrition diagnosis: Inadequate oral intake has improved.  Intervention: Patient will continue tube feedings and oral intake to meet greater than 90% estimated nutrition needs. Reinforced education on pureing foods with patient's caregiver. Questions were answered.  Teach back method used.  Monitoring, evaluation, goals: Patient will tolerate adequate calories and protein to minimize weight loss.  Next visit: Tuesday, July 16 after radiation therapy.  **Disclaimer: This note was dictated with voice recognition software. Similar sounding words can inadvertently be transcribed and this note may contain transcription errors which may not have been corrected upon publication of note.**

## 2017-07-28 ENCOUNTER — Ambulatory Visit
Admission: RE | Admit: 2017-07-28 | Discharge: 2017-07-28 | Disposition: A | Discharge status: Home / Self Care | Payer: PPO | Source: Ambulatory Visit | Attending: Radiation Oncology | Admitting: Radiation Oncology

## 2017-07-28 ENCOUNTER — Ambulatory Visit: Payer: PPO

## 2017-07-28 DIAGNOSIS — Z510 Encounter for antineoplastic radiation therapy: Secondary | ICD-10-CM | POA: Diagnosis not present

## 2017-07-28 DIAGNOSIS — C155 Malignant neoplasm of lower third of esophagus: Secondary | ICD-10-CM | POA: Diagnosis not present

## 2017-07-29 ENCOUNTER — Ambulatory Visit: Admission: RE | Admit: 2017-07-29 | Payer: PPO | Source: Ambulatory Visit

## 2017-07-29 ENCOUNTER — Ambulatory Visit: Payer: PPO

## 2017-08-01 ENCOUNTER — Ambulatory Visit: Payer: PPO

## 2017-08-01 ENCOUNTER — Ambulatory Visit
Admission: RE | Admit: 2017-08-01 | Discharge: 2017-08-01 | Disposition: A | Discharge status: Home / Self Care | Payer: PPO | Source: Ambulatory Visit | Attending: Radiation Oncology | Admitting: Radiation Oncology

## 2017-08-01 DIAGNOSIS — Z510 Encounter for antineoplastic radiation therapy: Secondary | ICD-10-CM | POA: Diagnosis not present

## 2017-08-01 DIAGNOSIS — C155 Malignant neoplasm of lower third of esophagus: Secondary | ICD-10-CM | POA: Diagnosis not present

## 2017-08-02 ENCOUNTER — Other Ambulatory Visit: Payer: Self-pay

## 2017-08-02 ENCOUNTER — Inpatient Hospital Stay: Payer: PPO | Admitting: Nutrition

## 2017-08-02 ENCOUNTER — Inpatient Hospital Stay (HOSPITAL_COMMUNITY): Payer: PPO

## 2017-08-02 ENCOUNTER — Encounter (HOSPITAL_COMMUNITY): Payer: Self-pay | Admitting: Hematology

## 2017-08-02 ENCOUNTER — Ambulatory Visit
Admission: RE | Admit: 2017-08-02 | Discharge: 2017-08-02 | Disposition: A | Discharge status: Home / Self Care | Payer: PPO | Source: Ambulatory Visit | Attending: Radiation Oncology | Admitting: Radiation Oncology

## 2017-08-02 ENCOUNTER — Ambulatory Visit: Payer: PPO

## 2017-08-02 ENCOUNTER — Inpatient Hospital Stay (HOSPITAL_BASED_OUTPATIENT_CLINIC_OR_DEPARTMENT_OTHER): Payer: PPO | Admitting: Hematology

## 2017-08-02 VITALS — BP 138/80 | HR 95 | Temp 98.6°F | Resp 18 | Wt 175.6 lb

## 2017-08-02 VITALS — BP 122/68 | HR 80 | Temp 97.9°F | Resp 18

## 2017-08-02 DIAGNOSIS — Z931 Gastrostomy status: Secondary | ICD-10-CM | POA: Diagnosis not present

## 2017-08-02 DIAGNOSIS — C155 Malignant neoplasm of lower third of esophagus: Secondary | ICD-10-CM

## 2017-08-02 DIAGNOSIS — Z510 Encounter for antineoplastic radiation therapy: Secondary | ICD-10-CM | POA: Diagnosis not present

## 2017-08-02 DIAGNOSIS — Z85048 Personal history of other malignant neoplasm of rectum, rectosigmoid junction, and anus: Secondary | ICD-10-CM | POA: Diagnosis not present

## 2017-08-02 DIAGNOSIS — C61 Malignant neoplasm of prostate: Secondary | ICD-10-CM

## 2017-08-02 DIAGNOSIS — K5900 Constipation, unspecified: Secondary | ICD-10-CM | POA: Diagnosis not present

## 2017-08-02 DIAGNOSIS — R070 Pain in throat: Secondary | ICD-10-CM | POA: Diagnosis not present

## 2017-08-02 DIAGNOSIS — Z8546 Personal history of malignant neoplasm of prostate: Secondary | ICD-10-CM

## 2017-08-02 DIAGNOSIS — C20 Malignant neoplasm of rectum: Secondary | ICD-10-CM

## 2017-08-02 DIAGNOSIS — Z5111 Encounter for antineoplastic chemotherapy: Secondary | ICD-10-CM | POA: Diagnosis not present

## 2017-08-02 LAB — CBC WITH DIFFERENTIAL/PLATELET
Basophils Absolute: 0 10*3/uL (ref 0.0–0.1)
Basophils Relative: 1 %
EOS ABS: 0.1 10*3/uL (ref 0.0–0.7)
EOS PCT: 2 %
HEMATOCRIT: 39.2 % (ref 39.0–52.0)
Hemoglobin: 13 g/dL (ref 13.0–17.0)
Lymphocytes Relative: 14 %
Lymphs Abs: 0.5 10*3/uL — ABNORMAL LOW (ref 0.7–4.0)
MCH: 31.6 pg (ref 26.0–34.0)
MCHC: 33.2 g/dL (ref 30.0–36.0)
MCV: 95.1 fL (ref 78.0–100.0)
MONO ABS: 0.4 10*3/uL (ref 0.1–1.0)
MONOS PCT: 12 %
NEUTROS ABS: 2.8 10*3/uL (ref 1.7–7.7)
Neutrophils Relative %: 71 %
Platelets: 143 10*3/uL — ABNORMAL LOW (ref 150–400)
RBC: 4.12 MIL/uL — ABNORMAL LOW (ref 4.22–5.81)
RDW: 16.3 % — AB (ref 11.5–15.5)
WBC: 3.8 10*3/uL — ABNORMAL LOW (ref 4.0–10.5)

## 2017-08-02 LAB — COMPREHENSIVE METABOLIC PANEL
ALBUMIN: 3.6 g/dL (ref 3.5–5.0)
ALT: 16 U/L (ref 0–44)
ANION GAP: 9 (ref 5–15)
AST: 18 U/L (ref 15–41)
Alkaline Phosphatase: 53 U/L (ref 38–126)
BILIRUBIN TOTAL: 0.6 mg/dL (ref 0.3–1.2)
BUN: 26 mg/dL — ABNORMAL HIGH (ref 8–23)
CO2: 24 mmol/L (ref 22–32)
Calcium: 9.1 mg/dL (ref 8.9–10.3)
Chloride: 106 mmol/L (ref 98–111)
Creatinine, Ser: 0.86 mg/dL (ref 0.61–1.24)
GFR calc Af Amer: >60 mL/min (ref >60)
GFR calc non Af Amer: >60 mL/min (ref >60)
GLUCOSE: 120 mg/dL — AB (ref 70–99)
POTASSIUM: 4 mmol/L (ref 3.5–5.1)
Sodium: 139 mmol/L (ref 135–145)
TOTAL PROTEIN: 6.6 g/dL (ref 6.5–8.1)

## 2017-08-02 MED ORDER — SODIUM CHLORIDE 0.9 % IV SOLN
Freq: Once | INTRAVENOUS | Status: AC
  Administered 2017-08-02: 10:00:00 via INTRAVENOUS

## 2017-08-02 MED ORDER — SODIUM CHLORIDE 0.9% FLUSH
10.0000 mL | INTRAVENOUS | Status: DC | PRN
  Administered 2017-08-02: 10 mL
  Filled 2017-08-02: qty 10

## 2017-08-02 MED ORDER — SODIUM CHLORIDE 0.9 % IV SOLN
20.0000 mg | Freq: Once | INTRAVENOUS | Status: AC
  Administered 2017-08-02: 20 mg via INTRAVENOUS
  Filled 2017-08-02: qty 2

## 2017-08-02 MED ORDER — FAMOTIDINE IN NACL 20-0.9 MG/50ML-% IV SOLN
20.0000 mg | Freq: Once | INTRAVENOUS | Status: AC
  Administered 2017-08-02: 20 mg via INTRAVENOUS
  Filled 2017-08-02: qty 50

## 2017-08-02 MED ORDER — HEPARIN SOD (PORK) LOCK FLUSH 100 UNIT/ML IV SOLN
500.0000 [IU] | Freq: Once | INTRAVENOUS | Status: AC | PRN
  Administered 2017-08-02: 500 [IU]
  Filled 2017-08-02: qty 5

## 2017-08-02 MED ORDER — SODIUM CHLORIDE 0.9 % IV SOLN
40.0000 mg/m2 | Freq: Once | INTRAVENOUS | Status: AC
  Administered 2017-08-02: 78 mg via INTRAVENOUS
  Filled 2017-08-02: qty 13

## 2017-08-02 MED ORDER — SODIUM CHLORIDE 0.9 % IV SOLN
150.0000 mg | Freq: Once | INTRAVENOUS | Status: AC
  Administered 2017-08-02: 150 mg via INTRAVENOUS
  Filled 2017-08-02: qty 15

## 2017-08-02 MED ORDER — DIPHENHYDRAMINE HCL 50 MG/ML IJ SOLN
50.0000 mg | Freq: Once | INTRAMUSCULAR | Status: AC
  Administered 2017-08-02: 50 mg via INTRAVENOUS
  Filled 2017-08-02: qty 1

## 2017-08-02 MED ORDER — PALONOSETRON HCL INJECTION 0.25 MG/5ML
0.2500 mg | Freq: Once | INTRAVENOUS | Status: AC
  Administered 2017-08-02: 0.25 mg via INTRAVENOUS
  Filled 2017-08-02: qty 5

## 2017-08-02 NOTE — Patient Instructions (Signed)
[facility] Discharge Instructions  Today you saw Dr. K.   Thank you for choosing [facility] at [facility] to provide your oncology and hematology care.  To afford each patient quality time with our provider, please arrive at least 15 minutes before your scheduled appointment time.   If you have a lab appointment with the Cancer Center please come in thru the  Main Entrance and check in at the main information desk  You need to re-schedule your appointment should you arrive 10 or more minutes late.  We strive to give you quality time with our providers, and arriving late affects you and other patients whose appointments are after yours.  Also, if you no show three or more times for appointments you may be dismissed from the clinic at the providers discretion.     Again, thank you for choosing [facility].  Our hope is that these requests will decrease the amount of time that you wait before being seen by our physicians.       _____________________________________________________________  Should you have questions after your visit to [facility], please contact our office at [phone] between the hours of 8:30 a.m. and 4:30 p.m.  Voicemails left after 4:30 p.m. will not be returned until the following business day.  For prescription refill requests, have your pharmacy contact our office.       Resources For Cancer Patients and their Caregivers ? American Cancer Society: Can assist with transportation, wigs, general needs, runs Look Good Feel Better.        1-888-227-6333 ? Cancer Care: Provides financial assistance, online support groups, medication/co-pay assistance.  1-800-813-HOPE (4673) ? [facility] Assists [place] cancer patients and their families through emotional , educational and financial support.  336-427-4357 ? [place] DSS Where to apply for food  stamps, Medicaid and utility assistance. 336-342-1394 ? [facility]: Transportation to medical appointments. 336-347-2287 ? Social Security Administration: May apply for disability if have a Stage IV cancer. 336-342-7796 1-800-772-1213 ? [place] Aging, Disability and Transit Services: Assists with nutrition, care and transit needs. 336-349-2343  Cancer Center Support Programs:   > Cancer Support Group  2nd Tuesday of the month 1pm-2pm, Journey Room   > Creative Journey  3rd Tuesday of the month 1130am-1pm, Journey Room    

## 2017-08-02 NOTE — Progress Notes (Signed)
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 Weight documented as 177 pounds this week decreased slightly from 177.4 pounds last week. Patient reports he continues to eat some pured foods. He continues 1 bottle Osmolite 1.5 via PEG daily along with 4 Ensure Enlive daily. Reports some constipation but has begun taking Senokot. Noted labs: BUN 26 glucose 120. Final radiation therapy to be completed July 19.  Nutrition diagnosis: Inadequate oral intake improved.  Intervention: Patient will continue tube feedings and oral intake to meet greater than 90% estimated nutrition needs. Provided additional information/suggestions on pured foods. Patient was given complementary Ensure Enlive. Questions answered.  Teach back method used.  Monitoring, evaluation, goals: Patient will tolerate adequate calories and protein to minimize weight loss.  Next visit: To be scheduled as needed.  **Disclaimer: This note was dictated with voice recognition software. Similar sounding words can inadvertently be transcribed and this note may contain transcription errors which may not have been corrected upon publication of note.**

## 2017-08-02 NOTE — Assessment & Plan Note (Signed)
1.  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 - Barium swallow done at [facility] showed a large mass in the distal esophagus. - PET/CT scan on 0/62/6948 hypermetabolic solid 3.5 x 2.3 cm esophageal mass in the mid to lower third of the thoracic esophagus with max SUV 7.3 and nonspecific mildly hypermetabolic nonenlarged bilateral hilar lymph nodes, indeterminate.  We discussed this with the patient in detail. - EGD on 05/26/2017 shows large fungating mass with no bleeding, 30 cm from incisors, extends to 35 cm, partially obstructing, biopsy consistent with squamous cell carcinoma. - Started on combination chemoradiation therapy with weekly carboplatin and paclitaxel on 06/27/2017.  He has developed worsening of difficulty swallowing.  He had a PEG tube placed on 07/05/2017 along with the Port-A-Cath on the same day.  He is taking and 1 can of Osmolite 1.5/day.  He is able to drink 3 ensures per day.  He is also eating soft foods. - His weight is more or less stable.  He is tolerating chemotherapy very well.  He will proceed with his last treatment today.  He will finish radiation therapy on 08/05/2017.  I plan to check his labs next week.  I will see him back in 1 month for follow-up.  2.  Colorectal cancer: - Underwent segmental colon resection in November 2016, found to have a T3N0 adenocarcinoma, with circumferential margins positive.  Patient did not receive any neoadjuvant or adjuvant chemotherapy/radiation therapy.  3.  Prostate cancer: -Originally diagnosed in early 2000s, status post seed implants.  Underwent cystoprostatectomy in November 2016, pathology showed prostatic adenocarcinoma Gleason 8 (4+4).

## 2017-08-02 NOTE — Progress Notes (Signed)
Patient tolerated chemotherapy with no complaints voiced.  Port site clean and dry with no bruising or swelling noted at site.  Good blood return noted before and after administration of chemo.  Band aid applied.  VSs with discharge and left ambulatory with no s/s of distress noted.

## 2017-08-02 NOTE — Patient Instructions (Signed)
[facility] Discharge Instructions for Patients Receiving Chemotherapy  Today you received the following chemotherapy agents carboplatin and taxol.    If you develop nausea and vomiting that is not controlled by your nausea medication, call the clinic.   BELOW ARE SYMPTOMS THAT SHOULD BE REPORTED IMMEDIATELY:  *FEVER GREATER THAN 100.5 F  *CHILLS WITH OR WITHOUT FEVER  NAUSEA AND VOMITING THAT IS NOT CONTROLLED WITH YOUR NAUSEA MEDICATION  *UNUSUAL SHORTNESS OF BREATH  *UNUSUAL BRUISING OR BLEEDING  TENDERNESS IN MOUTH AND THROAT WITH OR WITHOUT PRESENCE OF ULCERS  *URINARY PROBLEMS  *BOWEL PROBLEMS  UNUSUAL RASH Items with * indicate a potential emergency and should be followed up as soon as possible.  Feel free to call the clinic should you have any questions or concerns. The clinic phone number is [phone].  Please show the CHEMO ALERT CARD at check-in to the Emergency Department and triage nurse.   

## 2017-08-02 NOTE — Progress Notes (Signed)
[address], [place] [id number]   CLINIC:  Medical Oncology/Hematology  PCP:  [facility], [place] [address] [phone]   REASON FOR VISIT:  Follow-up for esophogeal squamous cell carcinoma  CURRENT THERAPY: chemoradiation therapy with carboplatin and paclitaxel  BRIEF ONCOLOGIC HISTORY:    Prostate cancer ([place])   12/11/2014 Procedure    Prostatectomy at time of rectal ca resection      12/17/2014 Pathology Results    PROSTATE: PROSTATIC ADENOCARCINOMA WITHOUT TREATMENT EFFECT, GLEASON SCORE 4+4=8 THE TUMOR LOCATED AT THE LEFT ANTERIOR APEX AND THE MID MARGIN OF RESECTION ARE POSITIVE AT THE LEFT ANTERIOR APEX (0.4 CM, GLEASON SCORE 4+4=8) OTHER MARGINS ARE NEGATIVE FOR TUMOR       Rectal cancer ([place])   10/02/2014 Procedure    Colonoscopy by Dr. Carlean Purl- One-third circumferential medium mass was found in the distal rectum; ulcerated, firm anterior over prostate bed, multiple biopsies were performed using cold forceps- suspect carcinoma ? rectal vs recurrence of prostate      10/04/2014 Pathology Results    Rectum, biopsy ulcerated mass-invasive adenocarcinoma consistent with colorectal origin.  CDX 2 positive.  PSA negative.      10/07/2014 Initial Diagnosis    Rectal cancer ([place])      10/08/2014 Imaging    CT CAP- 1. Wall thickening of the rectum compatible with recently diagnosed rectal carcinoma. Tiny subcentimeter perirectal lymph nodes. 2. Multiple bilateral pulmonary nodules as above. While these may be infectious or inflammatory in etiology, metastatic disease in the setting of known rectal carcinoma is not excluded. 3. Nodular soft tissue within the left mainstem bronchus which is nonspecific and may represent mucus however endobronchial nodule is not excluded. Recommend either attention on followup or correlation with bronchoscopy if the patient is having respiratory symptoms. 4. No  evidence for focal hepatic lesion. 5. Bilateral low-attenuation renal lesions with internal densities greater than that of fluid, indeterminate. Recommend attention on followup or definitive characterization with pre and post contrast-enhanced MRI.      10/10/2014 Procedure    EUS by Dr. Ardis Hughs- Non-circumferential, 3.4cm [phone] (Stage IIIB) rectal adenocarcinoma along the anterior wall of the distal rectum with distal edge 1-2cm from the anal verge.      12/11/2014 Procedure    Segmental resection for tumor by Dr. Leighton Ruff      93/79/0240 Pathology Results    Colon, segmental resection for tumor, Bladder and Prostate COLON: INVASIVE COLONIC ADENOCARCINOMA (3.5 CM IN GREATEST DIMENSION) THE CARCINOMA INVASIVE THROUGH THE MUSCULARIS PROPRIA AND PRESENTS AT THE ANTERIOR MESORECTUM RESECTION MARGIN (1.5 CM, R2) THE PROXIMAL AND DISTAL RESECTION MARGINS ARE NEGATIVE FOR TUMOR LYMPHOVASCULAR INVASION IS IDENTIFED ELEVEN BENIGN LYMPH NODES (0/11) PROSTATE: PROSTATIC ADENOCARCINOMA WITHOUT TREATMENT EFFECT, GLEASON SCORE 4+4=8 THE TUMOR LOCATED AT THE LEFT ANTERIOR APEX AND THE MID MARGIN OF RESECTION ARE POSITIVE AT THE LEFT ANTERIOR APEX (0.4 CM, GLEASON SCORE 4+4=8) OTHER MARGINS ARE NEGATIVE FOR TUMOR BLADDER: BENIGN UROTHELIUM AND SUBCUTANOUS TISSUE NEGATIVE FOR MALIGNANCY      06/20/2015 Imaging    CT CAP- 1. Interval cystoprostatectomy, urinary diversion, abdominal perineal resection and sigmoid colostomy. 2. No evidence of abdominal pelvic metastatic disease. 3. Stable small pulmonary nodules bilaterally. Based on stability over nearly 9 months, these are likely benign. Attention on continued follow-up recommended. 4. Stable low-density renal lesions bilaterally. 5. Moderate atherosclerosis.      12/31/2015 Procedure    Colonoscopy by Dr. Heron Sabins and colostomy in the  sigmoid colon.  Diverticulosis in the left colon, examination otherwise normal.       06/21/2016 Imaging    CT CAP- Stable exam. No definite evidence of recurrent or metastatic carcinoma.  Stable sub-cm noncalcified bilateral pulmonary nodules, most consistent with benign postinflammatory etiology. Recommend continued attention on follow-up imaging.  Cholelithiasis. No radiographic evidence of cholecystitis.  Aortic and coronary artery atherosclerosis.       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)      06/26/2017 -  Chemotherapy    The patient had palonosetron (ALOXI) injection 0.25 mg, 0.25 mg, Intravenous,  Once, 1 of 1 cycle Administration: 0.25 mg (06/27/2017), 0.25 mg (07/05/2017), 0.25 mg (07/12/2017), 0.25 mg (07/19/2017), 0.25 mg (07/26/2017) CARBOplatin (PARAPLATIN) 150 mg in sodium chloride 0.9 % 100 mL chemo infusion, 150 mg (100 % of original dose 150 mg), Intravenous,  Once, 1 of 1 cycle Dose modification: 150 mg (original dose 150 mg, Cycle 1, Reason: Provider Judgment), 125 mg (original dose 125 mg, Cycle 1, Reason: Provider Judgment),   (original dose 150 mg, Cycle 1), 150 mg (original dose 150 mg, Cycle 1, Reason: Provider Judgment), 150 mg (original dose 150 mg, Cycle 1, Reason: Provider Judgment), 150 mg (original dose 150 mg, Cycle 1, Reason: Provider Judgment), 150 mg (original dose 150 mg, Cycle 1, Reason: Provider Judgment) Administration: 150 mg (06/27/2017), 130 mg (07/05/2017), 150 mg (07/12/2017), 150 mg (07/19/2017), 150 mg (07/26/2017) PACLitaxel (TAXOL) 78 mg in sodium chloride 0.9 % 250 mL chemo infusion (</= 60m/m2), 40 mg/m2 = 78 mg (80 % of original dose 50 mg/m2), Intravenous,  Once, 1 of 1 cycle Dose modification: 40 mg/m2 (80 % of original dose 50 mg/m2, Cycle 1, Reason: Provider Judgment), 40 mg/m2 (80 % of original dose 50 mg/m2, Cycle 1, Reason: Provider Judgment) Administration: 78 mg (06/27/2017), 78 mg (07/05/2017), 78 mg  (07/12/2017), 78 mg (07/19/2017), 78 mg (07/26/2017)  for chemotherapy treatment.         CANCER STAGING: Cancer Staging Rectal cancer ([facility] Staging form: Colon and Rectum, AJCC 8th Edition - Pathologic stage from 05/31/2016: Stage IIA (pT3, pN0, cM0) - Signed by KBaird Cancer PA-C on 05/31/2016    INTERVAL HISTORY:  Mr. [email or web address].[email or web address]. male returns for routine follow-up for his esophageal squamous cell carcinoma and consideration for last and 6th cycle of chemotherapy. Patient is here today with his daughter. He is doing well. He is able to swallow soft food like mac and cheese and pudding. He is using his feeding tube daily getting at least one can of ensure a day. He still has issues with constipation occasionally. He still has pain in his throat says it is only a 2/10. He has one more radiation treatment on Friday of this week. His energy level and appetite stays around 50%. He denies any recent fevers or infections. Denies any new pains. Denies any nausea, vomiting, or diarrhea.      REVIEW OF SYSTEMS:  Review of Systems  Constitutional: Positive for fatigue.  HENT:   Positive for sore throat.   Eyes: Negative.   Respiratory: Negative.   Cardiovascular: Negative.   Gastrointestinal: Positive for constipation.  Endocrine: Negative.   Genitourinary: Negative.    Musculoskeletal: Negative.   Skin: Negative.   Neurological: Negative.   Hematological: Negative.   Psychiatric/Behavioral: Negative.      PAST MEDICAL/SURGICAL HISTORY:  Past  Medical History:  Diagnosis Date  . Arthritis   . Colostomy in place [facility])   . Esophageal cancer ([place])   . GERD (gastroesophageal reflux disease)   . History of colon polyps   . History of hiatal hernia   . 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 HTN (hypertension)    followed by pcp  . Hyperlipidemia   . Presence of  urostomy ([place])   . Rectal cancer ([place]) 10/07/2014   no chemo or radiation/ bladder was removed also  . Tinnitus    per pt intermittant   . Wears glasses    Past Surgical History:  Procedure Laterality Date  . APPENDECTOMY  1955  . COLONOSCOPY    . CYSTO N/A 12/11/2014   Procedure:  CYSTO,  PROSTATECTOMY, CYSTECTOMY colon conduit, BILATERAL LYMPH NODE DISSECTION;  Surgeon: Alexis Frock, MD;  Location: [facility];  Service: Urology;  Laterality: N/A;  . ESOPHAGOGASTRODUODENOSCOPY (EGD) WITH PROPOFOL N/A 07/05/2017   Procedure: ESOPHAGOGASTRODUODENOSCOPY (EGD) WITH PROPOFOL;  Surgeon: Aviva Signs, MD;  Location: [facility];  Service: General;  Laterality: N/A;  . EUS N/A 10/10/2014   Procedure: LOWER ENDOSCOPIC ULTRASOUND (EUS);  Surgeon: Milus Banister, MD;  Location: [facility];  Service: Endoscopy;  Laterality: N/A;  . HERNIA REPAIR    . INCISIONAL HERNIA REPAIR N/A 03/02/2017   Procedure: INCISIONAL HERNIA REPAIR WITH MESH;  Surgeon: Leighton Ruff, MD;  Location: [facility];  Service: General;  Laterality: N/A;  . INSERTION OF MESH  03/02/2017   Procedure: INSERTION OF MESH;  Surgeon: Leighton Ruff, MD;  Location: [facility];  Service: General;;  . LAPAROSCOPIC INTERNAL HERNIA REPAIR N/A 03/06/2017   Procedure: REOPENING OF RECENT LAPAROTOMY WITH CLOSURE OF INCISIONAL HERNIA WITH MESH;  Surgeon: Leighton Ruff, MD;  Location: [facility];  Service: General;  Laterality: N/A;  . PEG PLACEMENT N/A 07/05/2017   Procedure: PERCUTANEOUS ENDOSCOPIC GASTROSTOMY (PEG) PLACEMENT;  Surgeon: Aviva Signs, MD;  Location: [facility];  Service: General;  Laterality: N/A;  . PORTACATH PLACEMENT Left 07/05/2017   Procedure: INSERTION PORT WITH ATTACHED CATHETER IN LEFT SUBCLAVIAN;  Surgeon: Aviva Signs, MD;  Location: [facility];  Service: General;  Laterality: Left;  . RADIOACTIVE PROSTATE SEED IMPLANTS  02-06-1999  dr Jeffie Pollock  [facility]  . XI ROBOTIC ASSISTED LOWER ANTERIOR RESECTION N/A 12/11/2014   Procedure: XI ROBOTIC ASSISTED  ABDOMINAL PERINEAL RESECTION OMENTOPLASTY;  Surgeon: Leighton Ruff, MD;  Location: [facility];  Service: General;  Laterality: N/A;     SOCIAL HISTORY:  Social History   Socioeconomic History  . Marital status: Widowed    Spouse name: Not on file  . Number of children: 2  . Years of education: Not on file  . Highest education level: Not on file  Occupational History  . Occupation: retired  Scientific laboratory technician  . Financial resource strain: Not very hard  . Food insecurity:    Worry: Never true    Inability: Never true  . Transportation needs:    Medical: No    Non-medical: No  Tobacco Use  . Smoking status: Never Smoker  . Smokeless tobacco: Never Used  Substance and Sexual Activity  . Alcohol use: Yes    Alcohol/week: 0.0 oz    Comment: seldom  . Drug use: No  . Sexual activity: Not Currently    Comment: Widowed  Lifestyle  . Physical activity:  Days per week: 1 day    Minutes per session: 30 min  . Stress: Not at all  Relationships  . Social connections:    Talks on phone: Three times a week    Gets together: Three times a week    Attends religious service: More than 4 times per year    Active member of club or organization: Yes    Attends meetings of clubs or organizations: More than 4 times per year    Relationship status: Widowed  . Intimate partner violence:    Fear of current or ex partner: No    Emotionally abused: No    Physically abused: No    Forced sexual activity: No  Other Topics Concern  . Not on file  Social History Narrative  . Not on file    FAMILY HISTORY:  Family History  Problem Relation Age of Onset  . Diabetes Mother   . Heart disease Mother   . Heart disease Father   . Colon cancer Sister   . Prostate cancer Brother   . Esophageal cancer Neg Hx   . Rectal cancer Neg Hx   . Stomach cancer Neg Hx     CURRENT MEDICATIONS:  Outpatient Encounter Medications as of 08/02/2017  Medication Sig  . ACETAMINOPHEN PO Take 650 mg by mouth every  8 (eight) hours as needed (for pain).   Marland Kitchen aspirin EC 81 MG tablet Take 162 mg by mouth at bedtime. Takes 30 minutes prior to Niacin  . cyanocobalamin 500 MCG tablet Take 500 mcg by mouth daily. Vitamin B-12  . lidocaine-prilocaine (EMLA) cream Apply a quarter size amount to port site 1 hour prior to chemo. Do not rub in. Cover with plastic wrap.  . losartan (COZAAR) 25 MG tablet Take 25 mg by mouth daily.  . metoprolol succinate (TOPROL-XL) 25 MG 24 hr tablet Take 25 mg by mouth 2 (two) times daily.  . niacin (NIASPAN) 500 MG CR tablet Take 500 mg by mouth every evening.   Marland Kitchen omeprazole (PRILOSEC) 20 MG capsule Take 20 mg by mouth every evening.   . prochlorperazine (COMPAZINE) 10 MG tablet Take 1 tablet (10 mg total) by mouth every 6 (six) hours as needed for nausea or vomiting.  . simvastatin (ZOCOR) 20 MG tablet Take 20 mg by mouth every evening.   . sucralfate (CARAFATE) 1 g tablet Crush and mix in 1 oz of water q AC and HS.  . traMADol (ULTRAM) 50 MG tablet Take 1 tablet (50 mg total) by mouth 2 (two) times daily.  Marland Kitchen HYDROcodone-acetaminophen (HYCET) 7.5-325 mg/15 ml solution Take 10-15 mLs by mouth 4 (four) times daily as needed for moderate pain. (Patient not taking: Reported on 08/02/2017)   No facility-administered encounter medications on file as of 08/02/2017.     ALLERGIES:  Allergies  Allergen Reactions  . Morphine And Related     Caused hallucinations.  . Oxycodone     Caused hallucinations     PHYSICAL EXAM:  ECOG Performance status: 1  Vitals:   08/02/17 0900  BP: 138/80  Pulse: 95  Resp: 18  Temp: 98.6 F (37 C)  SpO2: 97%   Filed Weights   08/02/17 0900  Weight: 175 lb 9.6 oz (79.7 kg)    Physical Exam  Constitutional: He is oriented to person, place, and time.  Cardiovascular: Normal rate, regular rhythm and normal heart sounds.  Pulmonary/Chest: Effort normal and breath sounds normal.  Neurological: He is alert and oriented to person,  place, and time.    Skin: Skin is warm and dry.     LABORATORY DATA:  I have reviewed the labs as listed.  CBC    Component Value Date/Time   WBC 3.8 (L) 08/02/2017 0806   RBC 4.12 (L) 08/02/2017 0806   HGB 13.0 08/02/2017 0806   HCT 39.2 08/02/2017 0806   PLT 143 (L) 08/02/2017 0806   MCV 95.1 08/02/2017 0806   MCH 31.6 08/02/2017 0806   MCHC 33.2 08/02/2017 0806   RDW 16.3 (H) 08/02/2017 0806   LYMPHSABS 0.5 (L) 08/02/2017 0806   MONOABS 0.4 08/02/2017 0806   EOSABS 0.1 08/02/2017 0806   BASOSABS 0.0 08/02/2017 0806   CMP Latest Ref Rng & Units 08/02/2017 07/26/2017 07/19/2017  Glucose 70 - 99 mg/dL 120(H) 130(H) 128(H)  BUN 8 - 23 mg/dL 26(H) 28(H) 26(H)  Creatinine 0.61 - 1.24 mg/dL 0.86 0.97 0.82  Sodium 135 - 145 mmol/L 139 137 138  Potassium 3.5 - 5.1 mmol/L 4.0 4.3 4.0  Chloride 98 - 111 mmol/L 106 102 104  CO2 22 - 32 mmol/L 24 28 24   Calcium 8.9 - 10.3 mg/dL 9.1 9.0 9.0  Total Protein 6.5 - 8.1 g/dL 6.6 6.7 6.6  Total Bilirubin 0.3 - 1.2 mg/dL 0.6 0.7 0.5  Alkaline Phos 38 - 126 U/L 53 58 66  AST 15 - 41 U/L 18 19 19   ALT 0 - 44 U/L 16 20 20         ASSESSMENT & PLAN:   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 - Barium swallow done at [facility] showed a large mass in the distal esophagus. - PET/CT scan on 08/27/9831 hypermetabolic solid 3.5 x 2.3 cm esophageal mass in the mid to lower third of the thoracic esophagus with max SUV 7.3 and nonspecific mildly hypermetabolic nonenlarged bilateral hilar lymph nodes, indeterminate.  We discussed this with the patient in detail. - EGD on 05/26/2017 shows large fungating mass with no bleeding, 30 cm from incisors, extends to 35 cm, partially obstructing, biopsy consistent with squamous cell carcinoma. - Started on combination chemoradiation therapy with weekly carboplatin and paclitaxel on  06/27/2017.  He has developed worsening of difficulty swallowing.  He had a PEG tube placed on 07/05/2017 along with the Port-A-Cath on the same day.  He is taking and 1 can of Osmolite 1.5/day.  He is able to drink 3 ensures per day.  He is also eating soft foods. - His weight is more or less stable.  He is tolerating chemotherapy very well.  He will proceed with his last treatment today.  He will finish radiation therapy on 08/05/2017.  I plan to check his labs next week.  I will see him back in 1 month for follow-up.  2.  Colorectal cancer: - Underwent segmental colon resection in November 2016, found to have a T3N0 adenocarcinoma, with circumferential margins positive.  Patient did not receive any neoadjuvant or adjuvant chemotherapy/radiation therapy.  3.  Prostate cancer: -Originally diagnosed in early 2000s, status post seed implants.  Underwent cystoprostatectomy in November 2016, pathology showed prostatic adenocarcinoma Gleason 8 (4+4).      Orders placed this encounter:  Orders Placed This Encounter  Procedures  . CBC with Differential/Platelet  . Comprehensive metabolic panel  . CBC with Differential/Platelet  . Comprehensive metabolic panel      Derek Jack, MD  [place] [phone]

## 2017-08-03 ENCOUNTER — Ambulatory Visit
Admission: RE | Admit: 2017-08-03 | Discharge: 2017-08-03 | Disposition: A | Discharge status: Home / Self Care | Payer: PPO | Source: Ambulatory Visit | Attending: Radiation Oncology | Admitting: Radiation Oncology

## 2017-08-03 ENCOUNTER — Ambulatory Visit: Payer: PPO

## 2017-08-03 DIAGNOSIS — Z510 Encounter for antineoplastic radiation therapy: Secondary | ICD-10-CM | POA: Diagnosis not present

## 2017-08-03 DIAGNOSIS — C155 Malignant neoplasm of lower third of esophagus: Secondary | ICD-10-CM | POA: Diagnosis not present

## 2017-08-04 ENCOUNTER — Ambulatory Visit
Admission: RE | Admit: 2017-08-04 | Discharge: 2017-08-04 | Disposition: A | Discharge status: Home / Self Care | Payer: PPO | Source: Ambulatory Visit | Attending: Radiation Oncology | Admitting: Radiation Oncology

## 2017-08-04 ENCOUNTER — Ambulatory Visit: Payer: PPO

## 2017-08-04 ENCOUNTER — Other Ambulatory Visit (HOSPITAL_COMMUNITY): Payer: PPO

## 2017-08-04 DIAGNOSIS — C155 Malignant neoplasm of lower third of esophagus: Secondary | ICD-10-CM | POA: Diagnosis not present

## 2017-08-04 DIAGNOSIS — Z510 Encounter for antineoplastic radiation therapy: Secondary | ICD-10-CM | POA: Diagnosis not present

## 2017-08-05 ENCOUNTER — Ambulatory Visit
Admission: RE | Admit: 2017-08-05 | Discharge: 2017-08-05 | Disposition: A | Discharge status: Home / Self Care | Payer: PPO | Source: Ambulatory Visit | Attending: Radiation Oncology | Admitting: Radiation Oncology

## 2017-08-05 ENCOUNTER — Encounter: Payer: Self-pay | Admitting: Radiation Oncology

## 2017-08-05 DIAGNOSIS — Z510 Encounter for antineoplastic radiation therapy: Secondary | ICD-10-CM | POA: Diagnosis not present

## 2017-08-05 DIAGNOSIS — C155 Malignant neoplasm of lower third of esophagus: Secondary | ICD-10-CM | POA: Diagnosis not present

## 2017-08-09 ENCOUNTER — Ambulatory Visit (HOSPITAL_COMMUNITY)
Admission: RE | Admit: 2017-08-09 | Discharge: 2017-08-09 | Disposition: A | Discharge status: Home / Self Care | Payer: PPO | Source: Ambulatory Visit | Attending: Internal Medicine | Admitting: Internal Medicine

## 2017-08-10 NOTE — Progress Notes (Signed)
Radiation Oncology         [phone]) [phone] ________________________________  Name: Ian Christensen MRN: [id number]  Date: 08/05/2017  DOB: 03/06/1931  End of Treatment Note  Diagnosis:   84 y.o. male with Stage IIA, cT2N0M0 squamous cell carcinoma of the distal third of the esophagus    Indication for treatment:  Curative       Radiation treatment dates:   06/27/2017 - 08/05/2017  Site/dose:   The esophagus received 50 Gy in 25 fractions initially, also using a simultaneous boost technique to 45 Gy. A boost was then given to 6 Gy/5.4 Gy using a SIB technique to yield 56 Gy to the high dose target and 50.4 Gy to the lower dose target.  Beams/energy:   IMRT / 6X Photon  Narrative: The patient tolerated radiation treatment relatively well with concurrent chemotherapy.  He experienced mild fatigue and some esophagitis with a sore throat that was managed with Hycet and Carafate. He did have a PEG tube in place and was able to maintain a relatively stable weight throughout the course of treatment.  Plan: The patient has completed radiation treatment. The patient will return to radiation oncology clinic for routine followup in one month. I advised them to call or return sooner if they have any questions or concerns related to their recovery or treatment.  ------------------------------------------------  Radene Gunning, MD, PhD  This document serves as a record of services personally performed by Dorothy Puffer, MD. It was created on his behalf by Ivar Bury, a trained medical scribe. The creation of this record is based on the scribe's personal observations and the provider's statements to them. This document has been checked and approved by the attending provider.

## 2017-08-12 ENCOUNTER — Ambulatory Visit (HOSPITAL_COMMUNITY): Payer: PPO | Admitting: Hematology

## 2017-08-12 ENCOUNTER — Encounter (HOSPITAL_COMMUNITY): Payer: Self-pay

## 2017-08-12 ENCOUNTER — Inpatient Hospital Stay (HOSPITAL_COMMUNITY): Payer: PPO

## 2017-08-12 ENCOUNTER — Telehealth (HOSPITAL_COMMUNITY): Payer: Self-pay

## 2017-08-12 DIAGNOSIS — C155 Malignant neoplasm of lower third of esophagus: Secondary | ICD-10-CM

## 2017-08-12 DIAGNOSIS — Z5111 Encounter for antineoplastic chemotherapy: Secondary | ICD-10-CM | POA: Diagnosis not present

## 2017-08-12 LAB — COMPREHENSIVE METABOLIC PANEL
ALK PHOS: 88 U/L (ref 38–126)
ALT: 28 U/L (ref 0–44)
AST: 24 U/L (ref 15–41)
Albumin: 3.6 g/dL (ref 3.5–5.0)
Anion gap: 8 (ref 5–15)
BILIRUBIN TOTAL: 0.7 mg/dL (ref 0.3–1.2)
BUN: 22 mg/dL (ref 8–23)
CALCIUM: 9.2 mg/dL (ref 8.9–10.3)
CO2: 25 mmol/L (ref 22–32)
CREATININE: 0.88 mg/dL (ref 0.61–1.24)
Chloride: 104 mmol/L (ref 98–111)
GFR calc non Af Amer: >60 mL/min (ref >60)
Glucose, Bld: 124 mg/dL — ABNORMAL HIGH (ref 70–99)
Potassium: 3.9 mmol/L (ref 3.5–5.1)
SODIUM: 137 mmol/L (ref 135–145)
Total Protein: 6.8 g/dL (ref 6.5–8.1)

## 2017-08-12 LAB — CBC WITH DIFFERENTIAL/PLATELET
Basophils Absolute: 0 10*3/uL (ref 0.0–0.1)
Basophils Relative: 1 %
EOS ABS: 0 10*3/uL (ref 0.0–0.7)
Eosinophils Relative: 1 %
HEMATOCRIT: 37.3 % — AB (ref 39.0–52.0)
HEMOGLOBIN: 12.4 g/dL — AB (ref 13.0–17.0)
LYMPHS ABS: 0.6 10*3/uL — AB (ref 0.7–4.0)
Lymphocytes Relative: 17 %
MCH: 31.9 pg (ref 26.0–34.0)
MCHC: 33.2 g/dL (ref 30.0–36.0)
MCV: 95.9 fL (ref 78.0–100.0)
MONOS PCT: 17 %
Monocytes Absolute: 0.6 10*3/uL (ref 0.1–1.0)
NEUTROS ABS: 2.3 10*3/uL (ref 1.7–7.7)
NEUTROS PCT: 66 %
Platelets: 162 10*3/uL (ref 150–400)
RBC: 3.89 MIL/uL — AB (ref 4.22–5.81)
RDW: 17 % — ABNORMAL HIGH (ref 11.5–15.5)
WBC: 3.5 10*3/uL — AB (ref 4.0–10.5)

## 2017-08-12 NOTE — Telephone Encounter (Signed)
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  Patient has completed chemotherapy and radiation therapy.   Met with patient following lab work today.  Patient came with caregiver.  Reports that he has not been feeling well recently (weak, fatigue).  Reports that he has been using his feeding tube more. Reports gave 3 osmolite 1.5 via PEG tube on the first part of the week, 2 osmolite 1.5 yesterday and as ate little more solid foods (eggs with cheese). Ate cream of wheat this am with chocolate milk.  Reports had nausea with drinking ensure enlive on Monday and has not drank any since then. Reports took nausea medication and it relieved symptoms.  Reports some issues with constipation (taking miralax and stool softners).     Medications: reviewed  Labs: reviewed  Anthropometrics:   Weight measured today by RD at 174 lb 3 oz decreased from 175 lb on 7/16.  Noted stable weight of 177-178 lb during treatment   Estimated Energy Needs  Kcals: 2025-2400 calories/d Protein: 101-120 g/d Fluid: 2.4 L/d  NUTRITION DIAGNOSIS: Inadequate oral intake continues   MALNUTRITION DIAGNOSIS: continue to monitor   INTERVENTION:  Discussed importance of replacing calories from ensure that he is not drinking with osmolite 1.5 or oral intake to meet nutritional needs.  Patient and caregiver planning to give osmolite 1.5 at 9am, 12 and 3pm.  Will continue to flush with 44m of water before and after feeding.   Encouraged hydration as well. Discussed warm prune juice and/or apple juice to help with constipation.  Already on medication.  Could consider trying jevity 1.5 (fiber containing formula) to stimulate bowels. Contact information provided    MONITORING, EVALUATION, GOAL: Patient will consume adequate calories and protein to maintain weight during treatment   NEXT VISIT: as needed  Audrina Marten B. AZenia Resides RKing[place] LWebbRegistered Dietitian [phone](pager)

## 2017-08-12 NOTE — Telephone Encounter (Signed)
Nutrition  Called patient this am and left message that RD would like to see him after lab work today at [facility].   Ian Christensen, [name], [name] Registered Dietitian [phone] (pager)

## 2017-08-12 NOTE — Progress Notes (Signed)
Nutrition  Weight entered into system of 174 lb 3 oz as RD weighed patient after labs drawn today.   Conita Amenta B. Zenia Resides, [place], [place] Registered Dietitian [phone] (pager)

## 2017-08-16 ENCOUNTER — Ambulatory Visit: Payer: PPO | Admitting: Nutrition

## 2017-08-16 NOTE — Progress Notes (Signed)
Patient contacted me by telephone to ask me some questions about foods he could increase. He also would like to know if he should continue using his tube feeding. Noted weight has decreased and was documented as 174 pounds down from 177.4 pounds July 10. Patient has decreased drinking Ensure Enlive but increased using Osmolite 1.5 via PEG. He reports he is using 4 Osmolite 1.5 daily. Provided patient with a list of soft foods for him to try.  Also discussed with his caregiver on preparation and blenderizing foods. Explained the importance of patient continuing 4 bottles of Osmolite 1.5 via PEG for healing and weight gain. Encouraged him to increase oral intake as tolerated. Recommended he continue MiraLAX and increased fluids for constipation. Teach back method used.  Recommended patient contact me again for any questions or concerns.  **Disclaimer: This note was dictated with voice recognition software. Similar sounding words can inadvertently be transcribed and this note may contain transcription errors which may not have been corrected upon publication of note.**

## 2017-08-17 ENCOUNTER — Other Ambulatory Visit (HOSPITAL_COMMUNITY): Payer: Self-pay | Admitting: *Deleted

## 2017-08-17 DIAGNOSIS — C155 Malignant neoplasm of lower third of esophagus: Secondary | ICD-10-CM

## 2017-08-18 MED ORDER — TRAMADOL HCL 50 MG PO TABS: 50.0000 mg | ORAL_TABLET | Freq: Two times a day (BID) | ORAL | 2 refills | Status: DC

## 2017-08-24 ENCOUNTER — Encounter (HOSPITAL_COMMUNITY): Payer: Self-pay | Admitting: Hematology

## 2017-08-24 ENCOUNTER — Inpatient Hospital Stay (HOSPITAL_COMMUNITY): Payer: PPO | Attending: Hematology | Admitting: Hematology

## 2017-08-24 ENCOUNTER — Inpatient Hospital Stay (HOSPITAL_COMMUNITY): Payer: PPO

## 2017-08-24 VITALS — BP 142/80 | HR 95 | Temp 97.8°F | Resp 16 | Wt 179.5 lb

## 2017-08-24 DIAGNOSIS — Z85048 Personal history of other malignant neoplasm of rectum, rectosigmoid junction, and anus: Secondary | ICD-10-CM | POA: Insufficient documentation

## 2017-08-24 DIAGNOSIS — Z8546 Personal history of malignant neoplasm of prostate: Secondary | ICD-10-CM | POA: Diagnosis not present

## 2017-08-24 DIAGNOSIS — Z931 Gastrostomy status: Secondary | ICD-10-CM | POA: Insufficient documentation

## 2017-08-24 DIAGNOSIS — C155 Malignant neoplasm of lower third of esophagus: Secondary | ICD-10-CM | POA: Diagnosis not present

## 2017-08-24 LAB — COMPREHENSIVE METABOLIC PANEL
ALK PHOS: 67 U/L (ref 38–126)
ALT: 24 U/L (ref 0–44)
AST: 21 U/L (ref 15–41)
Albumin: 3.5 g/dL (ref 3.5–5.0)
Anion gap: 7 (ref 5–15)
BUN: 20 mg/dL (ref 8–23)
CALCIUM: 9 mg/dL (ref 8.9–10.3)
CO2: 24 mmol/L (ref 22–32)
CREATININE: 0.87 mg/dL (ref 0.61–1.24)
Chloride: 104 mmol/L (ref 98–111)
GFR calc non Af Amer: >60 mL/min (ref >60)
GLUCOSE: 164 mg/dL — AB (ref 70–99)
Potassium: 3.8 mmol/L (ref 3.5–5.1)
SODIUM: 135 mmol/L (ref 135–145)
Total Bilirubin: 0.4 mg/dL (ref 0.3–1.2)
Total Protein: 6.3 g/dL — ABNORMAL LOW (ref 6.5–8.1)

## 2017-08-24 LAB — CBC WITH DIFFERENTIAL/PLATELET
Basophils Absolute: 0 10*3/uL (ref 0.0–0.1)
Basophils Relative: 0 %
EOS ABS: 0.1 10*3/uL (ref 0.0–0.7)
Eosinophils Relative: 1 %
HCT: 35.5 % — ABNORMAL LOW (ref 39.0–52.0)
HEMOGLOBIN: 11.7 g/dL — AB (ref 13.0–17.0)
LYMPHS ABS: 1.2 10*3/uL (ref 0.7–4.0)
LYMPHS PCT: 24 %
MCH: 32.7 pg (ref 26.0–34.0)
MCHC: 33 g/dL (ref 30.0–36.0)
MCV: 99.2 fL (ref 78.0–100.0)
Monocytes Absolute: 0.7 10*3/uL (ref 0.1–1.0)
Monocytes Relative: 14 %
NEUTROS PCT: 61 %
Neutro Abs: 3.2 10*3/uL (ref 1.7–7.7)
Platelets: 188 10*3/uL (ref 150–400)
RBC: 3.58 MIL/uL — AB (ref 4.22–5.81)
RDW: 18.3 % — ABNORMAL HIGH (ref 11.5–15.5)
WBC: 5.2 10*3/uL (ref 4.0–10.5)

## 2017-08-24 NOTE — Patient Instructions (Addendum)
[place] at [facility] Discharge Instructions  You saw Dr. Delton Coombes today. We will see you back in 4 weeks. Please have PET scan before visit,    Thank you for choosing [place] at [facility] to provide your oncology and hematology care.  To afford each patient quality time with our provider, please arrive at least 15 minutes before your scheduled appointment time.   If you have a lab appointment with the [place] please come in thru the  Main Entrance and check in at the main information desk  You need to re-schedule your appointment should you arrive 10 or more minutes late.  We strive to give you quality time with our providers, and arriving late affects you and other patients whose appointments are after yours.  Also, if you no show three or more times for appointments you may be dismissed from the clinic at the providers discretion.     Again, thank you for choosing [facility]  Our hope is that these requests will decrease the amount of time that you wait before being seen by our physicians.       _____________________________________________________________  Should you have questions after your visit to [facility], please contact our office at [phone] between the hours of 8:00 a.m. and 4:30 p.m.  Voicemails left after 4:00 p.m. will not be returned until the following business day.  For prescription refill requests, have your pharmacy contact our office and allow 72 hours.    Cancer Center Support Programs:   > Cancer Support Group  2nd Tuesday of the month 1pm-2pm, Journey Room

## 2017-08-24 NOTE — Progress Notes (Signed)
[address], [place] [address]   CLINIC:  Medical Oncology/Hematology  PCP:  [facility] [address], [place] [address] [phone]   REASON FOR VISIT:  Follow-up for esophogeal cancer  CURRENT THERAPY: finished treatment needs follow up scans  BRIEF ONCOLOGIC HISTORY:    Prostate cancer ([place])   12/11/2014 Procedure    Prostatectomy at time of rectal ca resection      12/17/2014 Pathology Results    PROSTATE: PROSTATIC ADENOCARCINOMA WITHOUT TREATMENT EFFECT, GLEASON SCORE 4+4=8 THE TUMOR LOCATED AT THE LEFT ANTERIOR APEX AND THE MID MARGIN OF RESECTION ARE POSITIVE AT THE LEFT ANTERIOR APEX (0.4 CM, GLEASON SCORE 4+4=8) OTHER MARGINS ARE NEGATIVE FOR TUMOR       Rectal cancer ([place])   10/02/2014 Procedure    Colonoscopy by Dr. Carlean Purl- One-third circumferential medium mass was found in the distal rectum; ulcerated, firm anterior over prostate bed, multiple biopsies were performed using cold forceps- suspect carcinoma ? rectal vs recurrence of prostate      10/04/2014 Pathology Results    Rectum, biopsy ulcerated mass-invasive adenocarcinoma consistent with colorectal origin.  CDX 2 positive.  PSA negative.      10/07/2014 Initial Diagnosis    Rectal cancer ([place])      10/08/2014 Imaging    CT CAP- 1. Wall thickening of the rectum compatible with recently diagnosed rectal carcinoma. Tiny subcentimeter perirectal lymph nodes. 2. Multiple bilateral pulmonary nodules as above. While these may be infectious or inflammatory in etiology, metastatic disease in the setting of known rectal carcinoma is not excluded. 3. Nodular soft tissue within the left mainstem bronchus which is nonspecific and may represent mucus however endobronchial nodule is not excluded. Recommend either attention on followup or correlation with bronchoscopy if the patient is having respiratory symptoms. 4. No evidence for focal hepatic  lesion. 5. Bilateral low-attenuation renal lesions with internal densities greater than that of fluid, indeterminate. Recommend attention on followup or definitive characterization with pre and post contrast-enhanced MRI.      10/10/2014 Procedure    EUS by Dr. Ardis Hughs- Non-circumferential, 3.4cm [phone] (Stage IIIB) rectal adenocarcinoma along the anterior wall of the distal rectum with distal edge 1-2cm from the anal verge.      12/11/2014 Procedure    Segmental resection for tumor by Dr. Leighton Ruff      76/19/5093 Pathology Results    Colon, segmental resection for tumor, Bladder and Prostate COLON: INVASIVE COLONIC ADENOCARCINOMA (3.5 CM IN GREATEST DIMENSION) THE CARCINOMA INVASIVE THROUGH THE MUSCULARIS PROPRIA AND PRESENTS AT THE ANTERIOR MESORECTUM RESECTION MARGIN (1.5 CM, R2) THE PROXIMAL AND DISTAL RESECTION MARGINS ARE NEGATIVE FOR TUMOR LYMPHOVASCULAR INVASION IS IDENTIFED ELEVEN BENIGN LYMPH NODES (0/11) PROSTATE: PROSTATIC ADENOCARCINOMA WITHOUT TREATMENT EFFECT, GLEASON SCORE 4+4=8 THE TUMOR LOCATED AT THE LEFT ANTERIOR APEX AND THE MID MARGIN OF RESECTION ARE POSITIVE AT THE LEFT ANTERIOR APEX (0.4 CM, GLEASON SCORE 4+4=8) OTHER MARGINS ARE NEGATIVE FOR TUMOR BLADDER: BENIGN UROTHELIUM AND SUBCUTANOUS TISSUE NEGATIVE FOR MALIGNANCY      06/20/2015 Imaging    CT CAP- 1. Interval cystoprostatectomy, urinary diversion, abdominal perineal resection and sigmoid colostomy. 2. No evidence of abdominal pelvic metastatic disease. 3. Stable small pulmonary nodules bilaterally. Based on stability over nearly 9 months, these are likely benign. Attention on continued follow-up recommended. 4. Stable low-density renal lesions bilaterally. 5. Moderate atherosclerosis.      12/31/2015 Procedure    Colonoscopy by Dr. Heron Sabins and colostomy in the sigmoid colon.  Diverticulosis in the left colon, examination otherwise normal.      06/21/2016 Imaging    CT CAP-  Stable exam. No definite evidence of recurrent or metastatic carcinoma.  Stable sub-cm noncalcified bilateral pulmonary nodules, most consistent with benign postinflammatory etiology. Recommend continued attention on follow-up imaging.  Cholelithiasis. No radiographic evidence of cholecystitis.  Aortic and coronary artery atherosclerosis.       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)      06/26/2017 -  Chemotherapy    The patient had palonosetron (ALOXI) injection 0.25 mg, 0.25 mg, Intravenous,  Once, 1 of 1 cycle Administration: 0.25 mg (06/27/2017), 0.25 mg (07/05/2017), 0.25 mg (07/12/2017), 0.25 mg (07/19/2017), 0.25 mg (07/26/2017), 0.25 mg (08/02/2017) CARBOplatin (PARAPLATIN) 150 mg in sodium chloride 0.9 % 100 mL chemo infusion, 150 mg (100 % of original dose 150 mg), Intravenous,  Once, 1 of 1 cycle Dose modification: 150 mg (original dose 150 mg, Cycle 1, Reason: Provider Judgment), 125 mg (original dose 125 mg, Cycle 1, Reason: Provider Judgment),   (original dose 150 mg, Cycle 1), 150 mg (original dose 150 mg, Cycle 1, Reason: Provider Judgment), 150 mg (original dose 150 mg, Cycle 1, Reason: Provider Judgment), 150 mg (original dose 150 mg, Cycle 1, Reason: Provider Judgment), 150 mg (original dose 150 mg, Cycle 1, Reason: Provider Judgment) Administration: 150 mg (06/27/2017), 130 mg (07/05/2017), 150 mg (07/12/2017), 150 mg (07/19/2017), 150 mg (07/26/2017), 150 mg (08/02/2017) PACLitaxel (TAXOL) 78 mg in sodium chloride 0.9 % 250 mL chemo infusion (</= 80mg /m2), 40 mg/m2 = 78 mg (80 % of original dose 50 mg/m2), Intravenous,  Once, 1 of 1 cycle Dose modification: 40 mg/m2 (80 % of original dose 50 mg/m2, Cycle 1, Reason: Provider Judgment), 40 mg/m2 (80 % of original dose 50 mg/m2, Cycle 1, Reason: Provider Judgment) Administration: 78 mg (06/27/2017), 78 mg  (07/05/2017), 78 mg (07/12/2017), 78 mg (07/19/2017), 78 mg (07/26/2017), 78 mg (08/02/2017)  for chemotherapy treatment.         CANCER STAGING: Cancer Staging Rectal cancer [facility]) Staging form: Colon and Rectum, AJCC 8th Edition - Pathologic stage from 05/31/2016: Stage IIA (pT3, pN0, cM0) - Signed by Baird Cancer, PA-C on 05/31/2016    INTERVAL HISTORY:  Mr. Kinker 84 y.o. male returns for routine follow-up esophageal cancer. Patient is here today with his daughter and his other daughter joined [place] via phone. Patient still has his peg tube. He is still receiving tube feeding 4 cans a day but is slowly starting to eat more solid foods. He has started with soft foods only for now. He is having trouble sleeping at night. He has pains in his feet and legs at night time. Patient lives at home with his wife. His daughters help him with meals. He performs all his own ADLs. He Denies any new pains. Denies any nausea, vomiting, or diarrhea. Denies any SOB. Denies any bleeding.    REVIEW OF SYSTEMS:  Review of Systems  Constitutional: Positive for fatigue.  HENT:  Negative.   Eyes: Negative.   Respiratory: Negative.   Cardiovascular: Negative.   Gastrointestinal: Positive for constipation.  Endocrine: Negative.   Genitourinary: Negative.    Musculoskeletal: Positive for back pain.  Skin: Negative.   Neurological: Positive for dizziness and extremity weakness.  Hematological: Negative.   Psychiatric/Behavioral: Positive for sleep disturbance.     PAST MEDICAL/SURGICAL HISTORY:  Past Medical History:  Diagnosis Date  . Arthritis   . Colostomy in place [facility])   . Esophageal cancer ([place])   . GERD (gastroesophageal reflux disease)   . History of colon polyps   . History of hiatal hernia   . 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 HTN (hypertension)    followed by pcp  .  Hyperlipidemia   . Presence of urostomy ([place])   . Rectal cancer ([place]) 10/07/2014   no chemo or radiation/ bladder was removed also  . Tinnitus    per pt intermittant   . Wears glasses    Past Surgical History:  Procedure Laterality Date  . APPENDECTOMY  1955  . COLONOSCOPY    . CYSTO N/A 12/11/2014   Procedure:  CYSTO,  PROSTATECTOMY, CYSTECTOMY colon conduit, BILATERAL LYMPH NODE DISSECTION;  Surgeon: Alexis Frock, MD;  Location: [facility]S;  Service: Urology;  Laterality: N/A;  . ESOPHAGOGASTRODUODENOSCOPY (EGD) WITH PROPOFOL N/A 07/05/2017   Procedure: ESOPHAGOGASTRODUODENOSCOPY (EGD) WITH PROPOFOL;  Surgeon: Aviva Signs, MD;  Location: [facility];  Service: General;  Laterality: N/A;  . EUS N/A 10/10/2014   Procedure: LOWER ENDOSCOPIC ULTRASOUND (EUS);  Surgeon: Milus Banister, MD;  Location: [facility];  Service: Endoscopy;  Laterality: N/A;  . HERNIA REPAIR    . INCISIONAL HERNIA REPAIR N/A 03/02/2017   Procedure: INCISIONAL HERNIA REPAIR WITH MESH;  Surgeon: Leighton Ruff, MD;  Location: [facility];  Service: General;  Laterality: N/A;  . INSERTION OF MESH  03/02/2017   Procedure: INSERTION OF MESH;  Surgeon: Leighton Ruff, MD;  Location: [facility];  Service: General;;  . LAPAROSCOPIC INTERNAL HERNIA REPAIR N/A 03/06/2017   Procedure: REOPENING OF RECENT LAPAROTOMY WITH CLOSURE OF INCISIONAL HERNIA WITH MESH;  Surgeon: Leighton Ruff, MD;  Location: [facility];  Service: General;  Laterality: N/A;  . PEG PLACEMENT N/A 07/05/2017   Procedure: PERCUTANEOUS ENDOSCOPIC GASTROSTOMY (PEG) PLACEMENT;  Surgeon: Aviva Signs, MD;  Location: [facility];  Service: General;  Laterality: N/A;  . PORTACATH PLACEMENT Left 07/05/2017   Procedure: INSERTION PORT WITH ATTACHED CATHETER IN LEFT SUBCLAVIAN;  Surgeon: Aviva Signs, MD;  Location: [facility];  Service: General;  Laterality: Left;  . RADIOACTIVE PROSTATE SEED IMPLANTS  02-06-1999  dr Jeffie Pollock  [facility]  . XI ROBOTIC ASSISTED LOWER ANTERIOR RESECTION N/A 12/11/2014    Procedure: XI ROBOTIC ASSISTED ABDOMINAL PERINEAL RESECTION OMENTOPLASTY;  Surgeon: Leighton Ruff, MD;  Location: [facility];  Service: General;  Laterality: N/A;     SOCIAL HISTORY:  Social History   Socioeconomic History  . Marital status: Widowed    Spouse name: Not on file  . Number of children: 2  . Years of education: Not on file  . Highest education level: Not on file  Occupational History  . Occupation: retired  Scientific laboratory technician  . Financial resource strain: Not very hard  . Food insecurity:    Worry: Never true    Inability: Never true  . Transportation needs:    Medical: No    Non-medical: No  Tobacco Use  . Smoking status: Never Smoker  . Smokeless tobacco: Never Used  Substance and Sexual Activity  . Alcohol use: Yes    Alcohol/week: 0.0 oz    Comment: seldom  . Drug use: No  . Sexual activity: Not Currently    Comment: Widowed  Lifestyle  . Physical activity:  Days per week: 1 day    Minutes per session: 30 min  . Stress: Not at all  Relationships  . Social connections:    Talks on phone: Three times a week    Gets together: Three times a week    Attends religious service: More than 4 times per year    Active member of club or organization: Yes    Attends meetings of clubs or organizations: More than 4 times per year    Relationship status: Widowed  . Intimate partner violence:    Fear of current or ex partner: No    Emotionally abused: No    Physically abused: No    Forced sexual activity: No  Other Topics Concern  . Not on file  Social History Narrative  . Not on file    FAMILY HISTORY:  Family History  Problem Relation Age of Onset  . Diabetes Mother   . Heart disease Mother   . Heart disease Father   . Colon cancer Sister   . Prostate cancer Brother   . Esophageal cancer Neg Hx   . Rectal cancer Neg Hx   . Stomach cancer Neg Hx     CURRENT MEDICATIONS:  Outpatient Encounter Medications as of 08/24/2017  Medication Sig  .  ACETAMINOPHEN PO Take 650 mg by mouth every 8 (eight) hours as needed (for pain).   Marland Kitchen aspirin EC 81 MG tablet Take 162 mg by mouth at bedtime. Takes 30 minutes prior to Niacin  . cyanocobalamin 500 MCG tablet Take 500 mcg by mouth daily. Vitamin B-12  . HYDROcodone-acetaminophen (HYCET) 7.5-325 mg/15 ml solution Take 10-15 mLs by mouth 4 (four) times daily as needed for moderate pain.  Marland Kitchen lidocaine-prilocaine (EMLA) cream Apply a quarter size amount to port site 1 hour prior to chemo. Do not rub in. Cover with plastic wrap.  . losartan (COZAAR) 25 MG tablet Take 25 mg by mouth daily.  . metoprolol succinate (TOPROL-XL) 25 MG 24 hr tablet Take 25 mg by mouth 2 (two) times daily.  . niacin (NIASPAN) 500 MG CR tablet Take 500 mg by mouth every evening.   Marland Kitchen omeprazole (PRILOSEC) 20 MG capsule Take 20 mg by mouth every evening.   . prochlorperazine (COMPAZINE) 10 MG tablet Take 1 tablet (10 mg total) by mouth every 6 (six) hours as needed for nausea or vomiting.  . simvastatin (ZOCOR) 20 MG tablet Take 20 mg by mouth every evening.   . sucralfate (CARAFATE) 1 g tablet Crush and mix in 1 oz of water q AC and HS.  . traMADol (ULTRAM) 50 MG tablet Take 1 tablet (50 mg total) by mouth 2 (two) times daily.   No facility-administered encounter medications on file as of 08/24/2017.     ALLERGIES:  Allergies  Allergen Reactions  . Morphine And Related     Caused hallucinations.  . Oxycodone     Caused hallucinations     PHYSICAL EXAM:  ECOG Performance status: 1  Vitals:   08/24/17 1340  BP: (!) 142/80  Pulse: 95  Resp: 16  Temp: 97.8 F (36.6 C)  SpO2: 99%   Filed Weights   08/24/17 1340  Weight: 179 lb 8 oz (81.4 kg)    Physical Exam  Constitutional: He is oriented to person, place, and time.  Cardiovascular: Normal rate, regular rhythm and normal heart sounds.  Pulmonary/Chest: Effort normal and breath sounds normal.  Neurological: He is alert and oriented to person, place, and  time.  Skin: Skin is warm and dry.     LABORATORY DATA:  I have reviewed the labs as listed.  CBC    Component Value Date/Time   WBC 5.2 08/24/2017 1251   RBC 3.58 (L) 08/24/2017 1251   HGB 11.7 (L) 08/24/2017 1251   HCT 35.5 (L) 08/24/2017 1251   PLT 188 08/24/2017 1251   MCV 99.2 08/24/2017 1251   MCH 32.7 08/24/2017 1251   MCHC 33.0 08/24/2017 1251   RDW 18.3 (H) 08/24/2017 1251   LYMPHSABS 1.2 08/24/2017 1251   MONOABS 0.7 08/24/2017 1251   EOSABS 0.1 08/24/2017 1251   BASOSABS 0.0 08/24/2017 1251   CMP Latest Ref Rng & Units 08/24/2017 08/12/2017 08/02/2017  Glucose 70 - 99 mg/dL 164(H) 124(H) 120(H)  BUN 8 - 23 mg/dL 20 22 26(H)  Creatinine 0.61 - 1.24 mg/dL 0.87 0.88 0.86  Sodium 135 - 145 mmol/L 135 137 139  Potassium 3.5 - 5.1 mmol/L 3.8 3.9 4.0  Chloride 98 - 111 mmol/L 104 104 106  CO2 22 - 32 mmol/L 24 25 24   Calcium 8.9 - 10.3 mg/dL 9.0 9.2 9.1  Total Protein 6.5 - 8.1 g/dL 6.3(L) 6.8 6.6  Total Bilirubin 0.3 - 1.2 mg/dL 0.4 0.7 0.6  Alkaline Phos 38 - 126 U/L 67 88 53  AST 15 - 41 U/L 21 24 18   ALT 0 - 44 U/L 24 28 16       ASSESSMENT & PLAN:   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 - Barium swallow done at [facility] showed a large mass in the distal esophagus. - PET/CT scan on 1/58/3094 hypermetabolic solid 3.5 x 2.3 cm esophageal mass in the mid to lower third of the thoracic esophagus with max SUV 7.3 and nonspecific mildly hypermetabolic nonenlarged bilateral hilar lymph nodes, indeterminate.  We discussed this with the patient in detail. - EGD on 05/26/2017 shows large fungating mass with no bleeding, 30 cm from incisors, extends to 35 cm, partially obstructing, biopsy consistent with squamous cell carcinoma. - Started on combination chemoradiation therapy with weekly carboplatin and paclitaxel on 06/27/2017.   He has developed worsening of difficulty swallowing.  He had a PEG tube placed on 07/05/2017 along with the Port-A-Cath on the same day.  He is taking and 1 can of Osmolite 1.5/day.  He is able to drink 3 ensures per day.  He is also eating soft foods. - He finished last cycle of chemotherapy on 08/02/2017 and radiation on 08/05/2017. -His swallowing ability is continuing to improve at this time.  He still uses Carafate for pain in his throat and retrosternal area.  However his eating ability has improved.  I have told him to cut back on the right tube feeds to 3 cans/day if he is able to eat well.  He can even cut back to 2 cans/day in the next 4 weeks.  I will arrange for a PET CT scan in 4 weeks.  I will see him back after the PET CT scan to discuss the results.  This will be followed by EGD.  2.  Colorectal cancer: - Underwent segmental colon resection in November 2016, found to have a T3N0 adenocarcinoma, with circumferential margins positive.  Patient did not receive any neoadjuvant or adjuvant chemotherapy/radiation therapy.  3.  Prostate cancer: -Originally diagnosed in early 2000s, status post seed implants.  Underwent cystoprostatectomy in  November 2016, pathology showed prostatic adenocarcinoma Gleason 8 (4+4).      Orders placed this encounter:  Orders Placed This Encounter  Procedures  . NM PET Image Restag (PS) Skull Base To Thigh  . CBC with Differential/Platelet  . Comprehensive metabolic panel      Derek Jack, MD [place] [phone]

## 2017-08-24 NOTE — Assessment & Plan Note (Signed)
1.  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 - Barium swallow done at [facility] showed a large mass in the distal esophagus. - PET/CT scan on 3/70/4888 hypermetabolic solid 3.5 x 2.3 cm esophageal mass in the mid to lower third of the thoracic esophagus with max SUV 7.3 and nonspecific mildly hypermetabolic nonenlarged bilateral hilar lymph nodes, indeterminate.  We discussed this with the patient in detail. - EGD on 05/26/2017 shows large fungating mass with no bleeding, 30 cm from incisors, extends to 35 cm, partially obstructing, biopsy consistent with squamous cell carcinoma. - Started on combination chemoradiation therapy with weekly carboplatin and paclitaxel on 06/27/2017.  He has developed worsening of difficulty swallowing.  He had a PEG tube placed on 07/05/2017 along with the Port-A-Cath on the same day.  He is taking and 1 can of Osmolite 1.5/day.  He is able to drink 3 ensures per day.  He is also eating soft foods. - He finished last cycle of chemotherapy on 08/02/2017 and radiation on 08/05/2017. -His swallowing ability is continuing to improve at this time.  He still uses Carafate for pain in his throat and retrosternal area.  However his eating ability has improved.  I have told him to cut back on the right tube feeds to 3 cans/day if he is able to eat well.  He can even cut back to 2 cans/day in the next 4 weeks.  I will arrange for a PET CT scan in 4 weeks.  I will see him back after the PET CT scan to discuss the results.  This will be followed by EGD.  2.  Colorectal cancer: - Underwent segmental colon resection in November 2016, found to have a T3N0 adenocarcinoma, with circumferential margins positive.  Patient did not receive any neoadjuvant or adjuvant chemotherapy/radiation therapy.  3.  Prostate cancer: -Originally diagnosed in early 2000s, status post seed implants.  Underwent cystoprostatectomy in November  2016, pathology showed prostatic adenocarcinoma Gleason 8 (4+4).

## 2017-08-30 DIAGNOSIS — E782 Mixed hyperlipidemia: Secondary | ICD-10-CM | POA: Diagnosis not present

## 2017-08-30 DIAGNOSIS — C61 Malignant neoplasm of prostate: Secondary | ICD-10-CM | POA: Diagnosis not present

## 2017-08-30 DIAGNOSIS — E663 Overweight: Secondary | ICD-10-CM | POA: Diagnosis not present

## 2017-08-30 DIAGNOSIS — C159 Malignant neoplasm of esophagus, unspecified: Secondary | ICD-10-CM | POA: Diagnosis not present

## 2017-08-30 DIAGNOSIS — Z6827 Body mass index (BMI) 27.0-27.9, adult: Secondary | ICD-10-CM | POA: Diagnosis not present

## 2017-08-30 DIAGNOSIS — Z1389 Encounter for screening for other disorder: Secondary | ICD-10-CM | POA: Diagnosis not present

## 2017-08-30 DIAGNOSIS — I10 Essential (primary) hypertension: Secondary | ICD-10-CM | POA: Diagnosis not present

## 2017-08-30 DIAGNOSIS — C20 Malignant neoplasm of rectum: Secondary | ICD-10-CM | POA: Diagnosis not present

## 2017-08-30 DIAGNOSIS — Z0001 Encounter for general adult medical examination with abnormal findings: Secondary | ICD-10-CM | POA: Diagnosis not present

## 2017-09-16 ENCOUNTER — Ambulatory Visit (HOSPITAL_COMMUNITY)
Admission: RE | Admit: 2017-09-16 | Discharge: 2017-09-16 | Disposition: A | Discharge status: Home / Self Care | Payer: PPO | Source: Ambulatory Visit | Attending: Nurse Practitioner | Admitting: Nurse Practitioner

## 2017-09-16 DIAGNOSIS — C787 Secondary malignant neoplasm of liver and intrahepatic bile duct: Secondary | ICD-10-CM | POA: Insufficient documentation

## 2017-09-16 DIAGNOSIS — C155 Malignant neoplasm of lower third of esophagus: Secondary | ICD-10-CM | POA: Insufficient documentation

## 2017-09-16 DIAGNOSIS — R590 Localized enlarged lymph nodes: Secondary | ICD-10-CM | POA: Diagnosis not present

## 2017-09-16 DIAGNOSIS — R911 Solitary pulmonary nodule: Secondary | ICD-10-CM | POA: Insufficient documentation

## 2017-09-16 DIAGNOSIS — C159 Malignant neoplasm of esophagus, unspecified: Secondary | ICD-10-CM | POA: Diagnosis not present

## 2017-09-16 LAB — GLUCOSE, CAPILLARY: Glucose-Capillary: 94 mg/dL (ref 70–99)

## 2017-09-16 MED ORDER — FLUDEOXYGLUCOSE F - 18 (FDG) INJECTION
9.0000 | Freq: Once | INTRAVENOUS | Status: AC | PRN
  Administered 2017-09-16: 9 via INTRAVENOUS

## 2017-09-20 ENCOUNTER — Inpatient Hospital Stay (HOSPITAL_BASED_OUTPATIENT_CLINIC_OR_DEPARTMENT_OTHER): Payer: PPO | Admitting: Hematology

## 2017-09-20 ENCOUNTER — Other Ambulatory Visit: Payer: Self-pay

## 2017-09-20 ENCOUNTER — Inpatient Hospital Stay (HOSPITAL_COMMUNITY): Payer: PPO | Attending: Hematology

## 2017-09-20 ENCOUNTER — Encounter (HOSPITAL_COMMUNITY): Payer: Self-pay | Admitting: Hematology

## 2017-09-20 VITALS — BP 150/70 | HR 79 | Temp 98.2°F | Resp 16 | Wt 180.2 lb

## 2017-09-20 DIAGNOSIS — R5383 Other fatigue: Secondary | ICD-10-CM | POA: Insufficient documentation

## 2017-09-20 DIAGNOSIS — C155 Malignant neoplasm of lower third of esophagus: Secondary | ICD-10-CM

## 2017-09-20 DIAGNOSIS — Z8546 Personal history of malignant neoplasm of prostate: Secondary | ICD-10-CM | POA: Diagnosis not present

## 2017-09-20 DIAGNOSIS — C439 Malignant melanoma of skin, unspecified: Secondary | ICD-10-CM | POA: Insufficient documentation

## 2017-09-20 DIAGNOSIS — Z85048 Personal history of other malignant neoplasm of rectum, rectosigmoid junction, and anus: Secondary | ICD-10-CM | POA: Diagnosis not present

## 2017-09-20 DIAGNOSIS — I10 Essential (primary) hypertension: Secondary | ICD-10-CM | POA: Insufficient documentation

## 2017-09-20 DIAGNOSIS — C787 Secondary malignant neoplasm of liver and intrahepatic bile duct: Secondary | ICD-10-CM | POA: Diagnosis not present

## 2017-09-20 LAB — CBC WITH DIFFERENTIAL/PLATELET
Basophils Absolute: 0.1 10*3/uL (ref 0.0–0.1)
Basophils Relative: 1 %
EOS PCT: 5 %
Eosinophils Absolute: 0.3 10*3/uL (ref 0.0–0.7)
HEMATOCRIT: 37.2 % — AB (ref 39.0–52.0)
Hemoglobin: 12.1 g/dL — ABNORMAL LOW (ref 13.0–17.0)
LYMPHS ABS: 1.1 10*3/uL (ref 0.7–4.0)
LYMPHS PCT: 17 %
MCH: 33.2 pg (ref 26.0–34.0)
MCHC: 32.5 g/dL (ref 30.0–36.0)
MCV: 102.2 fL — AB (ref 78.0–100.0)
Monocytes Absolute: 1 10*3/uL (ref 0.1–1.0)
Monocytes Relative: 16 %
Neutro Abs: 3.9 10*3/uL (ref 1.7–7.7)
Neutrophils Relative %: 61 %
PLATELETS: 171 10*3/uL (ref 150–400)
RBC: 3.64 MIL/uL — ABNORMAL LOW (ref 4.22–5.81)
RDW: 14.9 % (ref 11.5–15.5)
WBC: 6.4 10*3/uL (ref 4.0–10.5)

## 2017-09-20 LAB — COMPREHENSIVE METABOLIC PANEL
ALBUMIN: 3.6 g/dL (ref 3.5–5.0)
ALT: 34 U/L (ref 0–44)
AST: 33 U/L (ref 15–41)
Alkaline Phosphatase: 125 U/L (ref 38–126)
Anion gap: 6 (ref 5–15)
BUN: 16 mg/dL (ref 8–23)
CHLORIDE: 106 mmol/L (ref 98–111)
CO2: 24 mmol/L (ref 22–32)
CREATININE: 0.87 mg/dL (ref 0.61–1.24)
Calcium: 9.1 mg/dL (ref 8.9–10.3)
GFR calc Af Amer: >60 mL/min (ref >60)
GFR calc non Af Amer: >60 mL/min (ref >60)
GLUCOSE: 111 mg/dL — AB (ref 70–99)
POTASSIUM: 3.9 mmol/L (ref 3.5–5.1)
SODIUM: 136 mmol/L (ref 135–145)
Total Bilirubin: 0.6 mg/dL (ref 0.3–1.2)
Total Protein: 6.6 g/dL (ref 6.5–8.1)

## 2017-09-20 NOTE — Progress Notes (Signed)
Ian Christensen, [place] [id number]   CLINIC:  Medical Oncology/Hematology  PCP:  [facility], [place] [address] [phone]   REASON FOR VISIT:  Follow-up for esophogeal cancer  CURRENT THERAPY: Observation /FU scans  BRIEF ONCOLOGIC HISTORY:    Prostate cancer ([place])   12/11/2014 Procedure    Prostatectomy at time of rectal ca resection    12/17/2014 Pathology Results    PROSTATE: PROSTATIC ADENOCARCINOMA WITHOUT TREATMENT EFFECT, GLEASON SCORE 4+4=8 THE TUMOR LOCATED AT THE LEFT ANTERIOR APEX AND THE MID MARGIN OF RESECTION ARE POSITIVE AT THE LEFT ANTERIOR APEX (0.4 CM, GLEASON SCORE 4+4=8) OTHER MARGINS ARE NEGATIVE FOR TUMOR     Rectal cancer ([place])   10/02/2014 Procedure    Colonoscopy by Dr. Carlean Purl- One-third circumferential medium mass was found in the distal rectum; ulcerated, firm anterior over prostate bed, multiple biopsies were performed using cold forceps- suspect carcinoma ? rectal vs recurrence of prostate    10/04/2014 Pathology Results    Rectum, biopsy ulcerated mass-invasive adenocarcinoma consistent with colorectal origin.  CDX 2 positive.  PSA negative.    10/07/2014 Initial Diagnosis    Rectal cancer ([place])    10/08/2014 Imaging    CT CAP- 1. Wall thickening of the rectum compatible with recently diagnosed rectal carcinoma. Tiny subcentimeter perirectal lymph nodes. 2. Multiple bilateral pulmonary nodules as above. While these may be infectious or inflammatory in etiology, metastatic disease in the setting of known rectal carcinoma is not excluded. 3. Nodular soft tissue within the left mainstem bronchus which is nonspecific and may represent mucus however endobronchial nodule is not excluded. Recommend either attention on followup or correlation with bronchoscopy if the patient is having respiratory symptoms. 4. No evidence for focal hepatic lesion. 5. Bilateral low-attenuation  renal lesions with internal densities greater than that of fluid, indeterminate. Recommend attention on followup or definitive characterization with pre and post contrast-enhanced MRI.    10/10/2014 Procedure    EUS by Dr. Ardis Hughs- Non-circumferential, 3.4cm [phone] (Stage IIIB) rectal adenocarcinoma along the anterior wall of the distal rectum with distal edge 1-2cm from the anal verge.    12/11/2014 Procedure    Segmental resection for tumor by Dr. Leighton Ruff    01/60/1093 Pathology Results    Colon, segmental resection for tumor, Bladder and Prostate COLON: INVASIVE COLONIC ADENOCARCINOMA (3.5 CM IN GREATEST DIMENSION) THE CARCINOMA INVASIVE THROUGH THE MUSCULARIS PROPRIA AND PRESENTS AT THE ANTERIOR MESORECTUM RESECTION MARGIN (1.5 CM, R2) THE PROXIMAL AND DISTAL RESECTION MARGINS ARE NEGATIVE FOR TUMOR LYMPHOVASCULAR INVASION IS IDENTIFED ELEVEN BENIGN LYMPH NODES (0/11) PROSTATE: PROSTATIC ADENOCARCINOMA WITHOUT TREATMENT EFFECT, GLEASON SCORE 4+4=8 THE TUMOR LOCATED AT THE LEFT ANTERIOR APEX AND THE MID MARGIN OF RESECTION ARE POSITIVE AT THE LEFT ANTERIOR APEX (0.4 CM, GLEASON SCORE 4+4=8) OTHER MARGINS ARE NEGATIVE FOR TUMOR BLADDER: BENIGN UROTHELIUM AND SUBCUTANOUS TISSUE NEGATIVE FOR MALIGNANCY    06/20/2015 Imaging    CT CAP- 1. Interval cystoprostatectomy, urinary diversion, abdominal perineal resection and sigmoid colostomy. 2. No evidence of abdominal pelvic metastatic disease. 3. Stable small pulmonary nodules bilaterally. Based on stability over nearly 9 months, these are likely benign. Attention on continued follow-up recommended. 4. Stable low-density renal lesions bilaterally. 5. Moderate atherosclerosis.    12/31/2015 Procedure    Colonoscopy by Dr. Heron Sabins and colostomy in the sigmoid colon.  Diverticulosis in the left colon, examination otherwise normal.    06/21/2016 Imaging    CT CAP- Stable exam. No definite  evidence of recurrent or  metastatic carcinoma.  Stable sub-cm noncalcified bilateral pulmonary nodules, most consistent with benign postinflammatory etiology. Recommend continued attention on follow-up imaging.  Cholelithiasis. No radiographic evidence of cholecystitis.  Aortic and coronary artery atherosclerosis.     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)    06/26/2017 -  Chemotherapy    The patient had palonosetron (ALOXI) injection 0.25 mg, 0.25 mg, Intravenous,  Once, 1 of 1 cycle Administration: 0.25 mg (06/27/2017), 0.25 mg (07/05/2017), 0.25 mg (07/12/2017), 0.25 mg (07/19/2017), 0.25 mg (07/26/2017), 0.25 mg (08/02/2017) CARBOplatin (PARAPLATIN) 150 mg in sodium chloride 0.9 % 100 mL chemo infusion, 150 mg (100 % of original dose 150 mg), Intravenous,  Once, 1 of 1 cycle Dose modification: 150 mg (original dose 150 mg, Cycle 1, Reason: Provider Judgment), 125 mg (original dose 125 mg, Cycle 1, Reason: Provider Judgment),   (original dose 150 mg, Cycle 1), 150 mg (original dose 150 mg, Cycle 1, Reason: Provider Judgment), 150 mg (original dose 150 mg, Cycle 1, Reason: Provider Judgment), 150 mg (original dose 150 mg, Cycle 1, Reason: Provider Judgment), 150 mg (original dose 150 mg, Cycle 1, Reason: Provider Judgment) Administration: 150 mg (06/27/2017), 130 mg (07/05/2017), 150 mg (07/12/2017), 150 mg (07/19/2017), 150 mg (07/26/2017), 150 mg (08/02/2017) PACLitaxel (TAXOL) 78 mg in sodium chloride 0.9 % 250 mL chemo infusion (</= 80mg /m2), 40 mg/m2 = 78 mg (80 % of original dose 50 mg/m2), Intravenous,  Once, 1 of 1 cycle Dose modification: 40 mg/m2 (80 % of original dose 50 mg/m2, Cycle 1, Reason: Provider Judgment), 40 mg/m2 (80 % of original dose 50 mg/m2, Cycle 1, Reason: Provider Judgment) Administration: 78 mg (06/27/2017), 78 mg (07/05/2017), 78 mg (07/12/2017), 78 mg (07/19/2017), 78 mg (07/26/2017),  78 mg (08/02/2017)  for chemotherapy treatment.       CANCER STAGING: Cancer Staging Rectal cancer [facility]) Staging form: Colon and Rectum, AJCC 8th Edition - Pathologic stage from 05/31/2016: Stage IIA (pT3, pN0, cM0) - Signed by Baird Cancer, PA-C on 05/31/2016    INTERVAL HISTORY:  Mr. Heindel 84 y.o. male returns for routine follow-up for esophogeal cancer. Patient is here with his daughters. He is able to eat most foods that he wants. He is still taking one can of tube feedings daily. Patient states he is starting to feel better and is gaining his energy back. His appetite is 75% and he is maintaining his weight and actually gained weight. Patient states his energy level is 50%. Patient denies any coughing or hemoptysis. Denies any new pain. Denies any nausea, vomiting, or diarrhea. Denies any headaches or vision changes.    REVIEW OF SYSTEMS:  Review of Systems  Constitutional: Positive for fatigue.  Hematological: Bruises/bleeds easily.  All other systems reviewed and are negative.    PAST MEDICAL/SURGICAL HISTORY:  Past Medical History:  Diagnosis Date  . Arthritis   . Colostomy in place [facility])   . Esophageal cancer ([place])   . GERD (gastroesophageal reflux disease)   . History of colon polyps   . History of hiatal hernia   . 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 HTN (hypertension)    followed by pcp  . Hyperlipidemia   . Presence of urostomy ([place])   . Rectal  cancer ([place]) 10/07/2014   no chemo or radiation/ bladder was removed also  . Tinnitus    per pt intermittant   . Wears glasses    Past Surgical History:  Procedure Laterality Date  . APPENDECTOMY  1955  . COLONOSCOPY    . CYSTO N/A 12/11/2014   Procedure:  CYSTO,  PROSTATECTOMY, CYSTECTOMY colon conduit, BILATERAL LYMPH NODE DISSECTION;  Surgeon: Alexis Frock, MD;  Location: [facility];  Service:  Urology;  Laterality: N/A;  . ESOPHAGOGASTRODUODENOSCOPY (EGD) WITH PROPOFOL N/A 07/05/2017   Procedure: ESOPHAGOGASTRODUODENOSCOPY (EGD) WITH PROPOFOL;  Surgeon: Aviva Signs, MD;  Location: [facility];  Service: General;  Laterality: N/A;  . EUS N/A 10/10/2014   Procedure: LOWER ENDOSCOPIC ULTRASOUND (EUS);  Surgeon: Milus Banister, MD;  Location: [facility];  Service: Endoscopy;  Laterality: N/A;  . HERNIA REPAIR    . INCISIONAL HERNIA REPAIR N/A 03/02/2017   Procedure: INCISIONAL HERNIA REPAIR WITH MESH;  Surgeon: Leighton Ruff, MD;  Location: [facility];  Service: General;  Laterality: N/A;  . INSERTION OF MESH  03/02/2017   Procedure: INSERTION OF MESH;  Surgeon: Leighton Ruff, MD;  Location: [facility];  Service: General;;  . LAPAROSCOPIC INTERNAL HERNIA REPAIR N/A 03/06/2017   Procedure: REOPENING OF RECENT LAPAROTOMY WITH CLOSURE OF INCISIONAL HERNIA WITH MESH;  Surgeon: Leighton Ruff, MD;  Location: [facility];  Service: General;  Laterality: N/A;  . PEG PLACEMENT N/A 07/05/2017   Procedure: PERCUTANEOUS ENDOSCOPIC GASTROSTOMY (PEG) PLACEMENT;  Surgeon: Aviva Signs, MD;  Location: [facility];  Service: General;  Laterality: N/A;  . PORTACATH PLACEMENT Left 07/05/2017   Procedure: INSERTION PORT WITH ATTACHED CATHETER IN LEFT SUBCLAVIAN;  Surgeon: Aviva Signs, MD;  Location: [facility];  Service: General;  Laterality: Left;  . RADIOACTIVE PROSTATE SEED IMPLANTS  02-06-1999  dr Jeffie Pollock  [facility]  . XI ROBOTIC ASSISTED LOWER ANTERIOR RESECTION N/A 12/11/2014   Procedure: XI ROBOTIC ASSISTED ABDOMINAL PERINEAL RESECTION OMENTOPLASTY;  Surgeon: Leighton Ruff, MD;  Location: [facility];  Service: General;  Laterality: N/A;     SOCIAL HISTORY:  Social History   Socioeconomic History  . Marital status: Widowed    Spouse name: Not on file  . Number of children: 2  . Years of education: Not on file  . Highest education level: Not on file  Occupational History  . Occupation: retired  Scientific laboratory technician  . Financial  resource strain: Not very hard  . Food insecurity:    Worry: Never true    Inability: Never true  . Transportation needs:    Medical: No    Non-medical: No  Tobacco Use  . Smoking status: Never Smoker  . Smokeless tobacco: Never Used  Substance and Sexual Activity  . Alcohol use: Yes    Alcohol/week: 0.0 standard drinks    Comment: seldom  . Drug use: No  . Sexual activity: Not Currently    Comment: Widowed  Lifestyle  . Physical activity:    Days per week: 1 day    Minutes per session: 30 min  . Stress: Not at all  Relationships  . Social connections:    Talks on phone: Three times a week    Gets together: Three times a week    Attends religious service: More than 4 times per year    Active member of club or organization: Yes    Attends meetings of clubs or organizations: More than 4 times per year    Relationship status: Widowed  . Intimate partner violence:  Fear of current or ex partner: No    Emotionally abused: No    Physically abused: No    Forced sexual activity: No  Other Topics Concern  . Not on file  Social History Narrative  . Not on file    FAMILY HISTORY:  Family History  Problem Relation Age of Onset  . Diabetes Mother   . Heart disease Mother   . Heart disease Father   . Colon cancer Sister   . Prostate cancer Brother   . Esophageal cancer Neg Hx   . Rectal cancer Neg Hx   . Stomach cancer Neg Hx     CURRENT MEDICATIONS:  Outpatient Encounter Medications as of 09/20/2017  Medication Sig  . ACETAMINOPHEN PO Take 650 mg by mouth every 8 (eight) hours as needed (for pain).   Marland Kitchen aspirin EC 81 MG tablet Take 162 mg by mouth at bedtime. Takes 30 minutes prior to Niacin  . cyanocobalamin 500 MCG tablet Take 500 mcg by mouth daily. Vitamin B-12  . HYDROcodone-acetaminophen (HYCET) 7.5-325 mg/15 ml solution Take 10-15 mLs by mouth 4 (four) times daily as needed for moderate pain.  Marland Kitchen lidocaine-prilocaine (EMLA) cream Apply a quarter size amount to  port site 1 hour prior to chemo. Do not rub in. Cover with plastic wrap.  . losartan (COZAAR) 25 MG tablet Take 25 mg by mouth daily.  . metoprolol succinate (TOPROL-XL) 25 MG 24 hr tablet Take 25 mg by mouth 2 (two) times daily.  . niacin (NIASPAN) 500 MG CR tablet Take 500 mg by mouth every evening.   Marland Kitchen omeprazole (PRILOSEC) 20 MG capsule Take 20 mg by mouth every evening.   . prochlorperazine (COMPAZINE) 10 MG tablet Take 1 tablet (10 mg total) by mouth every 6 (six) hours as needed for nausea or vomiting.  . simvastatin (ZOCOR) 20 MG tablet Take 20 mg by mouth every evening.   . sucralfate (CARAFATE) 1 g tablet Crush and mix in 1 oz of water q AC and HS.  . traMADol (ULTRAM) 50 MG tablet Take 1 tablet (50 mg total) by mouth 2 (two) times daily.   No facility-administered encounter medications on file as of 09/20/2017.     ALLERGIES:  Allergies  Allergen Reactions  . Morphine And Related     Caused hallucinations.  . Oxycodone     Caused hallucinations     PHYSICAL EXAM:  ECOG Performance status: 1  Vitals:   09/20/17 1547  BP: (!) 150/70  Pulse: 79  Resp: 16  Temp: 98.2 F (36.8 C)  SpO2: 97%   Filed Weights   09/20/17 1547  Weight: 180 lb 3.2 oz (81.7 kg)    Physical Exam   LABORATORY DATA:  I have reviewed the labs as listed.  CBC    Component Value Date/Time   WBC 6.4 09/20/2017 1503   RBC 3.64 (L) 09/20/2017 1503   HGB 12.1 (L) 09/20/2017 1503   HCT 37.2 (L) 09/20/2017 1503   PLT 171 09/20/2017 1503   MCV 102.2 (H) 09/20/2017 1503   MCH 33.2 09/20/2017 1503   MCHC 32.5 09/20/2017 1503   RDW 14.9 09/20/2017 1503   LYMPHSABS 1.1 09/20/2017 1503   MONOABS 1.0 09/20/2017 1503   EOSABS 0.3 09/20/2017 1503   BASOSABS 0.1 09/20/2017 1503   CMP Latest Ref Rng & Units 09/20/2017 08/24/2017 08/12/2017  Glucose 70 - 99 mg/dL 111(H) 164(H) 124(H)  BUN 8 - 23 mg/dL 16 20 22   Creatinine 0.61 - 1.24 mg/dL  0.87 0.87 0.88  Sodium 135 - 145 mmol/L 136 135 137   Potassium 3.5 - 5.1 mmol/L 3.9 3.8 3.9  Chloride 98 - 111 mmol/L 106 104 104  CO2 22 - 32 mmol/L 24 24 25   Calcium 8.9 - 10.3 mg/dL 9.1 9.0 9.2  Total Protein 6.5 - 8.1 g/dL 6.6 6.3(L) 6.8  Total Bilirubin 0.3 - 1.2 mg/dL 0.6 0.4 0.7  Alkaline Phos 38 - 126 U/L 125 67 88  AST 15 - 41 U/L 33 21 24  ALT 0 - 44 U/L 34 24 28       DIAGNOSTIC IMAGING:  I have personally reviewed images of the PET CT scan dated 09/16/2017 and reviewed the images with the patient and his daughters.     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 - Barium swallow done at [facility] showed a large mass in the distal esophagus. - PET/CT scan on 7/61/9509 hypermetabolic solid 3.5 x 2.3 cm esophageal mass in the mid to lower third of the thoracic esophagus with max SUV 7.3 and nonspecific mildly hypermetabolic nonenlarged bilateral hilar lymph nodes, indeterminate.  We discussed this with the patient in detail. - EGD on 05/26/2017 shows large fungating mass with no bleeding, 30 cm from incisors, extends to 35 cm, partially obstructing, biopsy consistent with squamous cell carcinoma. - Started on combination chemoradiation therapy with weekly carboplatin and paclitaxel on 06/27/2017.  He has developed worsening of difficulty swallowing.  He had a PEG tube placed on 07/05/2017 along with the Port-A-Cath on the same day.  He is taking and 1 can of Osmolite 1.5/day.  He is able to drink 3 ensures per day.  He is also eating soft foods. - He finished last cycle of chemotherapy on 08/02/2017 and radiation on 08/05/2017. - His swelling has gotten better.  He is taking in only 1 can of tube feeds per day.  He is able to eat most types of foods without any problems.  He gained some weight from last visit. -I have reviewed the results of the PET CT scan dated 09/16/2017  which showed multiple new hypermetabolic hepatic metastasis throughout the liver.  New hypermetabolic metastatic adenopathy in the upper abdomen.  There is also hypermetabolic hilar lymphadenopathy with new hypermetabolic right lower lobe pulmonary nodule.  Decrease in size and metabolic activity of the primary lesion in the distal esophagus. - This is a rather surprising findings on the PET CT scan.  I have recommended ultrasound-guided biopsy of 1 of the liver lesions particularly the lesion in the right hepatic lobe.  Highly likely possibility is metastatic squamous cell carcinoma.  However he also has a history of colorectal cancer.  We will schedule him for the biopsy and see him back 1 week after the biopsy.  2.  Colorectal cancer: - Underwent segmental colon resection in November 2016, found to have a T3N0 adenocarcinoma, with circumferential margins positive.  Patient did not receive any neoadjuvant or adjuvant chemotherapy/radiation therapy.  3.  Prostate cancer: -Originally diagnosed in early 2000s, status post seed implants.  Underwent cystoprostatectomy in November 2016, pathology showed prostatic adenocarcinoma Gleason 8 (4+4).  Total time spent is 25 minutes with more than 50% of the time spent face-to-face discussing scan results, prognosis, work-up options and coordination of care.    Orders placed this encounter:  Orders Placed This  Encounter  Procedures  . US BIOPSY (LIVER)      Derek Jack, MD [place] [phone]

## 2017-09-20 NOTE — Assessment & Plan Note (Signed)
1.  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 - Barium swallow done at [facility] showed a large mass in the distal esophagus. - PET/CT scan on 3/78/5885 hypermetabolic solid 3.5 x 2.3 cm esophageal mass in the mid to lower third of the thoracic esophagus with max SUV 7.3 and nonspecific mildly hypermetabolic nonenlarged bilateral hilar lymph nodes, indeterminate.  We discussed this with the patient in detail. - EGD on 05/26/2017 shows large fungating mass with no bleeding, 30 cm from incisors, extends to 35 cm, partially obstructing, biopsy consistent with squamous cell carcinoma. - Started on combination chemoradiation therapy with weekly carboplatin and paclitaxel on 06/27/2017.  He has developed worsening of difficulty swallowing.  He had a PEG tube placed on 07/05/2017 along with the Port-A-Cath on the same day.  He is taking and 1 can of Osmolite 1.5/day.  He is able to drink 3 ensures per day.  He is also eating soft foods. - He finished last cycle of chemotherapy on 08/02/2017 and radiation on 08/05/2017. - His swelling has gotten better.  He is taking in only 1 can of tube feeds per day.  He is able to eat most types of foods without any problems.  He gained some weight from last visit. -I have reviewed the results of the PET CT scan dated 09/16/2017 which showed multiple new hypermetabolic hepatic metastasis throughout the liver.  New hypermetabolic metastatic adenopathy in the upper abdomen.  There is also hypermetabolic hilar lymphadenopathy with new hypermetabolic right lower lobe pulmonary nodule.  Decrease in size and metabolic activity of the primary lesion in the distal esophagus. - This is a rather surprising findings on the PET CT scan.  I have recommended ultrasound-guided biopsy of 1 of the liver lesions particularly the lesion in the right hepatic lobe.  Highly likely possibility is metastatic squamous cell carcinoma.   However he also has a history of colorectal cancer.  We will schedule him for the biopsy and see him back 1 week after the biopsy.  2.  Colorectal cancer: - Underwent segmental colon resection in November 2016, found to have a T3N0 adenocarcinoma, with circumferential margins positive.  Patient did not receive any neoadjuvant or adjuvant chemotherapy/radiation therapy.  3.  Prostate cancer: -Originally diagnosed in early 2000s, status post seed implants.  Underwent cystoprostatectomy in November 2016, pathology showed prostatic adenocarcinoma Gleason 8 (4+4).

## 2017-09-20 NOTE — Patient Instructions (Signed)
[facility] Discharge Instructions     Thank you for choosing [facility] to provide your oncology and hematology care.  To afford each patient quality time with our provider, please arrive at least 15 minutes before your scheduled appointment time.   If you have a lab appointment with the Cancer Center please come in thru the  Main Entrance and check in at the main information desk  You need to re-schedule your appointment should you arrive 10 or more minutes late.  We strive to give you quality time with our providers, and arriving late affects you and other patients whose appointments are after yours.  Also, if you no show three or more times for appointments you may be dismissed from the clinic at the providers discretion.     Again, thank you for choosing [facility].  Our hope is that these requests will decrease the amount of time that you wait before being seen by our physicians.       _____________________________________________________________  Should you have questions after your visit to [facility], please contact our office at [phone] between the hours of 8:00 a.m. and 4:30 p.m.  Voicemails left after 4:00 p.m. will not be returned until the following business day.  For prescription refill requests, have your pharmacy contact our office and allow 72 hours.    Cancer Center Support Programs:   > Cancer Support Group  2nd Tuesday of the month 1pm-2pm, Journey Room    

## 2017-09-21 ENCOUNTER — Other Ambulatory Visit (HOSPITAL_COMMUNITY): Payer: Self-pay | Admitting: Nurse Practitioner

## 2017-09-21 DIAGNOSIS — G479 Sleep disorder, unspecified: Secondary | ICD-10-CM

## 2017-09-21 MED ORDER — ZOLPIDEM TARTRATE 10 MG PO TABS: 10.0000 mg | ORAL_TABLET | Freq: Every evening | ORAL | 0 refills | Status: DC | PRN

## 2017-09-23 DIAGNOSIS — C20 Malignant neoplasm of rectum: Secondary | ICD-10-CM | POA: Diagnosis not present

## 2017-09-23 DIAGNOSIS — Z933 Colostomy status: Secondary | ICD-10-CM | POA: Diagnosis not present

## 2017-09-23 DIAGNOSIS — Z936 Other artificial openings of urinary tract status: Secondary | ICD-10-CM | POA: Diagnosis not present

## 2017-09-26 ENCOUNTER — Telehealth (HOSPITAL_COMMUNITY): Payer: Self-pay

## 2017-09-26 ENCOUNTER — Other Ambulatory Visit (HOSPITAL_COMMUNITY): Payer: Self-pay | Admitting: Internal Medicine

## 2017-09-26 DIAGNOSIS — C155 Malignant neoplasm of lower third of esophagus: Secondary | ICD-10-CM

## 2017-09-26 DIAGNOSIS — Z8546 Personal history of malignant neoplasm of prostate: Secondary | ICD-10-CM | POA: Diagnosis not present

## 2017-09-26 MED ORDER — TRAMADOL HCL 50 MG PO TABS: 50.0000 mg | ORAL_TABLET | Freq: Four times a day (QID) | ORAL | 2 refills | Status: DC | PRN

## 2017-09-26 NOTE — Telephone Encounter (Signed)
Daughter called stating her dad is having a liver biopsy this Wednesday.  He is having lower back pain and below left shoulder pain when he sits.  He normally takes tylenol but it is not covering his pain and was wanting to know what other options he has for relief.  Instructed the daughter this would be reviewed and she will receive a phone call for further instructions.   Tele # [phone]

## 2017-09-26 NOTE — Telephone Encounter (Signed)
Reviewed phone call with Dr. Walden Field.  Oncologist stated she would review the chart and send something for pain.  Daughters telephone number and information given to Dr. Walden Field.

## 2017-09-26 NOTE — Telephone Encounter (Signed)
Called the daughter and notified Dr. Walden Field will be sending a prescription for Ultram.  Daughter verbalized understanding.

## 2017-09-27 ENCOUNTER — Other Ambulatory Visit: Payer: Self-pay | Admitting: Physician Assistant

## 2017-09-27 ENCOUNTER — Encounter: Payer: Self-pay | Admitting: Physician Assistant

## 2017-09-28 ENCOUNTER — Ambulatory Visit (HOSPITAL_COMMUNITY)
Admission: RE | Admit: 2017-09-28 | Discharge: 2017-09-28 | Disposition: A | Discharge status: Home / Self Care | Payer: PPO | Source: Ambulatory Visit | Attending: Nurse Practitioner | Admitting: Nurse Practitioner

## 2017-09-28 DIAGNOSIS — Z7982 Long term (current) use of aspirin: Secondary | ICD-10-CM | POA: Insufficient documentation

## 2017-09-28 DIAGNOSIS — K219 Gastro-esophageal reflux disease without esophagitis: Secondary | ICD-10-CM | POA: Diagnosis not present

## 2017-09-28 DIAGNOSIS — Z8546 Personal history of malignant neoplasm of prostate: Secondary | ICD-10-CM | POA: Insufficient documentation

## 2017-09-28 DIAGNOSIS — I10 Essential (primary) hypertension: Secondary | ICD-10-CM | POA: Insufficient documentation

## 2017-09-28 DIAGNOSIS — C787 Secondary malignant neoplasm of liver and intrahepatic bile duct: Secondary | ICD-10-CM | POA: Diagnosis not present

## 2017-09-28 DIAGNOSIS — R911 Solitary pulmonary nodule: Secondary | ICD-10-CM | POA: Insufficient documentation

## 2017-09-28 DIAGNOSIS — K769 Liver disease, unspecified: Secondary | ICD-10-CM | POA: Diagnosis present

## 2017-09-28 DIAGNOSIS — Z79899 Other long term (current) drug therapy: Secondary | ICD-10-CM | POA: Insufficient documentation

## 2017-09-28 DIAGNOSIS — Z85048 Personal history of other malignant neoplasm of rectum, rectosigmoid junction, and anus: Secondary | ICD-10-CM | POA: Diagnosis not present

## 2017-09-28 DIAGNOSIS — E785 Hyperlipidemia, unspecified: Secondary | ICD-10-CM | POA: Insufficient documentation

## 2017-09-28 DIAGNOSIS — Z885 Allergy status to narcotic agent status: Secondary | ICD-10-CM | POA: Insufficient documentation

## 2017-09-28 DIAGNOSIS — Z8042 Family history of malignant neoplasm of prostate: Secondary | ICD-10-CM | POA: Insufficient documentation

## 2017-09-28 DIAGNOSIS — Z800 Family history of malignant neoplasm of digestive organs: Secondary | ICD-10-CM | POA: Insufficient documentation

## 2017-09-28 DIAGNOSIS — C439 Malignant melanoma of skin, unspecified: Secondary | ICD-10-CM | POA: Diagnosis not present

## 2017-09-28 DIAGNOSIS — C155 Malignant neoplasm of lower third of esophagus: Secondary | ICD-10-CM | POA: Diagnosis not present

## 2017-09-28 DIAGNOSIS — K7689 Other specified diseases of liver: Secondary | ICD-10-CM | POA: Diagnosis not present

## 2017-09-28 DIAGNOSIS — C799 Secondary malignant neoplasm of unspecified site: Secondary | ICD-10-CM | POA: Diagnosis not present

## 2017-09-28 LAB — CBC
HCT: 39.7 % (ref 39.0–52.0)
Hemoglobin: 13.2 g/dL (ref 13.0–17.0)
MCH: 33.8 pg (ref 26.0–34.0)
MCHC: 33.2 g/dL (ref 30.0–36.0)
MCV: 101.8 fL — ABNORMAL HIGH (ref 78.0–100.0)
PLATELETS: 208 10*3/uL (ref 150–400)
RBC: 3.9 MIL/uL — AB (ref 4.22–5.81)
RDW: 14 % (ref 11.5–15.5)
WBC: 6.3 10*3/uL (ref 4.0–10.5)

## 2017-09-28 LAB — APTT: APTT: 27 s (ref 24–36)

## 2017-09-28 LAB — PROTIME-INR
INR: 0.92
PROTHROMBIN TIME: 12.3 s (ref 11.4–15.2)

## 2017-09-28 MED ORDER — SODIUM CHLORIDE 0.9 % IV SOLN
INTRAVENOUS | Status: DC
  Administered 2017-09-28: 12:00:00 via INTRAVENOUS

## 2017-09-28 MED ORDER — FENTANYL CITRATE (PF) 100 MCG/2ML IJ SOLN
INTRAMUSCULAR | Status: AC | PRN
  Administered 2017-09-28: 50 ug via INTRAVENOUS

## 2017-09-28 MED ORDER — LIDOCAINE HCL 1 % IJ SOLN
INTRAMUSCULAR | Status: AC
  Filled 2017-09-28: qty 20

## 2017-09-28 MED ORDER — FENTANYL CITRATE (PF) 100 MCG/2ML IJ SOLN
INTRAMUSCULAR | Status: AC
  Filled 2017-09-28: qty 2

## 2017-09-28 MED ORDER — HEPARIN SOD (PORK) LOCK FLUSH 100 UNIT/ML IV SOLN
500.0000 [IU] | Freq: Once | INTRAVENOUS | Status: AC
  Administered 2017-09-28: 500 [IU] via INTRAVENOUS
  Filled 2017-09-28: qty 5

## 2017-09-28 MED ORDER — MIDAZOLAM HCL 2 MG/2ML IJ SOLN
INTRAMUSCULAR | Status: AC
  Filled 2017-09-28: qty 2

## 2017-09-28 MED ORDER — LIDOCAINE HCL (PF) 1 % IJ SOLN
INTRAMUSCULAR | Status: AC | PRN
  Administered 2017-09-28: 10 mL

## 2017-09-28 MED ORDER — MIDAZOLAM HCL 2 MG/2ML IJ SOLN
INTRAMUSCULAR | Status: AC | PRN
  Administered 2017-09-28: 1 mg via INTRAVENOUS

## 2017-09-28 MED ORDER — GELATIN ABSORBABLE 12-7 MM EX MISC
CUTANEOUS | Status: AC
  Filled 2017-09-28: qty 1

## 2017-09-28 NOTE — Discharge Instructions (Signed)
Liver Biopsy, Care After °Refer to this sheet in the next few weeks. These instructions provide you with information on caring for yourself after your procedure. Your health care provider may also give you more specific instructions. Your treatment has been planned according to current medical practices, but problems sometimes occur. Call your health care provider if you have any problems or questions after your procedure. °What can I expect after the procedure? °After your procedure, it is typical to have the following: °· A small amount of discomfort in the area where the biopsy was done and in the right shoulder or shoulder blade. °· A small amount of bruising around the area where the biopsy was done and on the skin over the liver. °· Sleepiness and fatigue for the rest of the day. ° °Follow these instructions at home: °· Rest at home for 1-2 days or as directed by your health care provider. °· Have a friend or family member stay with you for at least 24 hours. °· Because of the medicines used during the procedure, you should not do the following things in the first 24 hours: °? Drive. °? Use machinery. °? Be responsible for the care of other people. °? Sign legal documents. °? Take a bath or shower. °· There are many different ways to close and cover an incision, including stitches, skin glue, and adhesive strips. Follow your health care provider's instructions on: °? Incision care. °? Bandage (dressing) changes and removal. °? Incision closure removal. °· Do not drink alcohol in the first week. °· Do not lift more than 5 pounds or play contact sports for 2 weeks after this test. °· Take medicines only as directed by your health care provider. Do not take medicine containing aspirin or non-steroidal anti-inflammatory medicines such as ibuprofen for 1 week after this test. °· It is your responsibility to get your test results. °Contact a health care provider if: °· You have increased bleeding from an incision  that results in more than a small spot of blood. °· You have redness, swelling, or increasing pain in any incisions. °· You notice a discharge or a bad smell coming from any of your incisions. °· You have a fever or chills. °Get help right away if: °· You develop swelling, bloating, or pain in your abdomen. °· You become dizzy or faint. °· You develop a rash. °· You are nauseous or vomit. °· You have difficulty breathing, feel short of breath, or feel faint. °· You develop chest pain. °· You have problems with your speech or vision. °· You have trouble balancing or moving your arms or legs. °This information is not intended to replace advice given to you by your health care provider. Make sure you discuss any questions you have with your health care provider. °Document Released: 07/24/2004 Document Revised: 06/12/2015 Document Reviewed: 03/02/2013 °Elsevier Interactive Patient Education © 2018 Elsevier Inc[employer] °Moderate Conscious Sedation, Adult, Care After °These instructions provide you with information about caring for yourself after your procedure. Your health care provider may also give you more specific instructions. Your treatment has been planned according to current medical practices, but problems sometimes occur. Call your health care provider if you have any problems or questions after your procedure. °What can I expect after the procedure? °After your procedure, it is common: °· To feel sleepy for several hours. °· To feel clumsy and have poor balance for several hours. °· To have poor judgment for several hours. °· To vomit if you eat   too soon. ° °Follow these instructions at home: °For at least 24 hours after the procedure: ° °· Do not: °? Participate in activities where you could fall or become injured. °? Drive. °? Use heavy machinery. °? Drink alcohol. °? Take sleeping pills or medicines that cause drowsiness. °? Make important decisions or sign legal documents. °? Take care of children on your  own. °· Rest. °Eating and drinking °· Follow the diet recommended by your health care provider. °· If you vomit: °? Drink water, juice, or soup when you can drink without vomiting. °? Make sure you have little or no nausea before eating solid foods. °General instructions °· Have a responsible adult stay with you until you are awake and alert. °· Take over-the-counter and prescription medicines only as told by your health care provider. °· If you smoke, do not smoke without supervision. °· Keep all follow-up visits as told by your health care provider. This is important. °Contact a health care provider if: °· You keep feeling nauseous or you keep vomiting. °· You feel light-headed. °· You develop a rash. °· You have a fever. °Get help right away if: °· You have trouble breathing. °This information is not intended to replace advice given to you by your health care provider. Make sure you discuss any questions you have with your health care provider. °Document Released: 10/25/2012 Document Revised: 06/09/2015 Document Reviewed: 04/26/2015 °Elsevier Interactive Patient Education © 2018 Elsevier Inc. ° °

## 2017-09-28 NOTE — Procedures (Signed)
Interventional Radiology Procedure Note  Procedure: US guided biopsy of hepatic lesion.   Complications: None  Estimated Blood Loss: None  Recommendations: - Bedrest x 3 hrs - DC home  Signed,  Olesya Wike K. Bradie Lacock, MD    

## 2017-09-28 NOTE — H&P (Signed)
Chief Complaint: Patient was seen in consultation today for image guided liver lesion biopsy  Referring Physician(s): Katragadda,S  Supervising Physician: Jacqulynn Cadet  Patient Status: [facility] - Out-pt  History of Present Illness: Ian Christensen is a 84 y.o. male with history of squamous cell carcinoma of the esophagus, colon cancer as well as prostate cancer.  PET scan on 09/16/17 has revealed multiple new hypermetabolic liver lesions, upper abdominal metastatic adenopathy, hilar adenopathy, and right lower lobe pulmonary nodule.  He presents today for image guided liver lesion biopsy for further evaluation.  Past Medical History:  Diagnosis Date  . Arthritis   . Colostomy in place [facility])   . Esophageal cancer ([place])   . GERD (gastroesophageal reflux disease)   . History of colon polyps   . History of hiatal hernia   . 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 HTN (hypertension)    followed by pcp  . Hyperlipidemia   . Presence of urostomy ([place])   . Rectal cancer ([place]) 10/07/2014   no chemo or radiation/ bladder was removed also  . Tinnitus    per pt intermittant   . Wears glasses     Past Surgical History:  Procedure Laterality Date  . APPENDECTOMY  1955  . COLONOSCOPY    . CYSTO N/A 12/11/2014   Procedure:  CYSTO,  PROSTATECTOMY, CYSTECTOMY colon conduit, BILATERAL LYMPH NODE DISSECTION;  Surgeon: Alexis Frock, MD;  Location: [facility];  Service: Urology;  Laterality: N/A;  . ESOPHAGOGASTRODUODENOSCOPY (EGD) WITH PROPOFOL N/A 07/05/2017   Procedure: ESOPHAGOGASTRODUODENOSCOPY (EGD) WITH PROPOFOL;  Surgeon: Aviva Signs, MD;  Location: [facility];  Service: General;  Laterality: N/A;  . EUS N/A 10/10/2014   Procedure: LOWER ENDOSCOPIC ULTRASOUND (EUS);  Surgeon: Milus Banister, MD;  Location: [facility];  Service: Endoscopy;  Laterality: N/A;  . HERNIA REPAIR      . INCISIONAL HERNIA REPAIR N/A 03/02/2017   Procedure: INCISIONAL HERNIA REPAIR WITH MESH;  Surgeon: Leighton Ruff, MD;  Location: [facility];  Service: General;  Laterality: N/A;  . INSERTION OF MESH  03/02/2017   Procedure: INSERTION OF MESH;  Surgeon: Leighton Ruff, MD;  Location: [facility];  Service: General;;  . LAPAROSCOPIC INTERNAL HERNIA REPAIR N/A 03/06/2017   Procedure: REOPENING OF RECENT LAPAROTOMY WITH CLOSURE OF INCISIONAL HERNIA WITH MESH;  Surgeon: Leighton Ruff, MD;  Location: [facility];  Service: General;  Laterality: N/A;  . PEG PLACEMENT N/A 07/05/2017   Procedure: PERCUTANEOUS ENDOSCOPIC GASTROSTOMY (PEG) PLACEMENT;  Surgeon: Aviva Signs, MD;  Location: [facility];  Service: General;  Laterality: N/A;  . PORTACATH PLACEMENT Left 07/05/2017   Procedure: INSERTION PORT WITH ATTACHED CATHETER IN LEFT SUBCLAVIAN;  Surgeon: Aviva Signs, MD;  Location: [facility];  Service: General;  Laterality: Left;  . RADIOACTIVE PROSTATE SEED IMPLANTS  02-06-1999  dr Jeffie Pollock  [facility]  . XI ROBOTIC ASSISTED LOWER ANTERIOR RESECTION N/A 12/11/2014   Procedure: XI ROBOTIC ASSISTED ABDOMINAL PERINEAL RESECTION OMENTOPLASTY;  Surgeon: Leighton Ruff, MD;  Location: [facility];  Service: General;  Laterality: N/A;    Allergies: Morphine and related and Oxycodone  Medications: Prior to Admission medications   Medication Sig Start Date End Date Taking? Authorizing Provider  ACETAMINOPHEN PO Take 650 mg by mouth every 8 (eight) hours as needed (for pain).     [provider]  aspirin EC 81 MG tablet Take 162 mg  by mouth at bedtime. Takes 30 minutes prior to Niacin    [provider]  cyanocobalamin 500 MCG tablet Take 500 mcg by mouth daily. Vitamin B-12    [provider]  HYDROcodone-acetaminophen (HYCET) 7.5-325 mg/15 ml solution Take 10-15 mLs by mouth 4 (four) times daily as needed for moderate pain. 07/04/17   Hayden Pedro, PA-C  lidocaine-prilocaine (EMLA) cream Apply a quarter  size amount to port site 1 hour prior to chemo. Do not rub in. Cover with plastic wrap. 06/24/17   Derek Jack, MD  losartan (COZAAR) 25 MG tablet Take 25 mg by mouth daily.    [provider]  metoprolol succinate (TOPROL-XL) 25 MG 24 hr tablet Take 25 mg by mouth 2 (two) times daily.    [provider]  niacin (NIASPAN) 500 MG CR tablet Take 500 mg by mouth every evening.  01/29/17   [provider]  omeprazole (PRILOSEC) 20 MG capsule Take 20 mg by mouth every evening.  07/23/14   [provider]  prochlorperazine (COMPAZINE) 10 MG tablet Take 1 tablet (10 mg total) by mouth every 6 (six) hours as needed for nausea or vomiting. 06/24/17   Derek Jack, MD  simvastatin (ZOCOR) 20 MG tablet Take 20 mg by mouth every evening.  12/29/15   [provider]  sucralfate (CARAFATE) 1 g tablet Crush and mix in 1 oz of water q AC and HS. 07/05/17   Hayden Pedro, PA-C  traMADol (ULTRAM) 50 MG tablet Take 1 tablet (50 mg total) by mouth every 6 (six) hours as needed. 09/26/17   Higgs, Mathis Dad, MD  zolpidem (AMBIEN) 10 MG tablet Take 1 tablet (10 mg total) by mouth at bedtime as needed for sleep. 09/21/17 10/21/17  Glennie Isle, NP-C     Family History  Problem Relation Age of Onset  . Diabetes Mother   . Heart disease Mother   . Heart disease Father   . Colon cancer Sister   . Prostate cancer Brother   . Esophageal cancer Neg Hx   . Rectal cancer Neg Hx   . Stomach cancer Neg Hx     Social History   Socioeconomic History  . Marital status: Widowed    Spouse name: Not on file  . Number of children: 2  . Years of education: Not on file  . Highest education level: Not on file  Occupational History  . Occupation: retired  Scientific laboratory technician  . Financial resource strain: Not very hard  . Food insecurity:    Worry: Never true    Inability: Never true  . Transportation needs:    Medical: No    Non-medical: No  Tobacco Use  . Smoking  status: Never Smoker  . Smokeless tobacco: Never Used  Substance and Sexual Activity  . Alcohol use: Yes    Alcohol/week: 0.0 standard drinks    Comment: seldom  . Drug use: No  . Sexual activity: Not Currently    Comment: Widowed  Lifestyle  . Physical activity:    Days per week: 1 day    Minutes per session: 30 min  . Stress: Not at all  Relationships  . Social connections:    Talks on phone: Three times a week    Gets together: Three times a week    Attends religious service: More than 4 times per year    Active member of club or organization: Yes    Attends meetings of clubs or organizations: More than  4 times per year    Relationship status: Widowed  Other Topics Concern  . Not on file  Social History Narrative  . Not on file      Review of Systems he currently denies fever, headache, dyspnea, cough, abdominal pain, nausea, vomiting or bleeding.  Does have some occasional anterior chest/epigastric discomfort as well as low back pain.  Vital Signs: Blood pressure 154/76, heart rate 75, respirations 16, temp 97.8, O2 sats 99% room air    Physical Exam awake, alert.  Chest clear to auscultation bilaterally.  Clean, intact left chest wall Port-A-Cath.  Heart with regular rate and rhythm.  Abdomen soft, positive bowel sounds, intact gastrostomy tube, urostomy and colostomy.  Imaging: Nm Pet Image Restag (ps) Skull Base To Thigh  Result Date: 09/16/2017 CLINICAL DATA:  Subsequent treatment strategy for esophageal carcinoma. Chemo radiation therapy complete. EXAM: NUCLEAR MEDICINE PET SKULL BASE TO THIGH TECHNIQUE: 9.0 mCi F-18 FDG was injected intravenously. Full-ring PET imaging was performed from the skull base to thigh after the radiotracer. CT data was obtained and used for attenuation correction and anatomic localization. Fasting blood glucose: 94 mg/dl COMPARISON:  None PET-CT 05/16/2017, 03/05/2017 FINDINGS: Mediastinal blood pool activity: SUV max 2.2 NECK: Is  Incidental CT findings: none CHEST: Mild activity through the esophagus several cm below the carina SUV max equal 4.9 compared with 6.5 on prior PET-CT scan. Persistent hypermetabolic bilateral hilar lymph nodes with SUV max equal 4.6 . Within the RIGHT lower lobe, there is a new 17 mm semi-solid nodule abutting the pleural surface (image 47/8) SUV max equal 5.7. Incidental CT findings: none ABDOMEN/PELVIS: Unfortunately, there are multiple new hypermetabolic lesions within the LEFT and RIGHT hepatic lobe. These hypermetabolic lesions correlate withwell-defined discrete low-density lesions on comparison noncontrast CT. Example lesion the RIGHT hepatic lobe with SUV max equal 6.0 measures 2.5 cm (image 113/14). Example lesion in the LEFT hepatic lobe SUV max equal 7.6 measures 1.4 cm Approximately 30 lesions in the liver ranging size from 1 cm up to 4 cm. New hypermetabolic lymph nodes in the gastrohepatic ligament. For example 13 mm node with SUV max equal 12.3 on image 109/4. A similar node adjacent to the pancreas on image 114/4. Several small metastatic lymph nodes anterior to the aorta at the level of the SMA are intensely hypermetabolic for size with SUV max equal 6.3. No metastatic adenopathy in the pelvis. Incidental CT findings: None SKELETON: No focal hypermetabolic activity to suggest skeletal metastasis. Incidental CT findings: none IMPRESSION: 1. Unfortunately, multiple new hypermetabolic hepatic metastasis throughout the liver. 2. New hypermetabolic metastatic adenopathy in the upper abdomen. 3. Persistent hypermetabolic hilar lymph nodes (reactive adenopathy versus metastatic adenopathy) 4. New hypermetabolic RIGHT lower lobe pulmonary nodule with differential including small focus infection versus pulmonary metastasis. 5. Decrease in size and metabolic activity of the primary lesion in distal esophagus. These results will be called to the ordering clinician or representative by the Radiologist  Assistant, and communication documented in the PACS or zVision Dashboard. Electronically Signed   By: Suzy Bouchard M.D.   On: 09/16/2017 15:49    Labs:  CBC: Recent Labs    08/02/17 0806 08/12/17 1115 08/24/17 1251 09/20/17 1503  WBC 3.8* 3.5* 5.2 6.4  HGB 13.0 12.4* 11.7* 12.1*  HCT 39.2 37.3* 35.5* 37.2*  PLT 143* 162 188 171    COAGS: No results for input(s): INR, APTT in the last 8760 hours.  BMP: Recent Labs    08/02/17 0806 08/12/17 1115 08/24/17 1251  09/20/17 1503  NA 139 137 135 136  K 4.0 3.9 3.8 3.9  CL 106 104 104 106  CO2 24 25 24 24   GLUCOSE 120* 124* 164* 111*  BUN 26* 22 20 16   CALCIUM 9.1 9.2 9.0 9.1  CREATININE 0.86 0.88 0.87 0.87  GFRNONAA >60 >60 >60 >60  GFRAA >60 >60 >60 >60    LIVER FUNCTION TESTS: Recent Labs    08/02/17 0806 08/12/17 1115 08/24/17 1251 09/20/17 1503  BILITOT 0.6 0.7 0.4 0.6  AST 18 24 21  33  ALT 16 28 24  34  ALKPHOS 53 88 67 125  PROT 6.6 6.8 6.3* 6.6  ALBUMIN 3.6 3.6 3.5 3.6    TUMOR MARKERS: No results for input(s): AFPTM, CEA, CA199, CHROMGRNA in the last 8760 hours.  Assessment and Plan:  84 y.o. male with history of squamous cell carcinoma of the esophagus, colon cancer as well as prostate cancer.  PET scan on 09/16/17 has revealed multiple new hypermetabolic liver lesions, upper abdominal metastatic adenopathy, hilar adenopathy, and right lower lobe pulmonary nodule.  He presents today for image guided liver lesion biopsy for further evaluation.Risks and benefits discussed with the patient including, but not limited to bleeding, infection, damage to adjacent structures or low yield requiring additional tests.  All of the patient's questions were answered, patient is agreeable to proceed. Consent signed and in chart.  LABS PENDING   Thank you for this interesting consult.  I greatly enjoyed meeting Ian Christensen and look forward to participating in their care.  A copy of this report was sent to the  requesting provider on this date.  Electronically Signed: D. Rowe Robert, PA-C 09/28/2017, 11:21 AM   I spent a total of  25 minutes   in face to face in clinical consultation, greater than 50% of which was counseling/coordinating care for image guided liver lesion biopsy

## 2017-09-30 ENCOUNTER — Ambulatory Visit (INDEPENDENT_AMBULATORY_CARE_PROVIDER_SITE_OTHER): Payer: PPO | Admitting: Urology

## 2017-09-30 DIAGNOSIS — Z8546 Personal history of malignant neoplasm of prostate: Secondary | ICD-10-CM

## 2017-09-30 DIAGNOSIS — C778 Secondary and unspecified malignant neoplasm of lymph nodes of multiple regions: Secondary | ICD-10-CM

## 2017-09-30 DIAGNOSIS — C787 Secondary malignant neoplasm of liver and intrahepatic bile duct: Secondary | ICD-10-CM

## 2017-10-03 ENCOUNTER — Ambulatory Visit: Payer: Self-pay | Admitting: Radiation Oncology

## 2017-10-04 ENCOUNTER — Inpatient Hospital Stay (HOSPITAL_BASED_OUTPATIENT_CLINIC_OR_DEPARTMENT_OTHER): Payer: PPO | Admitting: Internal Medicine

## 2017-10-04 ENCOUNTER — Encounter (HOSPITAL_COMMUNITY): Payer: Self-pay | Admitting: Internal Medicine

## 2017-10-04 VITALS — BP 120/73 | HR 82 | Temp 98.3°F | Resp 16 | Wt 176.2 lb

## 2017-10-04 DIAGNOSIS — I10 Essential (primary) hypertension: Secondary | ICD-10-CM

## 2017-10-04 DIAGNOSIS — Z85048 Personal history of other malignant neoplasm of rectum, rectosigmoid junction, and anus: Secondary | ICD-10-CM | POA: Diagnosis not present

## 2017-10-04 DIAGNOSIS — C155 Malignant neoplasm of lower third of esophagus: Secondary | ICD-10-CM | POA: Diagnosis not present

## 2017-10-04 DIAGNOSIS — Z8546 Personal history of malignant neoplasm of prostate: Secondary | ICD-10-CM | POA: Diagnosis not present

## 2017-10-04 DIAGNOSIS — C787 Secondary malignant neoplasm of liver and intrahepatic bile duct: Secondary | ICD-10-CM | POA: Diagnosis not present

## 2017-10-04 DIAGNOSIS — C439 Malignant melanoma of skin, unspecified: Secondary | ICD-10-CM | POA: Diagnosis not present

## 2017-10-04 DIAGNOSIS — R5383 Other fatigue: Secondary | ICD-10-CM | POA: Diagnosis not present

## 2017-10-04 NOTE — Patient Instructions (Addendum)
[place] at [facility] Discharge Instructions   You were seen today by Dr. Zoila Shutter. She went over your current issues with pain. She discussed your recent liver biopsy, showing Melanoma.  She also discussed Melanoma and treatments, immunotherapy, and further screening for mutations.  We will send your biopsy off for more testing. This will help [place] determine the treatment you are eligible for. Try taking the Tramadol every 4 hours for pain instead of every 6 hours. Make sure to eat a little before taking this pain medication.  We will see you back in 2 weeks for follow up with Dr. Delton Coombes after we get the results back from the mutation testing.   Thank you for choosing [place] at [facility] to provide your oncology and hematology care.  To afford each patient quality time with our provider, please arrive at least 15 minutes before your scheduled appointment time.    If you have a lab appointment with the [facility] please come in thru the  Main Entrance and check in at the main information desk  You need to re-schedule your appointment should you arrive 10 or more minutes late.  We strive to give you quality time with our providers, and arriving late affects you and other patients whose appointments are after yours.  Also, if you no show three or more times for appointments you may be dismissed from the clinic at the providers discretion.     Again, thank you for choosing [facility].  Our hope is that these requests will decrease the amount of time that you wait before being seen by our physicians.       _____________________________________________________________  Should you have questions after your visit to [facility], please contact our office at [phone] between the hours of 8:30 a.m. and 4:30 p.m.  Voicemails left after 4:30 p.m. will not be returned until the following business day.  For  prescription refill requests, have your pharmacy contact our office.       Resources For Cancer Patients and their Caregivers ? American Cancer Society: Can assist with transportation, wigs, general needs, runs Look Good Feel Better.        ([phone] ? Cancer Care: Provides financial assistance, online support groups, medication/co-pay assistance.  1-800-813-HOPE 403-470-5955) ? [place] Assists [place] cancer patients and their families through emotional , educational and financial support.  (956) 350-4302 ? [place] DSS Where to apply for food stamps, Medicaid and utility assistance. 443-200-7669 ? [facility]: Transportation to medical appointments. 610-874-4714 ? Social Security Administration: May apply for disability if have a Stage IV cancer. 7026355982 207-316-4455 ? [employer], Disability and Transit Services: Assists with nutrition, care and transit needs. [place] Support Programs:   > Cancer Support Group  2nd Tuesday of the month 1pm-2pm, Journey Room   > Creative Journey  3rd Tuesday of the month 1130am-1pm, Journey Room

## 2017-10-04 NOTE — Progress Notes (Signed)
Diagnosis No diagnosis found.  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 - Barium swallow done at [facility] showed a large mass in the distal esophagus. - PET/CT scan on 02/06/4172 hypermetabolic solid 3.5 x 2.3 cm esophageal mass in the mid to lower third of the thoracic esophagus with max SUV 7.3 and nonspecific mildly hypermetabolic nonenlarged bilateral hilar lymph nodes, indeterminate.  We discussed this with the patient in detail. - EGD on 05/26/2017 shows large fungating mass with no bleeding, 30 cm from incisors, extends to 35 cm, partially obstructing, biopsy consistent with squamous cell carcinoma. - Started on combination chemoradiation therapy with weekly carboplatin and paclitaxel on 06/27/2017.  He has developed worsening of difficulty swallowing.  He had a PEG tube placed on 07/05/2017 along with the Port-A-Cath on the same day.  He is taking 1 can of Osmolite 1.5/day.  He is able to drink 3 ensures per day.  He is also eating soft foods. - He finished last cycle of chemotherapy on 08/02/2017 and radiation on 08/05/2017.  Scan done 8/209 showed new findings concerning for mets.  Pt had liver biopsy done 09/28/2017 that returned as melanoma.    2.  Melanoma.   PET CT scan that was done 09/16/2017 showed multiple new hypermetabolic hepatic metastasis throughout the liver.  New hypermetabolic metastatic adenopathy in the upper abdomen.  There is also hypermetabolic hilar lymphadenopathy with new hypermetabolic right lower lobe pulmonary nodule.  Decrease in size and metabolic activity of the primary lesion in the distal  esophagus. - Pt was recommended ultrasound-guided biopsy of liver that was done 09/28/2017 with pathology returning as metastatic melanoma.    Discussion held with pt and family today regarding pathology results.  He denies any prior history of melanoma.  He has undergone biopsies of skin lesions in the past with Dr. Nevada Crane but was never told any of the lesions were melanoma.  He has several moles noted on skin of back and chest area.    I have discussed with them that specimen will be sent for molecular testing for BRAF, PDL1  And foundation testing.  I discussed with them usually advanced melanoma is treated with immunotherapy and PD1 therapy.  Combination therapy usually has side effects that may be considerable.  He is also recommended for MRI of brain to complete staging evaluation.    Pt does not desire to have MRI of brain.  He also reports he may choose not to have therapy.  He will follow-up with Dr. Worthy Keeler to discuss in detail treatment options once molecular testing has been reviewed.  I have provided them written information regarding melanoma treatment options and path from recent liver biopsy.  All questions answered and pt and family expressed understanding of information presented.  Due to various cancers, pt may have option for genetic testing if pt desires once he is seen for follow-up.   . 3.  Colorectal cancer: - Underwent segmental colon resection in November 2016, found to have a T3N0 adenocarcinoma, with circumferential margins positive.  Patient did not receive any neoadjuvant or adjuvant chemotherapy/radiation therapy.  4.  Prostate cancer: -Originally diagnosed in early 2000s,  status post seed implants.  Underwent cystoprostatectomy in November 2016, pathology showed prostatic adenocarcinoma Gleason 8 (4+4).  5.  HTN.  BP is 120/73.  Follow-up with PCP.    6.  Fatigue.  Recent HB on labs done 09/2017 was 13.  Likely due to malignancy.    30  Minutes spent with  with more  than 50% of the time spent in counseling and coordination of care.    Current Status:  Pt is seen today for follow-up.  He is here to go over recent liver biopsy.  He is accompanied by family members.       Prostate cancer ([place])   12/11/2014 Procedure    Prostatectomy at time of rectal ca resection    12/17/2014 Pathology Results    PROSTATE: PROSTATIC ADENOCARCINOMA WITHOUT TREATMENT EFFECT, GLEASON SCORE 4+4=8 THE TUMOR LOCATED AT THE LEFT ANTERIOR APEX AND THE MID MARGIN OF RESECTION ARE POSITIVE AT THE LEFT ANTERIOR APEX (0.4 CM, GLEASON SCORE 4+4=8) OTHER MARGINS ARE NEGATIVE FOR TUMOR     Rectal cancer ([place])   10/02/2014 Procedure    Colonoscopy by Dr. Carlean Purl- One-third circumferential medium mass was found in the distal rectum; ulcerated, firm anterior over prostate bed, multiple biopsies were performed using cold forceps- suspect carcinoma ? rectal vs recurrence of prostate    10/04/2014 Pathology Results    Rectum, biopsy ulcerated mass-invasive adenocarcinoma consistent with colorectal origin.  CDX 2 positive.  PSA negative.    10/07/2014 Initial Diagnosis    Rectal cancer ([place])    10/08/2014 Imaging    CT CAP- 1. Wall thickening of the rectum compatible with recently diagnosed rectal carcinoma. Tiny subcentimeter perirectal lymph nodes. 2. Multiple bilateral pulmonary nodules as above. While these may be infectious or inflammatory in etiology, metastatic disease in the setting of known rectal carcinoma is not excluded. 3. Nodular soft tissue within the left mainstem bronchus which is nonspecific and may represent mucus however endobronchial nodule is not excluded. Recommend either attention on followup or correlation with bronchoscopy if the patient is having respiratory symptoms. 4. No evidence for focal hepatic lesion. 5. Bilateral low-attenuation renal lesions with internal densities greater than that of fluid, indeterminate. Recommend attention on followup or  definitive characterization with pre and post contrast-enhanced MRI.    10/10/2014 Procedure    EUS by Dr. Ardis Hughs- Non-circumferential, 3.4cm [phone] (Stage IIIB) rectal adenocarcinoma along the anterior wall of the distal rectum with distal edge 1-2cm from the anal verge.    12/11/2014 Procedure    Segmental resection for tumor by Dr. Leighton Ruff    19/16/6060 Pathology Results    Colon, segmental resection for tumor, Bladder and Prostate COLON: INVASIVE COLONIC ADENOCARCINOMA (3.5 CM IN GREATEST DIMENSION) THE CARCINOMA INVASIVE THROUGH THE MUSCULARIS PROPRIA AND PRESENTS AT THE ANTERIOR MESORECTUM RESECTION MARGIN (1.5 CM, R2) THE PROXIMAL AND DISTAL RESECTION MARGINS ARE NEGATIVE FOR TUMOR LYMPHOVASCULAR INVASION IS IDENTIFED ELEVEN BENIGN LYMPH NODES (0/11) PROSTATE: PROSTATIC ADENOCARCINOMA WITHOUT TREATMENT EFFECT, GLEASON SCORE 4+4=8 THE TUMOR LOCATED AT THE LEFT ANTERIOR APEX AND THE MID MARGIN OF RESECTION ARE POSITIVE AT THE LEFT ANTERIOR APEX (0.4 CM, GLEASON SCORE 4+4=8) OTHER MARGINS ARE NEGATIVE FOR TUMOR BLADDER: BENIGN UROTHELIUM AND SUBCUTANOUS TISSUE NEGATIVE FOR MALIGNANCY    06/20/2015 Imaging    CT CAP- 1. Interval cystoprostatectomy, urinary diversion, abdominal perineal resection and sigmoid colostomy. 2. No evidence of abdominal pelvic metastatic disease. 3. Stable small pulmonary nodules bilaterally. Based on stability over nearly 9 months, these are likely benign. Attention  on continued follow-up recommended. 4. Stable low-density renal lesions bilaterally. 5. Moderate atherosclerosis.    12/31/2015 Procedure    Colonoscopy by Dr. Heron Sabins and colostomy in the sigmoid colon.  Diverticulosis in the left colon, examination otherwise normal.    06/21/2016 Imaging    CT CAP- Stable exam. No definite evidence of recurrent or metastatic carcinoma.  Stable sub-cm noncalcified bilateral pulmonary nodules, most consistent with benign postinflammatory  etiology. Recommend continued attention on follow-up imaging.  Cholelithiasis. No radiographic evidence of cholecystitis.  Aortic and coronary artery atherosclerosis.     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)    06/26/2017 -  Chemotherapy    The patient had palonosetron (ALOXI) injection 0.25 mg, 0.25 mg, Intravenous,  Once, 1 of 1 cycle Administration: 0.25 mg (06/27/2017), 0.25 mg (07/05/2017), 0.25 mg (07/12/2017), 0.25 mg (07/19/2017), 0.25 mg (07/26/2017), 0.25 mg (08/02/2017) CARBOplatin (PARAPLATIN) 150 mg in sodium chloride 0.9 % 100 mL chemo infusion, 150 mg (100 % of original dose 150 mg), Intravenous,  Once, 1 of 1 cycle Dose modification: 150 mg (original dose 150 mg, Cycle 1, Reason: Provider Judgment), 125 mg (original dose 125 mg, Cycle 1, Reason: Provider Judgment),   (original dose 150 mg, Cycle 1), 150 mg (original dose 150 mg, Cycle 1, Reason: Provider Judgment), 150 mg (original dose 150 mg, Cycle 1, Reason: Provider Judgment), 150 mg (original dose 150 mg, Cycle 1, Reason: Provider Judgment), 150 mg (original dose 150 mg, Cycle 1, Reason: Provider Judgment) Administration: 150 mg (06/27/2017), 130 mg (07/05/2017), 150 mg (07/12/2017), 150 mg (07/19/2017), 150 mg (07/26/2017), 150 mg (08/02/2017) PACLitaxel (TAXOL) 78 mg in sodium chloride 0.9 % 250 mL chemo infusion (</= 93m/m2), 40 mg/m2 = 78 mg (80 % of original dose 50 mg/m2), Intravenous,  Once, 1 of 1 cycle Dose modification: 40 mg/m2 (80 % of original dose 50 mg/m2, Cycle 1, Reason: Provider Judgment), 40 mg/m2 (80 % of original dose 50 mg/m2, Cycle 1, Reason: Provider Judgment) Administration: 78 mg (06/27/2017), 78 mg (07/05/2017), 78 mg (07/12/2017), 78 mg (07/19/2017), 78 mg (07/26/2017), 78 mg (08/02/2017)  for chemotherapy treatment.       Problem List Patient Active Problem List   Diagnosis Date Noted   . Esophageal dysphagia [R13.10]   . 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 ([place] [C15.5] 06/02/2017  . SBO (small bowel obstruction) ([place] [K56.609] 03/05/2017  . Incisional hernia of anterior abdominal wall without obstruction or gangrene [K43.2] 03/02/2017  . Pulmonary nodules/lesions, multiple [R91.8] 10/17/2014  . Bilateral renal masses [N28.89] 10/17/2014  . Rectal cancer ([place] [C20] 10/07/2014  . Hypertension [I10] 11/17/2011  . High cholesterol [E78.00] 11/17/2011  . Prostate cancer ([place] [C61] 11/17/2011  . Colon polyps [K63.5] 11/17/2011    Past Medical History Past Medical History:  Diagnosis Date  . Arthritis   . Colostomy in place ([facility]   . Esophageal cancer ([place]   . GERD (gastroesophageal reflux disease)   . History of colon polyps   . History of hiatal hernia   . 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 HTN (hypertension)    followed by pcp  . Hyperlipidemia   . Presence of urostomy ([place]   . Rectal cancer ([place] 10/07/2014   no chemo or radiation/ bladder  was removed also  . Tinnitus    per pt intermittant   . Wears glasses     Past Surgical History Past Surgical History:  Procedure Laterality Date  . APPENDECTOMY  1955  . COLONOSCOPY    . CYSTO N/A 12/11/2014   Procedure:  CYSTO,  PROSTATECTOMY, CYSTECTOMY colon conduit, BILATERAL LYMPH NODE DISSECTION;  Surgeon: Alexis Frock, MD;  Location: [facility];  Service: Urology;  Laterality: N/A;  . ESOPHAGOGASTRODUODENOSCOPY (EGD) WITH PROPOFOL N/A 07/05/2017   Procedure: ESOPHAGOGASTRODUODENOSCOPY (EGD) WITH PROPOFOL;  Surgeon: Aviva Signs, MD;  Location: [facility];  Service: General;  Laterality: N/A;  . EUS N/A 10/10/2014   Procedure: LOWER ENDOSCOPIC ULTRASOUND (EUS);  Surgeon: Milus Banister, MD;  Location: [facility];  Service: Endoscopy;  Laterality: N/A;  . HERNIA REPAIR     . INCISIONAL HERNIA REPAIR N/A 03/02/2017   Procedure: INCISIONAL HERNIA REPAIR WITH MESH;  Surgeon: Leighton Ruff, MD;  [facility]: [facility];  Service: General;  Laterality: N/A;  . INSERTION OF MESH  03/02/2017   Procedure: INSERTION OF MESH;  Surgeon: Leighton Ruff, MD;  [facility]: [facility];  Service: General;;  . LAPAROSCOPIC INTERNAL HERNIA REPAIR N/A 03/06/2017   Procedure: REOPENING OF RECENT LAPAROTOMY WITH CLOSURE OF INCISIONAL HERNIA WITH MESH;  Surgeon: Leighton Ruff, MD;  Location: [facility];  Service: General;  Laterality: N/A;  . PEG PLACEMENT N/A 07/05/2017   Procedure: PERCUTANEOUS ENDOSCOPIC GASTROSTOMY (PEG) PLACEMENT;  Surgeon: Aviva Signs, MD;  Location: [facility];  Service: General;  Laterality: N/A;  . PORTACATH PLACEMENT Left 07/05/2017   Procedure: INSERTION PORT WITH ATTACHED CATHETER IN LEFT SUBCLAVIAN;  Surgeon: Aviva Signs, MD;  Location: [facility];  Service: General;  Laterality: Left;  . RADIOACTIVE PROSTATE SEED IMPLANTS  02-06-1999  dr Jeffie Pollock  [facility]  . XI ROBOTIC ASSISTED LOWER ANTERIOR RESECTION N/A 12/11/2014   Procedure: XI ROBOTIC ASSISTED ABDOMINAL PERINEAL RESECTION OMENTOPLASTY;  Surgeon: Leighton Ruff, MD;  Location: [facility];  Service: General;  Laterality: N/A;    Family History Family History  Problem Relation Age of Onset  . Diabetes Mother   . Heart disease Mother   . Heart disease Father   . Colon cancer Sister   . Prostate cancer Brother   . Esophageal cancer Neg Hx   . Rectal cancer Neg Hx   . Stomach cancer Neg Hx      Social History  reports that he has never smoked. He has never used smokeless tobacco. He reports that he drinks alcohol. He reports that he does not use drugs.  Medications  Current Outpatient Medications:  .  ACETAMINOPHEN PO, Take 650 mg by mouth every 8 (eight) hours as needed (for pain). , Disp: , Rfl:  .  aspirin EC 81 MG tablet, Take 162 mg by mouth at bedtime. Takes 30 minutes prior to Niacin, Disp: , Rfl:  .  cyanocobalamin  500 MCG tablet, Take 500 mcg by mouth daily. Vitamin B-12, Disp: , Rfl:  .  HYDROcodone-acetaminophen (HYCET) 7.5-325 mg/15 ml solution, Take 10-15 mLs by mouth 4 (four) times daily as needed for moderate pain., Disp: 473 mL, Rfl: 0 .  lidocaine-prilocaine (EMLA) cream, Apply a quarter size amount to port site 1 hour prior to chemo. Do not rub in. Cover with plastic wrap., Disp: 30 g, Rfl: 3 .  losartan (COZAAR) 25 MG tablet, Take 25 mg by mouth daily., Disp: , Rfl:  .  metoprolol succinate (TOPROL-XL) 25 MG 24 hr tablet, Take 25 mg by mouth 2 (  two) times daily., Disp: , Rfl:  .  niacin (NIASPAN) 500 MG CR tablet, Take 500 mg by mouth every evening. , Disp: , Rfl:  .  omeprazole (PRILOSEC) 20 MG capsule, Take 20 mg by mouth every evening. , Disp: , Rfl:  .  prochlorperazine (COMPAZINE) 10 MG tablet, Take 1 tablet (10 mg total) by mouth every 6 (six) hours as needed for nausea or vomiting., Disp: 30 tablet, Rfl: 2 .  simvastatin (ZOCOR) 20 MG tablet, Take 20 mg by mouth every evening. , Disp: , Rfl:  .  sucralfate (CARAFATE) 1 g tablet, Crush and mix in 1 oz of water q AC and HS., Disp: 120 tablet, Rfl: 2 .  traMADol (ULTRAM) 50 MG tablet, Take 1 tablet (50 mg total) by mouth every 6 (six) hours as needed., Disp: 30 tablet, Rfl: 2 .  zolpidem (AMBIEN) 10 MG tablet, Take 1 tablet (10 mg total) by mouth at bedtime as needed for sleep., Disp: 30 tablet, Rfl: 0  Allergies Morphine and related and Oxycodone  Review of Systems Review of Systems - Oncology ROS negative other than fatigue and weakness.     Physical Exam  Vitals Wt Readings from Last 3 Encounters:  10/04/17 176 lb 3.2 oz (79.9 kg)  09/28/17 180 lb 3.2 oz (81.7 kg)  09/20/17 180 lb 3.2 oz (81.7 kg)   Temp Readings from Last 3 Encounters:  10/04/17 98.3 F (36.8 C) (Oral)  09/28/17 97.8 F (36.6 C) (Oral)  09/28/17 97.8 F (36.6 C) (Oral)   BP Readings from Last 3 Encounters:  10/04/17 120/73  09/28/17 (!) 154/76   09/28/17 126/67   Pulse Readings from Last 3 Encounters:  10/04/17 82  09/28/17 75  09/28/17 68   Constitutional: Well-developed, well-nourished, and in no distress.   HENT: Head: Normocephalic and atraumatic.  Mouth/Throat: No oropharyngeal exudate. Mucosa moist. Eyes: Pupils are equal, round, and reactive to light. Conjunctivae are normal. No scleral icterus.  Neck: Normal range of motion. Neck supple. No JVD present.  Cardiovascular: Normal rate, regular rhythm and normal heart sounds.  Exam reveals no gallop and no friction rub.   No murmur heard. Pulmonary/Chest: Effort normal and breath sounds normal. No respiratory distress. No wheezes.No rales.  Abdominal: Soft. Bowel sounds are normal. No distension. There is no tenderness. There is no guarding. Colostomy noted.   Musculoskeletal: No edema or tenderness.  Lymphadenopathy: No cervical, axillary or supraclavicular adenopathy.  Neurological: Alert and oriented to person, place, and time. No cranial nerve deficit.  Skin: Skin is warm and dry. No rash noted. No erythema. No pallor. Multiple moles noted on back.  Chest has moles and hemangiomas.   Psychiatric: Affect and judgment normal.   Labs No visits with results within 3 Day(s) from this visit.  Latest known visit with results is:  Hospital Outpatient Visit on 09/28/2017  Component Date Value Ref Range Status  . aPTT 09/28/2017 27  24 - 36 seconds Final   Performed at [facility], [place] [address] [place], [place] [address]  . WBC 09/28/2017 6.3  4.0 - 10.5 K/uL Final  . RBC 09/28/2017 3.90* 4.22 - 5.81 MIL/uL Final  . Hemoglobin 09/28/2017 13.2  13.0 - 17.0 g/dL Final  . HCT 09/28/2017 39.7  39.0 - 52.0 % Final  . MCV 09/28/2017 101.8* 78.0 - 100.0 fL Final  . MCH 09/28/2017 33.8  26.0 - 34.0 pg Final  . MCHC 09/28/2017 33.2  30.0 - 36.0 g/dL Final  . RDW  09/28/2017 14.0  11.5 - 15.5 % Final  . Platelets 09/28/2017 208  150 - 400 K/uL Final    Performed at [facility], [place] [address], [place], [place] [address]  . Prothrombin Time 09/28/2017 12.3  11.4 - 15.2 seconds Final  . INR 09/28/2017 0.92   Final   Performed at [facility], [place] [address] [place], [place] [address]     Pathology No orders of the defined types were placed in this encounter.      Zoila Shutter MD

## 2017-10-06 ENCOUNTER — Encounter (HOSPITAL_COMMUNITY): Payer: Self-pay | Admitting: *Deleted

## 2017-10-06 NOTE — Progress Notes (Signed)
Late Entry:  I spoke with Rhonda on 9/17 and ordered Foundation One, PDL-1, BRAF ordered on [id number]. Stage IV and DX: C79.9.

## 2017-10-06 NOTE — Addendum Note (Signed)
Addended by: Glennie Isle on: 10/06/2017 12:05 PM   Modules accepted: Orders

## 2017-10-07 ENCOUNTER — Other Ambulatory Visit (HOSPITAL_COMMUNITY): Payer: Self-pay | Admitting: Nurse Practitioner

## 2017-10-07 DIAGNOSIS — C155 Malignant neoplasm of lower third of esophagus: Secondary | ICD-10-CM

## 2017-10-07 MED ORDER — TRAMADOL HCL 50 MG PO TABS: 50.0000 mg | ORAL_TABLET | ORAL | 2 refills | Status: DC | PRN

## 2017-10-10 ENCOUNTER — Telehealth (HOSPITAL_COMMUNITY): Payer: Self-pay

## 2017-10-10 NOTE — Telephone Encounter (Signed)
Patient's daughter had called earlier today and left a voicemall, I returned her phone call and she told me that her dad had not been feeling well, no appetite, weak, dizzy at times, light headed. Patient has been taking ambien for sleep and tramadol every 4 hours for pain. Recently had an episode of nausea and vomiting, he took a phenergan for that. Patient has stated to his daughter that he is just feeling so bad, depressed at times. Family requesting for a doctor appointment with Dr. Delton Coombes this week. Will schedule and call Jackelyn Poling the daughter back to let her know.

## 2017-10-11 ENCOUNTER — Ambulatory Visit (HOSPITAL_COMMUNITY): Payer: PPO | Admitting: Internal Medicine

## 2017-10-11 ENCOUNTER — Encounter (HOSPITAL_COMMUNITY): Payer: Self-pay | Admitting: General Practice

## 2017-10-11 DIAGNOSIS — Z936 Other artificial openings of urinary tract status: Secondary | ICD-10-CM | POA: Diagnosis not present

## 2017-10-11 DIAGNOSIS — Z933 Colostomy status: Secondary | ICD-10-CM | POA: Diagnosis not present

## 2017-10-11 DIAGNOSIS — C20 Malignant neoplasm of rectum: Secondary | ICD-10-CM | POA: Diagnosis not present

## 2017-10-11 NOTE — Progress Notes (Signed)
[facility] CSW Progress Notes  Request from daughter Bernette Mayers for information/referral for home care resources.  Planning for increased needs in home setting.  Referred to [facility] in [place] for more information on home personal care services and planning.  Daughter may also request skilled home health referral at next MD visit for assessment of needs in home.  Aware that patient is experiencing increased fatigue and may need more help in the home.  Edwyna Shell, LCSW Clinical Social Worker Phone:  [phone]

## 2017-10-12 ENCOUNTER — Encounter (HOSPITAL_COMMUNITY): Payer: Self-pay | Admitting: Internal Medicine

## 2017-10-12 DIAGNOSIS — C799 Secondary malignant neoplasm of unspecified site: Secondary | ICD-10-CM | POA: Diagnosis not present

## 2017-10-13 ENCOUNTER — Ambulatory Visit (HOSPITAL_COMMUNITY): Payer: PPO | Admitting: Hematology

## 2017-10-13 NOTE — Progress Notes (Signed)
[place], [place] [address]   CLINIC:  Medical Oncology/Hematology  PCP:  Redmond School, MD [address] [place] [address] [phone]   REASON FOR VISIT: Follow-up for newly diagnosed melanoma.   CURRENT THERAPY: work-up  BRIEF ONCOLOGIC HISTORY:    Prostate cancer ([place])   12/11/2014 Procedure    Prostatectomy at time of rectal ca resection    12/17/2014 Pathology Results    PROSTATE: PROSTATIC ADENOCARCINOMA WITHOUT TREATMENT EFFECT, GLEASON SCORE 4+4=8 THE TUMOR LOCATED AT THE LEFT ANTERIOR APEX AND THE MID MARGIN OF RESECTION ARE POSITIVE AT THE LEFT ANTERIOR APEX (0.4 CM, GLEASON SCORE 4+4=8) OTHER MARGINS ARE NEGATIVE FOR TUMOR     Rectal cancer ([place])   10/02/2014 Procedure    Colonoscopy by Dr. Carlean Purl- One-third circumferential medium mass was found in the distal rectum; ulcerated, firm anterior over prostate bed, multiple biopsies were performed using cold forceps- suspect carcinoma ? rectal vs recurrence of prostate    10/04/2014 Pathology Results    Rectum, biopsy ulcerated mass-invasive adenocarcinoma consistent with colorectal origin.  CDX 2 positive.  PSA negative.    10/07/2014 Initial Diagnosis    Rectal cancer ([place])    10/08/2014 Imaging    CT CAP- 1. Wall thickening of the rectum compatible with recently diagnosed rectal carcinoma. Tiny subcentimeter perirectal lymph nodes. 2. Multiple bilateral pulmonary nodules as above. While these may be infectious or inflammatory in etiology, metastatic disease in the setting of known rectal carcinoma is not excluded. 3. Nodular soft tissue within the left mainstem bronchus which is nonspecific and may represent mucus however endobronchial nodule is not excluded. Recommend either attention on followup or correlation with bronchoscopy if the patient is having respiratory symptoms. 4. No evidence for focal hepatic lesion. 5. Bilateral low-attenuation renal  lesions with internal densities greater than that of fluid, indeterminate. Recommend attention on followup or definitive characterization with pre and post contrast-enhanced MRI.    10/10/2014 Procedure    EUS by Dr. Ardis Hughs- Non-circumferential, 3.4cm [phone] (Stage IIIB) rectal adenocarcinoma along the anterior wall of the distal rectum with distal edge 1-2cm from the anal verge.    12/11/2014 Procedure    Segmental resection for tumor by Dr. Leighton Ruff    56/38/9373 Pathology Results    Colon, segmental resection for tumor, Bladder and Prostate COLON: INVASIVE COLONIC ADENOCARCINOMA (3.5 CM IN GREATEST DIMENSION) THE CARCINOMA INVASIVE THROUGH THE MUSCULARIS PROPRIA AND PRESENTS AT THE ANTERIOR MESORECTUM RESECTION MARGIN (1.5 CM, R2) THE PROXIMAL AND DISTAL RESECTION MARGINS ARE NEGATIVE FOR TUMOR LYMPHOVASCULAR INVASION IS IDENTIFED ELEVEN BENIGN LYMPH NODES (0/11) PROSTATE: PROSTATIC ADENOCARCINOMA WITHOUT TREATMENT EFFECT, GLEASON SCORE 4+4=8 THE TUMOR LOCATED AT THE LEFT ANTERIOR APEX AND THE MID MARGIN OF RESECTION ARE POSITIVE AT THE LEFT ANTERIOR APEX (0.4 CM, GLEASON SCORE 4+4=8) OTHER MARGINS ARE NEGATIVE FOR TUMOR BLADDER: BENIGN UROTHELIUM AND SUBCUTANOUS TISSUE NEGATIVE FOR MALIGNANCY    06/20/2015 Imaging    CT CAP- 1. Interval cystoprostatectomy, urinary diversion, abdominal perineal resection and sigmoid colostomy. 2. No evidence of abdominal pelvic metastatic disease. 3. Stable small pulmonary nodules bilaterally. Based on stability over nearly 9 months, these are likely benign. Attention on continued follow-up recommended. 4. Stable low-density renal lesions bilaterally. 5. Moderate atherosclerosis.    12/31/2015 Procedure    Colonoscopy by Dr. Heron Sabins and colostomy in the sigmoid colon.  Diverticulosis in the left colon, examination otherwise normal.    06/21/2016 Imaging    CT CAP- Stable exam. No definite evidence  of recurrent or  metastatic carcinoma.  Stable sub-cm noncalcified bilateral pulmonary nodules, most consistent with benign postinflammatory etiology. Recommend continued attention on follow-up imaging.  Cholelithiasis. No radiographic evidence of cholecystitis.  Aortic and coronary artery atherosclerosis.     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)    06/26/2017 -  Chemotherapy    The patient had palonosetron (ALOXI) injection 0.25 mg, 0.25 mg, Intravenous,  Once, 1 of 1 cycle Administration: 0.25 mg (06/27/2017), 0.25 mg (07/05/2017), 0.25 mg (07/12/2017), 0.25 mg (07/19/2017), 0.25 mg (07/26/2017), 0.25 mg (08/02/2017) CARBOplatin (PARAPLATIN) 150 mg in sodium chloride 0.9 % 100 mL chemo infusion, 150 mg (100 % of original dose 150 mg), Intravenous,  Once, 1 of 1 cycle Dose modification: 150 mg (original dose 150 mg, Cycle 1, Reason: Provider Judgment), 125 mg (original dose 125 mg, Cycle 1, Reason: Provider Judgment),   (original dose 150 mg, Cycle 1), 150 mg (original dose 150 mg, Cycle 1, Reason: Provider Judgment), 150 mg (original dose 150 mg, Cycle 1, Reason: Provider Judgment), 150 mg (original dose 150 mg, Cycle 1, Reason: Provider Judgment), 150 mg (original dose 150 mg, Cycle 1, Reason: Provider Judgment) Administration: 150 mg (06/27/2017), 130 mg (07/05/2017), 150 mg (07/12/2017), 150 mg (07/19/2017), 150 mg (07/26/2017), 150 mg (08/02/2017) PACLitaxel (TAXOL) 78 mg in sodium chloride 0.9 % 250 mL chemo infusion (</= 8m/m2), 40 mg/m2 = 78 mg (80 % of original dose 50 mg/m2), Intravenous,  Once, 1 of 1 cycle Dose modification: 40 mg/m2 (80 % of original dose 50 mg/m2, Cycle 1, Reason: Provider Judgment), 40 mg/m2 (80 % of original dose 50 mg/m2, Cycle 1, Reason: Provider Judgment) Administration: 78 mg (06/27/2017), 78 mg (07/05/2017), 78 mg (07/12/2017), 78 mg (07/19/2017), 78 mg (07/26/2017),  78 mg (08/02/2017)  for chemotherapy treatment.       CANCER STAGING: Cancer Staging Rectal cancer ([facility] Staging form: Colon and Rectum, AJCC 8th Edition - Pathologic stage from 05/31/2016: Stage IIA (pT3, pN0, cM0) - Signed by KBaird Cancer PA-C on 05/31/2016    INTERVAL HISTORY:  Mr. [place]89y.o. male returns for routine follow-up for newly diagnosised melanoma. Patient is here today with his daughters. He is reporting new pains in his abdomen and his back. Last visit we increased the frequency of his pain medication and it helped a little but the pain is still bad. He does not have an appetite and is not eating much food. He reports foods don't have the same taste. He denies any nausea, vomiting, or diarrhea. Denies any headaches or vision changes. Denies any SOB.  He is scheduled for an MRI of the brain next week and will follow up afterwards for results and and treatment plan.    REVIEW OF SYSTEMS:  Review of Systems  Constitutional: Positive for fatigue.  Gastrointestinal: Positive for nausea and vomiting.  Neurological: Positive for extremity weakness.  Hematological: Bruises/bleeds easily.  All other systems reviewed and are negative.    PAST MEDICAL/SURGICAL HISTORY:  Past Medical History:  Diagnosis Date  . Arthritis   . Colostomy in place ([facility]   . [place]ophageal cancer ([place]   . GERD (gastroesophageal reflux disease)   . History of colon polyps   . History of hiatal hernia   . History of prostate cancer 2001   s/p  radiactive seed prostate implants/  11/ 2016  prostatectomy at time of rectal  cancer resection (Gleason 4+4)  . HTN (hypertension)    followed by pcp  . Hyperlipidemia   . Presence of urostomy ([place])   . Rectal cancer ([place]) 10/07/2014   no chemo or radiation/ bladder was removed also  . Tinnitus    per pt intermittant   . Wears glasses    Past Surgical History:  Procedure Laterality Date  . APPENDECTOMY  1955  . COLONOSCOPY    . CYSTO N/A  12/11/2014   Procedure:  CYSTO,  PROSTATECTOMY, CYSTECTOMY colon conduit, BILATERAL LYMPH NODE DISSECTION;  Surgeon: Alexis Frock, MD;  Location: [facility];  Service: Urology;  Laterality: N/A;  . ESOPHAGOGASTRODUODENOSCOPY (EGD) WITH PROPOFOL N/A 07/05/2017   Procedure: ESOPHAGOGASTRODUODENOSCOPY (EGD) WITH PROPOFOL;  Surgeon: Aviva Signs, MD;  Location: [facility];  Service: General;  Laterality: N/A;  . EUS N/A 10/10/2014   Procedure: LOWER ENDOSCOPIC ULTRASOUND (EUS);  Surgeon: Milus Banister, MD;  Location: [facility];  Service: Endoscopy;  Laterality: N/A;  . HERNIA REPAIR    . INCISIONAL HERNIA REPAIR N/A 03/02/2017   Procedure: INCISIONAL HERNIA REPAIR WITH MESH;  Surgeon: Leighton Ruff, MD;  Location: [facility];  Service: General;  Laterality: N/A;  . INSERTION OF MESH  03/02/2017   Procedure: INSERTION OF MESH;  Surgeon: Leighton Ruff, MD;  [facility]: [facility];  Service: General;;  . LAPAROSCOPIC INTERNAL HERNIA REPAIR N/A 03/06/2017   Procedure: REOPENING OF RECENT LAPAROTOMY WITH CLOSURE OF INCISIONAL HERNIA WITH MESH;  Surgeon: Leighton Ruff, MD;  Location: [facility];  Service: General;  Laterality: N/A;  . PEG PLACEMENT N/A 07/05/2017   Procedure: PERCUTANEOUS ENDOSCOPIC GASTROSTOMY (PEG) PLACEMENT;  Surgeon: Aviva Signs, MD;  Location: [facility];  Service: General;  Laterality: N/A;  . PORTACATH PLACEMENT Left 07/05/2017   Procedure: INSERTION PORT WITH ATTACHED CATHETER IN LEFT SUBCLAVIAN;  Surgeon: Aviva Signs, MD;  Location: [facility];  Service: General;  Laterality: Left;  . RADIOACTIVE PROSTATE SEED IMPLANTS  02-06-1999  dr Jeffie Pollock  [facility]  . XI ROBOTIC ASSISTED LOWER ANTERIOR RESECTION N/A 12/11/2014   Procedure: XI ROBOTIC ASSISTED ABDOMINAL PERINEAL RESECTION OMENTOPLASTY;  Surgeon: Leighton Ruff, MD;  Location: [facility];  Service: General;  Laterality: N/A;     SOCIAL HISTORY:  Social History   Socioeconomic History  . Marital status: Widowed    Spouse name: Not on file  . Number of  children: 2  . Years of education: Not on file  . Highest education level: Not on file  Occupational History  . Occupation: retired  Scientific laboratory technician  . Financial resource strain: Not very hard  . Food insecurity:    Worry: Never true    Inability: Never true  . Transportation needs:    Medical: No    Non-medical: No  Tobacco Use  . Smoking status: Never Smoker  . Smokeless tobacco: Never Used  Substance and Sexual Activity  . Alcohol use: Yes    Alcohol/week: 0.0 standard drinks    Comment: seldom  . Drug use: No  . Sexual activity: Not Currently    Comment: Widowed  Lifestyle  . Physical activity:    Days per week: 1 day    Minutes per session: 30 min  . Stress: Not at all  Relationships  . Social connections:    Talks on phone: Three times a week    Gets together: Three times a week    Attends religious service: More than 4 times per year    Active member of club or organization: Yes  Attends meetings of clubs or organizations: More than 4 times per year    Relationship status: Widowed  . Intimate partner violence:    Fear of current or ex partner: No    Emotionally abused: No    Physically abused: No    Forced sexual activity: No  Other Topics Concern  . Not on file  Social History Narrative  . Not on file    FAMILY HISTORY:  Family History  Problem Relation Age of Onset  . Diabetes Mother   . Heart disease Mother   . Heart disease Father   . Colon cancer Sister   . Prostate cancer Brother   . Esophageal cancer Neg Hx   . Rectal cancer Neg Hx   . Stomach cancer Neg Hx     CURRENT MEDICATIONS:  Outpatient Encounter Medications as of 10/14/2017  Medication Sig  . ACETAMINOPHEN PO Take 650 mg by mouth every 8 (eight) hours as needed (for pain).   Marland Kitchen aspirin EC 81 MG tablet Take 162 mg by mouth at bedtime. Takes 30 minutes prior to Niacin  . cyanocobalamin 500 MCG tablet Take 500 mcg by mouth daily. Vitamin B-12  . HYDROcodone-acetaminophen (HYCET)  7.5-325 mg/15 ml solution Take 10-15 mLs by mouth 4 (four) times daily as needed for moderate pain.  Marland Kitchen lidocaine-prilocaine (EMLA) cream Apply a quarter size amount to port site 1 hour prior to chemo. Do not rub in. Cover with plastic wrap.  . losartan (COZAAR) 25 MG tablet Take 25 mg by mouth daily.  . metoprolol succinate (TOPROL-XL) 25 MG 24 hr tablet Take 25 mg by mouth 2 (two) times daily.  . niacin (NIASPAN) 500 MG CR tablet Take 500 mg by mouth every evening.   Marland Kitchen omeprazole (PRILOSEC) 20 MG capsule Take 20 mg by mouth every evening.   . prochlorperazine (COMPAZINE) 10 MG tablet Take 1 tablet (10 mg total) by mouth every 6 (six) hours as needed for nausea or vomiting.  . simvastatin (ZOCOR) 20 MG tablet Take 20 mg by mouth every evening.   . sucralfate (CARAFATE) 1 g tablet Crush and mix in 1 oz of water q AC and HS.  . traMADol (ULTRAM) 50 MG tablet Take 1 tablet (50 mg total) by mouth every 4 (four) hours as needed.  . zolpidem (AMBIEN) 5 MG tablet Take 1 tablet (5 mg total) by mouth at bedtime as needed for sleep.  . [DISCONTINUED] zolpidem (AMBIEN) 10 MG tablet Take 1 tablet (10 mg total) by mouth at bedtime as needed for sleep.   No facility-administered encounter medications on file as of 10/14/2017.     ALLERGIES:  Allergies  Allergen Reactions  . Morphine And Related     Caused hallucinations.  . Oxycodone     Caused hallucinations     PHYSICAL EXAM:  ECOG Performance status: 1  Vitals:   10/14/17 0830  BP: 118/66  Pulse: 91  Resp: 16  Temp: 97.6 F (36.4 C)  SpO2: 99%   Filed Weights   10/14/17 0830  Weight: 176 lb 1.6 oz (79.9 kg)    Physical Exam  Constitutional: He is oriented to person, place, and time. He appears well-developed and well-nourished.  Cardiovascular: Normal rate, regular rhythm and normal heart sounds.  Pulmonary/Chest: Effort normal and breath sounds normal.  Musculoskeletal: Normal range of motion.  Neurological: He is alert and  oriented to person, place, and time.  Skin: Skin is warm and dry.  Psychiatric: He has a normal mood and  affect. His behavior is normal. Judgment and thought content normal.     LABORATORY DATA:  I have reviewed the labs as listed.  CBC    Component Value Date/Time   WBC 6.3 09/28/2017 1138   RBC 3.90 (L) 09/28/2017 1138   HGB 13.2 09/28/2017 1138   HCT 39.7 09/28/2017 1138   PLT 208 09/28/2017 1138   MCV 101.8 (H) 09/28/2017 1138   MCH 33.8 09/28/2017 1138   MCHC 33.2 09/28/2017 1138   RDW 14.0 09/28/2017 1138   LYMPHSABS 1.1 09/20/2017 1503   MONOABS 1.0 09/20/2017 1503   EOSABS 0.3 09/20/2017 1503   BASOSABS 0.1 09/20/2017 1503   CMP Latest Ref Rng & Units 09/20/2017 08/24/2017 08/12/2017  Glucose 70 - 99 mg/dL 111(H) 164(H) 124(H)  BUN 8 - 23 mg/dL 16 20 22   Creatinine 0.61 - 1.24 mg/dL 0.87 0.87 0.88  Sodium 135 - 145 mmol/L 136 135 137  Potassium 3.5 - 5.1 mmol/L 3.9 3.8 3.9  Chloride 98 - 111 mmol/L 106 104 104  CO2 22 - 32 mmol/L 24 24 25   Calcium 8.9 - 10.3 mg/dL 9.1 9.0 9.2  Total Protein 6.5 - 8.1 g/dL 6.6 6.3(L) 6.8  Total Bilirubin 0.3 - 1.2 mg/dL 0.6 0.4 0.7  Alkaline Phos 38 - 126 U/L 125 67 88  AST 15 - 41 U/L 33 21 24  ALT 0 - 44 U/L 34 24 28          ASSESSMENT & PLAN:   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 - Barium swallow done at [facility] showed a large mass in the distal esophagus. - PET/CT scan on 8/78/6767 hypermetabolic solid 3.5 x 2.3 cm esophageal mass in the mid to lower third of the thoracic esophagus with max SUV 7.3 and nonspecific mildly hypermetabolic nonenlarged bilateral hilar lymph nodes, indeterminate.  We discussed this with the patient in detail. - EGD on 05/26/2017 shows large fungating mass with no bleeding, 30 cm from incisors, extends to 35 cm, partially obstructing, biopsy  consistent with squamous cell carcinoma. - Started on combination chemoradiation therapy with weekly carboplatin and paclitaxel on 06/27/2017.  He has developed worsening of difficulty swallowing.  He had a PEG tube placed on 07/05/2017 along with the Port-A-Cath on the same day.  He is taking and 1 can of Osmolite 1.5/day.  He is able to drink 3 ensures per day.  He is also eating soft foods. - He finished last cycle of chemotherapy on 08/02/2017 and radiation on 08/05/2017. - His swelling has gotten better.  He is taking in only 1 can of tube feeds per day.  He is able to eat most types of foods without any problems.  He gained some weight from last visit. -I have reviewed the results of the PET CT scan dated 09/16/2017 which showed multiple new hypermetabolic hepatic metastasis throughout the liver.  New hypermetabolic metastatic adenopathy in the upper abdomen.  There is also hypermetabolic hilar lymphadenopathy with new hypermetabolic right lower lobe pulmonary nodule.  Decrease in size and metabolic activity of the primary lesion in the distal esophagus.  2.  Metastatic malignant melanoma: - Biopsy of the liver lesions surprisingly came back as malignant melanoma.  He never had a known history of melanoma resected. -I have discussed prognosis of metastatic malignant melanoma.  This is usually treated in the palliative setting.  PDL 1 is 0%.  Foundation 1 test results are pending. -I talked about combination immunotherapy with nivolumab and ipilimumab given once every 3 weeks for 4 cycles followed by monthly nivolumab.  This has response rates of about 60% in 3 years survival of 70%.  We also talked about single agent nivolumab which has slightly low response rates of 40%.  4-year survival is about 50%.  We also talked about side effects related to immunotherapy including but not limited to dermatitis, pneumonitis, colitis, hepatitis, hypophysitis, reactivation of autoimmune conditions among others. -He is  scheduled to have MRI of the brain on 10/20/2017.  He will see me on 7 October.  We will follow-up on BRAF mutation test results.   3.  Colorectal cancer: - Underwent segmental colon resection in November 2016, found to have a T3N0 adenocarcinoma, with circumferential margins positive.  Patient did not receive any neoadjuvant or adjuvant chemotherapy/radiation therapy.  4.  Prostate cancer: -Originally diagnosed in early 2000s, status post seed implants.  Underwent cystoprostatectomy in November 2016, pathology showed prostatic adenocarcinoma Gleason 8 (4+4).  5.  Pain: - He reports pain in the right loin region.  He also gets occasional pains in the neck region.  He is taking tramadol 50 mg 3 times a day.  This is well controlled.  6.  Nutrition: -he is taking 1 can of tube feeds daily.  His appetite has decreased.  He lost about 4 pounds in the last 2 weeks.  This is partly also from stress and anxiety from new diagnosis.  I do not want to start him on any appetite stimulant at this time.  If he loses more weight we can consider mirtazapine.  I have encouraged him to increase the nutrition to two tube feeds per day.  He is also seeing our dietitian later today.  He denies any difficulty swallowing.      Orders placed this encounter:  Orders Placed This Encounter  Procedures  . CBC with Differential/Platelet  . Comprehensive metabolic panel      Derek Jack, MD [place] [phone]

## 2017-10-14 ENCOUNTER — Inpatient Hospital Stay (HOSPITAL_BASED_OUTPATIENT_CLINIC_OR_DEPARTMENT_OTHER): Payer: PPO | Admitting: Hematology

## 2017-10-14 ENCOUNTER — Encounter (HOSPITAL_COMMUNITY): Payer: Self-pay | Admitting: Hematology

## 2017-10-14 ENCOUNTER — Inpatient Hospital Stay (HOSPITAL_COMMUNITY): Payer: PPO

## 2017-10-14 DIAGNOSIS — Z8546 Personal history of malignant neoplasm of prostate: Secondary | ICD-10-CM | POA: Diagnosis not present

## 2017-10-14 DIAGNOSIS — C787 Secondary malignant neoplasm of liver and intrahepatic bile duct: Secondary | ICD-10-CM

## 2017-10-14 DIAGNOSIS — Z85048 Personal history of other malignant neoplasm of rectum, rectosigmoid junction, and anus: Secondary | ICD-10-CM | POA: Diagnosis not present

## 2017-10-14 DIAGNOSIS — R630 Anorexia: Secondary | ICD-10-CM | POA: Diagnosis not present

## 2017-10-14 DIAGNOSIS — C155 Malignant neoplasm of lower third of esophagus: Secondary | ICD-10-CM | POA: Diagnosis not present

## 2017-10-14 DIAGNOSIS — C439 Malignant melanoma of skin, unspecified: Secondary | ICD-10-CM | POA: Diagnosis not present

## 2017-10-14 DIAGNOSIS — G479 Sleep disorder, unspecified: Secondary | ICD-10-CM

## 2017-10-14 MED ORDER — ZOLPIDEM TARTRATE 5 MG PO TABS: 5.0000 mg | ORAL_TABLET | Freq: Every evening | ORAL | 0 refills | Status: DC | PRN

## 2017-10-14 NOTE — Progress Notes (Addendum)
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  Patient has completed chemotherapy (08/02/17) and radiation on (08/05/17).  Patient with new diagnosis of melanoma biopsy proven from liver (09/28/17).  Patient has a PEG tube. MD awaiting final work up before deciding on treatment plan either combination immunotherapy or single agent nibolumab for melanoma.   Met with patient and 2 daughters after MD visit this am.  Patient reports that appetite has been decreased for about 2 weeks due to weakness, fatigue, not feeling well, dizziness perhaps some anxiety due to recent diagnosis of melanoma.  Reports that he has been eating oatmeal, pudding and chocolate milk and coffee for breakfast, lunch is usually sandwich. Supper is sometimes burger or pizza (1 slice) or sandwich.  Lives alone and does not cook.  Ensure shakes make him nauseated.  Drinks water, tea, coffee, chocolate milk mostly.  Does not like gatorade.  Reports taste alterations of foods tasting really salty.  Reports episode of nausea and vomiting that was relieved with phenergan over the last 2 weeks.   Uses one osmolite 1.5 daily via PEG tube.    Chart reviewed and noted possible depression per daughter.   Medications: tramadol for pain, ambien, Vit b 12, niacin, prilosec, compazine  Labs: reviewed  Anthropometrics:   Weight 176 lb today in clinic decreased from weight on 9/11 of 180 lb. 2% weight loss in 2 weeks.     NUTRITION DIAGNOSIS: Inadequate oral intake decreased    MALNUTRITION DIAGNOSIS: continue to monitor   INTERVENTION:  Patient agreeable to increasing tube feeding of osmolite 1.5 to 2 cartons per day. Also discussed if he is unable to eat at meal times  to give tube feeding to make up for oral calories lost. Concerned with new cancer diagnosis and possibly starting new treatment with potential side effects nutrition may be impacted.  May need to consider increasing tube feeding to at  least 3-4 cartons per day pending oral intake and weight changes.   Discussed with Dr. Raliegh Ip option of adding remeron.  MD wanting to hold off at this time.  Discussed high calorie, high protein options today as well for breakfast.      MONITORING, EVALUATION, GOAL: Patient will consume adequate calories and protein to maintain weight during treatment   NEXT VISIT: to be determined  Desiree Fleming B. Zenia Resides, [place], [place] Registered Dietitian [phone] (pager)

## 2017-10-14 NOTE — Patient Instructions (Signed)
[facility] Discharge Instructions     Thank you for choosing [facility] at [facility] to provide your oncology and hematology care.  To afford each patient quality time with our provider, please arrive at least 15 minutes before your scheduled appointment time.   If you have a lab appointment with the Cancer Center please come in thru the  Main Entrance and check in at the main information desk  You need to re-schedule your appointment should you arrive 10 or more minutes late.  We strive to give you quality time with our providers, and arriving late affects you and other patients whose appointments are after yours.  Also, if you no show three or more times for appointments you may be dismissed from the clinic at the providers discretion.     Again, thank you for choosing [facility].  Our hope is that these requests will decrease the amount of time that you wait before being seen by our physicians.       _____________________________________________________________  Should you have questions after your visit to  Cancer Center, please contact our office at [phone] between the hours of 8:00 a.m. and 4:30 p.m.  Voicemails left after 4:00 p.m. will not be returned until the following business day.  For prescription refill requests, have your pharmacy contact our office and allow 72 hours.    Cancer Center Support Programs:   > Cancer Support Group  2nd Tuesday of the month 1pm-2pm, Journey Room    

## 2017-10-14 NOTE — Assessment & Plan Note (Signed)
1.  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 - Barium swallow done at [facility] showed a large mass in the distal esophagus. - PET/CT scan on 1/61/0960 hypermetabolic solid 3.5 x 2.3 cm esophageal mass in the mid to lower third of the thoracic esophagus with max SUV 7.3 and nonspecific mildly hypermetabolic nonenlarged bilateral hilar lymph nodes, indeterminate.  We discussed this with the patient in detail. - EGD on 05/26/2017 shows large fungating mass with no bleeding, 30 cm from incisors, extends to 35 cm, partially obstructing, biopsy consistent with squamous cell carcinoma. - Started on combination chemoradiation therapy with weekly carboplatin and paclitaxel on 06/27/2017.  He has developed worsening of difficulty swallowing.  He had a PEG tube placed on 07/05/2017 along with the Port-A-Cath on the same day.  He is taking and 1 can of Osmolite 1.5/day.  He is able to drink 3 ensures per day.  He is also eating soft foods. - He finished last cycle of chemotherapy on 08/02/2017 and radiation on 08/05/2017. - His swelling has gotten better.  He is taking in only 1 can of tube feeds per day.  He is able to eat most types of foods without any problems.  He gained some weight from last visit. -I have reviewed the results of the PET CT scan dated 09/16/2017 which showed multiple new hypermetabolic hepatic metastasis throughout the liver.  New hypermetabolic metastatic adenopathy in the upper abdomen.  There is also hypermetabolic hilar lymphadenopathy with new hypermetabolic right lower lobe pulmonary nodule.  Decrease in size and metabolic activity of the primary lesion in the distal esophagus.  2.  Metastatic malignant melanoma: - Biopsy of the liver lesions surprisingly came back as malignant melanoma.  He never had a known history of melanoma resected. -I have discussed prognosis of metastatic malignant melanoma.  This is usually  treated in the palliative setting.  PDL 1 is 0%.  Foundation 1 test results are pending. -I talked about combination immunotherapy with nivolumab and ipilimumab given once every 3 weeks for 4 cycles followed by monthly nivolumab.  This has response rates of about 60% in 3 years survival of 70%.  We also talked about single agent nivolumab which has slightly low response rates of 40%.  4-year survival is about 50%.  We also talked about side effects related to immunotherapy including but not limited to dermatitis, pneumonitis, colitis, hepatitis, hypophysitis, reactivation of autoimmune conditions among others. -He is scheduled to have MRI of the brain on 10/20/2017.  He will see me on 7 October.  We will follow-up on BRAF mutation test results.   3.  Colorectal cancer: - Underwent segmental colon resection in November 2016, found to have a T3N0 adenocarcinoma, with circumferential margins positive.  Patient did not receive any neoadjuvant or adjuvant chemotherapy/radiation therapy.  4.  Prostate cancer: -Originally diagnosed in early 2000s, status post seed implants.  Underwent cystoprostatectomy in November 2016, pathology showed prostatic adenocarcinoma Gleason 8 (4+4).  5.  Pain: - He reports pain in the right loin region.  He also gets occasional pains in the neck region.  He is taking tramadol 50 mg 3 times a day.  This is well controlled.  6.  Nutrition: -he is taking 1 can of tube feeds daily.  His appetite has decreased.  He lost about 4 pounds in the last 2 weeks.  This is partly also from stress and anxiety from new diagnosis.  I do  not want to start him on any appetite stimulant at this time.  If he loses more weight we can consider mirtazapine.  I have encouraged him to increase the nutrition to two tube feeds per day.  He is also seeing our dietitian later today.  He denies any difficulty swallowing.

## 2017-10-17 ENCOUNTER — Telehealth: Payer: Self-pay | Admitting: Nutrition

## 2017-10-17 DIAGNOSIS — R1310 Dysphagia, unspecified: Secondary | ICD-10-CM | POA: Diagnosis not present

## 2017-10-17 DIAGNOSIS — C159 Malignant neoplasm of esophagus, unspecified: Secondary | ICD-10-CM | POA: Diagnosis not present

## 2017-10-17 DIAGNOSIS — K56609 Unspecified intestinal obstruction, unspecified as to partial versus complete obstruction: Secondary | ICD-10-CM | POA: Diagnosis not present

## 2017-10-17 DIAGNOSIS — N2889 Other specified disorders of kidney and ureter: Secondary | ICD-10-CM | POA: Diagnosis not present

## 2017-10-17 NOTE — Telephone Encounter (Signed)
Patient contacted me requesting assistance to help unclog his feeding tube. Patient reports this is the first time he has not been able to use his feeding tube for nutrition. Educated patient to try 30 mL of warm water in his tube and use plunger to gently pull and push water in and out of the tube to see if he can unclog it. Patient verbalized understanding. He will contact me if he is unable to get his tube unclogged.

## 2017-10-20 ENCOUNTER — Ambulatory Visit (HOSPITAL_COMMUNITY)
Admission: RE | Admit: 2017-10-20 | Discharge: 2017-10-20 | Disposition: A | Discharge status: Home / Self Care | Payer: PPO | Source: Ambulatory Visit | Attending: Nurse Practitioner | Admitting: Nurse Practitioner

## 2017-10-20 DIAGNOSIS — D225 Melanocytic nevi of trunk: Secondary | ICD-10-CM | POA: Diagnosis not present

## 2017-10-20 DIAGNOSIS — Z1283 Encounter for screening for malignant neoplasm of skin: Secondary | ICD-10-CM | POA: Diagnosis not present

## 2017-10-20 DIAGNOSIS — C155 Malignant neoplasm of lower third of esophagus: Secondary | ICD-10-CM | POA: Diagnosis not present

## 2017-10-20 DIAGNOSIS — L821 Other seborrheic keratosis: Secondary | ICD-10-CM | POA: Diagnosis not present

## 2017-10-20 DIAGNOSIS — C439 Malignant melanoma of skin, unspecified: Secondary | ICD-10-CM | POA: Diagnosis not present

## 2017-10-20 DIAGNOSIS — D485 Neoplasm of uncertain behavior of skin: Secondary | ICD-10-CM | POA: Diagnosis not present

## 2017-10-20 DIAGNOSIS — D1801 Hemangioma of skin and subcutaneous tissue: Secondary | ICD-10-CM | POA: Diagnosis not present

## 2017-10-20 DIAGNOSIS — L820 Inflamed seborrheic keratosis: Secondary | ICD-10-CM | POA: Diagnosis not present

## 2017-10-20 MED ORDER — GADOBUTROL 1 MMOL/ML IV SOLN
8.0000 mL | Freq: Once | INTRAVENOUS | Status: AC | PRN
  Administered 2017-10-20: 7.5 mL via INTRAVENOUS

## 2017-10-21 ENCOUNTER — Telehealth: Payer: Self-pay | Admitting: General Practice

## 2017-10-21 NOTE — Telephone Encounter (Signed)
Ian Christensen CSW Progress Notes  Call to daughter, Ian Christensen, to follow up on request for information on home care support.  Had been referred to [facility] in [place] to explore options for in home care.  Agency was unable to meet their needs, family has contracted w [name] for in home care services.  Edwyna Shell, LCSW Clinical Social Worker Phone:  [phone]

## 2017-10-24 ENCOUNTER — Encounter (HOSPITAL_COMMUNITY): Payer: Self-pay | Admitting: Hematology

## 2017-10-24 ENCOUNTER — Inpatient Hospital Stay (HOSPITAL_COMMUNITY): Payer: PPO

## 2017-10-24 ENCOUNTER — Inpatient Hospital Stay (HOSPITAL_COMMUNITY): Payer: PPO | Attending: Hematology | Admitting: Hematology

## 2017-10-24 ENCOUNTER — Ambulatory Visit (HOSPITAL_COMMUNITY)
Admission: RE | Admit: 2017-10-24 | Discharge: 2017-10-24 | Disposition: A | Discharge status: Home / Self Care | Payer: PPO | Source: Ambulatory Visit | Attending: Nurse Practitioner | Admitting: Nurse Practitioner

## 2017-10-24 VITALS — BP 84/52 | HR 84 | Temp 97.7°F | Resp 20 | Wt 177.6 lb

## 2017-10-24 DIAGNOSIS — Z8546 Personal history of malignant neoplasm of prostate: Secondary | ICD-10-CM | POA: Diagnosis not present

## 2017-10-24 DIAGNOSIS — Z23 Encounter for immunization: Secondary | ICD-10-CM | POA: Diagnosis not present

## 2017-10-24 DIAGNOSIS — C787 Secondary malignant neoplasm of liver and intrahepatic bile duct: Secondary | ICD-10-CM | POA: Insufficient documentation

## 2017-10-24 DIAGNOSIS — R188 Other ascites: Secondary | ICD-10-CM | POA: Insufficient documentation

## 2017-10-24 DIAGNOSIS — C155 Malignant neoplasm of lower third of esophagus: Secondary | ICD-10-CM | POA: Diagnosis not present

## 2017-10-24 DIAGNOSIS — Z5112 Encounter for antineoplastic immunotherapy: Secondary | ICD-10-CM | POA: Diagnosis not present

## 2017-10-24 DIAGNOSIS — R590 Localized enlarged lymph nodes: Secondary | ICD-10-CM | POA: Insufficient documentation

## 2017-10-24 DIAGNOSIS — C439 Malignant melanoma of skin, unspecified: Secondary | ICD-10-CM | POA: Insufficient documentation

## 2017-10-24 DIAGNOSIS — Z931 Gastrostomy status: Secondary | ICD-10-CM | POA: Diagnosis not present

## 2017-10-24 DIAGNOSIS — R110 Nausea: Secondary | ICD-10-CM | POA: Diagnosis not present

## 2017-10-24 DIAGNOSIS — Z79899 Other long term (current) drug therapy: Secondary | ICD-10-CM | POA: Insufficient documentation

## 2017-10-24 DIAGNOSIS — R630 Anorexia: Secondary | ICD-10-CM

## 2017-10-24 DIAGNOSIS — R109 Unspecified abdominal pain: Secondary | ICD-10-CM

## 2017-10-24 DIAGNOSIS — Z85048 Personal history of other malignant neoplasm of rectum, rectosigmoid junction, and anus: Secondary | ICD-10-CM

## 2017-10-24 DIAGNOSIS — Z933 Colostomy status: Secondary | ICD-10-CM | POA: Insufficient documentation

## 2017-10-24 DIAGNOSIS — K458 Other specified abdominal hernia without obstruction or gangrene: Secondary | ICD-10-CM | POA: Diagnosis not present

## 2017-10-24 DIAGNOSIS — Z7189 Other specified counseling: Secondary | ICD-10-CM

## 2017-10-24 LAB — COMPREHENSIVE METABOLIC PANEL
ALBUMIN: 3.1 g/dL — AB (ref 3.5–5.0)
ALT: 174 U/L — ABNORMAL HIGH (ref 0–44)
ANION GAP: 9 (ref 5–15)
AST: 138 U/L — ABNORMAL HIGH (ref 15–41)
Alkaline Phosphatase: 790 U/L — ABNORMAL HIGH (ref 38–126)
BILIRUBIN TOTAL: 2.1 mg/dL — AB (ref 0.3–1.2)
BUN: 17 mg/dL (ref 8–23)
CO2: 24 mmol/L (ref 22–32)
Calcium: 9 mg/dL (ref 8.9–10.3)
Chloride: 103 mmol/L (ref 98–111)
Creatinine, Ser: 0.93 mg/dL (ref 0.61–1.24)
Glucose, Bld: 141 mg/dL — ABNORMAL HIGH (ref 70–99)
POTASSIUM: 4.8 mmol/L (ref 3.5–5.1)
Sodium: 136 mmol/L (ref 135–145)
TOTAL PROTEIN: 6.8 g/dL (ref 6.5–8.1)

## 2017-10-24 LAB — CBC WITH DIFFERENTIAL/PLATELET
BASOS ABS: 0.1 10*3/uL (ref 0.0–0.1)
BASOS PCT: 1 %
EOS ABS: 0.1 10*3/uL (ref 0.0–0.7)
EOS PCT: 1 %
HEMATOCRIT: 40.3 % (ref 39.0–52.0)
Hemoglobin: 13.1 g/dL (ref 13.0–17.0)
LYMPHS ABS: 1.1 10*3/uL (ref 0.7–4.0)
Lymphocytes Relative: 8 %
MCH: 32.8 pg (ref 26.0–34.0)
MCHC: 32.5 g/dL (ref 30.0–36.0)
MCV: 101 fL — AB (ref 78.0–100.0)
MONOS PCT: 9 %
Monocytes Absolute: 1.2 10*3/uL (ref 0.1–1.0)
NEUTROS PCT: 81 %
Neutro Abs: 11 10*3/uL (ref 1.7–7.7)
Platelets: ADEQUATE 10*3/uL (ref 150–400)
RBC: 3.99 MIL/uL — ABNORMAL LOW (ref 4.22–5.81)
RDW: 13.5 % (ref 11.5–15.5)
WBC: 13.4 10*3/uL — AB (ref 4.0–10.5)

## 2017-10-24 MED ORDER — IOPAMIDOL (ISOVUE-300) INJECTION 61%
100.0000 mL | Freq: Once | INTRAVENOUS | Status: AC | PRN
  Administered 2017-10-24: 100 mL via INTRAVENOUS

## 2017-10-24 NOTE — Assessment & Plan Note (Signed)
1.  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 - Barium swallow done at [facility] showed a large mass in the distal esophagus. - PET/CT scan on 05/26/3265 hypermetabolic solid 3.5 x 2.3 cm esophageal mass in the mid to lower third of the thoracic esophagus with max SUV 7.3 and nonspecific mildly hypermetabolic nonenlarged bilateral hilar lymph nodes, indeterminate.  We discussed this with the patient in detail. - EGD on 05/26/2017 shows large fungating mass with no bleeding, 30 cm from incisors, extends to 35 cm, partially obstructing, biopsy consistent with squamous cell carcinoma. - Started on combination chemoradiation therapy with weekly carboplatin and paclitaxel on 06/27/2017.  He has developed worsening of difficulty swallowing.  He had a PEG tube placed on 07/05/2017 along with the Port-A-Cath on the same day.  He is taking and 1 can of Osmolite 1.5/day.  He is able to drink 3 ensures per day.  He is also eating soft foods. - He finished last cycle of chemotherapy on 08/02/2017 and radiation on 08/05/2017. - PET/CT scan on 09/16/2017 shows decrease in size and metabolic activity of the primary lesion in the distal esophagus.   2.  Metastatic malignant melanoma: - Biopsy of the liver lesions surprisingly came back as malignant melanoma.  He never had a known history of melanoma resected. - We discussed the prognosis of metastatic malignant melanoma and treatment with palliative intent. - Foundation 1 testing shows MS-stable, TMB-low,NRAS Q61L, MYC amplification, PDL 1 TPS of 0% - We talked about combination immunotherapy with nivolumab and ipilimumab given every 3 weeks for 4 cycles followed by monthly nivolumab.  Response rates with a combination regimen is around 60% and 3-year survival of 70%.  We also talked about single agent nivolumab which has slightly low response rates of around 40% and 4-year survival about 50%.  We talked  about the immunotherapy related side effects in detail. -Brain MRI was negative for metastatic disease. - As he is feeling weak, we will start out with single agent nivolumab and add on ipilimumab if he tolerates it well. -He is having intermittent nausea.  I have suggested him to take Compazine 2-3 times a day as a standing order.  3.  Colorectal cancer: - Underwent segmental colon resection in November 2016, found to have a T3N0 adenocarcinoma, with circumferential margins positive.  Patient did not receive any neoadjuvant or adjuvant chemotherapy/radiation therapy.  4.  Prostate cancer: -Originally diagnosed in early 2000s, status post seed implants.  Underwent cystoprostatectomy in November 2016, pathology showed prostatic adenocarcinoma Gleason 8 (4+4).  5.  Pain: - He is complaining of worsening pain in the right loin region.  He is taking tramadol 2-3 times a day.  This is not very well controlled. -I have recommended getting a CT scan of the abdomen and pelvis.  6.  Nutrition: -He is taking and Osmolite 2 cans daily.  Is also eating by mouth.  Weight has been stable.

## 2017-10-24 NOTE — Progress Notes (Signed)
ALERT: A disease instance has been permanently removed from this patient's pathway record and replaced with a new disease instance. Information on the new disease instance will be transmitted in a separate message.  Disease Being Removed: Gastroesophageal  Reason for Removal: Reason not listed

## 2017-10-24 NOTE — Patient Instructions (Addendum)
[place] at [facility]  Discharge Instructions: You will be scheduled for treatment and labs this week.                                            You will follow up with [place] in 3 weeks with labs.                                           Please start taking your compazine every morning before breakfast and in the evening.                                           Stop taking you Losartan.  Seen by dr. Raliegh Ip today _______________________________________________________________  Thank you for choosing [place] at [facility] to provide your oncology and hematology care.  To afford each patient quality time with our providers, please arrive at least 15 minutes before your scheduled appointment.  You need to re-schedule your appointment if you arrive 10 or more minutes late.  We strive to give you quality time with our providers, and arriving late affects you and other patients whose appointments are after yours.  Also, if you no show three or more times for appointments you may be dismissed from the clinic.  Again, thank you for choosing [place] at [place] hope is that these requests will allow you access to exceptional care and in a timely manner. _______________________________________________________________  If you have questions after your visit, please contact our office at [phone] between the hours of 8:30 a.m. and 5:00 p.m. Voicemails left after 4:30 p.m. will not be returned until the following business day. _______________________________________________________________  For prescription refill requests, have your pharmacy contact our office. _______________________________________________________________  Recommendations made by the consultant and any test results will be sent to your referring physician. _______________________________________________________________

## 2017-10-24 NOTE — Progress Notes (Signed)
START ON PATHWAY REGIMEN - Melanoma   Nivolumab 1 mg/kg + Ipilimumab 3 mg/kg q21 Days x 4 Doses:   A cycle is every 21 days:     Nivolumab      Ipilimumab   **Always confirm dose/schedule in your pharmacy ordering system**  Nivolumab 240 mg q14 Days:   A cycle is every 14 days:     Nivolumab   **Always confirm dose/schedule in your pharmacy ordering system**  Patient Characteristics: Stage IV, Unresectable, Symptomatic, First Line, BRAF V600 Wild Type / BRAF V600 Results Pending or Unknown, Candidate for Immunotherapy Disease Subtype: Unknown Current Disease Status: Distant Metastases AJCC 8 Stage Grouping: IV AJCC T Category: TX AJCC N Category: NX AJCC M Category: M1c BRAF V600 Mutation Status: BRAF V600 Wild Type (No Mutation) Metastatic Disease Type: Symptomatic Line of Therapy: First Line Immunotherapy Candidate Status: Candidate for Immunotherapy Intent of Therapy: Non-Curative / Palliative Intent, Discussed with Patient

## 2017-10-24 NOTE — Progress Notes (Signed)
Immunotherapy information given to patient and his two daughters, Jackelyn Poling and Coralyn Mark.  They were given the opportunity to ask any questions and they were answered to patient/family satisfaction.  Advised them that they would get further education on day of treatment.

## 2017-10-24 NOTE — Patient Instructions (Addendum)
[facility] Chemotherapy Teaching   You have been diagnosed with metastatic melanoma.  We are going to treat you with palliative intent which means that your cancer is not curable but it is treatable.  We will be giving you Opdivo (nivolumab) and Yervoy (ipilimumab) every 3 weeks.  This will be given through your port a cath. You will see the doctor regularly throughout treatment.  We monitor your lab work prior to every treatment. The doctor monitors your response to treatment by the way you are feeling, your blood work, and scans periodically.  There will be wait times while you are here for treatment.  It will take about 30 minutes to 1 hour for your lab work to result.  Then there will be wait times while pharmacy mixes your medications.    You will be given the following premedications prior to each treatment: Pepcid and Benadryl - help to prevent allergic reaction to the chemotherapy   Nivolumab (Opdivo)  About This Drug Nivolumab is used to treat cancer. It is given in the vein (IV).  Possible Side Effects . Bone marrow depression. This is a decrease in the number of white blood cells, red blood cells, and platelets. This may raise your risk of infection, make you tired and weak (fatigue), and raise your risk of bleeding. . Tiredness and weakness . Joint, muscle and bone pain . Back pain . Loose bowel movements (diarrhea) . Nausea . Decreased appetite (decreased hunger) . Constipation (not able to move bowels) . Cough and trouble breathing . Upper respiratory infection . Fever . Rash and itching . Electrolyte changes Note: Each of the side effects above was reported in 20% or greater of patients treated with nivolumab. Not all possible side effects are included above. Your side effects may be different or more severe if you receive nivolumab in combination with other chemotherapy agents.  Warnings and Precautions . This drug works with your immune system and can  cause inflammation in any of your organs and tissues and can change how they work. This may put you at risk for developing serious medical problems which can very rarely be fatal. . Colitis (swelling (inflammation) in the colon) - symptoms are loose bowel movements (diarrhea) stomach cramping, and sometimes blood in the bowel movements . Changes in liver function . Changes in kidney function . Inflammation (swelling) of the lungs which can very rarely be fatal - you may have a dry cough or trouble breathing. . This drug may affect some of your hormone glands (especially the thyroid, adrenals, pituitary and pancreas). . Blood sugar levels may change and you may develop diabetes. If you already have diabetes, changes may need to be made to your diabetes medication. . Severe allergic skin reaction which can very rarely be fatal. You may develop blisters on your skin that are filled with fluid or a severe red rash all over your body that may be painful. . Changes in your central nervous system can happen. The central nervous system is made up of your brain and spinal cord. You could feel extreme tiredness, agitation, confusion, hallucinations (see or hear things that are not there), trouble understanding or speaking, loss of control of your bowels or bladder, eyesight changes, numbness or lack of strength to your arms, legs, face, or body, and coma. If you start to have any of these symptoms let your doctor know right away. . While you are getting this drug in your vein (IV), you may have a  reaction to the drug. Sometimes you may be given medication to stop or lessen these side effects. Your nurse will check you closely for these signs: fever or shaking chills, flushing, facial swelling, feeling dizzy, headache, trouble breathing, rash, itching, chest tightness, or chest pain. These reactions may happen after your infusion. If this happens, call 911 for emergency care. . Increased risk of complications, which  may very rarely be fatal, in patients who will undergo a stem cell transplant after receiving nivolumab.  Important Information . This drug may be present in the saliva, tears, sweat, urine, stool, vomit, semen, and vaginal secretions. Talk to your doctor and/or your nurse about the necessary precautions to take during this time.  Treating Side Effects . To decrease infection, wash your hands regularly . Avoid close contact with people who have a cold, the flu, or other infections. . Take your temperature as your doctor or nurse tells you, and whenever you feel like you may have a fever . To help decrease bleeding, use a soft toothbrush. Check with your nurse before using dental floss. . Be very careful when using knives or tools . Use an electric shaver instead of a razor . Ask your doctor or nurse about medicines that are available to help stop or lessen constipation, diarrhea and/or nausea. . Drink plenty of fluids (a minimum of eight glasses per day is recommended). . If you are not able to move your bowels, check with your doctor or nurse before you use any enemas, laxatives, or suppositories . To help with nausea and vomiting, eat small, frequent meals instead of three large meals a day. Choose foods and drinks that are at room temperature. Ask your nurse or doctor about other helpful tips and medicine that is available to help or stop lessen these symptoms. . If you get diarrhea, eat low-fiber foods that are high in protein and calories and avoid foods that can irritate your digestive tracts or lead to cramping. Ask your nurse or doctor about medicine that can lessen or stop your diarrhea. . To help with decreased appetite, eat small, frequent meals . Eat high caloric food such as pudding, ice cream, yogurt and milkshakes. . Manage tiredness by pacing your activities for the day. Be sure to include periods of rest between energy-draining activities . Keeping your pain under control is  important to your wellbeing. Please tell your doctor or nurse if you are experiencing pain. . If you have diabetes, keep good control of your blood sugar level. Tell your nurse or your doctor if your glucose levels are higher or lower than normal . If you get a rash do not put anything on it unless your doctor or nurse says you may. Keep the area around the rash clean and dry. Ask your doctor for medicine if your rash bothers you. . Infusion reactions may occur after your infusion. If this happens, call 911 for emergency care.  Food and Drug Interactions . There are no known interactions of nivolumab with food or other medications. . Tell your doctor and pharmacist about all the medicines and dietary supplements (vitamins, minerals, herbs and others) that you are taking at this time. The safety and use of dietary supplements and alternative diets are often not known. Using these might affect your cancer or interfere with your treatment. Until more is known, you should not use dietary supplements or alternative diets without your cancer doctor's help.  When to Call the Doctor Call your doctor or nurse if  you have any of these symptoms and/or any new or unusual symptoms: . Fever of 100.5 F (38 C) or higher . Chills . Easy bleeding or bruising . Wheezing or trouble breathing . Dry cough, or cough with yellow, green or bloody mucus . Confusion and/or agitation . Hallucinations . Trouble understanding or speaking . Blurry vision or changes in your eyesight . Numbness or lack of strength to your arms, legs, face, or body . Feeling dizzy or lightheaded . Loose bowel movements (diarrhea) more than 4 times a day or diarrhea with weakness or lightheadedness . Nausea that stops you from eating or drinking, and/or that is not relieved by prescribed medicines . Lasting loss of appetite or rapid weight loss of five pounds in a week . No bowel movement for 3 days or you feel uncomfortable . Bad abdominal  pain, especially in upper right area . Fatigue or extreme weakness that interferes with normal activities . Decreased urine . Unusual thirst or passing urine often . Rash that is not relieved by prescribed medicines . Rash or itching . Flu-like symptoms: fever, headache, muscle and joint aches, and fatigue (low energy, feeling weak) . Signs of liver problems: dark urine, pale bowel movements, bad stomach pain, feeling very tired and weak, unusual itching, or yellowing of the eyes or skin. . Signs of infusion reaction: fever or shaking chills, flushing, facial swelling, feeling dizzy, headache, trouble breathing, rash, itching, chest tightness, or chest pain. . If you think you may be pregnant  Reproduction Warnings . Pregnancy warning: This drug can have harmful effects on the unborn baby. Women of child bearing potential should use effective methods of birth control during your cancer treatment and for at least 5 months after treatment. Let your doctor know right away if you think you may be pregnant. . Breastfeeding warning: It is not known if this drug passes into breast milk. For this reason, women should not breast feed during treatment because this drug could enter the breast milk and cause harm to a breast feeding baby. . Fertility warning: Human fertility studies have not been done with this drug. Talk with your doctor or nurse if you plan to have children. Ask for information on sperm or egg banking.  Ipilimumab Curt Bears)  About This Drug Ipilimumab is used to treat cancer. It is given in the vein (IV).  This drug will take 90 minutes to infuse.   Possible Side Effects . Tiredness . Loose bowel movements (diarrhea) . Colitis. This is swelling (inflammation) in the colon - symptoms are loose bowel movements (diarrhea) stomach cramping, and sometimes blood in the bowel movements . Nausea and throwing up (vomiting) . Decreased appetite (decreased hunger) . Weight loss . Headache .  Fever . Trouble sleeping . Rash and itching Note: Each of the side effects above was reported in 5% or greater of patients treated with ipilimumab. Not all possible side effects are included above.  Warnings and Precautions . This drug works with your immune system and can cause inflammation in any of your organs and tissues and can change how they work. This may put you at risk for developing serious medical problems which can very rarely be fatal. . Severe allergic skin reaction which can very rarely be fatal. You may develop blisters on your skin that are filled with fluid or a severe red rash all over your body that may be painful. . Severe colitis which can very rarely be fatal. This is swelling (inflammation) in the colon. Marland Kitchen  Changes in your liver function which can very rarely cause liver failure and be fatal . This drug may affect some of your hormone glands (especially the thyroid, adrenals, pituitary and pancreas). . Inflammation of your nerves. You may feel numbness, tingling, or pain in your hands and feet. It may be hard for you to button your clothes, open jars, or walk as usual. The effect on the nerves may get worse with more doses of the drug. These effects get better in some people after the drug is stopped but it does not get better in all people. Very rarely, this can affect the nerves and muscles in your upper and lower body and cause paralysis. . Inflammation of your eye and/or other changes in eyesight  Important Information . This drug may be present in the saliva, tears, sweat, urine, stool, vomit, semen, and vaginal secretions. Talk to your doctor and/or your nurse about the necessary precautions to take during this time.  Treating Side Effects . Drink plenty of fluids (a minimum of eight glasses per day is recommended) . To help with decreased appetite, eat small, frequent meals . Eat high caloric food such as pudding, ice cream, yogurt and milkshakes. . To help with  weight loss, drink fluids that contribute calories (whole milk, juice, soft drinks, sweetened beverages, milkshakes, and nutritional supplements) instead of water. . Include a source of protein at every meal and snack, such as meat, poultry, fish, dry beans, tofu, eggs, nuts, milk, yogurt, cheese, ice cream, pudding, and nutritional supplements. . If you throw up or have loose bowel movements, you should drink more fluids so that you do not become dehydrated (lack water in the body from losing too much fluid). . To help with nausea and vomiting, eat small, frequent meals instead of three large meals a day. Choose foods and drinks that are at room temperature. Ask your nurse or doctor about other helpful tips and medicine that is available to help or stop lessen these symptoms. . If you get diarrhea, eat low-fiber foods that are high in protein and calories and avoid foods that can irritate your digestive tracts or lead to cramping. . Ask your nurse or doctor about medicine that can lessen or stop your diarrhea. Marland Kitchen Keeping your pain under control is important to your well-being. Please tell your doctor or nurse if you are experiencing pain. . If you get a rash do not put anything on it unless your doctor or nurse says you may. Keep the area around the rash clean and dry. Ask your doctor for medicine if your rash bothers you . If you are having trouble sleeping, talk to your nurse or doctor on tips to help you sleep better . If you have numbness and tingling in your hands and feet, be careful when cooking, walking, and handling sharp objects and hot liquids.  Food and Drug Interactions . There are no known interactions of ipilimumab with food or other medications. . Tell your doctor and pharmacist about all the medicines and dietary supplements (vitamins, minerals, herbs and others) that you are taking at this time. The safety and use of dietary supplements and alternative diets are often not known.  Using these might affect your cancer or interfere with your treatment. Until more is known, you should not use dietary supplements or alternative diets without your cancer doctor's help.  When to Call the Doctor Call your doctor or nurse if you have any of these symptoms and/or any new or unusual  symptoms: . Fever of 100.5 F (38 C) or higher . Chills . Flu-like symptoms: fever, headache, muscle and joint aches, and fatigue (low energy, feeling weak) . Lasting loss of appetite or rapid weight loss of five pounds in a week . Nausea that stops you from eating or drinking and/or is not relieved by prescribed medicines . Throwing up more than 3 times a day . Loose bowel movements (diarrhea) 4 times or loose bowel movements with lack of strength or a feeling of being dizzy . Pain in your abdomen that does not go away . Blood in your stool . Headache that does not go away . Blurred vision or other changes in eyesight . Signs of possible liver problems: dark urine, pale bowel movements, bad stomach pain, feeling very tired and weak, unusual itching, or yellowing of the eyes or skin . A new rash or a rash that is not relieved by prescribed medicines . Numbness, tingling, or pain your hands and feet . If you think you may be pregnant  Reproduction Warnings . Pregnancy warning: This drug can have harmful effects on the unborn baby. Women of child bearing potential should use effective methods of birth control during your cancer treatment and for at least 3 months after treatment. Let your doctor know right away if you think you may be pregnant . Breastfeeding warning: It is not known if this drug passes into breast milk. For this reason, women should not breast feed during treatment and for 3 months after treatment because this drug could enter the breast milk and cause harm to a breast feeding baby. . Fertility warning: Human fertility studies have not been done with this drug. Talk with your doctor  or nurse if you plan to have children. Ask for information on sperm or egg banking.    SELF CARE ACTIVITIES WHILE ON CHEMOTHERAPY:  Hydration Increase your fluid intake 48 hours prior to treatment and drink at least 8 to 12 cups (64 ounces) of water/decaffeinated beverages per day after treatment. You can still have your cup of coffee or soda but these beverages do not count as part of your 8 to 12 cups that you need to drink daily. No alcohol intake.  Medications Continue taking your normal prescription medication as prescribed.  If you start any new herbal or new supplements please let us know first to make sure it is safe.  Mouth Care Have teeth cleaned professionally before starting treatment. Keep dentures and partial plates clean. Use soft toothbrush and do not use mouthwashes that contain alcohol. Biotene is a good mouthwash that is available at most pharmacies or may be ordered by calling [phone]. Use warm salt water gargles (1 teaspoon salt per 1 quart warm water) before and after meals and at bedtime. Or you may rinse with 2 tablespoons of three-percent hydrogen peroxide mixed in eight ounces of water. If you are still having problems with your mouth or sores in your mouth please call the clinic. If you need dental work, please let the doctor know before you go for your appointment so that we can coordinate the best possible time for you in regards to your chemo regimen. You need to also let your dentist know that you are actively taking chemo. We may need to do labs prior to your dental appointment.  Skin Care Always use sunscreen that has not expired and with SPF (Sun Protection Factor) of 50 or higher. Wear hats to protect your head from the sun. Remember  to use sunscreen on your hands, ears, face, & feet.  Use good moisturizing lotions such as udder cream, eucerin, or even Vaseline. Some chemotherapies can cause dry skin, color changes in your skin and nails.    . Avoid long,  hot showers or baths. . Use gentle, fragrance-free soaps and laundry detergent. . Use moisturizers, preferably creams or ointments rather than lotions because the thicker consistency is better at preventing skin dehydration. Apply the cream or ointment within 15 minutes of showering. Reapply moisturizer at night, and moisturize your hands every time after you wash them.  Hair Loss (if your doctor says your hair will fall out)  . If your doctor says that your hair is likely to fall out, decide before you begin chemo whether you want to wear a wig. You may want to shop before treatment to match your hair color. . Hats, turbans, and scarves can also camouflage hair loss, although some people prefer to leave their heads uncovered. If you go bare-headed outdoors, be sure to use sunscreen on your scalp. . Cut your hair short. It eases the inconvenience of shedding lots of hair, but it also can reduce the emotional impact of watching your hair fall out. . Don't perm or color your hair during chemotherapy. Those chemical treatments are already damaging to hair and can enhance hair loss. Once your chemo treatments are done and your hair has grown back, it's OK to resume dyeing or perming hair. With chemotherapy, hair loss is almost always temporary. But when it grows back, it may be a different color or texture. In older adults who still had hair color before chemotherapy, the new growth may be completely gray.  Often, new hair is very fine and soft.  Infection Prevention Please wash your hands for at least 30 seconds using warm soapy water. Handwashing is the #1 way to prevent the spread of germs. Stay away from sick people or people who are getting over a cold. If you develop respiratory systems such as green/yellow mucus production or productive cough or persistent cough let us know and we will see if you need an antibiotic. It is a good idea to keep a pair of gloves on when going into grocery stores/[employer]  to decrease your risk of coming into contact with germs on the carts, etc. Carry alcohol hand gel with you at all times and use it frequently if out in public. If your temperature reaches 100.5 or higher please call the clinic and let us know.  If it is after hours or on the weekend please go to the ER if your temperature is over 100.5.  Please have your own personal thermometer at home to use.    Sex and bodily fluids If you are going to have sex, a condom must be used to protect the person that isn't taking chemotherapy. Chemo can decrease your libido (sex drive). For a few days after chemotherapy, chemotherapy can be excreted through your bodily fluids.  When using the toilet please close the lid and flush the toilet twice.  Do this for a few day after you have had chemotherapy.   Effects of chemotherapy on your sex life Some changes are simple and won't last long. They won't affect your sex life permanently. Sometimes you may feel: . too tired . not strong enough to be very active . sick or sore  . not in the mood . anxious or low Your anxiety might not seem related to sex. For example, you  may be worried about the cancer and how your treatment is going. Or you may be worried about money, or about how you family are coping with your illness. These things can cause stress, which can affect your interest in sex. It's important to talk to your partner about how you feel. Remember - the changes to your sex life don't usually last long. There's usually no medical reason to stop having sex during chemo. The drugs won't have any long term physical effects on your performance or enjoyment of sex. Cancer can't be passed on to your partner during sex  Contraception It's important to use reliable contraception during treatment. Avoid getting pregnant while you or your partner are having chemotherapy. This is because the drugs may harm the baby. Sometimes chemotherapy drugs can leave a man or woman  infertile.  This means you would not be able to have children in the future. You might want to talk to someone about permanent infertility. It can be very difficult to learn that you may no longer be able to have children. Some people find counselling helpful. There might be ways to preserve your fertility, although this is easier for men than for women. You may want to speak to a fertility expert. You can talk about sperm banking or harvesting your eggs. You can also ask about other fertility options, such as donor eggs. If you have or have had breast cancer, your doctor might advise you not to take the contraceptive pill. This is because the hormones in it might affect the cancer.  It is not known for sure whether or not chemotherapy drugs can be passed on through semen or secretions from the vagina. Because of this some doctors advise people to use a barrier method if you have sex during treatment. This applies to vaginal, anal or oral sex. Generally, doctors advise a barrier method only for the time you are actually having the treatment and for about a week after your treatment. Advice like this can be worrying, but this does not mean that you have to avoid being intimate with your partner. You can still have close contact with your partner and continue to enjoy sex.  Animals If you have cats or birds we just ask that you not change the litter or change the cage.  Please have someone else do this for you while you are on chemotherapy.   Food Safety During and After Cancer Treatment Food safety is important for people both during and after cancer treatment. Cancer and cancer treatments, such as chemotherapy, radiation therapy, and stem cell/bone marrow transplantation, often weaken the immune system. This makes it harder for your body to protect itself from foodborne illness, also called food poisoning. Foodborne illness is caused by eating food that contains harmful bacteria, parasites, or  viruses.  Foods to avoid Some foods have a higher risk of becoming tainted with bacteria. These include: Marland Kitchen Unwashed fresh fruit and vegetables, especially leafy vegetables that can hide dirt and other contaminants . Raw sprouts, such as alfalfa sprouts . Raw or undercooked beef, especially ground beef, or other raw or undercooked meat and poultry . Fatty, fried, or spicy foods immediately before or after treatment.  These can sit heavy on your stomach and make you feel nauseous. . Raw or undercooked shellfish, such as oysters. . Sushi and sashimi, which often contain raw fish.  . Unpasteurized beverages, such as unpasteurized fruit juices, raw milk, raw yogurt, or cider . Undercooked eggs, such as soft boiled,  over easy, and poached; raw, unpasteurized eggs; or foods made with raw egg, such as homemade raw cookie dough and homemade mayonnaise Simple steps for food safety Shop smart. . Do not buy food stored or displayed in an unclean area. . Do not buy bruised or damaged fruits or vegetables. . Do not buy cans that have cracks, dents, or bulges. . Pick up foods that can spoil at the end of your shopping trip and store them in a cooler on the way home. Prepare and clean up foods carefully. . Rinse all fresh fruits and vegetables under running water, and dry them with a clean towel or paper towel. . Clean the top of cans before opening them. . After preparing food, wash your hands for 20 seconds with hot water and soap. Pay special attention to areas between fingers and under nails. . Clean your utensils and dishes with hot water and soap. Marland Kitchen Disinfect your kitchen and cutting boards using 1 teaspoon of liquid, unscented bleach mixed into 1 quart of water.   Dispose of old food. . Eat canned and packaged food before its expiration date (the "use by" or "best before" date). . Consume refrigerated leftovers within 3 to 4 days. After that time, throw out the food. Even if the food does not smell  or look spoiled, it still may be unsafe. Some bacteria, such as Listeria, can grow even on foods stored in the refrigerator if they are kept for too long. Take precautions when eating out. . At restaurants, avoid buffets and salad bars where food sits out for a long time and comes in contact with many people. Food can become contaminated when someone with a virus, often a norovirus, or another "bug" handles it. . Put any leftover food in a "to-go" container yourself, rather than having the server do it. And, refrigerate leftovers as soon as you get home. . Choose restaurants that are clean and that are willing to prepare your food as you order it cooked.   MEDICATIONS:                                                                                                                                                                Compazine/Prochlorperazine 10mg  tablet. Take 1 tablet every 6 hours as needed for nausea/vomiting. (This can make you sleepy)   EMLA cream. Apply a quarter size amount to port site 1 hour prior to chemo. Do not rub in. Cover with plastic wrap.   Over-the-Counter Meds:  Colace - 100 mg capsules - take 2 capsules daily.  If this doesn't help then you can increase to 2 capsules twice daily.  Call us if this does not help your bowels move.   Imodium 2mg  capsule. Take 2 capsules after the 1st loose  stool and then 1 capsule every 2 hours until you go a total of 12 hours without having a loose stool. Call the [facility] if loose stools continue. If diarrhea occurs at bedtime, take 2 capsules at bedtime. Then take 2 capsules every 4 hours until morning. Call [facility].    Diarrhea Sheet   If you are having loose stools/diarrhea, please purchase Imodium and begin taking as outlined:  At the first sign of poorly formed or loose stools you should begin taking Imodium (loperamide) 2 mg capsules.  Take two caplets (4mg ) followed by one caplet (2mg ) every 2 hours until you  have had no diarrhea for 12 hours.  During the night take two caplets (4mg ) at bedtime and continue every 4 hours during the night until the morning.  Stop taking Imodium only after there is no sign of diarrhea for 12 hours.    Always call the [facility] if you are having loose stools/diarrhea that you can't get under control.  Loose stools/diarrhea leads to dehydration (loss of water) in your body.  We have other options of trying to get the loose stools/diarrhea to stop but you must let us know!   Constipation Sheet  Colace - 100 mg capsules - take 2 capsules daily.  If this doesn't help then you can increase to 2 capsules twice daily.  Please call if the above does not work for you.   Do not go more than 2 days without a bowel movement.  It is very important that you do not become constipated.  It will make you feel sick to your stomach (nausea) and can cause abdominal pain and vomiting.   Nausea Sheet   Compazine/Prochlorperazine 10mg  tablet. Take 1 tablet every 6 hours as needed for nausea/vomiting. (This can make you sleepy)  If you are having persistent nausea (nausea that does not stop) please call the [facility] and let us know the amount of nausea that you are experiencing.  If you begin to vomit, you need to call the [facility] and if it is the weekend and you have vomited more than one time and can't get it to stop-go to the Emergency Room.  Persistent nausea/vomiting can lead to dehydration (loss of fluid in your body) and will make you feel terrible.   Ice chips, sips of clear liquids, foods that are @ room temperature, crackers, and toast tend to be better tolerated.   SYMPTOMS TO REPORT AS SOON AS POSSIBLE AFTER TREATMENT:   FEVER GREATER THAN 100.5 F  CHILLS WITH OR WITHOUT FEVER  NAUSEA AND VOMITING THAT IS NOT CONTROLLED WITH YOUR NAUSEA MEDICATION  UNUSUAL SHORTNESS OF BREATH  UNUSUAL BRUISING OR BLEEDING  TENDERNESS IN MOUTH AND THROAT WITH OR WITHOUT  PRESENCE OF ULCERS  URINARY PROBLEMS  BOWEL PROBLEMS  UNUSUAL RASH      Wear comfortable clothing and clothing appropriate for easy access to any Portacath or PICC line. Let us know if there is anything that we can do to make your therapy better!    What to do if you need assistance after hours or on the weekends: CALL [phone].  HOLD on the line, do not hang up.  You will hear multiple messages but at the end you will be connected with a nurse triage line.  They will contact the doctor if necessary.  Most of the time they will be able to assist you.  Do not call the hospital operator.      I have  been informed and understand all of the instructions given to me and have received a copy. I have been instructed to call the clinic [phone] or my family physician as soon as possible for continued medical care, if indicated. I do not have any more questions at this time but understand that I may call the [facility] or the Patient Navigator at [phone] during office hours should I have questions or need assistance in obtaining follow-up care.

## 2017-10-24 NOTE — Progress Notes (Signed)
[address], [place] [address]   CLINIC:  Medical Oncology/Hematology  PCP:  [facility], [place] [address] [phone]   REASON FOR VISIT: Follow-up for newly diagnosed melanoma   CURRENT THERAPY: Opdivo every 3 weeks  BRIEF ONCOLOGIC HISTORY:    Prostate cancer ([facility])   12/11/2014 Procedure    Prostatectomy at time of rectal ca resection    12/17/2014 Pathology Results    PROSTATE: PROSTATIC ADENOCARCINOMA WITHOUT TREATMENT EFFECT, GLEASON SCORE 4+4=8 THE TUMOR LOCATED AT THE LEFT ANTERIOR APEX AND THE MID MARGIN OF RESECTION ARE POSITIVE AT THE LEFT ANTERIOR APEX (0.4 CM, GLEASON SCORE 4+4=8) OTHER MARGINS ARE NEGATIVE FOR TUMOR     Rectal cancer ([facility])   10/02/2014 Procedure    Colonoscopy by Dr. Carlean Purl- One-third circumferential medium mass was found in the distal rectum; ulcerated, firm anterior over prostate bed, multiple biopsies were performed using cold forceps- suspect carcinoma ? rectal vs recurrence of prostate    10/04/2014 Pathology Results    Rectum, biopsy ulcerated mass-invasive adenocarcinoma consistent with colorectal origin.  CDX 2 positive.  PSA negative.    10/07/2014 Initial Diagnosis    Rectal cancer ([facility])    10/08/2014 Imaging    CT CAP- 1. Wall thickening of the rectum compatible with recently diagnosed rectal carcinoma. Tiny subcentimeter perirectal lymph nodes. 2. Multiple bilateral pulmonary nodules as above. While these may be infectious or inflammatory in etiology, metastatic disease in the setting of known rectal carcinoma is not excluded. 3. Nodular soft tissue within the left mainstem bronchus which is nonspecific and may represent mucus however endobronchial nodule is not excluded. Recommend either attention on followup or correlation with bronchoscopy if the patient is having respiratory symptoms. 4. No evidence for focal hepatic lesion. 5. Bilateral  low-attenuation renal lesions with internal densities greater than that of fluid, indeterminate. Recommend attention on followup or definitive characterization with pre and post contrast-enhanced MRI.    10/10/2014 Procedure    EUS by Dr. Ardis Hughs- Non-circumferential, 3.4cm [phone] (Stage IIIB) rectal adenocarcinoma along the anterior wall of the distal rectum with distal edge 1-2cm from the anal verge.    12/11/2014 Procedure    Segmental resection for tumor by Dr. Leighton Ruff    14/97/0263 Pathology Results    Colon, segmental resection for tumor, Bladder and Prostate COLON: INVASIVE COLONIC ADENOCARCINOMA (3.5 CM IN GREATEST DIMENSION) THE CARCINOMA INVASIVE THROUGH THE MUSCULARIS PROPRIA AND PRESENTS AT THE ANTERIOR MESORECTUM RESECTION MARGIN (1.5 CM, R2) THE PROXIMAL AND DISTAL RESECTION MARGINS ARE NEGATIVE FOR TUMOR LYMPHOVASCULAR INVASION IS IDENTIFED ELEVEN BENIGN LYMPH NODES (0/11) PROSTATE: PROSTATIC ADENOCARCINOMA WITHOUT TREATMENT EFFECT, GLEASON SCORE 4+4=8 THE TUMOR LOCATED AT THE LEFT ANTERIOR APEX AND THE MID MARGIN OF RESECTION ARE POSITIVE AT THE LEFT ANTERIOR APEX (0.4 CM, GLEASON SCORE 4+4=8) OTHER MARGINS ARE NEGATIVE FOR TUMOR BLADDER: BENIGN UROTHELIUM AND SUBCUTANOUS TISSUE NEGATIVE FOR MALIGNANCY    06/20/2015 Imaging    CT CAP- 1. Interval cystoprostatectomy, urinary diversion, abdominal perineal resection and sigmoid colostomy. 2. No evidence of abdominal pelvic metastatic disease. 3. Stable small pulmonary nodules bilaterally. Based on stability over nearly 9 months, these are likely benign. Attention on continued follow-up recommended. 4. Stable low-density renal lesions bilaterally. 5. Moderate atherosclerosis.    12/31/2015 Procedure    Colonoscopy by Dr. Heron Sabins and colostomy in the sigmoid colon.  Diverticulosis in the left colon, examination otherwise normal.    06/21/2016 Imaging    CT CAP- Stable exam.  No definite evidence of  recurrent or metastatic carcinoma.  Stable sub-cm noncalcified bilateral pulmonary nodules, most consistent with benign postinflammatory etiology. Recommend continued attention on follow-up imaging.  Cholelithiasis. No radiographic evidence of cholecystitis.  Aortic and coronary artery atherosclerosis.     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 ([facility])    06/26/2017 - 08/08/2017 Chemotherapy    The patient had palonosetron (ALOXI) injection 0.25 mg, 0.25 mg, Intravenous,  Once, 1 of 1 cycle Administration: 0.25 mg (06/27/2017), 0.25 mg (07/05/2017), 0.25 mg (07/12/2017), 0.25 mg (07/19/2017), 0.25 mg (07/26/2017), 0.25 mg (08/02/2017) CARBOplatin (PARAPLATIN) 150 mg in sodium chloride 0.9 % 100 mL chemo infusion, 150 mg (100 % of original dose 150 mg), Intravenous,  Once, 1 of 1 cycle Dose modification: 150 mg (original dose 150 mg, Cycle 1, Reason: Provider Judgment), 125 mg (original dose 125 mg, Cycle 1, Reason: Provider Judgment),   (original dose 150 mg, Cycle 1), 150 mg (original dose 150 mg, Cycle 1, Reason: Provider Judgment), 150 mg (original dose 150 mg, Cycle 1, Reason: Provider Judgment), 150 mg (original dose 150 mg, Cycle 1, Reason: Provider Judgment), 150 mg (original dose 150 mg, Cycle 1, Reason: Provider Judgment) Administration: 150 mg (06/27/2017), 130 mg (07/05/2017), 150 mg (07/12/2017), 150 mg (07/19/2017), 150 mg (07/26/2017), 150 mg (08/02/2017) PACLitaxel (TAXOL) 78 mg in sodium chloride 0.9 % 250 mL chemo infusion (</= 58m/m2), 40 mg/m2 = 78 mg (80 % of original dose 50 mg/m2), Intravenous,  Once, 1 of 1 cycle Dose modification: 40 mg/m2 (80 % of original dose 50 mg/m2, Cycle 1, Reason: Provider Judgment), 40 mg/m2 (80 % of original dose 50 mg/m2, Cycle 1, Reason: Provider Judgment) Administration: 78 mg (06/27/2017), 78 mg (07/05/2017), 78 mg (07/12/2017), 78 mg  (07/19/2017), 78 mg (07/26/2017), 78 mg (08/02/2017)  for chemotherapy treatment.       CANCER STAGING: Cancer Staging Rectal cancer ([facility] Staging form: Colon and Rectum, AJCC 8th Edition - Pathologic stage from 05/31/2016: Stage IIA (pT3, pN0, cM0) - Signed by KBaird Cancer PA-C on 05/31/2016    INTERVAL HISTORY:  Mr. [email or web address]. male returns for routine follow-up for new diagnosis of melanoma. Patient is here today with his daughters. He has had more nausea and is having vomiting this morning. His appetite is decreased and and the nausea happens almost everyday. He is still using his PEG tube daily. He is having increase right sided flank pain. He also reports his urine looks amber from time to time. He reports a history of kidney stones. He reports his appettie as 75% and he maintaining his weight at this time. His energy level is 50%. He denies any diarrhea or constipation. Denies any headaches or vision changes. Denies abdominal pain.     REVIEW OF SYSTEMS:  Review of Systems  Gastrointestinal: Positive for nausea and vomiting.  Musculoskeletal: Positive for back pain and flank pain.  Neurological: Positive for extremity weakness.  All other systems reviewed and are negative.    PAST MEDICAL/SURGICAL HISTORY:  Past Medical History:  Diagnosis Date  . Arthritis   . Colostomy in place ([facility]   . Esophageal cancer ([place]   . GERD (gastroesophageal reflux disease)   . History of colon polyps   . History of hiatal hernia   . History of prostate cancer 2001   s/p  radiactive seed prostate implants/  11/ 2016  prostatectomy  at time of rectal cancer resection (Gleason 4+4)  . HTN (hypertension)    followed by pcp  . Hyperlipidemia   . Presence of urostomy ([facility])   . Rectal cancer ([place]) 10/07/2014   no chemo or radiation/ bladder was removed also  . Tinnitus    per pt intermittant   . Wears glasses    Past Surgical History:  Procedure Laterality Date  . APPENDECTOMY  1955    . COLONOSCOPY    . CYSTO N/A 12/11/2014   Procedure:  CYSTO,  PROSTATECTOMY, CYSTECTOMY colon conduit, BILATERAL LYMPH NODE DISSECTION;  Surgeon: Alexis Frock, MD;  Location: [facility];  Service: Urology;  Laterality: N/A;  . ESOPHAGOGASTRODUODENOSCOPY (EGD) WITH PROPOFOL N/A 07/05/2017   Procedure: ESOPHAGOGASTRODUODENOSCOPY (EGD) WITH PROPOFOL;  Surgeon: Aviva Signs, MD;  Location: [facility];  Service: General;  Laterality: N/A;  . EUS N/A 10/10/2014   Procedure: LOWER ENDOSCOPIC ULTRASOUND (EUS);  Surgeon: Milus Banister, MD;  Location: [facility];  Service: Endoscopy;  Laterality: N/A;  . HERNIA REPAIR    . INCISIONAL HERNIA REPAIR N/A 03/02/2017   Procedure: INCISIONAL HERNIA REPAIR WITH MESH;  Surgeon: Leighton Ruff, MD;  Location: [facility];  Service: General;  Laterality: N/A;  . INSERTION OF MESH  03/02/2017   Procedure: INSERTION OF MESH;  Surgeon: Leighton Ruff, MD;  Location: [facility];  Service: General;;  . LAPAROSCOPIC INTERNAL HERNIA REPAIR N/A 03/06/2017   Procedure: REOPENING OF RECENT LAPAROTOMY WITH CLOSURE OF INCISIONAL HERNIA WITH MESH;  Surgeon: Leighton Ruff, MD;  Location: [facility];  Service: General;  Laterality: N/A;  . PEG PLACEMENT N/A 07/05/2017   Procedure: PERCUTANEOUS ENDOSCOPIC GASTROSTOMY (PEG) PLACEMENT;  Surgeon: Aviva Signs, MD;  Location: [facility];  Service: General;  Laterality: N/A;  . PORTACATH PLACEMENT Left 07/05/2017   Procedure: INSERTION PORT WITH ATTACHED CATHETER IN LEFT SUBCLAVIAN;  Surgeon: Aviva Signs, MD;  Location: [facility];  Service: General;  Laterality: Left;  . RADIOACTIVE PROSTATE SEED IMPLANTS  02-06-1999  dr Jeffie Pollock  [facility]  . XI ROBOTIC ASSISTED LOWER ANTERIOR RESECTION N/A 12/11/2014   Procedure: XI ROBOTIC ASSISTED ABDOMINAL PERINEAL RESECTION OMENTOPLASTY;  Surgeon: Leighton Ruff, MD;  Location: [facility];  Service: General;  Laterality: N/A;     SOCIAL HISTORY:  Social History   Socioeconomic History  . Marital status: Widowed    Spouse  name: Not on file  . Number of children: 2  . Years of education: Not on file  . Highest education level: Not on file  Occupational History  . Occupation: retired  Scientific laboratory technician  . Financial resource strain: Not very hard  . Food insecurity:    Worry: Never true    Inability: Never true  . Transportation needs:    Medical: No    Non-medical: No  Tobacco Use  . Smoking status: Never Smoker  . Smokeless tobacco: Never Used  Substance and Sexual Activity  . Alcohol use: Yes    Alcohol/week: 0.0 standard drinks    Comment: seldom  . Drug use: No  . Sexual activity: Not Currently    Comment: Widowed  Lifestyle  . Physical activity:    Days per week: 1 day    Minutes per session: 30 min  . Stress: Not at all  Relationships  . Social connections:    Talks on phone: Three times a week    Gets together: Three times a week    Attends religious service: More than 4 times per year    Active member  of club or organization: Yes    Attends meetings of clubs or organizations: More than 4 times per year    Relationship status: Widowed  . Intimate partner violence:    Fear of current or ex partner: No    Emotionally abused: No    Physically abused: No    Forced sexual activity: No  Other Topics Concern  . Not on file  Social History Narrative  . Not on file    FAMILY HISTORY:  Family History  Problem Relation Age of Onset  . Diabetes Mother   . Heart disease Mother   . Heart disease Father   . Colon cancer Sister   . Prostate cancer Brother   . Esophageal cancer Neg Hx   . Rectal cancer Neg Hx   . Stomach cancer Neg Hx     CURRENT MEDICATIONS:  Outpatient Encounter Medications as of 10/24/2017  Medication Sig Note  . aspirin EC 81 MG tablet Take 162 mg by mouth at bedtime. Takes 30 minutes prior to Niacin   . losartan (COZAAR) 25 MG tablet Take 25 mg by mouth daily.   . metoprolol succinate (TOPROL-XL) 25 MG 24 hr tablet Take 25 mg by mouth 2 (two) times daily.   .  niacin (NIASPAN) 500 MG CR tablet Take 500 mg by mouth every evening.  10/24/2017: afternoons  . omeprazole (PRILOSEC) 20 MG capsule Take 20 mg by mouth every evening.    . prochlorperazine (COMPAZINE) 10 MG tablet Take 1 tablet (10 mg total) by mouth every 6 (six) hours as needed for nausea or vomiting.   . simvastatin (ZOCOR) 20 MG tablet Take 20 mg by mouth every evening.    . traMADol (ULTRAM) 50 MG tablet Take 1 tablet (50 mg total) by mouth every 4 (four) hours as needed.   . zolpidem (AMBIEN) 5 MG tablet Take 1 tablet (5 mg total) by mouth at bedtime as needed for sleep.   . cyanocobalamin 500 MCG tablet Take 500 mcg by mouth daily. Vitamin B-12   . lidocaine-prilocaine (EMLA) cream Apply a quarter size amount to port site 1 hour prior to chemo. Do not rub in. Cover with plastic wrap. (Patient not taking: Reported on 10/24/2017)   . sucralfate (CARAFATE) 1 g tablet Crush and mix in 1 oz of water q AC and HS. (Patient not taking: Reported on 10/24/2017)   . [DISCONTINUED] ACETAMINOPHEN PO Take 650 mg by mouth every 8 (eight) hours as needed (for pain).    . [DISCONTINUED] HYDROcodone-acetaminophen (HYCET) 7.5-325 mg/15 ml solution Take 10-15 mLs by mouth 4 (four) times daily as needed for moderate pain.    No facility-administered encounter medications on file as of 10/24/2017.     ALLERGIES:  Allergies  Allergen Reactions  . Morphine And Related     Caused hallucinations.  . Oxycodone     Caused hallucinations     PHYSICAL EXAM:  ECOG Performance status: 1  Vitals:   10/24/17 0814 10/24/17 0821  BP: (!) 84/52   Pulse: 84   Resp: 20   Temp:  97.7 F (36.5 C)  SpO2: 100%    Filed Weights   10/24/17 0814  Weight: 177 lb 9.6 oz (80.6 kg)    Physical Exam   LABORATORY DATA:  I have reviewed the labs as listed.  CBC    Component Value Date/Time   WBC 13.4 (H) 10/24/2017 0933   RBC 3.99 (L) 10/24/2017 0933   HGB 13.1 10/24/2017 0933   HCT  40.3 10/24/2017 0933   PLT   10/24/2017 0933    PLATELET CLUMPS NOTED ON SMEAR, COUNT APPEARS ADEQUATE   MCV 101.0 (H) 10/24/2017 0933   MCH 32.8 10/24/2017 0933   MCHC 32.5 10/24/2017 0933   RDW 13.5 10/24/2017 0933   LYMPHSABS 1.1 10/24/2017 0933   MONOABS 1.2 10/24/2017 0933   EOSABS 0.1 10/24/2017 0933   BASOSABS 0.1 10/24/2017 0933   CMP Latest Ref Rng & Units 10/24/2017 09/20/2017 08/24/2017  Glucose 70 - 99 mg/dL 141(H) 111(H) 164(H)  BUN 8 - 23 mg/dL 17 16 20   Creatinine 0.61 - 1.24 mg/dL 0.93 0.87 0.87  Sodium 135 - 145 mmol/L 136 136 135  Potassium 3.5 - 5.1 mmol/L 4.8 3.9 3.8  Chloride 98 - 111 mmol/L 103 106 104  CO2 22 - 32 mmol/L 24 24 24   Calcium 8.9 - 10.3 mg/dL 9.0 9.1 9.0  Total Protein 6.5 - 8.1 g/dL 6.8 6.6 6.3(L)  Total Bilirubin 0.3 - 1.2 mg/dL 2.1(H) 0.6 0.4  Alkaline Phos 38 - 126 U/L 790(H) 125 67  AST 15 - 41 U/L 138(H) 33 21  ALT 0 - 44 U/L 174(H) 34 24          ASSESSMENT & PLAN:   Malignant melanoma (HCC) 1.  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 - Barium swallow done at [facility] showed a large mass in the distal esophagus. - PET/CT scan on 02/01/5206 hypermetabolic solid 3.5 x 2.3 cm esophageal mass in the mid to lower third of the thoracic esophagus with max SUV 7.3 and nonspecific mildly hypermetabolic nonenlarged bilateral hilar lymph nodes, indeterminate.  We discussed this with the patient in detail. - EGD on 05/26/2017 shows large fungating mass with no bleeding, 30 cm from incisors, extends to 35 cm, partially obstructing, biopsy consistent with squamous cell carcinoma. - Started on combination chemoradiation therapy with weekly carboplatin and paclitaxel on 06/27/2017.  He has developed worsening of difficulty swallowing.  He had a PEG tube placed on 07/05/2017 along with the Port-A-Cath on the same day.  He is taking and 1 can of Osmolite 1.5/day.  He is able to drink 3 ensures per day.  He is also  eating soft foods. - He finished last cycle of chemotherapy on 08/02/2017 and radiation on 08/05/2017. - PET/CT scan on 09/16/2017 shows decrease in size and metabolic activity of the primary lesion in the distal esophagus.   2.  Metastatic malignant melanoma: - Biopsy of the liver lesions surprisingly came back as malignant melanoma.  He never had a known history of melanoma resected. - We discussed the prognosis of metastatic malignant melanoma and treatment with palliative intent. - Foundation 1 testing shows MS-stable, TMB-low,NRAS Q61L, MYC amplification, PDL 1 TPS of 0% - We talked about combination immunotherapy with nivolumab and ipilimumab given every 3 weeks for 4 cycles followed by monthly nivolumab.  Response rates with a combination regimen is around 60% and 3-year survival of 70%.  We also talked about single agent nivolumab which has slightly low response rates of around 40% and 4-year survival about 50%.  We talked about the immunotherapy related side effects in detail. -Brain MRI was negative for metastatic disease. - As he is feeling weak, we will start out with single agent nivolumab and add on ipilimumab if he tolerates it well. -He is having intermittent nausea.  I have suggested him to take Compazine 2-3 times a day as a  standing order.  3.  Colorectal cancer: - Underwent segmental colon resection in November 2016, found to have a T3N0 adenocarcinoma, with circumferential margins positive.  Patient did not receive any neoadjuvant or adjuvant chemotherapy/radiation therapy.  4.  Prostate cancer: -Originally diagnosed in early 2000s, status post seed implants.  Underwent cystoprostatectomy in November 2016, pathology showed prostatic adenocarcinoma Gleason 8 (4+4).  5.  Pain: - He is complaining of worsening pain in the right loin region.  He is taking tramadol 2-3 times a day.  This is not very well controlled. -I have recommended getting a CT scan of the abdomen and  pelvis.  6.  Nutrition: -He is taking and Osmolite 2 cans daily.  Is also eating by mouth.  Weight has been stable.      Orders placed this encounter:  Orders Placed This Encounter  Procedures  . CT Abdomen Pelvis W Contrast  . CBC with Differential/Platelet  . Comprehensive metabolic panel  . CBC with Differential/Platelet  . Comprehensive metabolic panel  . TSH      Derek Jack, [place] [phone]

## 2017-10-26 ENCOUNTER — Other Ambulatory Visit (HOSPITAL_COMMUNITY): Payer: Self-pay | Admitting: Nurse Practitioner

## 2017-10-26 ENCOUNTER — Other Ambulatory Visit (HOSPITAL_COMMUNITY): Payer: PPO

## 2017-10-26 ENCOUNTER — Encounter (HOSPITAL_COMMUNITY): Payer: Self-pay

## 2017-10-26 ENCOUNTER — Inpatient Hospital Stay (HOSPITAL_COMMUNITY): Payer: PPO

## 2017-10-26 VITALS — BP 111/54 | HR 77 | Temp 97.9°F | Resp 18 | Wt 181.0 lb

## 2017-10-26 DIAGNOSIS — C155 Malignant neoplasm of lower third of esophagus: Secondary | ICD-10-CM

## 2017-10-26 DIAGNOSIS — Z5112 Encounter for antineoplastic immunotherapy: Secondary | ICD-10-CM | POA: Diagnosis not present

## 2017-10-26 DIAGNOSIS — C439 Malignant melanoma of skin, unspecified: Secondary | ICD-10-CM

## 2017-10-26 MED ORDER — SODIUM CHLORIDE 0.9 % IV SOLN
1.0000 mg/kg | Freq: Once | INTRAVENOUS | Status: AC
  Administered 2017-10-26: 81 mg via INTRAVENOUS
  Filled 2017-10-26: qty 8.1

## 2017-10-26 MED ORDER — SODIUM CHLORIDE 0.9% FLUSH
10.0000 mL | INTRAVENOUS | Status: DC | PRN
  Administered 2017-10-26: 10 mL
  Filled 2017-10-26: qty 10

## 2017-10-26 MED ORDER — SODIUM CHLORIDE 0.9 % IV SOLN
Freq: Once | INTRAVENOUS | Status: AC
  Administered 2017-10-26: 09:00:00 via INTRAVENOUS

## 2017-10-26 MED ORDER — DIPHENHYDRAMINE HCL 50 MG/ML IJ SOLN
25.0000 mg | Freq: Once | INTRAMUSCULAR | Status: AC
  Administered 2017-10-26: 25 mg via INTRAVENOUS
  Filled 2017-10-26: qty 1

## 2017-10-26 MED ORDER — FAMOTIDINE IN NACL 20-0.9 MG/50ML-% IV SOLN
20.0000 mg | Freq: Once | INTRAVENOUS | Status: AC
  Administered 2017-10-26: 20 mg via INTRAVENOUS
  Filled 2017-10-26: qty 50

## 2017-10-26 MED ORDER — INFLUENZA VAC SPLIT QUAD 0.5 ML IM SUSY
0.5000 mL | PREFILLED_SYRINGE | Freq: Once | INTRAMUSCULAR | Status: AC
  Administered 2017-10-26: 0.5 mL via INTRAMUSCULAR
  Filled 2017-10-26: qty 0.5

## 2017-10-26 MED ORDER — SODIUM CHLORIDE 0.9 % IV SOLN
3.1000 mg/kg | Freq: Once | INTRAVENOUS | Status: AC
  Administered 2017-10-26: 250 mg via INTRAVENOUS
  Filled 2017-10-26: qty 10

## 2017-10-26 MED ORDER — HEPARIN SOD (PORK) LOCK FLUSH 100 UNIT/ML IV SOLN
500.0000 [IU] | Freq: Once | INTRAVENOUS | Status: AC | PRN
  Administered 2017-10-26: 500 [IU]
  Filled 2017-10-26 (×2): qty 5

## 2017-10-26 NOTE — Patient Instructions (Addendum)
[facility] Discharge Instructions for Patients Receiving Chemotherapy   Beginning January 23rd 2017 lab work for the [facility] will be done in the  Main lab at [facility] on 1st floor. If you have a lab appointment with the [facility] please come in thru the  Main Entrance and check in at the main information desk   Today you received the following chemotherapy agents Opdivo and Yervoy as well as Influenza vaccine. Follow-up as scheduled. Call clinic for any questions or concerns  To help prevent nausea and vomiting after your treatment, we encourage you to take your nausea medication   If you develop nausea and vomiting, or diarrhea that is not controlled by your medication, call the clinic.  The clinic phone number is [phone]. Office hours are Monday-Friday 8:30am-5:00pm.  BELOW ARE SYMPTOMS THAT SHOULD BE REPORTED IMMEDIATELY:  *FEVER GREATER THAN 101.0 F  *CHILLS WITH OR WITHOUT FEVER  NAUSEA AND VOMITING THAT IS NOT CONTROLLED WITH YOUR NAUSEA MEDICATION  *UNUSUAL SHORTNESS OF BREATH  *UNUSUAL BRUISING OR BLEEDING  TENDERNESS IN MOUTH AND THROAT WITH OR WITHOUT PRESENCE OF ULCERS  *URINARY PROBLEMS  *BOWEL PROBLEMS  UNUSUAL RASH Items with * indicate a potential emergency and should be followed up as soon as possible. If you have an emergency after office hours please contact your primary care physician or go to the nearest emergency department.  Please call the clinic during office hours if you have any questions or concerns.   You may also contact the Patient Navigator at [phone] should you have any questions or need assistance in obtaining follow up care.      Resources For Cancer Patients and their Caregivers ? American Cancer Society: Can assist with transportation, wigs, general needs, runs Look Good Feel Better.        5406624324 ? Cancer Care: Provides financial assistance, online support groups,  medication/co-pay assistance.  1-800-813-HOPE 479-534-5374) ? [facility] Assists [place] cancer patients and their families through emotional , educational and financial support.  337-339-1657 ? [place] DSS Where to apply for food stamps, Medicaid and utility assistance. 952-639-7613 ? [facility]: Transportation to medical appointments. (804)162-8516 ? Social Security Administration: May apply for disability if have a Stage IV cancer. [phone] ? [employer], Disability and Transit Services: Assists with nutrition, care and transit needs. 610-474-8102

## 2017-10-26 NOTE — Progress Notes (Signed)
65 Labs reviewed with Dr. Delton Coombes and pt approved for chemo tx today per MD                       Talbert Forest tolerated chemo tx and Influenza vaccine well without complaints or incident.Chemotherapy education packet with drug-specific information given to pt and family and reviewed in detail. All questions were answered and consent obtained. VSS upon discharge. Pt discharged via wheelchair in satisfactory condition accompanied by his daughters

## 2017-10-27 ENCOUNTER — Telehealth (HOSPITAL_COMMUNITY): Payer: Self-pay

## 2017-10-27 NOTE — Telephone Encounter (Signed)
24 hr Chemo F/U call: Called pt's home, no answer so message left to call back for any questions or problems

## 2017-10-31 ENCOUNTER — Encounter (HOSPITAL_COMMUNITY): Payer: Self-pay | Admitting: *Deleted

## 2017-10-31 NOTE — Progress Notes (Signed)
Patient's daughter called clinic stating that her dad was weak, having nausea vomiting that isn't controlled, urine is now a red/bronze color. He is unable to take any more than 1-2 peg feedings per day. She states that he has been resting more and is extremely weak.    I have ordered labs for him in the morning and I have an appointment for him to see the physician.

## 2017-11-01 ENCOUNTER — Inpatient Hospital Stay (HOSPITAL_COMMUNITY)
Admission: AD | Admit: 2017-11-01 | Discharge: 2017-11-04 | DRG: 442 | Disposition: A | Discharge status: Skilled Nursing Facility | Payer: PPO | Source: Ambulatory Visit | Attending: Internal Medicine | Admitting: Internal Medicine

## 2017-11-01 ENCOUNTER — Encounter (HOSPITAL_COMMUNITY): Payer: Self-pay

## 2017-11-01 ENCOUNTER — Observation Stay (HOSPITAL_COMMUNITY): Payer: PPO

## 2017-11-01 ENCOUNTER — Other Ambulatory Visit (HOSPITAL_COMMUNITY): Payer: Self-pay | Admitting: Internal Medicine

## 2017-11-01 ENCOUNTER — Inpatient Hospital Stay (HOSPITAL_COMMUNITY): Payer: PPO

## 2017-11-01 ENCOUNTER — Encounter (HOSPITAL_COMMUNITY): Payer: Self-pay | Admitting: Internal Medicine

## 2017-11-01 ENCOUNTER — Other Ambulatory Visit: Payer: Self-pay

## 2017-11-01 ENCOUNTER — Inpatient Hospital Stay (HOSPITAL_COMMUNITY): Payer: PPO | Attending: Internal Medicine | Admitting: Internal Medicine

## 2017-11-01 VITALS — BP 127/66 | HR 89 | Temp 97.4°F | Resp 16

## 2017-11-01 DIAGNOSIS — Z9079 Acquired absence of other genital organ(s): Secondary | ICD-10-CM | POA: Diagnosis not present

## 2017-11-01 DIAGNOSIS — R1310 Dysphagia, unspecified: Secondary | ICD-10-CM

## 2017-11-01 DIAGNOSIS — R52 Pain, unspecified: Secondary | ICD-10-CM | POA: Diagnosis not present

## 2017-11-01 DIAGNOSIS — C787 Secondary malignant neoplasm of liver and intrahepatic bile duct: Secondary | ICD-10-CM | POA: Diagnosis present

## 2017-11-01 DIAGNOSIS — Z515 Encounter for palliative care: Secondary | ICD-10-CM | POA: Diagnosis not present

## 2017-11-01 DIAGNOSIS — I824Y1 Acute embolism and thrombosis of unspecified deep veins of right proximal lower extremity: Secondary | ICD-10-CM | POA: Diagnosis present

## 2017-11-01 DIAGNOSIS — I82411 Acute embolism and thrombosis of right femoral vein: Secondary | ICD-10-CM | POA: Diagnosis not present

## 2017-11-01 DIAGNOSIS — Z7189 Other specified counseling: Secondary | ICD-10-CM | POA: Diagnosis not present

## 2017-11-01 DIAGNOSIS — E871 Hypo-osmolality and hyponatremia: Secondary | ICD-10-CM | POA: Diagnosis not present

## 2017-11-01 DIAGNOSIS — Z23 Encounter for immunization: Secondary | ICD-10-CM | POA: Diagnosis not present

## 2017-11-01 DIAGNOSIS — C61 Malignant neoplasm of prostate: Secondary | ICD-10-CM

## 2017-11-01 DIAGNOSIS — Z86010 Personal history of colonic polyps: Secondary | ICD-10-CM

## 2017-11-01 DIAGNOSIS — N179 Acute kidney failure, unspecified: Secondary | ICD-10-CM | POA: Diagnosis present

## 2017-11-01 DIAGNOSIS — C155 Malignant neoplasm of lower third of esophagus: Secondary | ICD-10-CM | POA: Diagnosis not present

## 2017-11-01 DIAGNOSIS — C439 Malignant melanoma of skin, unspecified: Secondary | ICD-10-CM | POA: Diagnosis not present

## 2017-11-01 DIAGNOSIS — Z931 Gastrostomy status: Secondary | ICD-10-CM | POA: Diagnosis not present

## 2017-11-01 DIAGNOSIS — B179 Acute viral hepatitis, unspecified: Secondary | ICD-10-CM | POA: Diagnosis not present

## 2017-11-01 DIAGNOSIS — Z800 Family history of malignant neoplasm of digestive organs: Secondary | ICD-10-CM

## 2017-11-01 DIAGNOSIS — Z906 Acquired absence of other parts of urinary tract: Secondary | ICD-10-CM | POA: Diagnosis not present

## 2017-11-01 DIAGNOSIS — Z933 Colostomy status: Secondary | ICD-10-CM

## 2017-11-01 DIAGNOSIS — Z936 Other artificial openings of urinary tract status: Secondary | ICD-10-CM

## 2017-11-01 DIAGNOSIS — Z8042 Family history of malignant neoplasm of prostate: Secondary | ICD-10-CM

## 2017-11-01 DIAGNOSIS — Z9049 Acquired absence of other specified parts of digestive tract: Secondary | ICD-10-CM

## 2017-11-01 DIAGNOSIS — C20 Malignant neoplasm of rectum: Secondary | ICD-10-CM

## 2017-11-01 DIAGNOSIS — Z885 Allergy status to narcotic agent status: Secondary | ICD-10-CM

## 2017-11-01 DIAGNOSIS — Z5112 Encounter for antineoplastic immunotherapy: Secondary | ICD-10-CM | POA: Diagnosis not present

## 2017-11-01 DIAGNOSIS — R17 Unspecified jaundice: Secondary | ICD-10-CM | POA: Diagnosis not present

## 2017-11-01 DIAGNOSIS — Z6826 Body mass index (BMI) 26.0-26.9, adult: Secondary | ICD-10-CM | POA: Diagnosis not present

## 2017-11-01 DIAGNOSIS — E785 Hyperlipidemia, unspecified: Secondary | ICD-10-CM | POA: Diagnosis not present

## 2017-11-01 DIAGNOSIS — G479 Sleep disorder, unspecified: Secondary | ICD-10-CM

## 2017-11-01 DIAGNOSIS — Z66 Do not resuscitate: Secondary | ICD-10-CM | POA: Diagnosis present

## 2017-11-01 DIAGNOSIS — R1319 Other dysphagia: Secondary | ICD-10-CM

## 2017-11-01 DIAGNOSIS — Z85048 Personal history of other malignant neoplasm of rectum, rectosigmoid junction, and anus: Secondary | ICD-10-CM

## 2017-11-01 DIAGNOSIS — R110 Nausea: Secondary | ICD-10-CM

## 2017-11-01 DIAGNOSIS — I10 Essential (primary) hypertension: Secondary | ICD-10-CM | POA: Diagnosis present

## 2017-11-01 DIAGNOSIS — R627 Adult failure to thrive: Secondary | ICD-10-CM | POA: Diagnosis not present

## 2017-11-01 DIAGNOSIS — Z8546 Personal history of malignant neoplasm of prostate: Secondary | ICD-10-CM

## 2017-11-01 DIAGNOSIS — I82401 Acute embolism and thrombosis of unspecified deep veins of right lower extremity: Secondary | ICD-10-CM | POA: Diagnosis present

## 2017-11-01 DIAGNOSIS — R609 Edema, unspecified: Secondary | ICD-10-CM

## 2017-11-01 DIAGNOSIS — Z833 Family history of diabetes mellitus: Secondary | ICD-10-CM

## 2017-11-01 DIAGNOSIS — Z8249 Family history of ischemic heart disease and other diseases of the circulatory system: Secondary | ICD-10-CM

## 2017-11-01 DIAGNOSIS — Z923 Personal history of irradiation: Secondary | ICD-10-CM

## 2017-11-01 DIAGNOSIS — R935 Abnormal findings on diagnostic imaging of other abdominal regions, including retroperitoneum: Secondary | ICD-10-CM | POA: Diagnosis not present

## 2017-11-01 DIAGNOSIS — Z7982 Long term (current) use of aspirin: Secondary | ICD-10-CM

## 2017-11-01 DIAGNOSIS — Z79899 Other long term (current) drug therapy: Secondary | ICD-10-CM | POA: Diagnosis not present

## 2017-11-01 DIAGNOSIS — D72829 Elevated white blood cell count, unspecified: Secondary | ICD-10-CM | POA: Diagnosis not present

## 2017-11-01 DIAGNOSIS — I82431 Acute embolism and thrombosis of right popliteal vein: Secondary | ICD-10-CM | POA: Diagnosis present

## 2017-11-01 DIAGNOSIS — R7989 Other specified abnormal findings of blood chemistry: Secondary | ICD-10-CM

## 2017-11-01 DIAGNOSIS — R945 Abnormal results of liver function studies: Secondary | ICD-10-CM

## 2017-11-01 DIAGNOSIS — K219 Gastro-esophageal reflux disease without esophagitis: Secondary | ICD-10-CM | POA: Diagnosis present

## 2017-11-01 HISTORY — DX: Acute kidney failure, unspecified: N17.9

## 2017-11-01 HISTORY — DX: Elevated white blood cell count, unspecified: D72.829

## 2017-11-01 HISTORY — DX: Acute viral hepatitis, unspecified: B17.9

## 2017-11-01 HISTORY — DX: Unspecified jaundice: R17

## 2017-11-01 LAB — COMPREHENSIVE METABOLIC PANEL
ALK PHOS: 929 U/L — AB (ref 38–126)
ALT: 167 U/L — AB (ref 0–44)
ANION GAP: 14 (ref 5–15)
AST: 216 U/L — ABNORMAL HIGH (ref 15–41)
Albumin: 2.7 g/dL — ABNORMAL LOW (ref 3.5–5.0)
BUN: 42 mg/dL — ABNORMAL HIGH (ref 8–23)
CALCIUM: 8.6 mg/dL — AB (ref 8.9–10.3)
CO2: 18 mmol/L — AB (ref 22–32)
CREATININE: 1.34 mg/dL — AB (ref 0.61–1.24)
Chloride: 94 mmol/L — ABNORMAL LOW (ref 98–111)
GFR, EST AFRICAN AMERICAN: 54 mL/min — AB (ref >60)
GFR, EST NON AFRICAN AMERICAN: 47 mL/min — AB (ref >60)
Glucose, Bld: 130 mg/dL — ABNORMAL HIGH (ref 70–99)
Potassium: 4.3 mmol/L (ref 3.5–5.1)
Sodium: 126 mmol/L — ABNORMAL LOW (ref 135–145)
TOTAL PROTEIN: 6.3 g/dL — AB (ref 6.5–8.1)
Total Bilirubin: 10.6 mg/dL — ABNORMAL HIGH (ref 0.3–1.2)

## 2017-11-01 LAB — CBC WITH DIFFERENTIAL/PLATELET
ABS IMMATURE GRANULOCYTES: 0.54 10*3/uL — AB (ref 0.00–0.07)
Basophils Absolute: 0.1 10*3/uL (ref 0.0–0.1)
Basophils Relative: 0 %
EOS PCT: 0 %
Eosinophils Absolute: 0.1 10*3/uL (ref 0.0–0.5)
HEMATOCRIT: 37.8 % — AB (ref 39.0–52.0)
HEMOGLOBIN: 12.9 g/dL — AB (ref 13.0–17.0)
Immature Granulocytes: 3 %
LYMPHS PCT: 4 %
Lymphs Abs: 0.8 10*3/uL (ref 0.7–4.0)
MCH: 31.9 pg (ref 26.0–34.0)
MCHC: 34.1 g/dL (ref 30.0–36.0)
MCV: 93.6 fL (ref 80.0–100.0)
MONO ABS: 1.5 10*3/uL — AB (ref 0.1–1.0)
Monocytes Relative: 8 %
NEUTROS ABS: 17.4 10*3/uL — AB (ref 1.7–7.7)
Neutrophils Relative %: 85 %
Platelets: 171 10*3/uL (ref 150–400)
RBC: 4.04 MIL/uL — ABNORMAL LOW (ref 4.22–5.81)
RDW: 15.7 % — ABNORMAL HIGH (ref 11.5–15.5)
WBC: 20.3 10*3/uL — AB (ref 4.0–10.5)
nRBC: 0 % (ref 0.0–0.2)

## 2017-11-01 LAB — PROTIME-INR
INR: 1.13
PROTHROMBIN TIME: 14.4 s (ref 11.4–15.2)

## 2017-11-01 MED ORDER — GADOBUTROL 1 MMOL/ML IV SOLN
7.5000 mL | Freq: Once | INTRAVENOUS | Status: AC | PRN
  Administered 2017-11-01: 7.5 mL via INTRAVENOUS

## 2017-11-01 MED ORDER — METOPROLOL SUCCINATE ER 25 MG PO TB24
25.0000 mg | ORAL_TABLET | Freq: Two times a day (BID) | ORAL | Status: DC
  Administered 2017-11-01 – 2017-11-04 (×6): 25 mg via ORAL
  Filled 2017-11-01 (×6): qty 1

## 2017-11-01 MED ORDER — LOSARTAN POTASSIUM 50 MG PO TABS: 25.0000 mg | ORAL_TABLET | Freq: Every day | ORAL | Status: DC

## 2017-11-01 MED ORDER — PANTOPRAZOLE SODIUM 40 MG PO TBEC
40.0000 mg | DELAYED_RELEASE_TABLET | Freq: Every day | ORAL | Status: DC
  Administered 2017-11-01 – 2017-11-04 (×4): 40 mg via ORAL
  Filled 2017-11-01 (×4): qty 1

## 2017-11-01 MED ORDER — TRAMADOL HCL 50 MG PO TABS
50.0000 mg | ORAL_TABLET | Freq: Four times a day (QID) | ORAL | Status: DC | PRN
  Administered 2017-11-01 – 2017-11-04 (×9): 50 mg via ORAL
  Filled 2017-11-01 (×9): qty 1

## 2017-11-01 MED ORDER — METOPROLOL SUCCINATE ER 25 MG PO TB24: 25.0000 mg | ORAL_TABLET | Freq: Two times a day (BID) | ORAL | Status: DC

## 2017-11-01 MED ORDER — SODIUM CHLORIDE 0.9 % IV SOLN
INTRAVENOUS | Status: DC
  Administered 2017-11-01 – 2017-11-04 (×5): via INTRAVENOUS

## 2017-11-01 MED ORDER — PROMETHAZINE HCL 25 MG/ML IJ SOLN
25.0000 mg | Freq: Once | INTRAMUSCULAR | Status: AC
  Administered 2017-11-01: 25 mg via INTRAVENOUS
  Filled 2017-11-01: qty 1

## 2017-11-01 MED ORDER — SODIUM CHLORIDE 0.9 % IV SOLN
INTRAVENOUS | Status: DC
  Administered 2017-11-01: 10:00:00 via INTRAVENOUS

## 2017-11-01 MED ORDER — PROCHLORPERAZINE MALEATE 5 MG PO TABS: 10.0000 mg | ORAL_TABLET | Freq: Four times a day (QID) | ORAL | Status: DC | PRN

## 2017-11-01 MED ORDER — SIMVASTATIN 20 MG PO TABS: 20.0000 mg | ORAL_TABLET | Freq: Every evening | ORAL | Status: DC

## 2017-11-01 MED ORDER — METHYLPREDNISOLONE SODIUM SUCC 125 MG IJ SOLR
60.0000 mg | Freq: Four times a day (QID) | INTRAMUSCULAR | Status: DC
  Administered 2017-11-01 – 2017-11-03 (×8): 60 mg via INTRAVENOUS
  Filled 2017-11-01 (×8): qty 2

## 2017-11-01 MED ORDER — ONDANSETRON HCL 4 MG/2ML IJ SOLN: 4.0000 mg | Freq: Four times a day (QID) | INTRAMUSCULAR | Status: DC | PRN

## 2017-11-01 MED ORDER — FENTANYL CITRATE (PF) 100 MCG/2ML IJ SOLN: 12.5000 ug | INTRAMUSCULAR | Status: DC | PRN

## 2017-11-01 MED ORDER — ZOLPIDEM TARTRATE 5 MG PO TABS
5.0000 mg | ORAL_TABLET | Freq: Every evening | ORAL | Status: DC | PRN
  Administered 2017-11-03: 5 mg via ORAL
  Filled 2017-11-01: qty 1

## 2017-11-01 MED ORDER — ONDANSETRON HCL 4 MG PO TABS: 4.0000 mg | ORAL_TABLET | Freq: Four times a day (QID) | ORAL | Status: DC | PRN

## 2017-11-01 NOTE — Progress Notes (Signed)
Late entry- patient seen by Dr. Walden Field today for symptom management. Will give hydration fluids and antiemetics per MD.

## 2017-11-01 NOTE — Plan of Care (Signed)
Spoke with family(daughter X2: DEBRA UNDERWOOD). Explained poor Prognosis due to melanoma replacing liver. AGREE WITH STEROIDS FOR 24 HRS TO SEE IF LFTS IMPROVE. SUPPORTIVE CARE.

## 2017-11-01 NOTE — Progress Notes (Signed)
11/01/2017 4:08 PM  I spoke with Dr. Walden Field and we reviewed the MRCP. She recommended starting high dose steroids and we will recheck liver enzymes tomorrow.  She is planning to see patient tomorrow.  I spoke with patient's family and updated them on the results and the plan of care.  They verbalized understanding.   Dr. Walden Field recommended solumedrol 60 mg IV every 6 hours, first dose to be given now.    Murvin Natal MD

## 2017-11-01 NOTE — Progress Notes (Signed)
Report given to Rodman Key RN from 3rd floor regarding pt's history and present condition. Pt transferred to 3rd floor via wheelchair in stable condition to room 312 and assisted pt in the bed with side rails up x 4 and family at bedside.

## 2017-11-01 NOTE — Consult Note (Addendum)
Referring Provider: Dr. Wynetta Emery  Primary Care Physician:  Redmond School, MD Primary Gastroenterologist:  Dr. Carlean Purl  Date of Admission: 11/01/17 Date of Consultation: 11/01/17  Reason for Consultation:  Jaundice  HPI:  Ian Christensen is an 84 y.o. year old male with history of squamous cell carcinoma of distal esophagus in 2019, history of colorectal cancer in Nov 2016 s/p segmental colon resection, porstate cancer in the early 2000s s/p prostatectomy Nov 2016, now with biopsy proven metastatic malignant melanoma.   He was seen by Dr. Walden Field today with Oncology with complaints of jaundice, dark urine, flank pain, and nausea. Bilirubin 10.6, AST 216, ALT 167, alk phos 929. MRCP today with marked progression of diffuse hepatic metastatic disease in comparison with PET-CT Sep 16, 2017. Liver markedly enlarged and nearly completely replaced by tumor. Steroids recommended by Oncology with monitoring of any improvement in LFTs.   He has become progressively weak, only taking 1 can of tube feeding daily and minimal liquid per mouth. Drowsy during consultation today. Both daughters at beside and granddaughter. Right flank pain noted. Feels weak. Tired. No vomiting.   Past Medical History:  Diagnosis Date  . Acute hepatitis 11/01/2017  . AKI (acute kidney injury) ([place]) 11/01/2017  . Arthritis   . Colostomy in place [facility])   . Esophageal cancer ([place])   . GERD (gastroesophageal reflux disease)   . History of colon polyps   . History of hiatal hernia   . 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 HTN (hypertension)    followed by pcp  . Hyperlipidemia   . Jaundice 11/01/2017  . Leukocytosis 11/01/2017  . Presence of urostomy ([place])   . Rectal cancer ([place]) 10/07/2014   no chemo or radiation/ bladder was removed also  . Tinnitus    per pt intermittant   . Wears glasses     Past  Surgical History:  Procedure Laterality Date  . APPENDECTOMY  1955  . COLONOSCOPY    . CYSTO N/A 12/11/2014   Procedure:  CYSTO,  PROSTATECTOMY, CYSTECTOMY colon conduit, BILATERAL LYMPH NODE DISSECTION;  Surgeon: Alexis Frock, MD;  Location: [facility];  Service: Urology;  Laterality: N/A;  . ESOPHAGOGASTRODUODENOSCOPY (EGD) WITH PROPOFOL N/A 07/05/2017   Procedure: ESOPHAGOGASTRODUODENOSCOPY (EGD) WITH PROPOFOL;  Surgeon: Aviva Signs, MD;  Location: [facility];  Service: General;  Laterality: N/A;  . EUS N/A 10/10/2014   Procedure: LOWER ENDOSCOPIC ULTRASOUND (EUS);  Surgeon: Milus Banister, MD;  Location: [facility];  Service: Endoscopy;  Laterality: N/A;  . HERNIA REPAIR    . INCISIONAL HERNIA REPAIR N/A 03/02/2017   Procedure: INCISIONAL HERNIA REPAIR WITH MESH;  Surgeon: Leighton Ruff, MD;  Location: [facility];  Service: General;  Laterality: N/A;  . INSERTION OF MESH  03/02/2017   Procedure: INSERTION OF MESH;  Surgeon: Leighton Ruff, MD;  Location: [facility];  Service: General;;  . LAPAROSCOPIC INTERNAL HERNIA REPAIR N/A 03/06/2017   Procedure: REOPENING OF RECENT LAPAROTOMY WITH CLOSURE OF INCISIONAL HERNIA WITH MESH;  Surgeon: Leighton Ruff, MD;  Location: [facility];  Service: General;  Laterality: N/A;  . PEG PLACEMENT N/A 07/05/2017   Procedure: PERCUTANEOUS ENDOSCOPIC GASTROSTOMY (PEG) PLACEMENT;  Surgeon: Aviva Signs, MD;  Location: [facility];  Service: General;  Laterality: N/A;  . PORTACATH PLACEMENT Left 07/05/2017   Procedure: INSERTION PORT WITH ATTACHED CATHETER IN LEFT SUBCLAVIAN;  Surgeon: Aviva Signs, MD;  Location: [facility];  Service: General;  Laterality: Left;  . RADIOACTIVE PROSTATE SEED IMPLANTS  02-06-1999  dr Jeffie Pollock  [facility]  . XI ROBOTIC ASSISTED LOWER ANTERIOR RESECTION N/A 12/11/2014   Procedure: XI ROBOTIC ASSISTED ABDOMINAL PERINEAL RESECTION OMENTOPLASTY;  Surgeon: Leighton Ruff, MD;  Location: [facility];  Service: General;  Laterality: N/A;    Prior to Admission medications    Medication Sig Start Date End Date Taking? Authorizing Provider  aspirin EC 81 MG tablet Take 162 mg by mouth at bedtime. Takes 30 minutes prior to Niacin    [provider]  cyanocobalamin 500 MCG tablet Take 500 mcg by mouth daily. Vitamin B-12    [provider]  Ipilimumab (YERVOY IV) Inject into the vein.    [provider]  lidocaine-prilocaine (EMLA) cream Apply a quarter size amount to port site 1 hour prior to chemo. Do not rub in. Cover with plastic wrap. Patient not taking: Reported on 10/24/2017 06/24/17   Derek Jack, MD  losartan (COZAAR) 25 MG tablet Take 25 mg by mouth daily.    [provider]  metoprolol succinate (TOPROL-XL) 25 MG 24 hr tablet Take 25 mg by mouth 2 (two) times daily.    [provider]  niacin (NIASPAN) 500 MG CR tablet Take 500 mg by mouth every evening.  01/29/17   [provider]  Nivolumab (OPDIVO IV) Inject into the vein.    [provider]  omeprazole (PRILOSEC) 20 MG capsule Take 20 mg by mouth every evening.  07/23/14   [provider]  prochlorperazine (COMPAZINE) 10 MG tablet Take 1 tablet (10 mg total) by mouth every 6 (six) hours as needed for nausea or vomiting. 06/24/17   Derek Jack, MD  simvastatin (ZOCOR) 20 MG tablet Take 20 mg by mouth every evening.  12/29/15   [provider]  sucralfate (CARAFATE) 1 g tablet Crush and mix in 1 oz of water q AC and HS. Patient not taking: Reported on 10/24/2017 07/05/17   Hayden Pedro, PA-C  traMADol (ULTRAM) 50 MG tablet TAKE (1) TABLET BY MOUTH EVERY 4 HOURS AS NEEDED. 10/26/17   Lockamy, Randi L, NP-C  zolpidem (AMBIEN) 5 MG tablet Take 1 tablet (5 mg total) by mouth at bedtime as needed for sleep. 10/14/17 11/13/17  Glennie Isle, NP-C    Current Facility-Administered Medications  Medication Dose Route Frequency Provider Last Rate Last Dose  . 0.9 %  sodium chloride infusion   Intravenous Continuous  Johnson, Clanford L, MD 75 mL/hr at 11/01/17 1516    . fentaNYL (SUBLIMAZE) injection 12.5-25 mcg  12.5-25 mcg Intravenous Q2H PRN Johnson, Clanford L, MD      . methylPREDNISolone sodium succinate (SOLU-MEDROL) 125 mg/2 mL injection 60 mg  60 mg Intravenous Q6H Johnson, Clanford L, MD      . metoprolol succinate (TOPROL-XL) 24 hr tablet 25 mg  25 mg Oral BID Johnson, Clanford L, MD      . ondansetron (ZOFRAN) tablet 4 mg  4 mg Oral Q6H PRN Johnson, Clanford L, MD       Or  . ondansetron (ZOFRAN) injection 4 mg  4 mg Intravenous Q6H PRN Johnson, Clanford L, MD      . pantoprazole (PROTONIX) EC tablet 40 mg  40 mg Oral Daily Johnson, Clanford L, MD   40 mg at 11/01/17 1520  . prochlorperazine (COMPAZINE) tablet 10 mg  10 mg Oral Q6H PRN Murlean Iba, MD      .  traMADol (ULTRAM) tablet 50 mg  50 mg Oral Q6H PRN Johnson, Clanford L, MD   50 mg at 11/01/17 1521  . zolpidem (AMBIEN) tablet 5 mg  5 mg Oral QHS PRN Murlean Iba, MD        Allergies as of 11/01/2017 - Review Complete 11/01/2017  Allergen Reaction Noted  . Morphine and related  12/23/2015  . Oxycodone  12/23/2015    Family History  Problem Relation Age of Onset  . Diabetes Mother   . Heart disease Mother   . Heart disease Father   . Colon cancer Sister   . Prostate cancer Brother   . Esophageal cancer Neg Hx   . Rectal cancer Neg Hx   . Stomach cancer Neg Hx     Social History   Socioeconomic History  . Marital status: Widowed    Spouse name: Not on file  . Number of children: 2  . Years of education: Not on file  . Highest education level: Not on file  Occupational History  . Occupation: retired  Scientific laboratory technician  . Financial resource strain: Not very hard  . Food insecurity:    Worry: Never true    Inability: Never true  . Transportation needs:    Medical: No    Non-medical: No  Tobacco Use  . Smoking status: Never Smoker  . Smokeless tobacco: Never Used  Substance and Sexual Activity  .  Alcohol use: Yes    Alcohol/week: 0.0 standard drinks    Comment: seldom  . Drug use: No  . Sexual activity: Not Currently    Comment: Widowed  Lifestyle  . Physical activity:    Days per week: 1 day    Minutes per session: 30 min  . Stress: Not at all  Relationships  . Social connections:    Talks on phone: Three times a week    Gets together: Three times a week    Attends religious service: More than 4 times per year    Active member of club or organization: Yes    Attends meetings of clubs or organizations: More than 4 times per year    Relationship status: Widowed  . Intimate partner violence:    Fear of current or ex partner: No    Emotionally abused: No    Physically abused: No    Forced sexual activity: No  Other Topics Concern  . Not on file  Social History Narrative  . Not on file    Review of Systems: As mentioned in HPI   Physical Exam: Vital signs in last 24 hours: Temp:  [97.4 F (36.3 C)] 97.4 F (36.3 C) (10/15 1208) Pulse Rate:  [89] 89 (10/15 1208) Resp:  [16] 16 (10/15 1208) BP: (127)/(66) 127/66 (10/15 1208) SpO2:  [97 %] 97 % (10/15 1208) Weight:  [81.1 kg] 81.1 kg (10/15 1210)   General:   Alert,  Chronically ill appearing, jaundiced, drowsy Head:  Normocephalic and atraumatic. Eyes:  +scleral icterus  Ears:  Normal auditory acuity. Nose:  No deformity, discharge,  or lesions. Mouth:  No deformity or lesions, dentition normal. Lungs:  Clear throughout to auscultation.   Heart:  S1 S2 present with murmur Abdomen:  Soft, nontender and nondistended. PEG tube intact. Ostomies in place, intact Rectal:  Deferred  Extremities:  Left extremity without edema, RLE with 1+pitting edema and ankle edema  Neurologic:  Alert and  oriented x4 Psych:  Alert and cooperative. Normal mood and affect.  Intake/Output from previous  day: No intake/output data recorded. Intake/Output this shift: No intake/output data recorded.  Lab Results: Recent Labs     11/01/17 0836  WBC 20.3*  HGB 12.9*  HCT 37.8*  PLT 171   BMET Recent Labs    11/01/17 0836  NA 126*  K 4.3  CL 94*  CO2 18*  GLUCOSE 130*  BUN 42*  CREATININE 1.34*  CALCIUM 8.6*   LFT Recent Labs    11/01/17 0836  PROT 6.3*  ALBUMIN 2.7*  AST 216*  ALT 167*  ALKPHOS 929*  BILITOT 10.6*   PT/INR Recent Labs    11/01/17 1459  LABPROT 14.4  INR 1.13    Studies/Results: Mr 3d Recon At Scanner  Result Date: 11/01/2017 CLINICAL DATA:  Jaundice with left flank pain, generalized malaise and increasing weakness. History of squamous cell esophageal cancer, rectal cancer and remote prostate cancer. Undergoing immunotherapy. EXAM: MRI ABDOMEN WITHOUT AND WITH CONTRAST (INCLUDING MRCP) TECHNIQUE: Multiplanar multisequence MR imaging of the abdomen was performed both before and after the administration of intravenous contrast. Heavily T2-weighted images of the biliary and pancreatic ducts were obtained, and three-dimensional MRCP images were rendered by post processing. CONTRAST:  7.5 cc Gadavist COMPARISON:  Abdominopelvic CT 03/05/2017.  PET-CT 09/16/2017. FINDINGS: Despite efforts by the technologist and patient, mild motion artifact is present on today's exam and could not be eliminated. This reduces exam sensitivity and specificity. Lower chest: There are small bilateral pleural effusions with mild dependent atelectasis at both lung bases. Hepatobiliary: Interval marked enlargement of the liver which measures approximately 25.2 x 16.5 x 29.4 cm. There are innumerable hepatic lesions which have markedly progressed compared with the PET-CT of 6 weeks ago. Most of the lesions demonstrate low T1 signal, although there are a few lesions with intrinsic T1 shortening. There is heterogeneous T2 signal with areas of hyperintensity. Following contrast, some of the lesions demonstrate heterogeneous peripheral enhancement. Many of the lesions appear necrotic without central enhancement. The  vast majority of the hepatic parenchyma is replaced by tumor. The gallbladder appears contracted and is not well visualized. There is no extrahepatic biliary dilatation. Pancreas: Unremarkable. No pancreatic ductal dilatation or surrounding inflammatory changes. Spleen: Normal in size without focal abnormality. Adrenals/Urinary Tract: Both adrenal glands appear normal. There is a simple cyst measuring 2.8 cm in the upper pole the left kidney. No evidence of enhancing renal mass or hydronephrosis. Stomach/Bowel: No evidence of bowel wall thickening, distention or surrounding inflammatory change. Vascular/Lymphatic: There are multiple enlarged lymph nodes within the porta hepatis and upper retroperitoneum, most obvious on the diffusion-weighted images. These were hypermetabolic on previous PET-CT. No acute vascular findings identified. Other: Small amount of perihepatic ascites. Musculoskeletal: Although the marrow appears heterogeneous on diffusion weighted imaging, no focal lesions or abnormal enhancement identified within the spine on T2 weighted or post-contrast imaging. A simple lipoma is noted posterior to the left scapular tip. IMPRESSION: 1. Marked progression of diffuse hepatic metastatic disease compared with PET-CT of 09/16/2017. The liver is markedly enlarged and nearly completely replaced by tumor. 2. Associated metastatic lymphadenopathy in the porta hepatis and upper retroperitoneum. 3. No evidence of biliary dilatation. The gallbladder is contracted and not well visualized. Jaundice is likely secondary to intrahepatic cholestasis. 4. Small bilateral pleural effusions and mild ascites. Electronically Signed   By: Richardean Sale M.D.   On: 11/01/2017 14:42   Dg Chest Port 1 View  Result Date: 11/01/2017 CLINICAL DATA:  Acute hepatitis. EXAM: PORTABLE CHEST 1 VIEW COMPARISON:  Chest x-ray  dated July 05, 2017. FINDINGS: Unchanged left chest wall port catheter with tip in the mid SVC. The heart size  and mediastinal contours are within normal limits. Normal pulmonary vascularity. No focal consolidation, pleural effusion, or pneumothorax. No acute osseous abnormality. IMPRESSION: No active disease. Electronically Signed   By: Titus Dubin M.D.   On: 11/01/2017 13:40   Mr Abdomen Mrcp Moise Boring Contast  Result Date: 11/01/2017 CLINICAL DATA:  Jaundice with left flank pain, generalized malaise and increasing weakness. History of squamous cell esophageal cancer, rectal cancer and remote prostate cancer. Undergoing immunotherapy. EXAM: MRI ABDOMEN WITHOUT AND WITH CONTRAST (INCLUDING MRCP) TECHNIQUE: Multiplanar multisequence MR imaging of the abdomen was performed both before and after the administration of intravenous contrast. Heavily T2-weighted images of the biliary and pancreatic ducts were obtained, and three-dimensional MRCP images were rendered by post processing. CONTRAST:  7.5 cc Gadavist COMPARISON:  Abdominopelvic CT 03/05/2017.  PET-CT 09/16/2017. FINDINGS: Despite efforts by the technologist and patient, mild motion artifact is present on today's exam and could not be eliminated. This reduces exam sensitivity and specificity. Lower chest: There are small bilateral pleural effusions with mild dependent atelectasis at both lung bases. Hepatobiliary: Interval marked enlargement of the liver which measures approximately 25.2 x 16.5 x 29.4 cm. There are innumerable hepatic lesions which have markedly progressed compared with the PET-CT of 6 weeks ago. Most of the lesions demonstrate low T1 signal, although there are a few lesions with intrinsic T1 shortening. There is heterogeneous T2 signal with areas of hyperintensity. Following contrast, some of the lesions demonstrate heterogeneous peripheral enhancement. Many of the lesions appear necrotic without central enhancement. The vast majority of the hepatic parenchyma is replaced by tumor. The gallbladder appears contracted and is not well visualized.  There is no extrahepatic biliary dilatation. Pancreas: Unremarkable. No pancreatic ductal dilatation or surrounding inflammatory changes. Spleen: Normal in size without focal abnormality. Adrenals/Urinary Tract: Both adrenal glands appear normal. There is a simple cyst measuring 2.8 cm in the upper pole the left kidney. No evidence of enhancing renal mass or hydronephrosis. Stomach/Bowel: No evidence of bowel wall thickening, distention or surrounding inflammatory change. Vascular/Lymphatic: There are multiple enlarged lymph nodes within the porta hepatis and upper retroperitoneum, most obvious on the diffusion-weighted images. These were hypermetabolic on previous PET-CT. No acute vascular findings identified. Other: Small amount of perihepatic ascites. Musculoskeletal: Although the marrow appears heterogeneous on diffusion weighted imaging, no focal lesions or abnormal enhancement identified within the spine on T2 weighted or post-contrast imaging. A simple lipoma is noted posterior to the left scapular tip. IMPRESSION: 1. Marked progression of diffuse hepatic metastatic disease compared with PET-CT of 09/16/2017. The liver is markedly enlarged and nearly completely replaced by tumor. 2. Associated metastatic lymphadenopathy in the porta hepatis and upper retroperitoneum. 3. No evidence of biliary dilatation. The gallbladder is contracted and not well visualized. Jaundice is likely secondary to intrahepatic cholestasis. 4. Small bilateral pleural effusions and mild ascites. Electronically Signed   By: Richardean Sale M.D.   On: 11/01/2017 14:42    Impression: 84 year old male with multiple medical issues, interesting history of multiple prior cancers, now with biopsy proven malignant melanoma, presenting with failure to thrive, weakness, and jaundice. Elevated LFTs secondary to diffuse hepatic metastatic disease that has progressed since Sep 16, 2017. Acute hepatitis panel is pending. Oncology has recommend  steroids with repeat LFTs in the morning. Unfortunately, limited options and supportive care recommended. Palliative consult has been placed.  RLE edema: 1-2+ pitting right  lower extremity edema to knee. Will order duplex ultrasound of lower extremity. Discussed with Dr. Wynetta Emery.   Plan: Supportive care Continue IV steroids Repeat HFP in am Palliative consultation Duplex ultrasound ordered of right lower extremity to assess for DVT   Annitta Needs, PhD, ANP-BC [facility]    LOS: 1 day    11/01/2017, 5:11 PM

## 2017-11-01 NOTE — H&P (Signed)
History and Physical  Ian Christensen:[id number] DOB: [birth year] DOA: 11/01/2017  Referring physician: Melton Alar MD PCP: Elfredia Nevins, MD   Chief Complaint: abnormal labs   Historian: History from family members and Dr. Melton Alar, patient unable to participate  HPI: Ian Christensen is a 84 y.o. 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 They obtained some labs in the office today and notably patient had an elevated bilirubin of 10.6 and elevated LFTs. He had an acute kidney injury and worsened leukocytosis. He has has poor oral intake over the past week according to family members since he had his first immunotherapy treatment approximately 1 week ago.  He has gone downhill since his first treatment.  Oncology was concerned that the patient may be having an acute hepatitis from the treatment and /or could be having an obstruction that could require an ERCP.  They requested admission for IV fluids, supportive care and GI consultation.  They also requested an acute hepatitis work up be completed.  Dr. Melton Alar spoke with Dr. Darrick Penna and an MRCP is being ordered.    Review of Systems: Unable to obtain due to patient's condition   Past Medical History:  Diagnosis Date  . Acute hepatitis 11/01/2017  . AKI (acute kidney injury) (HCC) 11/01/2017  . Arthritis   . Colostomy in place [facility])   . Esophageal cancer (HCC)   . GERD (gastroesophageal reflux disease)   . History of colon polyps   . History of hiatal hernia   . 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 HTN (hypertension)    followed by pcp  . Hyperlipidemia   . Jaundice 11/01/2017  . Leukocytosis 11/01/2017  . Presence of urostomy (HCC)   . Rectal cancer (HCC)  10/07/2014   no chemo or radiation/ bladder was removed also  . Tinnitus    per pt intermittant   . Wears glasses    Past Surgical History:  Procedure Laterality Date  . APPENDECTOMY  1955  . COLONOSCOPY    . CYSTO N/A 12/11/2014   Procedure:  CYSTO,  PROSTATECTOMY, CYSTECTOMY colon conduit, BILATERAL LYMPH NODE DISSECTION;  Surgeon: Sebastian Ache, MD;  Location: [facility];  Service: Urology;  Laterality: N/A;  . ESOPHAGOGASTRODUODENOSCOPY (EGD) WITH PROPOFOL N/A 07/05/2017   Procedure: ESOPHAGOGASTRODUODENOSCOPY (EGD) WITH PROPOFOL;  Surgeon: Franky Macho, MD;  Location: [facility];  Service: General;  Laterality: N/A;  . EUS N/A 10/10/2014   Procedure: LOWER ENDOSCOPIC ULTRASOUND (EUS);  Surgeon: Rachael Fee, MD;  Location: [facility];  Service: Endoscopy;  Laterality: N/A;  . HERNIA REPAIR    . INCISIONAL HERNIA REPAIR N/A 03/02/2017   Procedure: INCISIONAL HERNIA REPAIR WITH MESH;  Surgeon: Romie Levee, MD;  Location: [facility];  Service: General;  Laterality: N/A;  . INSERTION OF MESH  03/02/2017   Procedure: INSERTION OF MESH;  Surgeon: Romie Levee, MD;  Location: [facility];  Service: General;;  . LAPAROSCOPIC INTERNAL HERNIA REPAIR N/A 03/06/2017   Procedure: REOPENING OF RECENT LAPAROTOMY WITH CLOSURE OF INCISIONAL HERNIA WITH MESH;  Surgeon: Romie Levee, MD;  Location: [facility];  Service: General;  Laterality: N/A;  . PEG PLACEMENT N/A 07/05/2017   Procedure: PERCUTANEOUS ENDOSCOPIC GASTROSTOMY (PEG) PLACEMENT;  Surgeon: Franky Macho, MD;  Location: [facility];  Service: General;  Laterality: N/A;  . PORTACATH PLACEMENT Left 07/05/2017   Procedure: INSERTION PORT WITH ATTACHED CATHETER IN LEFT SUBCLAVIAN;  Surgeon: Franky Macho, MD;  Location: [facility];  Service: General;  Laterality: Left;  . RADIOACTIVE PROSTATE SEED IMPLANTS  02-06-1999  dr Annabell Howells  [facility]  . XI ROBOTIC ASSISTED LOWER ANTERIOR RESECTION N/A 12/11/2014   Procedure: XI ROBOTIC ASSISTED ABDOMINAL PERINEAL RESECTION  OMENTOPLASTY;  Surgeon: Romie Levee, MD;  Location: [facility];  Service: General;  Laterality: N/A;   Social History:  reports that he has never smoked. He has never used smokeless tobacco. He reports that he drinks alcohol. He reports that he does not use drugs.  Allergies  Allergen Reactions  . Morphine And Related     Caused hallucinations.  . Oxycodone     Caused hallucinations    Family History  Problem Relation Age of Onset  . Diabetes Mother   . Heart disease Mother   . Heart disease Father   . Colon cancer Sister   . Prostate cancer Brother   . Esophageal cancer Neg Hx   . Rectal cancer Neg Hx   . Stomach cancer Neg Hx     Prior to Admission medications   Medication Sig Start Date End Date Taking? Authorizing Provider  aspirin EC 81 MG tablet Take 162 mg by mouth at bedtime. Takes 30 minutes prior to Niacin    [provider]  cyanocobalamin 500 MCG tablet Take 500 mcg by mouth daily. Vitamin B-12    [provider]  Ipilimumab (YERVOY IV) Inject into the vein.    [provider]  lidocaine-prilocaine (EMLA) cream Apply a quarter size amount to port site 1 hour prior to chemo. Do not rub in. Cover with plastic wrap. Patient not taking: Reported on 10/24/2017 06/24/17   Doreatha Massed, MD  losartan (COZAAR) 25 MG tablet Take 25 mg by mouth daily.    [provider]  metoprolol succinate (TOPROL-XL) 25 MG 24 hr tablet Take 25 mg by mouth 2 (two) times daily.    [provider]  niacin (NIASPAN) 500 MG CR tablet Take 500 mg by mouth every evening.  01/29/17   [provider]  Nivolumab (OPDIVO IV) Inject into the vein.    [provider]  omeprazole (PRILOSEC) 20 MG capsule Take 20 mg by mouth every evening.  07/23/14   [provider]  prochlorperazine (COMPAZINE) 10 MG tablet Take 1 tablet (10 mg total) by mouth every 6 (six) hours as needed for nausea or vomiting. 06/24/17   Doreatha Massed, MD    simvastatin (ZOCOR) 20 MG tablet Take 20 mg by mouth every evening.  12/29/15   [provider]  sucralfate (CARAFATE) 1 g tablet Crush and mix in 1 oz of water q AC and HS. Patient not taking: Reported on 10/24/2017 07/05/17   Ronny Bacon, PA-C  traMADol (ULTRAM) 50 MG tablet TAKE (1) TABLET BY MOUTH EVERY 4 HOURS AS NEEDED. 10/26/17   Lockamy, Randi L, NP-C  zolpidem (AMBIEN) 5 MG tablet Take 1 tablet (5 mg total) by mouth at bedtime as needed for sleep. 10/14/17 11/13/17  Doree Barthel, NP-C   Physical Exam: Vitals:   11/01/17 1210  Weight: 81.1 kg  Height: 5\' 9"  (1.753 m)  Temp 97.4 RR: 16 BP: 127/66 SpO2: 97   General exam: chronically ill appearing, jaundiced male, sedated,  lying comfortably supine on the gurney in no obvious distress.  Head, eyes and ENT: Nontraumatic and normocephalic. Scleral icterus.  Pupils equally reacting to light and accommodation. Oral mucosa very dry.  Neck: Supple. No JVD, carotid bruit or thyromegaly.  Lymphatics: No lymphadenopathy.  Respiratory system: Clear to auscultation. No increased work of breathing.  Cardiovascular system: S1 and S2 heard.  No JVD, murmurs, gallops, clicks or pedal edema.  Gastrointestinal system: Abdomen is mildly distended, soft. Pt has colostomy and urostomy in place. Normal bowel sounds heard.   Central nervous system: Pt is sedated but arousable.  In/Out of sleepiness. No focal neurological deficits.  Extremities: Symmetric 5 x 5 power. Peripheral pulses symmetrically felt.   Skin: Diffuse Jaundice. No rashes or acute findings.  Musculoskeletal system: Negative exam.  Psychiatry: Pleasant and cooperative.  Labs on Admission:  Basic Metabolic Panel: Recent Labs  Lab 11/01/17 0836  NA 126*  K 4.3  CL 94*  CO2 18*  GLUCOSE 130*  BUN 42*  CREATININE 1.34*  CALCIUM 8.6*   Liver Function Tests: Recent Labs  Lab 11/01/17 0836  AST 216*  ALT 167*  ALKPHOS 929*  BILITOT 10.6*   PROT 6.3*  ALBUMIN 2.7*   No results for input(s): LIPASE, AMYLASE in the last 168 hours. No results for input(s): AMMONIA in the last 168 hours. CBC: Recent Labs  Lab 11/01/17 0836  WBC 20.3*  NEUTROABS 17.4*  HGB 12.9*  HCT 37.8*  MCV 93.6  PLT 171   Cardiac Enzymes: No results for input(s): CKTOTAL, CKMB, CKMBINDEX, TROPONINI in the last 168 hours.  BNP (last 3 results) No results for input(s): PROBNP in the last 8760 hours. CBG: No results for input(s): GLUCAP in the last 168 hours.  Radiological Exams on Admission: No results found.  EKG: Independently reviewed.   Assessment/Plan Principal Problem:   Acute hepatitis Active Problems:   Hypertension   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)   Esophageal dysphagia   Jaundice   AKI (acute kidney injury) (HCC)   Leukocytosis   1. Acute hepatitis - Admit to med surg and start supportive care with IV fluids, rest, check acute hepatitis panel, check PT/INR to assess liver function. Follow CMP daily.  GI consult requested. MRCP has been ordered and results pending.  Discussed with oncology about starting steroids will wait for MRCP findings.   2. Primary squamous cell carcinoma of esophagus - Pt's most recent CT scan shows extensive worsening of liver lesions.  Pt has recently started immunotherapy but has become quite ill after the first treatment.  Oncology team consulted and will follow along in the hospital.  3. Jaundice - Pt not actively having symptoms of pruritus.  Continue supportive care and follow bilirubin levels.  Further management pending GI consult and recommendations.   4. Leukocytosis - check portable CXR.  Monitor temperature.  Follow CBC/diff.  Discussed with Dr. Melton Alar, if he remains afebrile would not start antibiotics at this time, continue to monitor.  5. AKI - likely prerenal from poor oral intake, continue IV fluids hydration and follow.  6. Essential hypertension - Holding  losartan due to acute renal injury, resume metoprolol as tolerated. Follow.  7. DNR - I spoke with patient's family at bedside and they were clear to me that patient wanted to be DNR.      DVT Prophylaxis: SCDs for now until PT/INR testing completed Code Status: DNR  Family Communication: discussed at bedside  Disposition Plan: Inpatient   Time spent: 59 mins  Standley Dakins, MD [facility]  Pager [phone]  If 7PM-7AM, please contact night-coverage [email or web address] Password [email or web address] 11/01/2017, 1:08 PM

## 2017-11-01 NOTE — Progress Notes (Signed)
Diagnosis No diagnosis found.  Staging Cancer Staging Rectal cancer [facility]) Staging form: Colon and Rectum, AJCC 8th Edition - Pathologic stage from 05/31/2016: Stage IIA (pT3, pN0, cM0) - Signed by Baird Cancer, PA-C on 05/31/2016   Assessment and Plan:   Malignant melanoma ([place]) 1.  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 - Barium swallow done at [place] showed a large mass in the distal esophagus. - PET/CT scan on 0/81/4481 hypermetabolic solid 3.5 x 2.3 cm esophageal mass in the mid to lower third of the thoracic esophagus with max SUV 7.3 and nonspecific mildly hypermetabolic nonenlarged bilateral hilar lymph nodes, indeterminate.  We discussed this with the patient in detail. - EGD on 05/26/2017 shows large fungating mass with no bleeding, 30 cm from incisors, extends to 35 cm, partially obstructing, biopsy consistent with squamous cell carcinoma. - Started on combination chemoradiation therapy with weekly carboplatin and paclitaxel on 06/27/2017.  He has developed worsening of difficulty swallowing.  He had a PEG tube placed on 07/05/2017 along with the Port-A-Cath on the same day.  He is taking and 1 can of Osmolite 1.5/day.  He is able to drink 3 ensures per day.  He is also eating soft foods. - He finished last cycle of chemotherapy on 08/02/2017 and radiation on 08/05/2017. - PET/CT scan on 09/16/2017 shows decrease in size and metabolic activity of the primary lesion in the distal esophagus.   2.  Metastatic malignant melanoma:Pt had PET scan done 09/16/2017 that showed MPRESSION: 1. Unfortunately, multiple new hypermetabolic hepatic metastasis throughout the liver. 2. New hypermetabolic metastatic adenopathy in the upper abdomen. 3. Persistent hypermetabolic hilar lymph nodes (reactive adenopathy versus metastatic adenopathy) 4. New hypermetabolic RIGHT lower lobe pulmonary nodule with differential  including small focus infection versus pulmonary metastasis. 5. Decrease in size and metabolic activity of the primary lesion in distal esophagus.  - Biopsy of the liver lesions was done 09/28/2017 with pathology returning as malignant melanoma.  He never had a known history of melanoma resected. - Pt was seen by Dr. Worthy Keeler who discussed the prognosis of metastatic malignant melanoma and treatment with palliative intent. - Foundation 1 testing shows MS-stable, TMB-low,NRAS Q61L, MYC amplification, PDL 1 TPS of 0% - He discussed combination immunotherapy with nivolumab and ipilimumab given every 3 weeks for 4 cycles followed by monthly nivolumab.  Response rates with a combination regimen is around 60% and 3-year survival of 70%.  He also talked about single agent nivolumab which has slightly low response rates of around 40% and 4-year survival about 50%.  Side effects of therapy were discussed.   -Brain MRI was negative for metastatic disease.  Pt was treated by Dr. Worthy Keeler with Clement Husbands and Ipi on 10/18/2017.    3.  Abnormal LFTS.  Pt  presented to clinic today complaining of Jaundice, dark urine and flank pain as well as nausea.    Lab done 11/01/2017 reviewed with pt and shows WBC 2.3, HB 12.9 and plts 171,000.  LFTS are elevated with bilirubin of 10.6, AST of 216 and ALT 167.  Pt is jaundiced on exam.  I have discussed with family pt will be admitted for GI evaluation and possible ERCP to evaluate for obstruction.  Pt should also have Hepatitis labs checked.  If no findings of obstruction on evaluation he will be recommended for steroids daily and close monitoring of LFTs as he may have evidence of immune mediated Hepatitis.  Will  continue to follow pt during hospitalization.  Have discussed with Dr. Wynetta Emery of Hospitalist group.    4.  Colorectal cancer: - Underwent segmental colon resection in November 2016, found to have a T3N0 adenocarcinoma, with circumferential margins positive.  Patient  did not receive any neoadjuvant or adjuvant chemotherapy/radiation therapy.  5  Prostate cancer: -Originally diagnosed in early 2000s, status post seed implants.  Underwent cystoprostatectomy in November 2016, pathology showed prostatic adenocarcinoma Gleason 8 (4+4).  6.  Pain:  Pt was on tramadol 2-3 times a day.  Pain management by hospitalist group while hospitalized.    7.  Nausea.  Pt will receive IV fluids and Phenergan in clinic.    Greater than 25 minutes spent with more than 50% spent in counseling and coordination of care.    Current Status:  Pt seen today for evaluation due to Jaundice, nausea and pain.       Prostate cancer ([place])   12/11/2014 Procedure    Prostatectomy at time of rectal ca resection    12/17/2014 Pathology Results    PROSTATE: PROSTATIC ADENOCARCINOMA WITHOUT TREATMENT EFFECT, GLEASON SCORE 4+4=8 THE TUMOR LOCATED AT THE LEFT ANTERIOR APEX AND THE MID MARGIN OF RESECTION ARE POSITIVE AT THE LEFT ANTERIOR APEX (0.4 CM, GLEASON SCORE 4+4=8) OTHER MARGINS ARE NEGATIVE FOR TUMOR     Rectal cancer ([place])   10/02/2014 Procedure    Colonoscopy by Dr. Carlean Purl- One-third circumferential medium mass was found in the distal rectum; ulcerated, firm anterior over prostate bed, multiple biopsies were performed using cold forceps- suspect carcinoma ? rectal vs recurrence of prostate    10/04/2014 Pathology Results    Rectum, biopsy ulcerated mass-invasive adenocarcinoma consistent with colorectal origin.  CDX 2 positive.  PSA negative.    10/07/2014 Initial Diagnosis    Rectal cancer ([place])    10/08/2014 Imaging    CT CAP- 1. Wall thickening of the rectum compatible with recently diagnosed rectal carcinoma. Tiny subcentimeter perirectal lymph nodes. 2. Multiple bilateral pulmonary nodules as above. While these may be infectious or inflammatory in etiology, metastatic disease in the setting of known rectal carcinoma is not excluded. 3. Nodular soft tissue within  the left mainstem bronchus which is nonspecific and may represent mucus however endobronchial nodule is not excluded. Recommend either attention on followup or correlation with bronchoscopy if the patient is having respiratory symptoms. 4. No evidence for focal hepatic lesion. 5. Bilateral low-attenuation renal lesions with internal densities greater than that of fluid, indeterminate. Recommend attention on followup or definitive characterization with pre and post contrast-enhanced MRI.    10/10/2014 Procedure    EUS by Dr. Ardis Hughs- Non-circumferential, 3.4cm [phone] (Stage IIIB) rectal adenocarcinoma along the anterior wall of the distal rectum with distal edge 1-2cm from the anal verge.    12/11/2014 Procedure    Segmental resection for tumor by Dr. Leighton Ruff    58/85/0277 Pathology Results    Colon, segmental resection for tumor, Bladder and Prostate COLON: INVASIVE COLONIC ADENOCARCINOMA (3.5 CM IN GREATEST DIMENSION) THE CARCINOMA INVASIVE THROUGH THE MUSCULARIS PROPRIA AND PRESENTS AT THE ANTERIOR MESORECTUM RESECTION MARGIN (1.5 CM, R2) THE PROXIMAL AND DISTAL RESECTION MARGINS ARE NEGATIVE FOR TUMOR LYMPHOVASCULAR INVASION IS IDENTIFED ELEVEN BENIGN LYMPH NODES (0/11) PROSTATE: PROSTATIC ADENOCARCINOMA WITHOUT TREATMENT EFFECT, GLEASON SCORE 4+4=8 THE TUMOR LOCATED AT THE LEFT ANTERIOR APEX AND THE MID MARGIN OF RESECTION ARE POSITIVE AT THE LEFT ANTERIOR APEX (0.4 CM, GLEASON SCORE 4+4=8) OTHER MARGINS ARE NEGATIVE FOR TUMOR BLADDER: BENIGN UROTHELIUM AND SUBCUTANOUS TISSUE  NEGATIVE FOR MALIGNANCY    06/20/2015 Imaging    CT CAP- 1. Interval cystoprostatectomy, urinary diversion, abdominal perineal resection and sigmoid colostomy. 2. No evidence of abdominal pelvic metastatic disease. 3. Stable small pulmonary nodules bilaterally. Based on stability over nearly 9 months, these are likely benign. Attention on continued follow-up recommended. 4. Stable low-density  renal lesions bilaterally. 5. Moderate atherosclerosis.    12/31/2015 Procedure    Colonoscopy by Dr. Heron Sabins and colostomy in the sigmoid colon.  Diverticulosis in the left colon, examination otherwise normal.    06/21/2016 Imaging    CT CAP- Stable exam. No definite evidence of recurrent or metastatic carcinoma.  Stable sub-cm noncalcified bilateral pulmonary nodules, most consistent with benign postinflammatory etiology. Recommend continued attention on follow-up imaging.  Cholelithiasis. No radiographic evidence of cholecystitis.  Aortic and coronary artery atherosclerosis.     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 ([place])    06/26/2017 - 08/08/2017 Chemotherapy    The patient had palonosetron (ALOXI) injection 0.25 mg, 0.25 mg, Intravenous,  Once, 1 of 1 cycle Administration: 0.25 mg (06/27/2017), 0.25 mg (07/05/2017), 0.25 mg (07/12/2017), 0.25 mg (07/19/2017), 0.25 mg (07/26/2017), 0.25 mg (08/02/2017) CARBOplatin (PARAPLATIN) 150 mg in sodium chloride 0.9 % 100 mL chemo infusion, 150 mg (100 % of original dose 150 mg), Intravenous,  Once, 1 of 1 cycle Dose modification: 150 mg (original dose 150 mg, Cycle 1, Reason: Provider Judgment), 125 mg (original dose 125 mg, Cycle 1, Reason: Provider Judgment),   (original dose 150 mg, Cycle 1), 150 mg (original dose 150 mg, Cycle 1, Reason: Provider Judgment), 150 mg (original dose 150 mg, Cycle 1, Reason: Provider Judgment), 150 mg (original dose 150 mg, Cycle 1, Reason: Provider Judgment), 150 mg (original dose 150 mg, Cycle 1, Reason: Provider Judgment) Administration: 150 mg (06/27/2017), 130 mg (07/05/2017), 150 mg (07/12/2017), 150 mg (07/19/2017), 150 mg (07/26/2017), 150 mg (08/02/2017) PACLitaxel (TAXOL) 78 mg in sodium chloride 0.9 % 250 mL chemo infusion (</= 71m/m2), 40 mg/m2 = 78 mg (80 % of original dose 50 mg/m2),  Intravenous,  Once, 1 of 1 cycle Dose modification: 40 mg/m2 (80 % of original dose 50 mg/m2, Cycle 1, Reason: Provider Judgment), 40 mg/m2 (80 % of original dose 50 mg/m2, Cycle 1, Reason: Provider Judgment) Administration: 78 mg (06/27/2017), 78 mg (07/05/2017), 78 mg (07/12/2017), 78 mg (07/19/2017), 78 mg (07/26/2017), 78 mg (08/02/2017)  for chemotherapy treatment.      [place]alignant [place]   10/24/2017 Initial Diagnosis    [place]alignant [place] ([place]    10/24/2017 -  Chemotherapy    The patient had ipilimumab (YERVOY) 250 mg in sodium chloride 0.9 % 100 mL chemo infusion, 3.1 mg/kg = 240 mg, Intravenous,  Once, 1 of 4 cycles Administration: 250 mg (10/26/2017) nivolumab (OPDIVO) 81 mg in sodium chloride 0.9 % 50 mL chemo infusion, 1 mg/kg = 81 mg, Intravenous, Once, 1 of 10 cycles Administration: 81 mg (10/26/2017)  for chemotherapy treatment.       Problem List Patient Active Problem List   Diagnosis Date Noted  . Malignant melanoma ([place] [C43.9] 10/24/2017  . Goals of care, counseling/discussion [Z71.89] 10/24/2017  . Esophageal dysphagia [R13.10]   . 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 SBO (small bowel obstruction) ([place] [K56.609] 03/05/2017  . Incisional hernia of anterior abdominal wall without obstruction or gangrene [[N232]  03/02/2017  . Pulmonary nodules/lesions, multiple [R91.8] 10/17/2014  . Bilateral renal masses [N28.89] 10/17/2014  . Rectal cancer ([place]) [C20] 10/07/2014  . Hypertension [I10] 11/17/2011  . High cholesterol [E78.00] 11/17/2011  . Prostate cancer ([place]) [C61] 11/17/2011  . Colon polyps [K63.5] 11/17/2011    Past Medical History Past Medical History:  Diagnosis Date  . Arthritis   . Colostomy in place [facility])   . Esophageal cancer ([place])   . GERD (gastroesophageal reflux disease)   . History of colon polyps   . History of hiatal hernia   . History of prostate cancer 2001   s/p  radiactive seed  prostate implants/  11/ 2016  prostatectomy at time of rectal cancer resection (Gleason 4+4)  . HTN (hypertension)    followed by pcp  . Hyperlipidemia   . Presence of urostomy ([place])   . Rectal cancer ([place]) 10/07/2014   no chemo or radiation/ bladder was removed also  . Tinnitus    per pt intermittant   . Wears glasses     Past Surgical History Past Surgical History:  Procedure Laterality Date  . APPENDECTOMY  1955  . COLONOSCOPY    . CYSTO N/A 12/11/2014   Procedure:  CYSTO,  PROSTATECTOMY, CYSTECTOMY colon conduit, BILATERAL LYMPH NODE DISSECTION;  Surgeon: Alexis Frock, MD;  Location: [facility];  Service: Urology;  Laterality: N/A;  . ESOPHAGOGASTRODUODENOSCOPY (EGD) WITH PROPOFOL N/A 07/05/2017   Procedure: ESOPHAGOGASTRODUODENOSCOPY (EGD) WITH PROPOFOL;  Surgeon: Aviva Signs, MD;  Location: [facility];  Service: General;  Laterality: N/A;  . EUS N/A 10/10/2014   Procedure: LOWER ENDOSCOPIC ULTRASOUND (EUS);  Surgeon: Milus Banister, MD;  Location: [facility];  Service: Endoscopy;  Laterality: N/A;  . HERNIA REPAIR    . INCISIONAL HERNIA REPAIR N/A 03/02/2017   Procedure: INCISIONAL HERNIA REPAIR WITH MESH;  Surgeon: Leighton Ruff, MD;  Location: [facility];  Service: General;  Laterality: N/A;  . INSERTION OF MESH  03/02/2017   Procedure: INSERTION OF MESH;  Surgeon: Leighton Ruff, MD;  Location: [facility];  Service: General;;  . LAPAROSCOPIC INTERNAL HERNIA REPAIR N/A 03/06/2017   Procedure: REOPENING OF RECENT LAPAROTOMY WITH CLOSURE OF INCISIONAL HERNIA WITH MESH;  Surgeon: Leighton Ruff, MD;  Location: [facility];  Service: General;  Laterality: N/A;  . PEG PLACEMENT N/A 07/05/2017   Procedure: PERCUTANEOUS ENDOSCOPIC GASTROSTOMY (PEG) PLACEMENT;  Surgeon: Aviva Signs, MD;  Location: [facility];  Service: General;  Laterality: N/A;  . PORTACATH PLACEMENT Left 07/05/2017   Procedure: INSERTION PORT WITH ATTACHED CATHETER IN LEFT SUBCLAVIAN;  Surgeon: Aviva Signs, MD;  Location: [facility];   Service: General;  Laterality: Left;  . RADIOACTIVE PROSTATE SEED IMPLANTS  02-06-1999  dr Jeffie Pollock  [facility]  . XI ROBOTIC ASSISTED LOWER ANTERIOR RESECTION N/A 12/11/2014   Procedure: XI ROBOTIC ASSISTED ABDOMINAL PERINEAL RESECTION OMENTOPLASTY;  Surgeon: Leighton Ruff, MD;  Location: [facility];  Service: General;  Laterality: N/A;    Family History Family History  Problem Relation Age of Onset  . Diabetes Mother   . Heart disease Mother   . Heart disease Father   . Colon cancer Sister   . Prostate cancer Brother   . Esophageal cancer Neg Hx   . Rectal cancer Neg Hx   . Stomach cancer Neg Hx      Social History  reports that he has never smoked. He has never used smokeless tobacco. He reports that he drinks alcohol. He reports that he does not use drugs.  Medications  Current  Outpatient Medications:  .  aspirin EC 81 MG tablet, Take 162 mg by mouth at bedtime. Takes 30 minutes prior to Niacin, Disp: , Rfl:  .  cyanocobalamin 500 MCG tablet, Take 500 mcg by mouth daily. Vitamin B-12, Disp: , Rfl:  .  Ipilimumab (YERVOY IV), Inject into the vein., Disp: , Rfl:  .  lidocaine-prilocaine (EMLA) cream, Apply a quarter size amount to port site 1 hour prior to chemo. Do not rub in. Cover with plastic wrap. (Patient not taking: Reported on 10/24/2017), Disp: 30 g, Rfl: 3 .  losartan (COZAAR) 25 MG tablet, Take 25 mg by mouth daily., Disp: , Rfl:  .  metoprolol succinate (TOPROL-XL) 25 MG 24 hr tablet, Take 25 mg by mouth 2 (two) times daily., Disp: , Rfl:  .  niacin (NIASPAN) 500 MG CR tablet, Take 500 mg by mouth every evening. , Disp: , Rfl:  .  Nivolumab (OPDIVO IV), Inject into the vein., Disp: , Rfl:  .  omeprazole (PRILOSEC) 20 MG capsule, Take 20 mg by mouth every evening. , Disp: , Rfl:  .  prochlorperazine (COMPAZINE) 10 MG tablet, Take 1 tablet (10 mg total) by mouth every 6 (six) hours as needed for nausea or vomiting., Disp: 30 tablet, Rfl: 2 .  simvastatin (ZOCOR) 20 MG tablet, Take 20  mg by mouth every evening. , Disp: , Rfl:  .  sucralfate (CARAFATE) 1 g tablet, Crush and mix in 1 oz of water q AC and HS. (Patient not taking: Reported on 10/24/2017), Disp: 120 tablet, Rfl: 2 .  traMADol (ULTRAM) 50 MG tablet, TAKE (1) TABLET BY MOUTH EVERY 4 HOURS AS NEEDED., Disp: 30 tablet, Rfl: 0 .  zolpidem (AMBIEN) 5 MG tablet, Take 1 tablet (5 mg total) by mouth at bedtime as needed for sleep., Disp: 30 tablet, Rfl: 0  Current Facility-Administered Medications:  .  0.9 %  sodium chloride infusion, , Intravenous, Continuous, Derrick Orris, MD, Last Rate: 500 mL/hr at 11/01/17 0947  Allergies Morphine and related and Oxycodone  Review of Systems Review of Systems - Oncology ROS negative other than nausea, fatigue and Jaundice   Physical Exam  Vitals Wt Readings from Last 3 Encounters:  10/26/17 181 lb (82.1 kg)  10/24/17 177 lb 9.6 oz (80.6 kg)  10/14/17 176 lb 1.6 oz (79.9 kg)   Temp Readings from Last 3 Encounters:  10/26/17 97.9 F (36.6 C) (Oral)  10/24/17 97.7 F (36.5 C) (Oral)  10/14/17 97.6 F (36.4 C) (Oral)   BP Readings from Last 3 Encounters:  10/26/17 (!) 111/54  10/24/17 (!) 84/52  10/14/17 118/66   Pulse Readings from Last 3 Encounters:  10/26/17 77  10/24/17 84  10/14/17 91   Constitutional: Well-developed, well-nourished, and in no distress.   HENT: Head: Normocephalic and atraumatic.  Mouth/Throat: No oropharyngeal exudate. Mucosa moist. Eyes: Pupils are equal, round, and reactive to light. Conjunctivae are normal. Scleral icterus Neck: Normal range of motion. Neck supple. No JVD present.  Cardiovascular: Normal rate, regular rhythm and normal heart sounds.  Exam reveals no gallop and no friction rub.   No murmur heard. Pulmonary/Chest: Effort normal and breath sounds normal. No respiratory distress. No wheezes.No rales.  Abdominal: Soft. Bowel sounds are normal. No distension. There is no tenderness. There is no guarding.  Musculoskeletal:  No edema or tenderness.  Lymphadenopathy: No cervical, axillary or supraclavicular adenopathy.  Neurological: Alert and oriented to person, place, and time. No cranial nerve deficit.  Skin: Skin is warm and  dry. No rash noted. No erythema. Jaundice noted.   Psychiatric: Affect and judgment normal.   Labs Appointment on 11/01/2017  Component Date Value Ref Range Status  . WBC 11/01/2017 20.3* 4.0 - 10.5 K/uL Final  . RBC 11/01/2017 4.04* 4.22 - 5.81 MIL/uL Final  . Hemoglobin 11/01/2017 12.9* 13.0 - 17.0 g/dL Final  . HCT 11/01/2017 37.8* 39.0 - 52.0 % Final  . MCV 11/01/2017 93.6  80.0 - 100.0 fL Final  . MCH 11/01/2017 31.9  26.0 - 34.0 pg Final  . MCHC 11/01/2017 34.1  30.0 - 36.0 g/dL Final  . RDW 11/01/2017 15.7* 11.5 - 15.5 % Final  . Platelets 11/01/2017 171  150 - 400 K/uL Final  . nRBC 11/01/2017 0.0  0.0 - 0.2 % Final  . Neutrophils Relative % 11/01/2017 85  % Final  . Neutro Abs 11/01/2017 17.4* 1.7 - 7.7 K/uL Final  . Lymphocytes Relative 11/01/2017 4  % Final  . Lymphs Abs 11/01/2017 0.8  0.7 - 4.0 K/uL Final  . Monocytes Relative 11/01/2017 8  % Final  . Monocytes Absolute 11/01/2017 1.5* 0.1 - 1.0 K/uL Final  . Eosinophils Relative 11/01/2017 0  % Final  . Eosinophils Absolute 11/01/2017 0.1  0.0 - 0.5 K/uL Final  . Basophils Relative 11/01/2017 0  % Final  . Basophils Absolute 11/01/2017 0.1  0.0 - 0.1 K/uL Final  . Immature Granulocytes 11/01/2017 3  % Final  . Abs Immature Granulocytes 11/01/2017 0.54* 0.00 - 0.07 K/uL Final   Performed at [facility] [address], [place], [place] [address]  . Sodium 11/01/2017 126* 135 - 145 mmol/L Final  . Potassium 11/01/2017 4.3  3.5 - 5.1 mmol/L Final  . Chloride 11/01/2017 94* 98 - 111 mmol/L Final  . CO2 11/01/2017 18* 22 - 32 mmol/L Final  . Glucose, Bld 11/01/2017 130* 70 - 99 mg/dL Final  . BUN 11/01/2017 42* 8 - 23 mg/dL Final  . Creatinine, Ser 11/01/2017 1.34* 0.61 - 1.24 mg/dL Final  . Calcium 11/01/2017 8.6*  8.9 - 10.3 mg/dL Final  . Total Protein 11/01/2017 6.3* 6.5 - 8.1 g/dL Final  . Albumin 11/01/2017 2.7* 3.5 - 5.0 g/dL Final  . AST 11/01/2017 216* 15 - 41 U/L Final  . ALT 11/01/2017 167* 0 - 44 U/L Final  . Alkaline Phosphatase 11/01/2017 929* 38 - 126 U/L Final  . Total Bilirubin 11/01/2017 10.6* 0.3 - 1.2 mg/dL Final  . GFR calc non Af Amer 11/01/2017 47* >60 mL/min Final  . GFR calc Af Amer 11/01/2017 54* >60 mL/min Final   Comment: (NOTE) The eGFR has been calculated using the CKD EPI equation. This calculation has not been validated in all clinical situations. eGFR's persistently <60 mL/min signify possible Chronic Kidney Disease.   Georgiann Hahn gap 11/01/2017 14  5 - 15 Final   Performed at [facility], [address] [place], [place] [address]     Pathology No orders of the defined types were placed in this encounter.      Zoila Shutter MD

## 2017-11-02 ENCOUNTER — Encounter (HOSPITAL_COMMUNITY): Payer: Self-pay | Admitting: Primary Care

## 2017-11-02 ENCOUNTER — Observation Stay (HOSPITAL_COMMUNITY): Payer: PPO

## 2017-11-02 DIAGNOSIS — R627 Adult failure to thrive: Secondary | ICD-10-CM | POA: Diagnosis present

## 2017-11-02 DIAGNOSIS — Z906 Acquired absence of other parts of urinary tract: Secondary | ICD-10-CM | POA: Diagnosis not present

## 2017-11-02 DIAGNOSIS — I824Y1 Acute embolism and thrombosis of unspecified deep veins of right proximal lower extremity: Secondary | ICD-10-CM | POA: Diagnosis present

## 2017-11-02 DIAGNOSIS — Z936 Other artificial openings of urinary tract status: Secondary | ICD-10-CM | POA: Diagnosis not present

## 2017-11-02 DIAGNOSIS — Z515 Encounter for palliative care: Secondary | ICD-10-CM | POA: Diagnosis not present

## 2017-11-02 DIAGNOSIS — B179 Acute viral hepatitis, unspecified: Secondary | ICD-10-CM | POA: Diagnosis present

## 2017-11-02 DIAGNOSIS — Z9049 Acquired absence of other specified parts of digestive tract: Secondary | ICD-10-CM | POA: Diagnosis not present

## 2017-11-02 DIAGNOSIS — C439 Malignant melanoma of skin, unspecified: Secondary | ICD-10-CM | POA: Diagnosis present

## 2017-11-02 DIAGNOSIS — Z66 Do not resuscitate: Secondary | ICD-10-CM | POA: Diagnosis present

## 2017-11-02 DIAGNOSIS — C787 Secondary malignant neoplasm of liver and intrahepatic bile duct: Secondary | ICD-10-CM | POA: Diagnosis present

## 2017-11-02 DIAGNOSIS — D72829 Elevated white blood cell count, unspecified: Secondary | ICD-10-CM | POA: Diagnosis present

## 2017-11-02 DIAGNOSIS — I10 Essential (primary) hypertension: Secondary | ICD-10-CM | POA: Diagnosis present

## 2017-11-02 DIAGNOSIS — Z6826 Body mass index (BMI) 26.0-26.9, adult: Secondary | ICD-10-CM | POA: Diagnosis not present

## 2017-11-02 DIAGNOSIS — I82401 Acute embolism and thrombosis of unspecified deep veins of right lower extremity: Secondary | ICD-10-CM

## 2017-11-02 DIAGNOSIS — Z933 Colostomy status: Secondary | ICD-10-CM | POA: Diagnosis not present

## 2017-11-02 DIAGNOSIS — C155 Malignant neoplasm of lower third of esophagus: Secondary | ICD-10-CM | POA: Diagnosis present

## 2017-11-02 DIAGNOSIS — E871 Hypo-osmolality and hyponatremia: Secondary | ICD-10-CM | POA: Diagnosis not present

## 2017-11-02 DIAGNOSIS — R17 Unspecified jaundice: Secondary | ICD-10-CM | POA: Diagnosis not present

## 2017-11-02 DIAGNOSIS — R1310 Dysphagia, unspecified: Secondary | ICD-10-CM | POA: Diagnosis not present

## 2017-11-02 DIAGNOSIS — K219 Gastro-esophageal reflux disease without esophagitis: Secondary | ICD-10-CM | POA: Diagnosis present

## 2017-11-02 DIAGNOSIS — Z931 Gastrostomy status: Secondary | ICD-10-CM | POA: Diagnosis not present

## 2017-11-02 DIAGNOSIS — R945 Abnormal results of liver function studies: Secondary | ICD-10-CM | POA: Diagnosis present

## 2017-11-02 DIAGNOSIS — Z9079 Acquired absence of other genital organ(s): Secondary | ICD-10-CM | POA: Diagnosis not present

## 2017-11-02 DIAGNOSIS — Z86010 Personal history of colonic polyps: Secondary | ICD-10-CM | POA: Diagnosis not present

## 2017-11-02 DIAGNOSIS — E785 Hyperlipidemia, unspecified: Secondary | ICD-10-CM | POA: Diagnosis present

## 2017-11-02 DIAGNOSIS — Z7189 Other specified counseling: Secondary | ICD-10-CM

## 2017-11-02 DIAGNOSIS — I82411 Acute embolism and thrombosis of right femoral vein: Secondary | ICD-10-CM | POA: Diagnosis present

## 2017-11-02 DIAGNOSIS — I82431 Acute embolism and thrombosis of right popliteal vein: Secondary | ICD-10-CM | POA: Diagnosis present

## 2017-11-02 DIAGNOSIS — N179 Acute kidney failure, unspecified: Secondary | ICD-10-CM | POA: Diagnosis present

## 2017-11-02 LAB — MAGNESIUM: MAGNESIUM: 2.6 mg/dL — AB (ref 1.7–2.4)

## 2017-11-02 LAB — COMPREHENSIVE METABOLIC PANEL
ALK PHOS: 884 U/L — AB (ref 38–126)
ALT: 154 U/L — ABNORMAL HIGH (ref 0–44)
ANION GAP: 13 (ref 5–15)
AST: 189 U/L — AB (ref 15–41)
Albumin: 2.4 g/dL — ABNORMAL LOW (ref 3.5–5.0)
BUN: 49 mg/dL — AB (ref 8–23)
CO2: 19 mmol/L — ABNORMAL LOW (ref 22–32)
Calcium: 8.3 mg/dL — ABNORMAL LOW (ref 8.9–10.3)
Chloride: 95 mmol/L — ABNORMAL LOW (ref 98–111)
Creatinine, Ser: 1.22 mg/dL (ref 0.61–1.24)
GFR calc Af Amer: >60 mL/min (ref >60)
GFR, EST NON AFRICAN AMERICAN: 53 mL/min — AB (ref >60)
Glucose, Bld: 133 mg/dL — ABNORMAL HIGH (ref 70–99)
POTASSIUM: 4.4 mmol/L (ref 3.5–5.1)
Sodium: 127 mmol/L — ABNORMAL LOW (ref 135–145)
TOTAL PROTEIN: 5.9 g/dL — AB (ref 6.5–8.1)
Total Bilirubin: 11.5 mg/dL — ABNORMAL HIGH (ref 0.3–1.2)

## 2017-11-02 LAB — HEPATITIS PANEL, ACUTE
HCV Ab: <0.1 s/co ratio (ref 0.0–0.9)
HEP A IGM: NEGATIVE
Hep B C IgM: NEGATIVE
Hepatitis B Surface Ag: NEGATIVE

## 2017-11-02 LAB — HEPATIC FUNCTION PANEL
ALT: 147 U/L — ABNORMAL HIGH (ref 0–44)
AST: 174 U/L — ABNORMAL HIGH (ref 15–41)
Albumin: 2.4 g/dL — ABNORMAL LOW (ref 3.5–5.0)
Alkaline Phosphatase: 873 U/L — ABNORMAL HIGH (ref 38–126)
BILIRUBIN DIRECT: 7.3 mg/dL — AB (ref 0.0–0.2)
BILIRUBIN TOTAL: 11 mg/dL — AB (ref 0.3–1.2)
Indirect Bilirubin: 3.7 mg/dL — ABNORMAL HIGH (ref 0.3–0.9)
Total Protein: 5.7 g/dL — ABNORMAL LOW (ref 6.5–8.1)

## 2017-11-02 LAB — CBC WITH DIFFERENTIAL/PLATELET
Abs Immature Granulocytes: 0.45 10*3/uL — ABNORMAL HIGH (ref 0.00–0.07)
BASOS PCT: 0 %
Basophils Absolute: 0.1 10*3/uL (ref 0.0–0.1)
Eosinophils Absolute: 0 10*3/uL (ref 0.0–0.5)
Eosinophils Relative: 0 %
HCT: 35.8 % — ABNORMAL LOW (ref 39.0–52.0)
Hemoglobin: 12.3 g/dL — ABNORMAL LOW (ref 13.0–17.0)
IMMATURE GRANULOCYTES: 3 %
Lymphocytes Relative: 2 %
Lymphs Abs: 0.4 10*3/uL — ABNORMAL LOW (ref 0.7–4.0)
MCH: 32.2 pg (ref 26.0–34.0)
MCHC: 34.4 g/dL (ref 30.0–36.0)
MCV: 93.7 fL (ref 80.0–100.0)
MONOS PCT: 3 %
Monocytes Absolute: 0.4 10*3/uL (ref 0.1–1.0)
NEUTROS PCT: 92 %
Neutro Abs: 16.3 10*3/uL — ABNORMAL HIGH (ref 1.7–7.7)
PLATELETS: 152 10*3/uL (ref 150–400)
RBC: 3.82 MIL/uL — ABNORMAL LOW (ref 4.22–5.81)
RDW: 16.2 % — ABNORMAL HIGH (ref 11.5–15.5)
WBC: 17.6 10*3/uL — ABNORMAL HIGH (ref 4.0–10.5)
nRBC: 0 % (ref 0.0–0.2)

## 2017-11-02 MED ORDER — APIXABAN 5 MG PO TABS: 5.0000 mg | ORAL_TABLET | Freq: Two times a day (BID) | ORAL | Status: DC

## 2017-11-02 MED ORDER — APIXABAN 5 MG PO TABS
10.0000 mg | ORAL_TABLET | Freq: Two times a day (BID) | ORAL | Status: DC
  Administered 2017-11-02 – 2017-11-04 (×5): 10 mg via ORAL
  Filled 2017-11-02 (×5): qty 2

## 2017-11-02 NOTE — Progress Notes (Signed)
Subjective:  Patient without significant complaints. No abdominal pain. Eating very little. Daughters at bedside.   Objective: Vital signs in last 24 hours: Temp:  [97.6 F (36.4 C)-98 F (36.7 C)] 98 F (36.7 C) (10/16 0530) Pulse Rate:  [83-88] 88 (10/16 0530) Resp:  [18-20] 20 (10/16 0530) BP: (112-137)/(63-70) 112/63 (10/16 0530) SpO2:  [93 %-96 %] 93 % (10/16 0530)   General:   Alert,  Thin male, pleasant and cooperative in NAD Head:  Normocephalic and atraumatic. Eyes:  Sclera clear, no icterus. . Abdomen:  Soft, nontender and nondistended.PEG site intact. Ostomies intact.  Normal bowel sounds, without guarding, and without rebound.   Extremities:  RLE with 2+pitting edema. LLE without edema.  Neurologic:  Alert and  oriented x4;  grossly normal neurologically. Skin:  Intact without significant lesions or rashes. Psych:  Alert and cooperative. Normal mood and affect.  Intake/Output from previous day: 10/15 0701 - 10/16 0700 In: 1022.7 [P.O.:120; I.V.:902.7] Out: 600 [Urine:600] Intake/Output this shift: Total I/O In: 120 [P.O.:120] Out: -   Lab Results: CBC Recent Labs    11/01/17 0836 11/02/17 0439  WBC 20.3* 17.6*  HGB 12.9* 12.3*  HCT 37.8* 35.8*  MCV 93.6 93.7  PLT 171 152   BMET Recent Labs    11/01/17 0836 11/02/17 0439  NA 126* 127*  K 4.3 4.4  CL 94* 95*  CO2 18* 19*  GLUCOSE 130* 133*  BUN 42* 49*  CREATININE 1.34* 1.22  CALCIUM 8.6* 8.3*   LFTs Recent Labs    11/01/17 0836 11/02/17 0439  BILITOT 10.6* 11.5*  ALKPHOS 929* 884*  AST 216* 189*  ALT 167* 154*  PROT 6.3* 5.9*  ALBUMIN 2.7* 2.4*   No results for input(s): LIPASE in the last 72 hours. PT/INR Recent Labs    11/01/17 1459  LABPROT 14.4  INR 1.13      Imaging Studies: Mr Jeri Cos NA Contrast  Result Date: 10/20/2017 CLINICAL DATA:  New diagnosis of metastatic melanoma. History of prostate cancer, esophageal cancer, hypertension and hyperlipidemia. EXAM: MRI HEAD  WITHOUT AND WITH CONTRAST TECHNIQUE: Multiplanar, multiecho pulse sequences of the brain and surrounding structures were obtained without and with intravenous contrast. CONTRAST:  7.5 cc Gadavist COMPARISON:  MRI head November 02, 2005 FINDINGS: INTRACRANIAL CONTENTS: No reduced diffusion to suggest acute ischemia or hypercellular tumor. No susceptibility artifact to suggest hemorrhage. The ventricles and sulci are normal for patient's age. Similar minimal supratentorial white matter FLAIR T2 hyperintensities compatible with chronic small vessel ischemic changes, less than expected for age. No suspicious parenchymal signal, masses, mass effect. No abnormal intraparenchymal or extra-axial enhancement. No abnormal extra-axial fluid collections. No extra-axial masses. VASCULAR: Normal major intracranial vascular flow voids present at skull base. SKULL AND UPPER CERVICAL SPINE: No abnormal sellar expansion. No suspicious calvarial bone marrow signal. Mild cystic degenerative changes C1-2. Craniocervical junction maintained. SINUSES/ORBITS: The mastoid air-cells and included paranasal sinuses are well-aerated.The included ocular globes and orbital contents are non-suspicious. OTHER: None. IMPRESSION: No metastatic disease; negative MRI head with and without contrast for age. Electronically Signed   By: Elon Alas M.D.   On: 10/20/2017 13:49   Ct Abdomen Pelvis W Contrast  Result Date: 10/24/2017 CLINICAL DATA:  NEW DX MELANOMA, HX PROSTATE CA 2001 WITH SEED, RECTAL CA 2016 WITH SURG ONLY PROSTECTOMY DONE AT THAT TIME, HX HIATAL HERNIA, ESOPHAGEAL CA. PT C/O RLQ ABDOMEN PAIN TODAY. EXAM: CT ABDOMEN AND PELVIS WITH CONTRAST TECHNIQUE: Multidetector CT imaging of the abdomen and pelvis  was performed using the standard protocol following bolus administration of intravenous contrast. CONTRAST:  122mL ISOVUE-300 IOPAMIDOL (ISOVUE-300) INJECTION 61% COMPARISON:  PET-CT, 09/16/2017. Chest, abdomen and pelvis CT,  06/21/2016 and 06/20/2015. FINDINGS: Lower chest: Small calcified granuloma at the right lung base. 3 mm noncalcified nodule left lower lobe, image 14, series 5, stable from the CT dated 06/20/2015. No acute findings. Hepatobiliary: Liver mildly enlarged demonstrating numerous metastatic lesions, which have progressed since the prior PET-CT. A mass in segment 4A measures 3 cm, 1.6 cm on the prior PET-CT. Mass at the dome of segment 7 measures 3.2 cm, previously 1.1 cm. There are multiple new and enlarged metastatic lesions. Small dependent gallstones, stable. No evidence of acute cholecystitis. No bile duct dilation. Pancreas: Unremarkable. No pancreatic ductal dilatation or surrounding inflammatory changes. Spleen: Normal in size without focal abnormality. Adrenals/Urinary Tract: No adrenal masses. Kidneys normal in size, orientation and position with symmetric enhancement and excretion. Stable bilateral low-density renal masses are noted consistent with cysts. No stones. No hydronephrosis. Ureters extend inferiorly into a ileal conduit that lies in the anterior pelvis. There are stones within the ileo conduit, stable from the prior exam. No hydronephrosis. Stomach/Bowel: Well-positioned gastrostomy tube along the anterior stomach. No stomach wall thickening or inflammation. Small bowel is normal in caliber. No wall thickening. No inflammation. In the left mid to lower abdomen, a loop of small bowel enters the colostomy defect. There is no evidence of strangulation or incarceration. Colon is normal in caliber with no wall thickening or inflammation. Left colon extends into a left lower quadrant colostomy. Vascular/Lymphatic: There are enlarged upper abdominal lymph nodes. Largest gastrohepatic lymph node measures 2.7 cm in short axis, previously 1.7 cm. There are enlarged lymph node lies posterior to the pancreatic body and proximal tail measuring 2 cm in short axis, previously 1 cm. A peri celiac node measures 3.4  cm, previously 1.3 cm. A preaortic node just below the left renal vein measures 2.1 cm, previously 8 mm. Aortic atherosclerosis.  No venous thrombosis.  No aneurysm. Reproductive: S/p prostatectomy. Other: Trace amount of ascites adjacent to the liver and pelvis. Musculoskeletal: No fracture. No osteoblastic or osteolytic lesions. IMPRESSION: 1. There has been significant interval progression of metastatic disease, with numerous new and enlarged liver masses, which now enlarged the liver. This progression has occurred since the PET-CT dated 09/16/2017. There has also been progression upper abdominal adenopathy along the peripancreatic, peri celiac and gastrohepatic ligament change as well as the upper retroperitoneum. Trace amount of ascites is new from the prior study. 2. No acute findings. No evidence of bowel obstruction or inflammation. 3. Small bowel herniates into the left lower quadrant colostomy site without strangulation, incarceration or obstruction. This is more prominent than it was on the prior PET-CT. Other postsurgical changes are stable. Electronically Signed   By: Lajean Manes M.D.   On: 10/24/2017 12:57   Mr 3d Recon At Scanner  Result Date: 11/01/2017 CLINICAL DATA:  Jaundice with left flank pain, generalized malaise and increasing weakness. History of squamous cell esophageal cancer, rectal cancer and remote prostate cancer. Undergoing immunotherapy. EXAM: MRI ABDOMEN WITHOUT AND WITH CONTRAST (INCLUDING MRCP) TECHNIQUE: Multiplanar multisequence MR imaging of the abdomen was performed both before and after the administration of intravenous contrast. Heavily T2-weighted images of the biliary and pancreatic ducts were obtained, and three-dimensional MRCP images were rendered by post processing. CONTRAST:  7.5 cc Gadavist COMPARISON:  Abdominopelvic CT 03/05/2017.  PET-CT 09/16/2017. FINDINGS: Despite efforts by the technologist and  patient, mild motion artifact is present on today's exam and  could not be eliminated. This reduces exam sensitivity and specificity. Lower chest: There are small bilateral pleural effusions with mild dependent atelectasis at both lung bases. Hepatobiliary: Interval marked enlargement of the liver which measures approximately 25.2 x 16.5 x 29.4 cm. There are innumerable hepatic lesions which have markedly progressed compared with the PET-CT of 6 weeks ago. Most of the lesions demonstrate low T1 signal, although there are a few lesions with intrinsic T1 shortening. There is heterogeneous T2 signal with areas of hyperintensity. Following contrast, some of the lesions demonstrate heterogeneous peripheral enhancement. Many of the lesions appear necrotic without central enhancement. The vast majority of the hepatic parenchyma is replaced by tumor. The gallbladder appears contracted and is not well visualized. There is no extrahepatic biliary dilatation. Pancreas: Unremarkable. No pancreatic ductal dilatation or surrounding inflammatory changes. Spleen: Normal in size without focal abnormality. Adrenals/Urinary Tract: Both adrenal glands appear normal. There is a simple cyst measuring 2.8 cm in the upper pole the left kidney. No evidence of enhancing renal mass or hydronephrosis. Stomach/Bowel: No evidence of bowel wall thickening, distention or surrounding inflammatory change. Vascular/Lymphatic: There are multiple enlarged lymph nodes within the porta hepatis and upper retroperitoneum, most obvious on the diffusion-weighted images. These were hypermetabolic on previous PET-CT. No acute vascular findings identified. Other: Small amount of perihepatic ascites. Musculoskeletal: Although the marrow appears heterogeneous on diffusion weighted imaging, no focal lesions or abnormal enhancement identified within the spine on T2 weighted or post-contrast imaging. A simple lipoma is noted posterior to the left scapular tip. IMPRESSION: 1. Marked progression of diffuse hepatic metastatic  disease compared with PET-CT of 09/16/2017. The liver is markedly enlarged and nearly completely replaced by tumor. 2. Associated metastatic lymphadenopathy in the porta hepatis and upper retroperitoneum. 3. No evidence of biliary dilatation. The gallbladder is contracted and not well visualized. Jaundice is likely secondary to intrahepatic cholestasis. 4. Small bilateral pleural effusions and mild ascites. Electronically Signed   By: Richardean Sale M.D.   On: 11/01/2017 14:42   US Venous Img Lower Unilateral Right  Result Date: 11/02/2017 CLINICAL DATA:  Right lower extremity edema. EXAM: RIGHT LOWER EXTREMITY VENOUS DOPPLER ULTRASOUND TECHNIQUE: Gray-scale sonography with graded compression, as well as color Doppler and duplex ultrasound were performed to evaluate the lower extremity deep venous systems from the level of the common femoral vein and including the common femoral, femoral, profunda femoral, popliteal and calf veins including the posterior tibial, peroneal and gastrocnemius veins when visible. The superficial great saphenous vein was also interrogated. Spectral Doppler was utilized to evaluate flow at rest and with distal augmentation maneuvers in the common femoral, femoral and popliteal veins. COMPARISON:  None. FINDINGS: Contralateral Common Femoral Vein: Respiratory phasicity is normal and symmetric with the symptomatic side. No evidence of thrombus. Normal compressibility. Common Femoral Vein: Occlusive thrombus present up to the saphenofemoral junction. The saphenofemoral junction itself is open. Saphenofemoral Junction: No evidence of thrombus. Profunda Femoral Vein: No evidence of thrombus. Normal compressibility and flow on color Doppler imaging. Femoral Vein: Occlusive thrombus present throughout the femoral vein in the thigh. Popliteal Vein: Occlusive thrombus present. Calf Veins: Thrombus extends into posterior tibial and peroneal veins in the calf. Superficial Great Saphenous Vein:  No evidence of thrombus. Normal compressibility. Venous Reflux:  None. Other Findings: No evidence of superficial thrombophlebitis or abnormal fluid collection. IMPRESSION: Positive for right lower extremity deep venous thrombosis affecting the common femoral vein, femoral vein, popliteal  vein and visualized proximal calf veins. Thrombus appears acute/subacute by ultrasound. Electronically Signed   By: Aletta Edouard M.D.   On: 11/02/2017 10:04   Dg Chest Port 1 View  Result Date: 11/01/2017 CLINICAL DATA:  Acute hepatitis. EXAM: PORTABLE CHEST 1 VIEW COMPARISON:  Chest x-ray dated July 05, 2017. FINDINGS: Unchanged left chest wall port catheter with tip in the mid SVC. The heart size and mediastinal contours are within normal limits. Normal pulmonary vascularity. No focal consolidation, pleural effusion, or pneumothorax. No acute osseous abnormality. IMPRESSION: No active disease. Electronically Signed   By: Titus Dubin M.D.   On: 11/01/2017 13:40   Mr Abdomen Mrcp Moise Boring Contast  Result Date: 11/01/2017 CLINICAL DATA:  Jaundice with left flank pain, generalized malaise and increasing weakness. History of squamous cell esophageal cancer, rectal cancer and remote prostate cancer. Undergoing immunotherapy. EXAM: MRI ABDOMEN WITHOUT AND WITH CONTRAST (INCLUDING MRCP) TECHNIQUE: Multiplanar multisequence MR imaging of the abdomen was performed both before and after the administration of intravenous contrast. Heavily T2-weighted images of the biliary and pancreatic ducts were obtained, and three-dimensional MRCP images were rendered by post processing. CONTRAST:  7.5 cc Gadavist COMPARISON:  Abdominopelvic CT 03/05/2017.  PET-CT 09/16/2017. FINDINGS: Despite efforts by the technologist and patient, mild motion artifact is present on today's exam and could not be eliminated. This reduces exam sensitivity and specificity. Lower chest: There are small bilateral pleural effusions with mild dependent atelectasis  at both lung bases. Hepatobiliary: Interval marked enlargement of the liver which measures approximately 25.2 x 16.5 x 29.4 cm. There are innumerable hepatic lesions which have markedly progressed compared with the PET-CT of 6 weeks ago. Most of the lesions demonstrate low T1 signal, although there are a few lesions with intrinsic T1 shortening. There is heterogeneous T2 signal with areas of hyperintensity. Following contrast, some of the lesions demonstrate heterogeneous peripheral enhancement. Many of the lesions appear necrotic without central enhancement. The vast majority of the hepatic parenchyma is replaced by tumor. The gallbladder appears contracted and is not well visualized. There is no extrahepatic biliary dilatation. Pancreas: Unremarkable. No pancreatic ductal dilatation or surrounding inflammatory changes. Spleen: Normal in size without focal abnormality. Adrenals/Urinary Tract: Both adrenal glands appear normal. There is a simple cyst measuring 2.8 cm in the upper pole the left kidney. No evidence of enhancing renal mass or hydronephrosis. Stomach/Bowel: No evidence of bowel wall thickening, distention or surrounding inflammatory change. Vascular/Lymphatic: There are multiple enlarged lymph nodes within the porta hepatis and upper retroperitoneum, most obvious on the diffusion-weighted images. These were hypermetabolic on previous PET-CT. No acute vascular findings identified. Other: Small amount of perihepatic ascites. Musculoskeletal: Although the marrow appears heterogeneous on diffusion weighted imaging, no focal lesions or abnormal enhancement identified within the spine on T2 weighted or post-contrast imaging. A simple lipoma is noted posterior to the left scapular tip. IMPRESSION: 1. Marked progression of diffuse hepatic metastatic disease compared with PET-CT of 09/16/2017. The liver is markedly enlarged and nearly completely replaced by tumor. 2. Associated metastatic lymphadenopathy in the  porta hepatis and upper retroperitoneum. 3. No evidence of biliary dilatation. The gallbladder is contracted and not well visualized. Jaundice is likely secondary to intrahepatic cholestasis. 4. Small bilateral pleural effusions and mild ascites. Electronically Signed   By: Richardean Sale M.D.   On: 11/01/2017 14:42  [2 weeks]   Assessment: 84 year old male with multiple medical issues, history of multiple prior cancers, currently being treated for esophageal cancer, recent liver biopsy with malignant melanoma.  He has significant progressive tumor burden within the liver but no biliary dilation noted on MRI.  Elevated LFTs likely secondary to diffuse hepatic metastatic disease. LFTs progressed over the past 10 days.  Oncology has recommended steroids. Slight increase in bili with modest improvement in transaminases and AP.  Patient with acute right lower extremity DVT.    Plan: 1. Discussed with patient and family, no further recommendations regarding jaundice from GI standpoint. Management per oncology.  2. Management of DVT per attending.  3. Await oncology and palliative care input.  4. Will sign off for now. Call with any questions.   Laureen Ochs. Bernarda Caffey [facility] [phone] 10/16/20191:05 PM     LOS: 1 day

## 2017-11-02 NOTE — Progress Notes (Signed)
ANTICOAGULATION CONSULT NOTE - Initial Consult  Pharmacy Consult for apixaban Indication: DVT  Allergies  Allergen Reactions  . Morphine And Related     Caused hallucinations.  . Oxycodone     Caused hallucinations    Patient Measurements: Height: 5\' 9"  (175.3 cm) Weight: 178 lb 12.7 oz (81.1 kg) IBW/kg (Calculated) : 70.7   Vital Signs: Temp: 98 F (36.7 C) (10/16 0530) Temp Source: Oral (10/16 0530) BP: 112/63 (10/16 0530) Pulse Rate: 88 (10/16 0530)  Labs: Recent Labs    11/01/17 0836 11/01/17 1459 11/02/17 0439  HGB 12.9*  --  12.3*  HCT 37.8*  --  35.8*  PLT 171  --  152  LABPROT  --  14.4  --   INR  --  1.13  --   CREATININE 1.34*  --  1.22    Estimated Creatinine Clearance: 45.1 mL/min (by C-G formula based on SCr of 1.22 mg/dL).   Medical History: Past Medical History:  Diagnosis Date  . Acute hepatitis 11/01/2017  . AKI (acute kidney injury) ([place]) 11/01/2017  . Arthritis   . Colostomy in place [facility])   . Esophageal cancer ([place])   . GERD (gastroesophageal reflux disease)   . History of colon polyps   . History of hiatal hernia   . 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 HTN (hypertension)    followed by pcp  . Hyperlipidemia   . Jaundice 11/01/2017  . Leukocytosis 11/01/2017  . Presence of urostomy ([place])   . Rectal cancer ([place]) 10/07/2014   no chemo or radiation/ bladder was removed also  . Tinnitus    per pt intermittant   . Wears glasses     Medications:  Medications Prior to Admission  Medication Sig Dispense Refill Last Dose  . aspirin EC 81 MG tablet Take 162 mg by mouth at bedtime. Takes 30 minutes prior to Niacin   Taking  . cyanocobalamin 500 MCG tablet Take 500 mcg by mouth daily. Vitamin B-12   Not Taking  . Ipilimumab (YERVOY IV) Inject into the vein.     Marland Kitchen lidocaine-prilocaine (EMLA) cream Apply a quarter size  amount to port site 1 hour prior to chemo. Do not rub in. Cover with plastic wrap. (Patient not taking: Reported on 10/24/2017) 30 g 3 Not Taking  . losartan (COZAAR) 25 MG tablet Take 25 mg by mouth daily.   Taking  . metoprolol succinate (TOPROL-XL) 25 MG 24 hr tablet Take 25 mg by mouth 2 (two) times daily.   Taking  . niacin (NIASPAN) 500 MG CR tablet Take 500 mg by mouth every evening.    Taking  . Nivolumab (OPDIVO IV) Inject into the vein.     Marland Kitchen omeprazole (PRILOSEC) 20 MG capsule Take 20 mg by mouth every evening.    Taking  . prochlorperazine (COMPAZINE) 10 MG tablet Take 1 tablet (10 mg total) by mouth every 6 (six) hours as needed for nausea or vomiting. 30 tablet 2 Taking  . simvastatin (ZOCOR) 20 MG tablet Take 20 mg by mouth every evening.    Taking  . sucralfate (CARAFATE) 1 g tablet Crush and mix in 1 oz of water q AC and HS. (Patient not taking: Reported on 10/24/2017) 120 tablet 2 Not Taking  . traMADol (ULTRAM) 50 MG tablet TAKE (1) TABLET BY MOUTH EVERY 4 HOURS AS NEEDED. 30 tablet 0   .  zolpidem (AMBIEN) 5 MG tablet Take 1 tablet (5 mg total) by mouth at bedtime as needed for sleep. 30 tablet 0 Taking    Assessment: Pharmacy consulted to dose apixaban in patient with right lower extremity DVT.   Goal of Therapy:   Monitor platelets by anticoagulation protocol: Yes   Plan:  Apixaban 10 mg twice daily for 7 days. Apixaban 5 mg twice daily thereafter. Monitor labs and s/s of bleeding  Revonda Standard Livia Tarr 11/02/2017,11:00 AM

## 2017-11-02 NOTE — Discharge Instructions (Signed)
Information on my medicine - ELIQUIS (apixaban)  This medication education was reviewed with me or my healthcare representative as part of my discharge preparation.   Why was Eliquis prescribed for you? Eliquis was prescribed to treat blood clots that may have been found in the veins of your legs (deep vein thrombosis) or in your lungs (pulmonary embolism) and to reduce the risk of them occurring again.  What do You need to know about Eliquis ? The starting dose is 10 mg (two 5 mg tablets) taken TWICE daily for the FIRST SEVEN (7) DAYS, then on November 26, 2017 the dose is reduced to ONE 5 mg tablet taken TWICE daily.  Eliquis may be taken with or without food.   Try to take the dose about the same time in the morning and in the evening. If you have difficulty swallowing the tablet whole please discuss with your pharmacist how to take the medication safely.  Take Eliquis exactly as prescribed and DO NOT stop taking Eliquis without talking to the doctor who prescribed the medication.  Stopping may increase your risk of developing a new blood clot.  Refill your prescription before you run out.  After discharge, you should have regular check-up appointments with your healthcare provider that is prescribing your Eliquis.    What do you do if you miss a dose? If a dose of ELIQUIS is not taken at the scheduled time, take it as soon as possible on the same day and twice-daily administration should be resumed. The dose should not be doubled to make up for a missed dose.  Important Safety Information A possible side effect of Eliquis is bleeding. You should call your healthcare provider right away if you experience any of the following: ? Bleeding from an injury or your nose that does not stop. ? Unusual colored urine (red or dark brown) or unusual colored stools (red or black). ? Unusual bruising for unknown reasons. ? A serious fall or if you hit your head (even if there is no bleeding).  Some  medicines may interact with Eliquis and might increase your risk of bleeding or clotting while on Eliquis. To help avoid this, consult your healthcare provider or pharmacist prior to using any new prescription or non-prescription medications, including herbals, vitamins, non-steroidal anti-inflammatory drugs (NSAIDs) and supplements.  This website has more information on Eliquis (apixaban): http://www.eliquis.com/eliquis/home

## 2017-11-02 NOTE — Progress Notes (Signed)
Notified Dr. Wynetta Emery that patient ultrasound shows a DVT in the right lower extremity. Awaiting further orders

## 2017-11-02 NOTE — Progress Notes (Signed)
Diagnosis Acute hepatitis - Plan: DG CHEST PORT 1 VIEW, DG CHEST PORT 1 VIEW  Pain - Plan: MR 3D Recon At Scanner, MR 3D Recon At Scanner  Edema - Plan: US Venous Img Lower Unilateral Right, US Venous Img Lower Unilateral Right  Staging Cancer Staging Rectal cancer [facility]) Staging form: Colon and Rectum, AJCC 8th Edition - Pathologic stage from 05/31/2016: Stage IIA (pT3, pN0, cM0) - Signed by Baird Cancer, PA-C on 05/31/2016   Subjective:Pt reports he feels better today.  Family at bedside.    Objective: Vitals Patient Vitals for the past 24 hrs:  BP Temp Temp src Pulse Resp SpO2  11/02/17 1345 136/75 (!) 97.5 F (36.4 C) Oral 85 18 90 %  11/02/17 0530 112/63 98 F (36.7 C) Oral 88 20 93 %  11/01/17 2147 137/70 97.6 F (36.4 C) Oral 83 18 96 %    Physical Exam  Constitutional: Well-developed, well-nourished, and in no distress.   HENT: Head: Normocephalic and atraumatic.  Mouth/Throat: No oropharyngeal exudate. Mucosa moist. Eyes: Pupils are equal, round, and reactive to light. Conjunctivae are normal. Icteric sclera.   Neck: Normal range of motion. Neck supple. No JVD present.  Cardiovascular: Normal rate, regular rhythm and normal heart sounds.  Exam reveals no gallop and no friction rub.   No murmur heard. Pulmonary/Chest: Effort normal and breath sounds normal. No respiratory distress. No wheezes.No rales.  Abdominal: Soft. Bowel sounds are normal. No distension. There is no tenderness. There is no guarding.  Tubes noted Musculoskeletal: No edema or tenderness.  Lymphadenopathy: No cervical, axillary or supraclavicular adenopathy.  Neurological: Alert and oriented to person, place, and time. No cranial nerve deficit.  Skin: Jaundiced.   Psychiatric: Affect and judgment normal.    Medications   Allergies Morphine and related and Oxycodone  Review of Systems Review of Systems - Oncology ROS as per HPI otherwise 12 point ROS is negative.   Labs Admission  on 11/01/2017  Component Date Value Ref Range Status  . Hepatitis B Surface Ag 11/01/2017 Negative  Negative Final  . HCV Ab 11/01/2017 <0.1  0.0 - 0.9 s/co ratio Final   Comment: (NOTE)                                  Negative:     < 0.8                             Indeterminate: 0.8 - 0.9                                  Positive:     > 0.9 The CDC recommends that a positive HCV antibody result be followed up with a HCV Nucleic Acid Amplification test ([id number]). Performed At: [facility] [place], [place] [id number] Rush Farmer MD OI:[id number]   . Hep A IgM 11/01/2017 Negative  Negative Final  . Hep B C IgM 11/01/2017 Negative  Negative Final  . Prothrombin Time 11/01/2017 14.4  11.4 - 15.2 seconds Final  . INR 11/01/2017 1.13   Final   Performed at [facility], [address], [place], [place] [address]  . Sodium 11/02/2017 127* 135 - 145 mmol/L Final  . Potassium 11/02/2017 4.4  3.5 - 5.1 mmol/L Final  . Chloride 11/02/2017 95* 98 -  111 mmol/L Final  . CO2 11/02/2017 19* 22 - 32 mmol/L Final  . Glucose, Bld 11/02/2017 133* 70 - 99 mg/dL Final  . BUN 11/02/2017 49* 8 - 23 mg/dL Final  . Creatinine, Ser 11/02/2017 1.22  0.61 - 1.24 mg/dL Final  . Calcium 11/02/2017 8.3* 8.9 - 10.3 mg/dL Final  . Total Protein 11/02/2017 5.9* 6.5 - 8.1 g/dL Final  . Albumin 11/02/2017 2.4* 3.5 - 5.0 g/dL Final  . AST 11/02/2017 189* 15 - 41 U/L Final  . ALT 11/02/2017 154* 0 - 44 U/L Final  . Alkaline Phosphatase 11/02/2017 884* 38 - 126 U/L Final  . Total Bilirubin 11/02/2017 11.5* 0.3 - 1.2 mg/dL Final  . GFR calc non Af Amer 11/02/2017 53* >60 mL/min Final  . GFR calc Af Amer 11/02/2017 >60  >60 mL/min Final   Comment: (NOTE) The eGFR has been calculated using the CKD EPI equation. This calculation has not been validated in all clinical situations. eGFR's persistently <60 mL/min signify possible Chronic Kidney Disease.   Georgiann Hahn gap 11/02/2017 13  5 - 15 Final    Performed at [facility], [address], [place], [place] [address]  . Magnesium 11/02/2017 2.6* 1.7 - 2.4 mg/dL Final   Performed at [facility], [address] [place], [place] [address]  . WBC 11/02/2017 17.6* 4.0 - 10.5 K/uL Final  . RBC 11/02/2017 3.82* 4.22 - 5.81 MIL/uL Final  . Hemoglobin 11/02/2017 12.3* 13.0 - 17.0 g/dL Final  . HCT 11/02/2017 35.8* 39.0 - 52.0 % Final  . MCV 11/02/2017 93.7  80.0 - 100.0 fL Final  . MCH 11/02/2017 32.2  26.0 - 34.0 pg Final  . MCHC 11/02/2017 34.4  30.0 - 36.0 g/dL Final  . RDW 11/02/2017 16.2* 11.5 - 15.5 % Final  . Platelets 11/02/2017 152  150 - 400 K/uL Final  . nRBC 11/02/2017 0.0  0.0 - 0.2 % Final  . Neutrophils Relative % 11/02/2017 92  % Final  . Neutro Abs 11/02/2017 16.3* 1.7 - 7.7 K/uL Final  . Lymphocytes Relative 11/02/2017 2  % Final  . Lymphs Abs 11/02/2017 0.4* 0.7 - 4.0 K/uL Final  . Monocytes Relative 11/02/2017 3  % Final  . Monocytes Absolute 11/02/2017 0.4  0.1 - 1.0 K/uL Final  . Eosinophils Relative 11/02/2017 0  % Final  . Eosinophils Absolute 11/02/2017 0.0  0.0 - 0.5 K/uL Final  . Basophils Relative 11/02/2017 0  % Final  . Basophils Absolute 11/02/2017 0.1  0.0 - 0.1 K/uL Final  . Immature Granulocytes 11/02/2017 3  % Final  . Abs Immature Granulocytes 11/02/2017 0.45* 0.00 - 0.07 K/uL Final   Performed at [facility] [address], [place], [place] [address]  . Total Protein 11/02/2017 5.7* 6.5 - 8.1 g/dL Final  . Albumin 11/02/2017 2.4* 3.5 - 5.0 g/dL Final  . AST 11/02/2017 174* 15 - 41 U/L Final  . ALT 11/02/2017 147* 0 - 44 U/L Final  . Alkaline Phosphatase 11/02/2017 873* 38 - 126 U/L Final  . Total Bilirubin 11/02/2017 11.0* 0.3 - 1.2 mg/dL Final  . Bilirubin, Direct 11/02/2017 7.3* 0.0 - 0.2 mg/dL Final  . Indirect Bilirubin 11/02/2017 3.7* 0.3 - 0.9 mg/dL Final   Performed at [facility], [address] [place], [place] [address]  Appointment on 11/01/2017  Component Date Value Ref Range  Status  . WBC 11/01/2017 20.3* 4.0 - 10.5 K/uL Final  . RBC 11/01/2017 4.04* 4.22 - 5.81 MIL/uL Final  .  Hemoglobin 11/01/2017 12.9* 13.0 - 17.0 g/dL Final  . HCT 11/01/2017 37.8* 39.0 - 52.0 % Final  . MCV 11/01/2017 93.6  80.0 - 100.0 fL Final  . MCH 11/01/2017 31.9  26.0 - 34.0 pg Final  . MCHC 11/01/2017 34.1  30.0 - 36.0 g/dL Final  . RDW 11/01/2017 15.7* 11.5 - 15.5 % Final  . Platelets 11/01/2017 171  150 - 400 K/uL Final  . nRBC 11/01/2017 0.0  0.0 - 0.2 % Final  . Neutrophils Relative % 11/01/2017 85  % Final  . Neutro Abs 11/01/2017 17.4* 1.7 - 7.7 K/uL Final  . Lymphocytes Relative 11/01/2017 4  % Final  . Lymphs Abs 11/01/2017 0.8  0.7 - 4.0 K/uL Final  . Monocytes Relative 11/01/2017 8  % Final  . Monocytes Absolute 11/01/2017 1.5* 0.1 - 1.0 K/uL Final  . Eosinophils Relative 11/01/2017 0  % Final  . Eosinophils Absolute 11/01/2017 0.1  0.0 - 0.5 K/uL Final  . Basophils Relative 11/01/2017 0  % Final  . Basophils Absolute 11/01/2017 0.1  0.0 - 0.1 K/uL Final  . Immature Granulocytes 11/01/2017 3  % Final  . Abs Immature Granulocytes 11/01/2017 0.54* 0.00 - 0.07 K/uL Final   Performed at [facility], [address], [place], [place] [address]  . Sodium 11/01/2017 126* 135 - 145 mmol/L Final  . Potassium 11/01/2017 4.3  3.5 - 5.1 mmol/L Final  . Chloride 11/01/2017 94* 98 - 111 mmol/L Final  . CO2 11/01/2017 18* 22 - 32 mmol/L Final  . Glucose, Bld 11/01/2017 130* 70 - 99 mg/dL Final  . BUN 11/01/2017 42* 8 - 23 mg/dL Final  . Creatinine, Ser 11/01/2017 1.34* 0.61 - 1.24 mg/dL Final  . Calcium 11/01/2017 8.6* 8.9 - 10.3 mg/dL Final  . Total Protein 11/01/2017 6.3* 6.5 - 8.1 g/dL Final  . Albumin 11/01/2017 2.7* 3.5 - 5.0 g/dL Final  . AST 11/01/2017 216* 15 - 41 U/L Final  . ALT 11/01/2017 167* 0 - 44 U/L Final  . Alkaline Phosphatase 11/01/2017 929* 38 - 126 U/L Final  . Total Bilirubin 11/01/2017 10.6* 0.3 - 1.2 mg/dL Final  . GFR calc non Af Amer 11/01/2017 47* >60  mL/min Final  . GFR calc Af Amer 11/01/2017 54* >60 mL/min Final   Comment: (NOTE) The eGFR has been calculated using the CKD EPI equation. This calculation has not been validated in all clinical situations. eGFR's persistently <60 mL/min signify possible Chronic Kidney Disease.   Georgiann Hahn gap 11/01/2017 14  5 - 15 Final   Performed at [facility], [address] [place], [place] [address]     Pathology Orders Placed This Encounter  Procedures  . MR ABDOMEN MRCP W WO CONTAST    Standing Status:   Standing    Number of Occurrences:   1    Order Specific Question:   If indicated for the ordered procedure, I authorize the administration of contrast media per Radiology protocol    Answer:   Yes    Order Specific Question:   What is the patient's sedation requirement?    Answer:   No Sedation    Order Specific Question:   Does the patient have a pacemaker or implanted devices?    Answer:   No    Order Specific Question:   Radiology Contrast Protocol - do NOT remove file path    Answer:   \\[email or web address]\Radiant\[email or web address].PDF  . DG CHEST PORT 1 VIEW    Standing Status:  Standing    Number of Occurrences:   1    Order Specific Question:   Symptom/Reason for Exam    Answer:   Acute hepatitis [229772]    Order Specific Question:   Radiology Contrast Protocol - do NOT remove file path    Answer:   \\[email or web address]\Radiant\[email or web address].[email or web address]  . MR 3D Recon At Scanner    Standing Status:   Standing    Number of Occurrences:   1    Order Specific Question:   Symptom/Reason for Exam    Answer:   Pain [956387]    Order Specific Question:   What is the patient's sedation requirement?    Answer:   No Sedation    Order Specific Question:   Does the patient have a pacemaker or implanted devices?    Answer:   No    Order Specific Question:   Radiology Contrast Protocol - do NOT remove file path    Answer:   \\[email or web address]\Radiant\[email or web address].PDF  . US Venous Img  Lower Unilateral Right    NEED TO ASSESS FOR DVT RLE    Standing Status:   Standing    Number of Occurrences:   1    Order Specific Question:   Symptom/Reason for Exam    Answer:   Edema [782.3.ICD-9-CM]  . Hepatitis panel, acute    Standing Status:   Standing    Number of Occurrences:   1    Order Specific Question:   Specimen collection method    Answer:   Lab=Lab collect  . Comprehensive metabolic panel    Standing Status:   Standing    Number of Occurrences:   5    Order Specific Question:   Specimen collection method    Answer:   Lab=Lab collect  . Magnesium    Standing Status:   Standing    Number of Occurrences:   3    Order Specific Question:   Specimen collection method    Answer:   Lab=Lab collect  . CBC WITH DIFFERENTIAL    Standing Status:   Standing    Number of Occurrences:   4    Order Specific Question:   Specimen collection method    Answer:   Lab=Lab collect  . Protime-INR    Standing Status:   Standing    Number of Occurrences:   1    Order Specific Question:   Specimen collection method    Answer:   Lab=Lab collect  . Hepatic function panel    Standing Status:   Standing    Number of Occurrences:   1  . Diet full liquid Room service appropriate? Yes; Fluid consistency: Thin    Standing Status:   Standing    Number of Occurrences:   1    Order Specific Question:   Room service appropriate?    Answer:   Yes    Order Specific Question:   Fluid consistency:    Answer:   Thin  . Vital signs    Standing Status:   Standing    Number of Occurrences:   1  . Notify physician    Standing Status:   Standing    Number of Occurrences:   20    Order Specific Question:   Notify Physician    Answer:   for pulse less than 55 or greater than 120    Order Specific Question:   Notify Physician    Answer:   for respiratory rate less than 12 or greater than  25    Order Specific Question:   Notify Physician    Answer:   for temperature greater than 100.5 F    Order  Specific Question:   Notify Physician    Answer:   for urinary output less than 30 mL/kg/hr for four hours    Order Specific Question:   Notify Physician    Answer:   for systolic BP less than 90 or greater than 161, diastolic BP less than 60 or greater than 100  . Initiate Oral Care Protocol    Standing Status:   Standing    Number of Occurrences:   1  . Initiate Carrier Fluid Protocol    Standing Status:   Standing    Number of Occurrences:   1  . RN may order General Admission PRN Orders utilizing "General Admission PRN medications" (through manage orders) for the following patient needs: allergy symptoms (Claritin), cold sores (Carmex), cough (Robitussin DM), eye irritation (Liquifilm Tears), hemorrhoids (Tucks), indigestion (Maalox), minor skin irritation (Hydrocortisone Cream), muscle pain Suezanne Jacquet Gay), nose irritation (saline nasal spray) and sore throat (Chloraseptic spray).    Standing Status:   Standing    Number of Occurrences:   [id number]  . SCDs    Standing Status:   Standing    Number of Occurrences:   1    Order Specific Question:   Laterality    Answer:   Bilateral  . Orthostatic vital signs on admission    Standing Status:   Standing    Number of Occurrences:   1  . Advance diet as tolerated    Standing Status:   Standing    Number of Occurrences:   1    Order Specific Question:   Advance from    Answer:   Diet full liquid    Order Specific Question:   Advance to    Answer:   Diet soft    Order Specific Question:   Fluid consistency:    Answer:   Thin  . Care order/instruction: Please keep NPO until MRCP has been completed.    Please keep NPO until MRCP has been completed.    Standing Status:   Standing    Number of Occurrences:   1  . Do not attempt resuscitation (DNR)    Standing Status:   Standing    Number of Occurrences:   1    Order Specific Question:   In the event of cardiac or respiratory ARREST    Answer:   Do not call a "code blue"    Order Specific  Question:   In the event of cardiac or respiratory ARREST    Answer:   Do not perform Intubation, CPR, defibrillation or ACLS    Order Specific Question:   In the event of cardiac or respiratory ARREST    Answer:   Use medication by any route, position, wound care, and other measures to relive pain and suffering. May use oxygen, suction and manual treatment of airway obstruction as needed for comfort.  . Consult to gastroenterology Consult Timeframe: ROUTINE - requires response within 24 hours; Reason for Consult? acute hepatitis    Standing Status:   Standing    Number of Occurrences:   1    Order Specific Question:   Consult Timeframe    Answer:   ROUTINE - requires response within 24 hours    Order Specific Question:   Reason for Consult?    Answer:   acute hepatitis  . Inpatient consult  to Social Work    Possible need for [employer] Status:   Standing    Number of Occurrences:   1    Order Specific Question:   Reason for Consult:    Answer:   Other (see comments)  . Consult to care management    Standing Status:   Standing    Number of Occurrences:   1    Order Specific Question:   Reason for consult:    Answer:   Home health needs  . Consult to palliative care    Standing Status:   Standing    Number of Occurrences:   1    Order Specific Question:   Palliative Care Consult Services    Answer:   Palliative Medicine Consult    Order Specific Question:   Reason for Consult?    Answer:   METASTATIC MELANOMA, DECOMPENSATED LIVER DISEASE  . Consult to palliative care    Standing Status:   Standing    Number of Occurrences:   1    Order Specific Question:   Palliative Care Consult Services    Answer:   Palliative Medicine Consult    Order Specific Question:   Reason for Consult?    Answer:   progressive metastatic cancer, prognosis very guarded  . APIXABAN (ELIQUIS) PER PHARMACY CONSULT    Standing Status:   Standing    Number of Occurrences:   1    Order Specific  Question:   Indication:    Answer:   Other (specify in comment field on line 2)    Order Specific Question:   Comment:    Answer:   VTE  . Pulse oximetry check with vital signs    Standing Status:   Standing    Number of Occurrences:   1  . Oxygen therapy Mode or (Route): Nasal cannula; Liters Per Minute: 2; Keep 02 saturation: greater than 92 %    Standing Status:   Standing    Number of Occurrences:   1    Order Specific Question:   Mode or (Route)    Answer:   Nasal cannula    Order Specific Question:   Liters Per Minute    Answer:   2    Order Specific Question:   Keep 02 saturation    Answer:   greater than 92 %  . Place in observation (patient's expected length of stay will be less than 2 midnights)    Standing Status:   Standing    Number of Occurrences:   1    Order Specific Question:   Hospital Area    Answer:   [facility] [[id number]]    Order Specific Question:   Level of Care    Answer:   Med-Surg [16]    Order Specific Question:   Diagnosis    Answer:   Acute hepatitis [229772]    Order Specific Question:   Admitting Physician    Answer:   Murlean Iba [[id number]]    Order Specific Question:   Attending Physician    Answer:   Murlean Iba [[id number]]    Order Specific Question:   PT Class (Do Not Modify)    Answer:   Observation [104]    Order Specific Question:   PT Acc Code (Do Not Modify)    Answer:   Observation [10022]  . Admit to Inpatient (patient's expected length of stay will be greater than 2 midnights or inpatient only procedure)  Standing Status:   Standing    Number of Occurrences:   1    Order Specific Question:   Hospital Area    Answer:   [facility] [[id number]]    Order Specific Question:   Level of Care    Answer:   Med-Surg [16]    Order Specific Question:   Diagnosis    Answer:   Acute hepatitis [229772]    Order Specific Question:   Admitting Physician    Answer:   Murlean Iba [[id number]]    Order Specific Question:    Attending Physician    Answer:   Murlean Iba [[id number]]    Order Specific Question:   Estimated length of stay    Answer:   past midnight tomorrow    Order Specific Question:   Certification:    Answer:   I certify this patient will need inpatient services for at least 2 midnights    Order Specific Question:   PT Class (Do Not Modify)    Answer:   Inpatient [101]    Order Specific Question:   PT Acc Code (Do Not Modify)    Answer:   Private [1]      Assessment and Plan:  Malignant melanoma ([place]) 1.  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 - Barium swallow done at [facility] showed a large mass in the distal esophagus. - PET/CT scan on 05/11/5359 hypermetabolic solid 3.5 x 2.3 cm esophageal mass in the mid to lower third of the thoracic esophagus with max SUV 7.3 and nonspecific mildly hypermetabolic nonenlarged bilateral hilar lymph nodes, indeterminate.  We discussed this with the patient in detail. - EGD on 05/26/2017 shows large fungating mass with no bleeding, 30 cm from incisors, extends to 35 cm, partially obstructing, biopsy consistent with squamous cell carcinoma. - Started on combination chemoradiation therapy with weekly carboplatin and paclitaxel on 06/27/2017.  He has developed worsening of difficulty swallowing.  He had a PEG tube placed on 07/05/2017 along with the Port-A-Cath on the same day.  He is taking and 1 can of Osmolite 1.5/day.  He is able to drink 3 ensures per day.  He is also eating soft foods. - He finished last cycle of chemotherapy on 08/02/2017 and radiation on 08/05/2017. - PET/CT scan on 09/16/2017 shows decrease in size and metabolic activity of the primary lesion in the distal esophagus.   2.  Metastatic malignant melanoma:Pt had PET scan done 09/16/2017 that showed MPRESSION: 1. Unfortunately, multiple new hypermetabolic hepatic metastasis throughout the liver. 2. New hypermetabolic  metastatic adenopathy in the upper abdomen. 3. Persistent hypermetabolic hilar lymph nodes (reactive adenopathy versus metastatic adenopathy) 4. New hypermetabolic RIGHT lower lobe pulmonary nodule with differential including small focus infection versus pulmonary metastasis. 5. Decrease in size and metabolic activity of the primary lesion in distal esophagus.  - Biopsy of the liver lesions was done 09/28/2017 with pathology returning as malignant melanoma.  He never had a known history of melanoma resected. - Pt was seen by Dr. Worthy Keeler who discussed the prognosis of metastatic malignant melanoma and treatment with palliative intent. - Foundation 1 testing shows MS-stable, TMB-low,NRAS Q61L, MYC amplification, PDL 1 TPS of 0% - He discussed combination immunotherapy with nivolumab and ipilimumab given every 3 weeks for 4 cycles followed by monthly nivolumab.  Response rates with a combination regimen is around 60% and 3-year survival of 70%.  He also talked about single  agent nivolumab which has slightly low response rates of around 40% and 4-year survival about 50%.  Side effects of therapy were discussed.   -Brain MRI was negative for metastatic disease.  Pt was treated by Dr. Worthy Keeler with Clement Husbands and Ipi on 10/18/2017.    3.  Abnormal LFTS.  Pt was admitted from clinic due to jaundice and elevated LFTS.    MRCP was done on 11/01/2017 that was reviewed and showed  IMPRESSION: 1. Marked progression of diffuse hepatic metastatic disease compared with PET-CT of 09/16/2017. The liver is markedly enlarged and nearly completely replaced by tumor. 2. Associated metastatic lymphadenopathy in the porta hepatis and upper retroperitoneum. 3. No evidence of biliary dilatation. The gallbladder is contracted and not well visualized. Jaundice is likely secondary to intrahepatic cholestasis. 4. Small bilateral pleural effusions and mild ascites.dark urine and flank pain as well as nausea.    Pt  has been seen by GI with no additional recommendations.    Labs done 11/02/2017 reviewed and showed WBC 17.6 HB 12.3 plts 152,000.  Chemistries show bilirubin of 11 with direct bilirubin of 7.3.    No biliary dilatation or obstruction was noted on MRCP.  Suspect pt may have immune mediated hepatitis along with extensive liver disease from metastatic cancer.  He is on steroids.  Will follow chemistries.  If tolerating po may switch to prednisone daily.  Will discuss with Dr. Wynetta Emery.  He will be seen by palliative medicine for symptom support.    Pt and family updated and expressed understanding of the information presented.    4.  Colorectal cancer: - Underwent segmental colon resection in November 2016, found to have a T3N0 adenocarcinoma, with circumferential margins positive.  Patient did not receive any neoadjuvant or adjuvant chemotherapy/radiation therapy.  5  Prostate cancer: -Originally diagnosed in early 2000s, status post seed implants.  Underwent cystoprostatectomy in November 2016, pathology showed prostatic adenocarcinoma Gleason 8 (4+4).  6.  Pain:  Pt was on tramadol 2-3 times a day.  Palliative Care consulted.    7.  Nausea.  Continue IVF and antiemetics.  Pt will receive IV fluids and Phenergan in clinic.

## 2017-11-02 NOTE — Consult Note (Signed)
Consultation Note Date: 11/02/2017   Patient Name: Ian Christensen  DOB: 03/26/1931  MRN: [id number]  Age / Sex: 84 y.o., male  PCP: Elfredia Nevins, MD Referring Physician: Cleora Fleet, MD  Reason for Consultation: Establishing goals of care  HPI/Patient Profile: 84 y.o. male  with past medical history of acute hepatitis, colostomy related to rectal cancer, urostomy, history of prostate cancer, GERD, esophageal cancer, arthritis, acute kidney injury, admitted on 11/01/2017 with acute hepatitis.   Clinical Assessment and Goals of Care: Ian Christensen is resting quietly in bed.  He wakes relatively easily making and mostly keeping eye contact.  He is oriented to self only at this time.  Present today at bedside are daughters Ian Christensen and 49.  They share information from oncology related to treatment plan of time with steroid.  We talked about 24 to 48 hours for improvements.  Ian Christensen shares her concern that most recent imaging shows progression of metastatic liver cancer, "the liver is basically one large tumor now". Family states that prior to admission Ian Christensen had 24/7 care provided by CNA's.  They share their considering home with continue CNA support, possibly hospice care through [facility].  We plan for family meeting tomorrow at 1300.  Healthcare power of attorney HCPOA -paperwork noted in epic naming daughters.    SUMMARY OF RECOMMENDATIONS   24 to 48 hours for outcomes. Family is considering home with hospice versus residential hospice if no improvement.  Code Status/Advance Care Planning:  DNR  Symptom Management:   Per hospitalist, no additional needs at this time.  Palliative Prophylaxis:   Frequent Pain Assessment and Turn Reposition  Additional Recommendations (Limitations, Scope, Preferences):  At this point, continue to treat the treatable but no CPR, no  intubation.  Psycho-social/Spiritual:   Desire for further Chaplaincy support:no  Additional Recommendations: Caregiving  Support/Resources and Education on Hospice  Prognosis:   Unable to determine, based on outcomes.  Discharge Planning: To be determined, based on outcomes.      Primary Diagnoses: Present on Admission: . Acute hepatitis . Jaundice . AKI (acute kidney injury) (HCC) . 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 Hypertension . Leukocytosis . Leg DVT (deep venous thromboembolism), acute, right (HCC)   I have reviewed the medical record, interviewed the patient and family, and examined the patient. The following aspects are pertinent.  Past Medical History:  Diagnosis Date  . Acute hepatitis 11/01/2017  . AKI (acute kidney injury) (HCC) 11/01/2017  . Arthritis   . Colostomy in place [facility]   . Esophageal cancer (HCC)   . GERD (gastroesophageal reflux disease)   . History of colon polyps   . History of hiatal hernia   . 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 HTN (hypertension)    followed by pcp  . Hyperlipidemia   . Jaundice 11/01/2017  . Leukocytosis 11/01/2017  . Presence of urostomy (  HCC)   . Rectal cancer (HCC) 10/07/2014   no chemo or radiation/ bladder was removed also  . Tinnitus    per pt intermittant   . Wears glasses    Social History   Socioeconomic History  . Marital status: Widowed    Spouse name: Not on file  . Number of children: 2  . Years of education: Not on file  . Highest education level: Not on file  Occupational History  . Occupation: retired  Engineer, production  . Financial resource strain: Not very hard  . Food insecurity:    Worry: Never true    Inability: Never true  . Transportation needs:    Medical: No    Non-medical: No  Tobacco Use  . Smoking status: Never Smoker  .  Smokeless tobacco: Never Used  Substance and Sexual Activity  . Alcohol use: Yes    Alcohol/week: 0.0 standard drinks    Comment: seldom  . Drug use: No  . Sexual activity: Not Currently    Comment: Widowed  Lifestyle  . Physical activity:    Days per week: 1 day    Minutes per session: 30 min  . Stress: Not at all  Relationships  . Social connections:    Talks on phone: Three times a week    Gets together: Three times a week    Attends religious service: More than 4 times per year    Active member of club or organization: Yes    Attends meetings of clubs or organizations: More than 4 times per year    Relationship status: Widowed  Other Topics Concern  . Not on file  Social History Narrative  . Not on file   Family History  Problem Relation Age of Onset  . Diabetes Mother   . Heart disease Mother   . Heart disease Father   . Colon cancer Sister   . Prostate cancer Brother   . Esophageal cancer Neg Hx   . Rectal cancer Neg Hx   . Stomach cancer Neg Hx    Scheduled Meds: . apixaban  10 mg Oral BID   Followed by  . [START ON 10/29/2017] apixaban  5 mg Oral BID  . methylPREDNISolone (SOLU-MEDROL) injection  60 mg Intravenous Q6H  . metoprolol succinate  25 mg Oral BID  . pantoprazole  40 mg Oral Daily   Continuous Infusions: . sodium chloride 70 mL/hr at 11/02/17 0333   PRN Meds:.fentaNYL (SUBLIMAZE) injection, ondansetron **OR** ondansetron (ZOFRAN) IV, prochlorperazine, traMADol, zolpidem Medications Prior to Admission:  Prior to Admission medications   Medication Sig Start Date End Date Taking? Authorizing Provider  aspirin EC 81 MG tablet Take 162 mg by mouth at bedtime. Takes 30 minutes prior to Niacin   Yes [provider]  cyanocobalamin 500 MCG tablet Take 500 mcg by mouth daily. Vitamin B-12   Yes [provider]  Ipilimumab (YERVOY IV) Inject into the vein.   Yes [provider]  lidocaine-prilocaine (EMLA) cream Apply a quarter  size amount to port site 1 hour prior to chemo. Do not rub in. Cover with plastic wrap. 06/24/17  Yes Doreatha Massed, MD  metoprolol succinate (TOPROL-XL) 25 MG 24 hr tablet Take 25 mg by mouth 2 (two) times daily.   Yes [provider]  Nivolumab (OPDIVO IV) Inject into the vein.   Yes [provider]  omeprazole (PRILOSEC) 20 MG capsule Take 20 mg by mouth every evening.  07/23/14  Yes [provider]  prochlorperazine (COMPAZINE) 10 MG tablet Take 1 tablet (10 mg total) by mouth every 6 (six) hours as needed for nausea or vomiting. 06/24/17  Yes Doreatha Massed, MD  simvastatin (ZOCOR) 20 MG tablet Take 20 mg by mouth every evening.  12/29/15  Yes [provider]  traMADol (ULTRAM) 50 MG tablet TAKE (1) TABLET BY MOUTH EVERY 4 HOURS AS NEEDED. 10/26/17  Yes Lockamy, Randi L, NP-C  zolpidem (AMBIEN) 5 MG tablet Take 1 tablet (5 mg total) by mouth at bedtime as needed for sleep. 10/14/17 11/13/17 Yes Lockamy, Randi L, NP-C   Allergies  Allergen Reactions  . Morphine And Related     Caused hallucinations.  . Oxycodone     Caused hallucinations   Review of Systems  Unable to perform ROS: Age    Physical Exam  Constitutional: No distress.  HENT:  Head: Atraumatic.  Cardiovascular: Normal rate.  Pulmonary/Chest: Effort normal. No respiratory distress.  Abdominal: Soft.  Colostomy/urostomy site  Musculoskeletal: He exhibits no edema.  Neurological: He is alert.  oriented to self only.  Skin: Skin is warm and dry.  Psychiatric:  Calm and cooperative, not fearful  Nursing note and vitals reviewed.   Vital Signs: BP 112/63   Pulse 88   Temp 98 F (36.7 C) (Oral)   Resp 20   Ht 5\' 9"  (1.753 m)   Wt 81.1 kg   SpO2 93%   BMI 26.40 kg/m  Pain Scale: 0-10   Pain Score: Asleep   SpO2: SpO2: 93 % O2 Device:SpO2: 93 % O2 Flow Rate: .   IO: Intake/output summary:   Intake/Output Summary (Last 24 hours) at 11/02/2017 1233 Last data  filed at 11/02/2017 0800 Gross per 24 hour  Intake 1142.69 ml  Output 600 ml  Net 542.69 ml    LBM:   Baseline Weight: Weight: 81.1 kg Most recent weight: Weight: 81.1 kg     Palliative Assessment/Data:   Flowsheet Rows     Most Recent Value  Intake Tab  Referral Department  Hospitalist  Unit at Time of Referral  Med/Surg Unit  Palliative Care Primary Diagnosis  Cancer  Date Notified  11/01/17  Palliative Care Type  New Palliative care  Reason for referral  Clarify Goals of Care  Date of Admission  11/01/17  Date first seen by Palliative Care  11/02/17  # of days Palliative referral response time  1 Day(s)  # of days IP prior to Palliative referral  0  Clinical Assessment  Psychosocial & Spiritual Assessment  Palliative Care Outcomes      Time In: 1540 Time Out: 1615 Time Total: 35 minutes Greater than 50%  of this time was spent counseling and coordinating care related to the above assessment and plan.  Signed by: Katheran Awe, NP   Please contact Palliative Medicine Team phone at [phone] for questions and concerns.  For individual provider: See Loretha Stapler

## 2017-11-02 NOTE — Care Management (Signed)
CM consult noted for home health.  Patient presenting with failure to thrive, weakness, and jaundice. Elevated LFTs secondary to diffuse hepatic metastatic disease that has progressed since Sep 16, 2017.  Palliative has been consulted. CM will follow for needs and assist as needed.

## 2017-11-02 NOTE — Care Management Obs Status (Signed)
[facility] NOTIFICATION   Patient Details  Name: TRYSTIAN CRISANTO MRN: [id number] Date of Birth: [birth year]   Medicare Observation Status Notification Given:  Yes    Tiler Brandis, Chauncey Reading, RN 11/02/2017, 7:41 AM

## 2017-11-02 NOTE — Progress Notes (Signed)
PROGRESS NOTE    Ian Christensen  WJX:[id number]  DOB: 06-20-1931  DOA: 11/01/2017 PCP: Elfredia Nevins, MD   Brief Admission Hx: Ian Christensen is a 84 y.o. 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   MDM/Assessment & Plan:   1. Acute hepatitis - hepatitis serology is pending, he is being treated supportively.  His bilirubin is slightly more elevated today but patient overall is feeling better.  His liver enzymes are slightly improved.  He is receiving IV steroids per the oncology team and GI is following him.  2. Acute DVT of right leg - He has been started on apixaban dosed by pharmacy team for full anticoagulation.  Monitor for bleeding.  3.  Primary squamous cell carcinoma of the esophagus -patient is being followed and treated by the oncology team.  He recently started immunotherapy.  Oncology is planning to see him later today. 4. Jaundice- secondary to hyperbilirubinemia-patient currently asymptomatic from this. 5. Leukocytosis-no signs of infection have been found, his white blood cell count is down today.  He is afebrile. 6. Acute kidney injury-likely prerenal from poor oral intake-improving with IV fluid hydration. 7. Essential hypertension-holding losartan due to acute renal injury but have resumed metoprolol.  Follow blood pressure closely. 8. Hyponatremia-multifactorial, likely some SIADH, dehydration and ever disease contributing.  Follow. 9. DNR.  DVT prophylaxis: Eliquis Code Status: DNR Family Communication: Discussed at bedside Disposition Plan: Continue inpatient treatment with IV fluids and supportive care   Consultants:  Oncology  GI    Subjective: The patient is sitting up in the bed and he is eating breakfast.  He says he feels better today.  He says that he is in no  pain.  Objective: Vitals:   11/01/17 1210 11/01/17 2147 11/02/17 0530  BP:  137/70 112/63  Pulse:  83 88  Resp:  18 20  Temp:  97.6 F (36.4 C) 98 F (36.7 C)  TempSrc:  Oral Oral  SpO2:  96% 93%  Weight: 81.1 kg    Height: 5\' 9"  (1.753 m)      Intake/Output Summary (Last 24 hours) at 11/02/2017 1200 Last data filed at 11/02/2017 0800 Gross per 24 hour  Intake 1142.69 ml  Output 600 ml  Net 542.69 ml   Filed Weights   11/01/17 1210  Weight: 81.1 kg     REVIEW OF SYSTEMS  As per history otherwise all reviewed and reported negative  Exam:  General exam: Chronically ill-appearing male who is sitting up in the bed, he is alert and oriented, he is jaundiced.  He is in no apparent distress. Respiratory system: Bilateral breath sounds clear.  No increased work of breathing. Cardiovascular system: Normal S1 & S2 heard. . Gastrointestinal system: Abdomen is nondistended, soft and nontender. Normal bowel sounds heard. Central nervous system: Alert and oriented. No focal neurological deficits. Extremities: Edema of the right lower extremity unchanged, normal lower extremity pulses bilateral.  Data Reviewed: Basic Metabolic Panel: Recent Labs  Lab 11/01/17 0836 11/02/17 0439  NA 126* 127*  K 4.3 4.4  CL 94* 95*  CO2 18* 19*  GLUCOSE 130* 133*  BUN 42* 49*  CREATININE 1.34* 1.22  CALCIUM 8.6* 8.3*  MG  --  2.6*   Liver Function Tests: Recent Labs  Lab 11/01/17 0836 11/02/17 0439  AST 216* 189*  ALT 167* 154*  ALKPHOS  929* 884*  BILITOT 10.6* 11.5*  PROT 6.3* 5.9*  ALBUMIN 2.7* 2.4*   No results for input(s): LIPASE, AMYLASE in the last 168 hours. No results for input(s): AMMONIA in the last 168 hours. CBC: Recent Labs  Lab 11/01/17 0836 11/02/17 0439  WBC 20.3* 17.6*  NEUTROABS 17.4* 16.3*  HGB 12.9* 12.3*  HCT 37.8* 35.8*  MCV 93.6 93.7  PLT 171 152   Cardiac Enzymes: No results for input(s): CKTOTAL, CKMB, CKMBINDEX, TROPONINI in the last 168  hours. CBG (last 3)  No results for input(s): GLUCAP in the last 72 hours. No results found for this or any previous visit (from the past 240 hour(s)).   Studies: Mr 3d Recon At Scanner  Result Date: 11/01/2017 CLINICAL DATA:  Jaundice with left flank pain, generalized malaise and increasing weakness. History of squamous cell esophageal cancer, rectal cancer and remote prostate cancer. Undergoing immunotherapy. EXAM: MRI ABDOMEN WITHOUT AND WITH CONTRAST (INCLUDING MRCP) TECHNIQUE: Multiplanar multisequence MR imaging of the abdomen was performed both before and after the administration of intravenous contrast. Heavily T2-weighted images of the biliary and pancreatic ducts were obtained, and three-dimensional MRCP images were rendered by post processing. CONTRAST:  7.5 cc Gadavist COMPARISON:  Abdominopelvic CT 03/05/2017.  PET-CT 09/16/2017. FINDINGS: Despite efforts by the technologist and patient, mild motion artifact is present on today's exam and could not be eliminated. This reduces exam sensitivity and specificity. Lower chest: There are small bilateral pleural effusions with mild dependent atelectasis at both lung bases. Hepatobiliary: Interval marked enlargement of the liver which measures approximately 25.2 x 16.5 x 29.4 cm. There are innumerable hepatic lesions which have markedly progressed compared with the PET-CT of 6 weeks ago. Most of the lesions demonstrate low T1 signal, although there are a few lesions with intrinsic T1 shortening. There is heterogeneous T2 signal with areas of hyperintensity. Following contrast, some of the lesions demonstrate heterogeneous peripheral enhancement. Many of the lesions appear necrotic without central enhancement. The vast majority of the hepatic parenchyma is replaced by tumor. The gallbladder appears contracted and is not well visualized. There is no extrahepatic biliary dilatation. Pancreas: Unremarkable. No pancreatic ductal dilatation or surrounding  inflammatory changes. Spleen: Normal in size without focal abnormality. Adrenals/Urinary Tract: Both adrenal glands appear normal. There is a simple cyst measuring 2.8 cm in the upper pole the left kidney. No evidence of enhancing renal mass or hydronephrosis. Stomach/Bowel: No evidence of bowel wall thickening, distention or surrounding inflammatory change. Vascular/Lymphatic: There are multiple enlarged lymph nodes within the porta hepatis and upper retroperitoneum, most obvious on the diffusion-weighted images. These were hypermetabolic on previous PET-CT. No acute vascular findings identified. Other: Small amount of perihepatic ascites. Musculoskeletal: Although the marrow appears heterogeneous on diffusion weighted imaging, no focal lesions or abnormal enhancement identified within the spine on T2 weighted or post-contrast imaging. A simple lipoma is noted posterior to the left scapular tip. IMPRESSION: 1. Marked progression of diffuse hepatic metastatic disease compared with PET-CT of 09/16/2017. The liver is markedly enlarged and nearly completely replaced by tumor. 2. Associated metastatic lymphadenopathy in the porta hepatis and upper retroperitoneum. 3. No evidence of biliary dilatation. The gallbladder is contracted and not well visualized. Jaundice is likely secondary to intrahepatic cholestasis. 4. Small bilateral pleural effusions and mild ascites. Electronically Signed   By: Carey Bullocks M.D.   On: 11/01/2017 14:42   US Venous Img Lower Unilateral Right  Result Date: 11/02/2017 CLINICAL DATA:  Right lower extremity edema. EXAM: RIGHT LOWER EXTREMITY  VENOUS DOPPLER ULTRASOUND TECHNIQUE: Gray-scale sonography with graded compression, as well as color Doppler and duplex ultrasound were performed to evaluate the lower extremity deep venous systems from the level of the common femoral vein and including the common femoral, femoral, profunda femoral, popliteal and calf veins including the posterior  tibial, peroneal and gastrocnemius veins when visible. The superficial great saphenous vein was also interrogated. Spectral Doppler was utilized to evaluate flow at rest and with distal augmentation maneuvers in the common femoral, femoral and popliteal veins. COMPARISON:  None. FINDINGS: Contralateral Common Femoral Vein: Respiratory phasicity is normal and symmetric with the symptomatic side. No evidence of thrombus. Normal compressibility. Common Femoral Vein: Occlusive thrombus present up to the saphenofemoral junction. The saphenofemoral junction itself is open. Saphenofemoral Junction: No evidence of thrombus. Profunda Femoral Vein: No evidence of thrombus. Normal compressibility and flow on color Doppler imaging. Femoral Vein: Occlusive thrombus present throughout the femoral vein in the thigh. Popliteal Vein: Occlusive thrombus present. Calf Veins: Thrombus extends into posterior tibial and peroneal veins in the calf. Superficial Great Saphenous Vein: No evidence of thrombus. Normal compressibility. Venous Reflux:  None. Other Findings: No evidence of superficial thrombophlebitis or abnormal fluid collection. IMPRESSION: Positive for right lower extremity deep venous thrombosis affecting the common femoral vein, femoral vein, popliteal vein and visualized proximal calf veins. Thrombus appears acute/subacute by ultrasound. Electronically Signed   By: Irish Lack M.D.   On: 11/02/2017 10:04   Dg Chest Port 1 View  Result Date: 11/01/2017 CLINICAL DATA:  Acute hepatitis. EXAM: PORTABLE CHEST 1 VIEW COMPARISON:  Chest x-ray dated July 05, 2017. FINDINGS: Unchanged left chest wall port catheter with tip in the mid SVC. The heart size and mediastinal contours are within normal limits. Normal pulmonary vascularity. No focal consolidation, pleural effusion, or pneumothorax. No acute osseous abnormality. IMPRESSION: No active disease. Electronically Signed   By: Obie Dredge M.D.   On: 11/01/2017 13:40    Mr Abdomen Mrcp Vivien Rossetti Contast  Result Date: 11/01/2017 CLINICAL DATA:  Jaundice with left flank pain, generalized malaise and increasing weakness. History of squamous cell esophageal cancer, rectal cancer and remote prostate cancer. Undergoing immunotherapy. EXAM: MRI ABDOMEN WITHOUT AND WITH CONTRAST (INCLUDING MRCP) TECHNIQUE: Multiplanar multisequence MR imaging of the abdomen was performed both before and after the administration of intravenous contrast. Heavily T2-weighted images of the biliary and pancreatic ducts were obtained, and three-dimensional MRCP images were rendered by post processing. CONTRAST:  7.5 cc Gadavist COMPARISON:  Abdominopelvic CT 03/05/2017.  PET-CT 09/16/2017. FINDINGS: Despite efforts by the technologist and patient, mild motion artifact is present on today's exam and could not be eliminated. This reduces exam sensitivity and specificity. Lower chest: There are small bilateral pleural effusions with mild dependent atelectasis at both lung bases. Hepatobiliary: Interval marked enlargement of the liver which measures approximately 25.2 x 16.5 x 29.4 cm. There are innumerable hepatic lesions which have markedly progressed compared with the PET-CT of 6 weeks ago. Most of the lesions demonstrate low T1 signal, although there are a few lesions with intrinsic T1 shortening. There is heterogeneous T2 signal with areas of hyperintensity. Following contrast, some of the lesions demonstrate heterogeneous peripheral enhancement. Many of the lesions appear necrotic without central enhancement. The vast majority of the hepatic parenchyma is replaced by tumor. The gallbladder appears contracted and is not well visualized. There is no extrahepatic biliary dilatation. Pancreas: Unremarkable. No pancreatic ductal dilatation or surrounding inflammatory changes. Spleen: Normal in size without focal abnormality. Adrenals/Urinary Tract: Both  adrenal glands appear normal. There is a simple cyst  measuring 2.8 cm in the upper pole the left kidney. No evidence of enhancing renal mass or hydronephrosis. Stomach/Bowel: No evidence of bowel wall thickening, distention or surrounding inflammatory change. Vascular/Lymphatic: There are multiple enlarged lymph nodes within the porta hepatis and upper retroperitoneum, most obvious on the diffusion-weighted images. These were hypermetabolic on previous PET-CT. No acute vascular findings identified. Other: Small amount of perihepatic ascites. Musculoskeletal: Although the marrow appears heterogeneous on diffusion weighted imaging, no focal lesions or abnormal enhancement identified within the spine on T2 weighted or post-contrast imaging. A simple lipoma is noted posterior to the left scapular tip. IMPRESSION: 1. Marked progression of diffuse hepatic metastatic disease compared with PET-CT of 09/16/2017. The liver is markedly enlarged and nearly completely replaced by tumor. 2. Associated metastatic lymphadenopathy in the porta hepatis and upper retroperitoneum. 3. No evidence of biliary dilatation. The gallbladder is contracted and not well visualized. Jaundice is likely secondary to intrahepatic cholestasis. 4. Small bilateral pleural effusions and mild ascites. Electronically Signed   By: Carey Bullocks M.D.   On: 11/01/2017 14:42   Scheduled Meds: . apixaban  10 mg Oral BID   Followed by  . [START ON 11/11/2017] apixaban  5 mg Oral BID  . methylPREDNISolone (SOLU-MEDROL) injection  60 mg Intravenous Q6H  . metoprolol succinate  25 mg Oral BID  . pantoprazole  40 mg Oral Daily   Continuous Infusions: . 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

## 2017-11-03 LAB — COMPREHENSIVE METABOLIC PANEL
ALT: 139 U/L — AB (ref 0–44)
ANION GAP: 9 (ref 5–15)
AST: 158 U/L — ABNORMAL HIGH (ref 15–41)
Albumin: 2.3 g/dL — ABNORMAL LOW (ref 3.5–5.0)
Alkaline Phosphatase: 829 U/L — ABNORMAL HIGH (ref 38–126)
BUN: 53 mg/dL — ABNORMAL HIGH (ref 8–23)
CALCIUM: 8 mg/dL — AB (ref 8.9–10.3)
CO2: 20 mmol/L — ABNORMAL LOW (ref 22–32)
CREATININE: 1.19 mg/dL (ref 0.61–1.24)
Chloride: 100 mmol/L (ref 98–111)
GFR, EST NON AFRICAN AMERICAN: 54 mL/min — AB (ref >60)
Glucose, Bld: 129 mg/dL — ABNORMAL HIGH (ref 70–99)
Potassium: 4.2 mmol/L (ref 3.5–5.1)
SODIUM: 129 mmol/L — AB (ref 135–145)
Total Bilirubin: 10.8 mg/dL — ABNORMAL HIGH (ref 0.3–1.2)
Total Protein: 5.4 g/dL — ABNORMAL LOW (ref 6.5–8.1)

## 2017-11-03 LAB — CBC WITH DIFFERENTIAL/PLATELET
Abs Immature Granulocytes: 0.73 10*3/uL — ABNORMAL HIGH (ref 0.00–0.07)
BASOS PCT: 0 %
Basophils Absolute: 0 10*3/uL (ref 0.0–0.1)
EOS ABS: 0 10*3/uL (ref 0.0–0.5)
EOS PCT: 0 %
HCT: 34.6 % — ABNORMAL LOW (ref 39.0–52.0)
Hemoglobin: 11.8 g/dL — ABNORMAL LOW (ref 13.0–17.0)
Immature Granulocytes: 3 %
Lymphocytes Relative: 2 %
Lymphs Abs: 0.4 10*3/uL — ABNORMAL LOW (ref 0.7–4.0)
MCH: 31.5 pg (ref 26.0–34.0)
MCHC: 34.1 g/dL (ref 30.0–36.0)
MCV: 92.3 fL (ref 80.0–100.0)
MONO ABS: 1.2 10*3/uL — AB (ref 0.1–1.0)
Monocytes Relative: 5 %
NRBC: 0 % (ref 0.0–0.2)
Neutro Abs: 21 10*3/uL — ABNORMAL HIGH (ref 1.7–7.7)
Neutrophils Relative %: 90 %
PLATELETS: 162 10*3/uL (ref 150–400)
RBC: 3.75 MIL/uL — ABNORMAL LOW (ref 4.22–5.81)
RDW: 16.3 % — AB (ref 11.5–15.5)
WBC: 23.4 10*3/uL — AB (ref 4.0–10.5)

## 2017-11-03 LAB — PROTIME-INR
INR: 1.74
Prothrombin Time: 20.2 seconds — ABNORMAL HIGH (ref 11.4–15.2)

## 2017-11-03 LAB — MAGNESIUM: MAGNESIUM: 2.7 mg/dL — AB (ref 1.7–2.4)

## 2017-11-03 MED ORDER — CHLORPROMAZINE HCL 25 MG PO TABS
25.0000 mg | ORAL_TABLET | Freq: Three times a day (TID) | ORAL | Status: DC | PRN
  Administered 2017-11-03: 25 mg via ORAL
  Filled 2017-11-03 (×2): qty 1

## 2017-11-03 MED ORDER — HYDRALAZINE HCL 20 MG/ML IJ SOLN: 10.0000 mg | INTRAMUSCULAR | Status: DC | PRN

## 2017-11-03 MED ORDER — PREDNISONE 20 MG PO TABS
80.0000 mg | ORAL_TABLET | Freq: Every day | ORAL | Status: DC
  Administered 2017-11-03 – 2017-11-04 (×2): 80 mg via ORAL
  Filled 2017-11-03 (×2): qty 4

## 2017-11-03 MED ORDER — AMLODIPINE BESYLATE 5 MG PO TABS
5.0000 mg | ORAL_TABLET | Freq: Every day | ORAL | Status: DC
  Administered 2017-11-03 – 2017-11-04 (×2): 5 mg via ORAL
  Filled 2017-11-03 (×2): qty 1

## 2017-11-03 NOTE — Progress Notes (Signed)
Palliative: Ian Christensen is sitting up in bed.  He greets me making and keeping eye contact.  He is calm and cooperative, not fearful.  He is oriented to self only at this time present today at bedside are daughters, Ian Christensen and Ian Christensen.   We talk about oncology visit and recommendation.  Daughters were able to correctly voice information provided by Dr. Walden Field.  We talk about the benefits of residential Hospice.  We talk about what is and is not given. Family states they work about hallucinations rt pain meds/ morphine.  We note that he is now having hallucinations.   Conference with oncology Dr. Walden Field who shares that there is no further palliative treatments offered, but is recommened to continue steroid.  I also discuss the use of steroids to help with symptom management with daughters.  Family feels that Ian Christensen would benefit from more skilled care than they are able to provide at home even with 24/7 CNA help.  Consult with Social worker related to placement, family desire to go to [facility] with the benefit of Ian Christensen's long term care policy.  I encouraged family to contact the long term policy. They agree.  We talked about the concept of do not rehospitalize.  We talked about how they will care for Ian Christensen when he gets sick again.  Prognosis discussed with family with permission.  6-8 weeks or less would not be surprising based on frailty, poor by mouth intake, advancing cancer.   Family is requesting information be sent to hospice of [facility] for in facility follow-up and possibility/likelihood of residential hospice in the future.  Conference with hospitalist related to plan of care, oncology consult, disposition plan.  65 minutes Quinn Axe, NP Palliative Medicine Team Team Phone # [phone]  Greater than 50% of this time was spent counseling and coordinating care related to the above assessment and plan.

## 2017-11-03 NOTE — Clinical Social Work Note (Signed)
Per palliative care NP request, a referral was made to [place] [facility].    Risa Auman, Clydene Pugh, LCSW

## 2017-11-03 NOTE — Progress Notes (Signed)
PROGRESS NOTE  Ian Christensen  NFA:[id number]  DOB: August 26, 1931  DOA: 11/01/2017 PCP: Ian Nevins, MD   Brief Admission Hx: Ian Christensen is a 84 y.o. 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   MDM/Assessment & Plan:   1. Acute hepatitis - hepatitis serology ruled out infectious causes, likely from overwhelming tumor burden, he is being treated supportively.  His bilirubin remains around 11 but patient overall is feeling better.  His liver enzymes have remained essential unchanged since starting the steroids.   He received IV steroids per the oncology team.  I spoke with Ian Christensen today and can switch him to oral prednisone 80 mg daily.   2. Acute DVT of right leg - He has been started on apixaban dosed by pharmacy team for full anticoagulation.  Monitor for bleeding.  I spoke with Ian Christensen about this.  Continue the apixaban for now.   3. 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  Palliative medicine consulted and planning goals of care meeting later today.  4. Jaundice- secondary to hyperbilirubinemia-patient currently asymptomatic from this. 5. Leukocytosis-no signs of infection have been found, exacerbated by IV steroids.  He is afebrile. 6. Acute kidney injury-likely prerenal from poor oral intake-improved with IV fluid hydration. 7. Essential hypertension-continue metoprolol.  Follow blood pressures. 8. Hyponatremia-multifactorial, likely some SIADH, dehydration and ever disease contributing.  Follow. 9. DNR.  DVT prophylaxis: Eliquis Code Status: DNR Family Communication: Discussed with family at bedside Disposition Plan: Continue inpatient treatment with IV fluids and  supportive care  Consultants:  Oncology  GI  Subjective: The patient is having some hiccups.  His right leg is less swollen today.    Objective: Vitals:   11/02/17 0530 11/02/17 1345 11/02/17 2225 11/03/17 0518  BP: 112/63 136/75 (!) 141/78 (!) 157/72  Pulse: 88 85 82 79  Resp: 20 18 20    Temp: 98 F (36.7 C) (!) 97.5 F (36.4 C) 98.3 F (36.8 C) 98.4 F (36.9 C)  TempSrc: Oral Oral Oral Oral  SpO2: 93% 90% 93% 96%  Weight:      Height:        Intake/Output Summary (Last 24 hours) at 11/03/2017 1307 Last data filed at 11/03/2017 1010 Gross per 24 hour  Intake 2382.99 ml  Output 1400 ml  Net 982.99 ml   Filed Weights   11/01/17 1210  Weight: 81.1 kg   REVIEW OF SYSTEMS  As per history otherwise all reviewed and reported negative  Exam:  General exam: Chronically ill-appearing male who is sitting up in the bed, he is alert and oriented, he is jaundiced.  He is in no apparent distress. Respiratory system: Bilateral breath sounds clear.  No increased work of breathing. Cardiovascular system: Normal S1 & S2 heard. . Gastrointestinal system: Abdomen is mildly distended, soft and nontender. Normal bowel sounds heard. Central nervous system: Alert and oriented. No focal neurological deficits. Extremities: Edema of the right lower extremity, normal lower extremity pulses bilateral.  Data Reviewed: Basic Metabolic Panel: Recent Labs  Lab 11/01/17 0836 11/02/17 0439 11/03/17 0443  NA 126* 127* 129*  K 4.3 4.4 4.2  CL 94* 95* 100  CO2 18* 19* 20*  GLUCOSE 130* 133* 129*  BUN 42* 49* 53*  CREATININE 1.34*  1.22 1.19  CALCIUM 8.6* 8.3* 8.0*  MG  --  2.6* 2.7*   Liver Function Tests: Recent Labs  Lab 11/01/17 0836 11/02/17 0439 11/02/17 1434 11/03/17 0443  AST 216* 189* 174* 158*  ALT 167* 154* 147* 139*  ALKPHOS 929* 884* 873* 829*  BILITOT 10.6* 11.5* 11.0* 10.8*  PROT 6.3* 5.9* 5.7* 5.4*  ALBUMIN 2.7* 2.4* 2.4* 2.3*   No results for input(s):  LIPASE, AMYLASE in the last 168 hours. No results for input(s): AMMONIA in the last 168 hours. CBC: Recent Labs  Lab 11/01/17 0836 11/02/17 0439 11/03/17 0443  WBC 20.3* 17.6* 23.4*  NEUTROABS 17.4* 16.3* 21.0*  HGB 12.9* 12.3* 11.8*  HCT 37.8* 35.8* 34.6*  MCV 93.6 93.7 92.3  PLT 171 152 162   Cardiac Enzymes: No results for input(s): CKTOTAL, CKMB, CKMBINDEX, TROPONINI in the last 168 hours. CBG (last 3)  No results for input(s): GLUCAP in the last 72 hours. No results found for this or any previous visit (from the past 240 hour(s)).   Studies: Mr 3d Recon At Scanner  Result Date: 11/01/2017 CLINICAL DATA:  Jaundice with left flank pain, generalized malaise and increasing weakness. History of squamous cell esophageal cancer, rectal cancer and remote prostate cancer. Undergoing immunotherapy. EXAM: MRI ABDOMEN WITHOUT AND WITH CONTRAST (INCLUDING MRCP) TECHNIQUE: Multiplanar multisequence MR imaging of the abdomen was performed both before and after the administration of intravenous contrast. Heavily T2-weighted images of the biliary and pancreatic ducts were obtained, and three-dimensional MRCP images were rendered by post processing. CONTRAST:  7.5 cc Gadavist COMPARISON:  Abdominopelvic CT 03/05/2017.  PET-CT 09/16/2017. FINDINGS: Despite efforts by the technologist and patient, mild motion artifact is present on today's exam and could not be eliminated. This reduces exam sensitivity and specificity. Lower chest: There are small bilateral pleural effusions with mild dependent atelectasis at both lung bases. Hepatobiliary: Interval marked enlargement of the liver which measures approximately 25.2 x 16.5 x 29.4 cm. There are innumerable hepatic lesions which have markedly progressed compared with the PET-CT of 6 weeks ago. Most of the lesions demonstrate low T1 signal, although there are a few lesions with intrinsic T1 shortening. There is heterogeneous T2 signal with areas of  hyperintensity. Following contrast, some of the lesions demonstrate heterogeneous peripheral enhancement. Many of the lesions appear necrotic without central enhancement. The vast majority of the hepatic parenchyma is replaced by tumor. The gallbladder appears contracted and is not well visualized. There is no extrahepatic biliary dilatation. Pancreas: Unremarkable. No pancreatic ductal dilatation or surrounding inflammatory changes. Spleen: Normal in size without focal abnormality. Adrenals/Urinary Tract: Both adrenal glands appear normal. There is a simple cyst measuring 2.8 cm in the upper pole the left kidney. No evidence of enhancing renal mass or hydronephrosis. Stomach/Bowel: No evidence of bowel wall thickening, distention or surrounding inflammatory change. Vascular/Lymphatic: There are multiple enlarged lymph nodes within the porta hepatis and upper retroperitoneum, most obvious on the diffusion-weighted images. These were hypermetabolic on previous PET-CT. No acute vascular findings identified. Other: Small amount of perihepatic ascites. Musculoskeletal: Although the marrow appears heterogeneous on diffusion weighted imaging, no focal lesions or abnormal enhancement identified within the spine on T2 weighted or post-contrast imaging. A simple lipoma is noted posterior to the left scapular tip. IMPRESSION: 1. Marked progression of diffuse hepatic metastatic disease compared with PET-CT of 09/16/2017. The liver is markedly enlarged and nearly completely replaced by tumor. 2. Associated metastatic lymphadenopathy in the porta hepatis and upper retroperitoneum. 3. No evidence of biliary  dilatation. The gallbladder is contracted and not well visualized. Jaundice is likely secondary to intrahepatic cholestasis. 4. Small bilateral pleural effusions and mild ascites. Electronically Signed   By: Carey Bullocks M.D.   On: 11/01/2017 14:42   US Venous Img Lower Unilateral Right  Result Date:  11/02/2017 CLINICAL DATA:  Right lower extremity edema. EXAM: RIGHT LOWER EXTREMITY VENOUS DOPPLER ULTRASOUND TECHNIQUE: Gray-scale sonography with graded compression, as well as color Doppler and duplex ultrasound were performed to evaluate the lower extremity deep venous systems from the level of the common femoral vein and including the common femoral, femoral, profunda femoral, popliteal and calf veins including the posterior tibial, peroneal and gastrocnemius veins when visible. The superficial great saphenous vein was also interrogated. Spectral Doppler was utilized to evaluate flow at rest and with distal augmentation maneuvers in the common femoral, femoral and popliteal veins. COMPARISON:  None. FINDINGS: Contralateral Common Femoral Vein: Respiratory phasicity is normal and symmetric with the symptomatic side. No evidence of thrombus. Normal compressibility. Common Femoral Vein: Occlusive thrombus present up to the saphenofemoral junction. The saphenofemoral junction itself is open. Saphenofemoral Junction: No evidence of thrombus. Profunda Femoral Vein: No evidence of thrombus. Normal compressibility and flow on color Doppler imaging. Femoral Vein: Occlusive thrombus present throughout the femoral vein in the thigh. Popliteal Vein: Occlusive thrombus present. Calf Veins: Thrombus extends into posterior tibial and peroneal veins in the calf. Superficial Great Saphenous Vein: No evidence of thrombus. Normal compressibility. Venous Reflux:  None. Other Findings: No evidence of superficial thrombophlebitis or abnormal fluid collection. IMPRESSION: Positive for right lower extremity deep venous thrombosis affecting the common femoral vein, femoral vein, popliteal vein and visualized proximal calf veins. Thrombus appears acute/subacute by ultrasound. Electronically Signed   By: Irish Lack M.D.   On: 11/02/2017 10:04   Dg Chest Port 1 View  Result Date: 11/01/2017 CLINICAL DATA:  Acute hepatitis.  EXAM: PORTABLE CHEST 1 VIEW COMPARISON:  Chest x-ray dated July 05, 2017. FINDINGS: Unchanged left chest wall port catheter with tip in the mid SVC. The heart size and mediastinal contours are within normal limits. Normal pulmonary vascularity. No focal consolidation, pleural effusion, or pneumothorax. No acute osseous abnormality. IMPRESSION: No active disease. Electronically Signed   By: Obie Dredge M.D.   On: 11/01/2017 13:40   Mr Abdomen Mrcp Vivien Rossetti Contast  Result Date: 11/01/2017 CLINICAL DATA:  Jaundice with left flank pain, generalized malaise and increasing weakness. History of squamous cell esophageal cancer, rectal cancer and remote prostate cancer. Undergoing immunotherapy. EXAM: MRI ABDOMEN WITHOUT AND WITH CONTRAST (INCLUDING MRCP) TECHNIQUE: Multiplanar multisequence MR imaging of the abdomen was performed both before and after the administration of intravenous contrast. Heavily T2-weighted images of the biliary and pancreatic ducts were obtained, and three-dimensional MRCP images were rendered by post processing. CONTRAST:  7.5 cc Gadavist COMPARISON:  Abdominopelvic CT 03/05/2017.  PET-CT 09/16/2017. FINDINGS: Despite efforts by the technologist and patient, mild motion artifact is present on today's exam and could not be eliminated. This reduces exam sensitivity and specificity. Lower chest: There are small bilateral pleural effusions with mild dependent atelectasis at both lung bases. Hepatobiliary: Interval marked enlargement of the liver which measures approximately 25.2 x 16.5 x 29.4 cm. There are innumerable hepatic lesions which have markedly progressed compared with the PET-CT of 6 weeks ago. Most of the lesions demonstrate low T1 signal, although there are a few lesions with intrinsic T1 shortening. There is heterogeneous T2 signal with areas of hyperintensity. Following contrast, some of  the lesions demonstrate heterogeneous peripheral enhancement. Many of the lesions appear  necrotic without central enhancement. The vast majority of the hepatic parenchyma is replaced by tumor. The gallbladder appears contracted and is not well visualized. There is no extrahepatic biliary dilatation. Pancreas: Unremarkable. No pancreatic ductal dilatation or surrounding inflammatory changes. Spleen: Normal in size without focal abnormality. Adrenals/Urinary Tract: Both adrenal glands appear normal. There is a simple cyst measuring 2.8 cm in the upper pole the left kidney. No evidence of enhancing renal mass or hydronephrosis. Stomach/Bowel: No evidence of bowel wall thickening, distention or surrounding inflammatory change. Vascular/Lymphatic: There are multiple enlarged lymph nodes within the porta hepatis and upper retroperitoneum, most obvious on the diffusion-weighted images. These were hypermetabolic on previous PET-CT. No acute vascular findings identified. Other: Small amount of perihepatic ascites. Musculoskeletal: Although the marrow appears heterogeneous on diffusion weighted imaging, no focal lesions or abnormal enhancement identified within the spine on T2 weighted or post-contrast imaging. A simple lipoma is noted posterior to the left scapular tip. IMPRESSION: 1. Marked progression of diffuse hepatic metastatic disease compared with PET-CT of 09/16/2017. The liver is markedly enlarged and nearly completely replaced by tumor. 2. Associated metastatic lymphadenopathy in the porta hepatis and upper retroperitoneum. 3. No evidence of biliary dilatation. The gallbladder is contracted and not well visualized. Jaundice is likely secondary to intrahepatic cholestasis. 4. Small bilateral pleural effusions and mild ascites. Electronically Signed   By: Carey Bullocks M.D.   On: 11/01/2017 14:42   Scheduled Meds: . apixaban  10 mg Oral BID   Followed by  . [START ON 11/28/2017] apixaban  5 mg Oral BID  . metoprolol succinate  25 mg Oral BID  . pantoprazole  40 mg Oral Daily  . predniSONE  80  mg Oral Q1500   Continuous Infusions: . sodium chloride 35 mL/hr at 11/03/17 1010    Principal Problem:   Acute hepatitis Active Problems:   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)   Jaundice   Leg DVT (deep venous thromboembolism), acute, right (HCC)   AKI (acute kidney injury) (HCC)   Hypertension   Esophageal dysphagia   Leukocytosis   Palliative care by specialist   Encounter for hospice care discussion  Time spent:   Standley Dakins, MD, FAAFP [facility] Pager [phone]  If 7PM-7AM, please contact night-coverage [email or web address] Password [email or web address] 11/03/2017, 1:07 PM    LOS: 2 days

## 2017-11-03 NOTE — Progress Notes (Signed)
Diagnosis Acute hepatitis - Plan: DG CHEST PORT 1 VIEW, DG CHEST PORT 1 VIEW  Pain - Plan: MR 3D Recon At Scanner, MR 3D Recon At Scanner  Edema - Plan: US Venous Img Lower Unilateral Right, US Venous Img Lower Unilateral Right  Staging Cancer Staging Rectal cancer [facility]) Staging form: Colon and Rectum, AJCC 8th Edition - Pathologic stage from 05/31/2016: Stage IIA (pT3, pN0, cM0) - Signed by Baird Cancer, PA-C on 05/31/2016   Subjective: Pt alert but confused.  Family at bedside.   Objective: Vitals Patient Vitals for the past 24 hrs:  BP Temp Temp src Pulse Resp SpO2  11/03/17 1423 123/63 98 F (36.7 C) Oral 84 16 91 %  11/03/17 1334 (!) 146/68 97.8 F (36.6 C) Oral 75 16 95 %  11/03/17 0518 (!) 157/72 98.4 F (36.9 C) Oral 79 - 96 %  11/02/17 2225 (!) 141/78 98.3 F (36.8 C) Oral 82 20 93 %    Physical Exam Constitutional: Well-developed, well-nourished, and in no distress.   HENT: Head: Normocephalic and atraumatic.  Mouth/Throat: No oropharyngeal exudate. Mucosa moist. Eyes: Pupils are equal, round, and reactive to light. Conjunctivae are normal. + scleral icterus.  Neck: Normal range of motion. Neck supple. No JVD present.  Cardiovascular: Normal rate, regular rhythm and normal heart sounds.  Exam reveals no gallop and no friction rub.   No murmur heard. Pulmonary/Chest: Effort normal and breath sounds normal. No respiratory distress. No wheezes.No rales.  Abdominal: Soft. Bowel sounds are normal. No distension. There is no tenderness. There is no guarding. Tubes in place Musculoskeletal: No edema or tenderness.  Lymphadenopathy: No cervica, axillaryl or supraclavicular adenopathy.  Neurological: Alert and oriented to person, place, and time. No cranial nerve deficit.  Skin: Skin is warm and dry. No rash noted. No erythema. No pallor.  Psychiatric: Affect and judgment normal.    Medications   Allergies Morphine and related and Oxycodone  Review of  Systems Review of Systems - Oncology ROS as per HPI otherwise 12 point ROS is negative.   Labs Admission on 11/01/2017  Component Date Value Ref Range Status  . Hepatitis B Surface Ag 11/01/2017 Negative  Negative Final  . HCV Ab 11/01/2017 <0.1  0.0 - 0.9 s/co ratio Final   Comment: (NOTE)                                  Negative:     < 0.8                             Indeterminate: 0.8 - 0.9                                  Positive:     > 0.9 The CDC recommends that a positive HCV antibody result be followed up with a HCV Nucleic Acid Amplification test ([id number]). Performed At: [facility] [place], [place] [id number] Rush Farmer MD JS:[id number]   . Hep A IgM 11/01/2017 Negative  Negative Final  . Hep B C IgM 11/01/2017 Negative  Negative Final  . Prothrombin Time 11/01/2017 14.4  11.4 - 15.2 seconds Final  . INR 11/01/2017 1.13   Final   Performed at [facility], [address], [place], [place] [address]  . Sodium 11/02/2017 127*  135 - 145 mmol/L Final  . Potassium 11/02/2017 4.4  3.5 - 5.1 mmol/L Final  . Chloride 11/02/2017 95* 98 - 111 mmol/L Final  . CO2 11/02/2017 19* 22 - 32 mmol/L Final  . Glucose, Bld 11/02/2017 133* 70 - 99 mg/dL Final  . BUN 11/02/2017 49* 8 - 23 mg/dL Final  . Creatinine, Ser 11/02/2017 1.22  0.61 - 1.24 mg/dL Final  . Calcium 11/02/2017 8.3* 8.9 - 10.3 mg/dL Final  . Total Protein 11/02/2017 5.9* 6.5 - 8.1 g/dL Final  . Albumin 11/02/2017 2.4* 3.5 - 5.0 g/dL Final  . AST 11/02/2017 189* 15 - 41 U/L Final  . ALT 11/02/2017 154* 0 - 44 U/L Final  . Alkaline Phosphatase 11/02/2017 884* 38 - 126 U/L Final  . Total Bilirubin 11/02/2017 11.5* 0.3 - 1.2 mg/dL Final  . GFR calc non Af Amer 11/02/2017 53* >60 mL/min Final  . GFR calc Af Amer 11/02/2017 >60  >60 mL/min Final   Comment: (NOTE) The eGFR has been calculated using the CKD EPI equation. This calculation has not been validated in all clinical situations. eGFR's  persistently <60 mL/min signify possible Chronic Kidney Disease.   Georgiann Hahn gap 11/02/2017 13  5 - 15 Final   Performed at [facility], [address], [place], [place] [address]  . Magnesium 11/02/2017 2.6* 1.7 - 2.4 mg/dL Final   Performed at [facility], [address] [place], [place] [address]  . WBC 11/02/2017 17.6* 4.0 - 10.5 K/uL Final  . RBC 11/02/2017 3.82* 4.22 - 5.81 MIL/uL Final  . Hemoglobin 11/02/2017 12.3* 13.0 - 17.0 g/dL Final  . HCT 11/02/2017 35.8* 39.0 - 52.0 % Final  . MCV 11/02/2017 93.7  80.0 - 100.0 fL Final  . MCH 11/02/2017 32.2  26.0 - 34.0 pg Final  . MCHC 11/02/2017 34.4  30.0 - 36.0 g/dL Final  . RDW 11/02/2017 16.2* 11.5 - 15.5 % Final  . Platelets 11/02/2017 152  150 - 400 K/uL Final  . nRBC 11/02/2017 0.0  0.0 - 0.2 % Final  . Neutrophils Relative % 11/02/2017 92  % Final  . Neutro Abs 11/02/2017 16.3* 1.7 - 7.7 K/uL Final  . Lymphocytes Relative 11/02/2017 2  % Final  . Lymphs Abs 11/02/2017 0.4* 0.7 - 4.0 K/uL Final  . Monocytes Relative 11/02/2017 3  % Final  . Monocytes Absolute 11/02/2017 0.4  0.1 - 1.0 K/uL Final  . Eosinophils Relative 11/02/2017 0  % Final  . Eosinophils Absolute 11/02/2017 0.0  0.0 - 0.5 K/uL Final  . Basophils Relative 11/02/2017 0  % Final  . Basophils Absolute 11/02/2017 0.1  0.0 - 0.1 K/uL Final  . Immature Granulocytes 11/02/2017 3  % Final  . Abs Immature Granulocytes 11/02/2017 0.45* 0.00 - 0.07 K/uL Final   Performed at [facility], [address], [place], [place] [address]  . Total Protein 11/02/2017 5.7* 6.5 - 8.1 g/dL Final  . Albumin 11/02/2017 2.4* 3.5 - 5.0 g/dL Final  . AST 11/02/2017 174* 15 - 41 U/L Final  . ALT 11/02/2017 147* 0 - 44 U/L Final  . Alkaline Phosphatase 11/02/2017 873* 38 - 126 U/L Final  . Total Bilirubin 11/02/2017 11.0* 0.3 - 1.2 mg/dL Final  . Bilirubin, Direct 11/02/2017 7.3* 0.0 - 0.2 mg/dL Final  . Indirect Bilirubin 11/02/2017 3.7* 0.3 - 0.9 mg/dL Final   Performed at [facility], [address], [place],  [address]  . Sodium 11/03/2017 129* 135 - 145 mmol/L Final  .  Potassium 11/03/2017 4.2  3.5 - 5.1 mmol/L Final  . Chloride 11/03/2017 100  98 - 111 mmol/L Final  . CO2 11/03/2017 20* 22 - 32 mmol/L Final  . Glucose, Bld 11/03/2017 129* 70 - 99 mg/dL Final  . BUN 11/03/2017 53* 8 - 23 mg/dL Final  . Creatinine, Ser 11/03/2017 1.19  0.61 - 1.24 mg/dL Final  . Calcium 11/03/2017 8.0* 8.9 - 10.3 mg/dL Final  . Total Protein 11/03/2017 5.4* 6.5 - 8.1 g/dL Final  . Albumin 11/03/2017 2.3* 3.5 - 5.0 g/dL Final  . AST 11/03/2017 158* 15 - 41 U/L Final  . ALT 11/03/2017 139* 0 - 44 U/L Final  . Alkaline Phosphatase 11/03/2017 829* 38 - 126 U/L Final  . Total Bilirubin 11/03/2017 10.8* 0.3 - 1.2 mg/dL Final  . GFR calc non Af Amer 11/03/2017 54* >60 mL/min Final  . GFR calc Af Amer 11/03/2017 >60  >60 mL/min Final   Comment: (NOTE) The eGFR has been calculated using the CKD EPI equation. This calculation has not been validated in all clinical situations. eGFR's persistently <60 mL/min signify possible Chronic Kidney Disease.   Georgiann Hahn gap 11/03/2017 9  5 - 15 Final   Performed at [facility], [address] [place], [place] [address]  . Magnesium 11/03/2017 2.7* 1.7 - 2.4 mg/dL Final   Performed at [facility], [address] [place], [place] [address]  . WBC 11/03/2017 23.4* 4.0 - 10.5 K/uL Final  . RBC 11/03/2017 3.75* 4.22 - 5.81 MIL/uL Final  . Hemoglobin 11/03/2017 11.8* 13.0 - 17.0 g/dL Final  . HCT 11/03/2017 34.6* 39.0 - 52.0 % Final  . MCV 11/03/2017 92.3  80.0 - 100.0 fL Final  . MCH 11/03/2017 31.5  26.0 - 34.0 pg Final  . MCHC 11/03/2017 34.1  30.0 - 36.0 g/dL Final  . RDW 11/03/2017 16.3* 11.5 - 15.5 % Final  . Platelets 11/03/2017 162  150 - 400 K/uL Final  . nRBC 11/03/2017 0.0  0.0 - 0.2 % Final  . Neutrophils Relative % 11/03/2017 90  % Final  . Neutro Abs 11/03/2017 21.0* 1.7 - 7.7 K/uL Final  . Lymphocytes Relative 11/03/2017 2  %  Final  . Lymphs Abs 11/03/2017 0.4* 0.7 - 4.0 K/uL Final  . Monocytes Relative 11/03/2017 5  % Final  . Monocytes Absolute 11/03/2017 1.2* 0.1 - 1.0 K/uL Final  . Eosinophils Relative 11/03/2017 0  % Final  . Eosinophils Absolute 11/03/2017 0.0  0.0 - 0.5 K/uL Final  . Basophils Relative 11/03/2017 0  % Final  . Basophils Absolute 11/03/2017 0.0  0.0 - 0.1 K/uL Final  . Immature Granulocytes 11/03/2017 3  % Final  . Abs Immature Granulocytes 11/03/2017 0.73* 0.00 - 0.07 K/uL Final   Performed at [facility], [address] [place], [place] [address]  . Prothrombin Time 11/03/2017 20.2* 11.4 - 15.2 seconds Final  . INR 11/03/2017 1.74   Final   Performed at [facility], [address], [place], [place] [address]  Appointment on 11/01/2017  Component Date Value Ref Range Status  . WBC 11/01/2017 20.3* 4.0 - 10.5 K/uL Final  . RBC 11/01/2017 4.04* 4.22 - 5.81 MIL/uL Final  . Hemoglobin 11/01/2017 12.9* 13.0 - 17.0 g/dL Final  . HCT 11/01/2017 37.8* 39.0 - 52.0 % Final  . MCV 11/01/2017 93.6  80.0 - 100.0 fL Final  . MCH 11/01/2017 31.9  26.0 - 34.0 pg Final  . MCHC 11/01/2017 34.1  30.0 - 36.0 g/dL Final  .  RDW 11/01/2017 15.7* 11.5 - 15.5 % Final  . Platelets 11/01/2017 171  150 - 400 K/uL Final  . nRBC 11/01/2017 0.0  0.0 - 0.2 % Final  . Neutrophils Relative % 11/01/2017 85  % Final  . Neutro Abs 11/01/2017 17.4* 1.7 - 7.7 K/uL Final  . Lymphocytes Relative 11/01/2017 4  % Final  . Lymphs Abs 11/01/2017 0.8  0.7 - 4.0 K/uL Final  . Monocytes Relative 11/01/2017 8  % Final  . Monocytes Absolute 11/01/2017 1.5* 0.1 - 1.0 K/uL Final  . Eosinophils Relative 11/01/2017 0  % Final  . Eosinophils Absolute 11/01/2017 0.1  0.0 - 0.5 K/uL Final  . Basophils Relative 11/01/2017 0  % Final  . Basophils Absolute 11/01/2017 0.1  0.0 - 0.1 K/uL Final  . Immature Granulocytes 11/01/2017 3  % Final  . Abs Immature Granulocytes 11/01/2017 0.54* 0.00 - 0.07 K/uL Final   Performed at [facility], [address], [place], [place] [address]  . Sodium 11/01/2017 126* 135 - 145 mmol/L Final  . Potassium 11/01/2017 4.3  3.5 - 5.1 mmol/L Final  . Chloride 11/01/2017 94* 98 - 111 mmol/L Final  . CO2 11/01/2017 18* 22 - 32 mmol/L Final  . Glucose, Bld 11/01/2017 130* 70 - 99 mg/dL Final  . BUN 11/01/2017 42* 8 - 23 mg/dL Final  . Creatinine, Ser 11/01/2017 1.34* 0.61 - 1.24 mg/dL Final  . Calcium 11/01/2017 8.6* 8.9 - 10.3 mg/dL Final  . Total Protein 11/01/2017 6.3* 6.5 - 8.1 g/dL Final  . Albumin 11/01/2017 2.7* 3.5 - 5.0 g/dL Final  . AST 11/01/2017 216* 15 - 41 U/L Final  . ALT 11/01/2017 167* 0 - 44 U/L Final  . Alkaline Phosphatase 11/01/2017 929* 38 - 126 U/L Final  . Total Bilirubin 11/01/2017 10.6* 0.3 - 1.2 mg/dL Final  . GFR calc non Af Amer 11/01/2017 47* >60 mL/min Final  . GFR calc Af Amer 11/01/2017 54* >60 mL/min Final   Comment: (NOTE) The eGFR has been calculated using the CKD EPI equation. This calculation has not been validated in all clinical situations. eGFR's persistently <60 mL/min signify possible Chronic Kidney Disease.   Georgiann Hahn gap 11/01/2017 14  5 - 15 Final   Performed at [facility], [address], [place], [place] [address]      Assessment and Plan:  Malignant melanoma ([place]) 1. 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 -Barium swallow done at [facility] showed a large mass in the distal esophagus. -PET/CT scan on 3/00/7622 hypermetabolic solid 3.5 x 2.3 cm esophageal mass in the mid to lower third of the thoracic esophagus with max SUV 7.3 and nonspecific mildly hypermetabolic nonenlarged bilateral hilar lymph nodes, indeterminate.  - EGD on 05/26/2017 shows large fungating mass with no bleeding, 30 cm from incisors, extends to 35 cm, partially obstructing, biopsy consistent with squamous cell carcinoma. - Started on combination chemoradiation therapy with weekly carboplatin  and paclitaxel on 06/27/2017. He has developed worsening of difficulty swallowing. He had a PEG tube placed on 07/05/2017 along with the Port-A-Cath on the same day. He is taking and 1 can of Osmolite 1.5/day. He is able to drink 3 ensures per day. He is also eating soft foods. - He finished last cycle of chemotherapy on 08/02/2017 and radiation on 08/05/2017. - PET/CT scan on 09/16/2017 shows decrease in size and metabolic activity of the primary lesion in the distal esophagus.   2.  Metastatic malignant melanoma:Pt had PET scan done 09/16/2017 that showed MPRESSION: 1. Unfortunately, multiple new hypermetabolic hepatic metastasis throughout the liver. 2. New hypermetabolic metastatic adenopathy in the upper abdomen. 3. Persistent hypermetabolic hilar lymph nodes (reactive adenopathy versus metastatic adenopathy) 4. New hypermetabolic RIGHT lower lobe pulmonary nodule with differential including small focus infection versus pulmonary metastasis. 5. Decrease in size and metabolic activity of the primary lesion in distal esophagus.  - Biopsy of the liver lesionswas done 09/28/2017 with pathology returning asmalignant melanoma. He never had a known history of melanoma resected. -Pt was seen by Dr. Worthy Keeler whodiscussed the prognosis of metastatic malignant melanoma and treatment with palliative intent. - Foundation 1 testing showsMS-stable, TMB-low,NRAS Q61L, MYC amplification, PDL 1 TPS of 0% -He discussedcombination immunotherapy with nivolumab and ipilimumab given every 3 weeks for 4 cycles followed by monthly nivolumab. Response rates with a combination regimen is around 60% and 3-year survival of 70%. Healso talked about single agent nivolumab which has slightly low response rates of around 40% and 4-year survival about 50%.Side effects of therapy were discussed. -Brain MRI was negative for metastatic disease.  Pt was treatedby Dr. Laqueta Jean and Ipi on  10/18/2017.  3. Abnormal LFTS. Pt was admitted from clinic due to jaundice and elevated LFTS.    MRCP was done on 11/01/2017 that was reviewed and showed  IMPRESSION: 1. Marked progression of diffuse hepatic metastatic disease compared with PET-CT of 09/16/2017. The liver is markedly enlarged and nearly completely replaced by tumor. 2. Associated metastatic lymphadenopathy in the porta hepatis and upper retroperitoneum. 3. No evidence of biliary dilatation. The gallbladder is contracted and not well visualized. Jaundice is likely secondary to intrahepatic cholestasis. 4. Small bilateral pleural effusions and mild ascites.dark urine and flank pain as well as nausea.   Pt has been seen by GI with no additional recommendations.    Labs done 11/02/2017 reviewed and showed WBC 17.6 HB 12.3 plts 152,000.  Chemistries show bilirubin of 11 with direct bilirubin of 7.3.    No biliary dilatation or obstruction was noted on MRCP.  Suspect pt may have immune mediated hepatitis along with extensive liver disease from metastatic cancer.  He is on steroids.  Labs done 11/03/2017 reviewed and showed white count 23.4 hemoglobin 11.8 platelets 162,000.  Chemistries show a potassium of 4.2 creatinine 1.19 magnesium 2.7 alkaline phosphatase is 829 AST is 158 ALT 139 bilirubin 10.8.    Bilirubin remains elevated.  I have discussed with Dr. Wynetta Emery switching the patient to prednisone 80 mg p.o. daily.  Will evaluate tolerance of oral steroids.  Patient has been seen by palliative medicine and family likely is leaning more towards hospice referral.  This is reasonable as it is unclear if he will have any recovery of liver function despite steroids due to the volume of metastatic disease on recent imaging.  Palliative team will discuss this with the patient and family.   4.Colorectal cancer: - Underwent segmental colon resection in November 2016, found to have a T3N0 adenocarcinoma, with circumferential  margins positive. Patient did not receive any neoadjuvant or adjuvant chemotherapy/radiation therapy.  5Prostate cancer: -Originally diagnosed in early 2000s, status post seed implants. Underwent cystoprostatectomy in November 2016, pathology showed prostatic adenocarcinoma Gleason 8 (4+4).  6. Pain: Palliative Care consulted.    7. Nausea. Continue IVF and antiemetics.  Pt feels this is improved.  8.  DVT.  Patient has a right lower extremity DVT based on ultrasound done 11/02/2017.  He is on anticoagulation per hospitalist  team.  This is likely related to advanced malignancy.

## 2017-11-03 NOTE — Clinical Social Work Note (Signed)
Patient Information   Patient Name Ian Christensen, Ian Christensen ([id number]) Sex Male DOB 10/07/1931 SSN [id number]  Room Bed  A312 A312-01  Patient Demographics   Address [address]  [place] [address] Phone [phone] (Home) [phone] (Mobile) *Preferred* E-mail Address [email or web address]  Patient Ethnicity & Race   Ethnic Group Patient Race  Not Hispanic or Latino White or Caucasian  Emergency Contact(s)   Name Relation Home Work [place] Daughter [phone]  [phone]  McLean,Tammie Daughter [phone]    Documents on File    Status Date Received Description  Documents for the Patient  EMR Patient Summary Not Received    [place] HIPAA NOTICE OF PRIVACY - Scanned Received 05/29/10   [place] E-Signature HIPAA Notice of Privacy Received 12/22/11   [place] E-Signature HIPAA Notice of Privacy Spanish Not Received    Driver's License Received 12/05/14 2018  Insurance Card Received 05/29/10   Advance Directives/Living Will/HCPOA/POA Not Received    Financial Application Not Received    Insurance Card Received 12/04/10   Insurance Card Received 11/17/11 RCGD  Release of Information Received 11/17/11 DPR - RCGD  [place] HIPAA NOTICE OF PRIVACY - Scanned Received 11/17/11 RCGD  HIM ROI Authorization Not Received  RCGD--11/17/11 note to PCP  AMB Correspondence Not Received  10/13 office note [facility]   Other Photo ID Not Received    Insurance Card Received 09/27/14 AETNA  HIM ROI Authorization  10/15/14 [facility]  AMB Correspondence  09/27/14 OFFICE NOTE [facility]  AMB Correspondence  10/09/14 OFFICE NOTES [facility]  AMB Correspondence  10/09/14 OFFICENOTES [facility]  Advance Directives/Living Will/HCPOA/POA  11/07/14   AMB Correspondence  01/07/15 OFFICE NOTE/ [facility]  AMB Correspondence  02/05/15 1/15-9/16 OFFICE NOTE  AMB  Correspondence  08/03/13 1/15-9/16 OFFICE NOTE/ [facility]  AMB Correspondence  02/10/15 OFFICE NOTE/ [facility]  AMB Correspondence  01/09/15 FOLLOW UP/ [facility]  AMB Correspondence  10/22/14 OFFICE NOTE/ [facility] [place] [facility]  Insurance Card Received 06/12/15   AMB Correspondence  06/23/15 HISTORY&PHYSICAL/[facility]  AMB Correspondence  07/10/15 OFFICE NOTE  AMB Correspondence  10/01/15 RECALL ASSESSMENT [facility]  Release of Information Received 12/25/15 HEALTH TEAM AD- PRE AUTH  AMB Correspondence  10/10/15 OFFICE VISIT/ [facility]  AMB Correspondence  02/04/16 [facility]  Insurance Card Received 05/31/16   Insurance Card Received 10/04/16   Insurance Card Received 02/10/17 hta 2019  Driver's License Received 02/24/17 exp 7.10.2023  AMB Intake Forms/Questionnaires Received 03/14/17 02/19 [employer] REFERRAL [employer] [facility]  Insurance Card     Insurance Card Received 06/09/17 [employer]--[facility]  Advanced Beneficiary Notice (ABN) Not Received    E-Signature AOB Spanish Not Received    Release of Information Received 06/28/17 DPR/CHMG/RSA/2019  AMB Correspondence Received 09/30/17 OFFICE VISIT [facility]  AMB Correspondence Received 10/20/17 OFFICE NOTES FROM DR Jonny Ruiz [facility]  AMB Correspondence Not Received (Deleted)  10/13 Referral [facility]  AMB Correspondence (Deleted) 08/03/13 OFFICE NOTE/ [facility]  AMB Correspondence Received (Deleted) 10/20/17 OFFICE NOTES FROM DR Nita Sells  Documents for the Encounter  AOB (Assignment of Insurance Benefits) Not Received    E-signature AOB Unable to Obtain 11/01/17   MEDICARE RIGHTS Not Received    E-signature Medicare Rights Unable to Obtain 11/01/17   Medicare Observation  11/02/17   Ultrasound Received 11/02/17   Admission Information   Attending Provider Admitting Provider Admission Type Admission Date/Time  Cleora Fleet, MD Cleora Fleet, MD Urgent 11/01/17 1159  Discharge Date Hospital Service Auth/Cert Status Service Area   Internal Medicine Incomplete [facility]  Unit Room/Bed Admission Status   AP-DEPT 300 A312/A312-01 Admission (Confirmed)   Admission   Complaint  Nausea Vomiting Jaundice  Hospital Account   Name Acct ID Class Status Primary Coverage  Ian Christensen, Ian Christensen [id number] Inpatient Open [employer]ALTH[employer] ADVANTAGE - HEALTHTEAM ADVANTAGE      Guarantor Account (for Hospital Account [id number])   Name Relation to Pt Service Area Active? Acct Type  Ian Christensen Self CHSA Yes Personal/Family  Address Phone    [address]  [place], [place] [address] [phone](H)        Coverage Information (for Hospital Account [id number])   F/O Payor/Plan Precert #  [employer] ADVANTAGE/HEALTHTEAM ADVANTAGE   Subscriber Subscriber #  Ian Christensen, Ian Christensen [id number]  Address Phone  [facility] CLAIMS DEPARTMENT [address] [address] [place], [place] [address] [phone]

## 2017-11-04 ENCOUNTER — Inpatient Hospital Stay
Admission: RE | Admit: 2017-11-04 | Discharge: 2017-11-18 | Disposition: E | Payer: PPO | Source: Ambulatory Visit | Attending: Internal Medicine | Admitting: Internal Medicine

## 2017-11-04 LAB — CBC WITH DIFFERENTIAL/PLATELET
ABS IMMATURE GRANULOCYTES: 0.52 10*3/uL — AB (ref 0.00–0.07)
BASOS PCT: 0 %
Basophils Absolute: 0 10*3/uL (ref 0.0–0.1)
Eosinophils Absolute: 0 10*3/uL (ref 0.0–0.5)
Eosinophils Relative: 0 %
HEMATOCRIT: 35 % — AB (ref 39.0–52.0)
HEMOGLOBIN: 12.2 g/dL — AB (ref 13.0–17.0)
Immature Granulocytes: 3 %
LYMPHS PCT: 2 %
Lymphs Abs: 0.4 10*3/uL — ABNORMAL LOW (ref 0.7–4.0)
MCH: 32.8 pg (ref 26.0–34.0)
MCHC: 34.9 g/dL (ref 30.0–36.0)
MCV: 94.1 fL (ref 80.0–100.0)
MONO ABS: 1.1 10*3/uL — AB (ref 0.1–1.0)
MONOS PCT: 6 %
NEUTROS ABS: 16.6 10*3/uL — AB (ref 1.7–7.7)
Neutrophils Relative %: 89 %
Platelets: 159 10*3/uL (ref 150–400)
RBC: 3.72 MIL/uL — ABNORMAL LOW (ref 4.22–5.81)
RDW: 16.5 % — ABNORMAL HIGH (ref 11.5–15.5)
WBC: 18.6 10*3/uL — ABNORMAL HIGH (ref 4.0–10.5)
nRBC: 0.1 % (ref 0.0–0.2)

## 2017-11-04 LAB — URINALYSIS, ROUTINE W REFLEX MICROSCOPIC
GLUCOSE, UA: NEGATIVE mg/dL
KETONES UR: NEGATIVE mg/dL
Nitrite: NEGATIVE
PH: 9 — AB (ref 5.0–8.0)
Protein, ur: 100 mg/dL — AB
Specific Gravity, Urine: 1.018 (ref 1.005–1.030)

## 2017-11-04 LAB — COMPREHENSIVE METABOLIC PANEL
ALBUMIN: 2.2 g/dL — AB (ref 3.5–5.0)
ALT: 139 U/L — ABNORMAL HIGH (ref 0–44)
AST: 153 U/L — ABNORMAL HIGH (ref 15–41)
Alkaline Phosphatase: 769 U/L — ABNORMAL HIGH (ref 38–126)
Anion gap: 11 (ref 5–15)
BUN: 49 mg/dL — AB (ref 8–23)
CHLORIDE: 103 mmol/L (ref 98–111)
CO2: 17 mmol/L — ABNORMAL LOW (ref 22–32)
Calcium: 7.9 mg/dL — ABNORMAL LOW (ref 8.9–10.3)
Creatinine, Ser: 1.06 mg/dL (ref 0.61–1.24)
GFR calc Af Amer: >60 mL/min (ref >60)
Glucose, Bld: 121 mg/dL — ABNORMAL HIGH (ref 70–99)
Potassium: 4.4 mmol/L (ref 3.5–5.1)
SODIUM: 131 mmol/L — AB (ref 135–145)
Total Bilirubin: 11.6 mg/dL — ABNORMAL HIGH (ref 0.3–1.2)
Total Protein: 5 g/dL — ABNORMAL LOW (ref 6.5–8.1)

## 2017-11-04 LAB — MAGNESIUM: MAGNESIUM: 2.8 mg/dL — AB (ref 1.7–2.4)

## 2017-11-04 MED ORDER — CHLORPROMAZINE HCL 25 MG PO TABS: 25.0000 mg | ORAL_TABLET | Freq: Three times a day (TID) | ORAL | 0 refills | Status: AC | PRN

## 2017-11-04 MED ORDER — AMLODIPINE BESYLATE 5 MG PO TABS: 5.0000 mg | ORAL_TABLET | Freq: Every day | ORAL | 0 refills | Status: DC

## 2017-11-04 MED ORDER — TRAMADOL HCL 50 MG PO TABS: 50.0000 mg | ORAL_TABLET | Freq: Four times a day (QID) | ORAL | 0 refills | Status: DC | PRN

## 2017-11-04 MED ORDER — PREDNISONE 20 MG PO TABS: 40.0000 mg | ORAL_TABLET | Freq: Every day | ORAL | Status: AC

## 2017-11-04 MED ORDER — APIXABAN 5 MG PO TABS: ORAL_TABLET | ORAL | 0 refills | Status: AC

## 2017-11-04 MED ORDER — ZOLPIDEM TARTRATE 5 MG PO TABS: 5.0000 mg | ORAL_TABLET | Freq: Every evening | ORAL | 0 refills | Status: DC | PRN

## 2017-11-04 NOTE — Progress Notes (Signed)
Patient noted to have foul smelling urine, cloudy with sediment in urostomy bag. Notified Dr. Darrick Meigs, new orders for UA and culture. Obtained.

## 2017-11-04 NOTE — Clinical Social Work Note (Signed)
Per Palliative care request, referral sent to [facility] for private pay consideration.

## 2017-11-04 NOTE — Progress Notes (Signed)
Palliative: Ian Christensen is resting quietly in bed.  He is sleeping soundly, and I do not try to wake him.  Present today at bedside his daughter, Bernette Mayers and Mr. Gonyea's brother.  Jackelyn Poling shares that she has spoken with hospice representative who shares that she should call him when she has decided on course of action.  W shares that she would like her father's information sent to [facility] for private pay.  We discussed social work consult to contact her for specifics, but we discussed generalities.  Mr. Lewelling has a private long-term care policy which will pay $539 per day per daughter.  Family is willing to absorb the rest of the cost, understand that they are likely to have to pay upfront. Debbie and daughter Lynelle Smoke understand Mr. Jarnagin's prognosis, and are accepting hospice care with [facility] if possible, and if not they are requesting that [facility] hospice consider Mr. Lovena Le for inpatient care.  At this point, he may have 2 weeks or less if he continues to sleep most of the day, continues with poor by mouth intake. 11 minutes Quinn Axe, NP Palliative Medicine Team Team Phone # [phone]  Greater than 50% of this time was spent counseling and coordinating care related to the above assessment and plan.

## 2017-11-04 NOTE — Discharge Summary (Signed)
Physician Discharge Summary  Ian Christensen ZOX:[id number] DOB: [birth year] DOA: 11/01/2017  PCP: Elfredia Nevins, MD  Admit date: 11/01/2017 Discharge date: 11/12/2017  Admitted From: home Disposition:  SNF with hospice  Recommendations for Outpatient Follow-up:  1. Patient will be discharged to [facility] for end of life care. Family wishes that hospice services follow patient at SNF  Discharge Condition:hospice CODE STATUS:DNR Diet recommendation: regular diet for comfort  Brief/Interim Summary: 84 year old male with a history of squamous cell carcinoma distal esophagus, malignant melanoma who presented to the hospital with abnormal labs.  He was directly admitted to the hospital from the oncology clinic.  Found to have a bilirubin of 10.6 and elevated LFTs.  He also had acute kidney injury.  Patient underwent MRCP and was found to have diffuse hepatic metastatic disease which is likely related to elevated liver function test.  He was started on steroids per oncology.  This did not have any significant impact on his LFTs.  He was hydrated with IV fluids.  Hospital course further complicated by diagnosis of right lower extremity DVT on venous Dopplers.  He was started on apixaban.  Oncology and palliative care as well as gastroenterology saw the patient in the hospital.  It was felt that his overall prognosis is poor.  No further cancer treatments are recommended at this time.  GI did not have any further recommendations either.  He was seen by palliative care who initially offered hospice services to the patient.  The family has elected to go to [facility] nursing center with hospice services to follow there.  They are understanding of his prognosis and accept that he will likely decline in the near future.  Prognosis is days to weeks..  When he does decline, they do not want him readmitted to the hospital.  They would prefer continued comfort care and hospice services.  Patient has been  offered a bed at [facility] today and will be discharged there for further end-of-life care.  He should have hospice services follow at the nursing facility.  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   Oxycodone    Caused hallucinations      Medication List    STOP taking these medications   aspirin EC 81 MG tablet   lidocaine-prilocaine cream Commonly known as:  EMLA   OPDIVO IV   simvastatin 20 MG tablet Commonly known as:  ZOCOR   vitamin B-12 500 MCG tablet Commonly known as:  CYANOCOBALAMIN   YERVOY IV     TAKE these medications   amLODipine 5 MG tablet Commonly known as:  NORVASC Take 1 tablet (5 mg total) by mouth daily. Start taking on:  11/05/2017   apixaban 5 MG Tabs tablet Commonly known as:  ELIQUIS 10mg  po bid for 4 more  days then 5mg  po bid   chlorproMAZINE 25 MG tablet Commonly known as:  THORAZINE Take 1 tablet (25 mg total) by mouth 3 (three) times daily as needed for hiccoughs.   metoprolol succinate 25 MG 24 hr tablet Commonly known as:  TOPROL-XL Take 25 mg by mouth 2 (two) times daily.   omeprazole 20 MG capsule Commonly known as:  PRILOSEC Take 20 mg by mouth every evening.   predniSONE 20 MG tablet Commonly known as:  DELTASONE Take 2 tablets (40 mg total) by mouth daily at 3 pm.   prochlorperazine 10 MG tablet Commonly known  as:  COMPAZINE Take 1 tablet (10 mg total) by mouth every 6 (six) hours as needed for nausea or vomiting.   traMADol 50 MG tablet Commonly known as:  ULTRAM Take 1 tablet (50 mg total) by mouth every 6 (six) hours as needed for moderate pain. What changed:  See the new instructions.   zolpidem 5 MG tablet Commonly known as:  AMBIEN Take 1 tablet (5 mg total) by mouth at bedtime as needed for sleep.      Contact information for after-discharge care    Destination    [facility] .   Service:  Skilled Nursing Contact information: [address] [place] [address] [phone]             Allergies  Allergen Reactions  . Morphine And Related     Caused hallucinations.  . Oxycodone     Caused hallucinations    Consultations:  Oncology  Palliative care   Procedures/Studies: Mr Laqueta Jean Wo Contrast  Result Date: 10/20/2017 CLINICAL DATA:  New diagnosis of metastatic melanoma. History of prostate cancer, esophageal cancer, hypertension and hyperlipidemia. EXAM: MRI HEAD WITHOUT AND WITH CONTRAST TECHNIQUE: Multiplanar, multiecho pulse sequences of the brain and surrounding structures were obtained without and with intravenous contrast. CONTRAST:  7.5 cc Gadavist COMPARISON:  MRI head November 02, 2005 FINDINGS: INTRACRANIAL CONTENTS: No reduced diffusion to suggest acute ischemia or hypercellular tumor. No susceptibility artifact to suggest hemorrhage. The ventricles and sulci are normal for patient's age. Similar minimal supratentorial white matter FLAIR T2 hyperintensities compatible with chronic small vessel ischemic changes, less than expected for age. No suspicious parenchymal signal, masses, mass effect. No abnormal intraparenchymal or extra-axial enhancement. No abnormal extra-axial fluid collections. No extra-axial masses. VASCULAR: Normal major intracranial vascular flow voids present at skull base. SKULL AND UPPER CERVICAL  SPINE: No abnormal sellar expansion. No suspicious calvarial bone marrow signal. Mild cystic degenerative changes C1-2. Craniocervical junction maintained. SINUSES/ORBITS: The mastoid air-cells and included paranasal sinuses are well-aerated.The included ocular globes and orbital contents are non-suspicious. OTHER: None. IMPRESSION: No metastatic disease; negative MRI head with and without contrast for age. Electronically Signed   By: Awilda Metro M.D.   On: 10/20/2017 13:49   Ct Abdomen Pelvis W Contrast  Result Date: 10/24/2017 CLINICAL DATA:  NEW DX MELANOMA, HX PROSTATE CA 2001 WITH SEED, RECTAL CA 2016 WITH SURG ONLY PROSTECTOMY DONE AT THAT TIME, HX HIATAL HERNIA, ESOPHAGEAL CA. PT C/O RLQ ABDOMEN PAIN TODAY. EXAM: CT ABDOMEN AND PELVIS WITH CONTRAST TECHNIQUE: Multidetector CT imaging of the abdomen and pelvis was performed using the standard protocol following bolus administration of intravenous contrast. CONTRAST:  ISOVUE-300 IOPAMIDOL (ISOVUE-300) INJECTION 61% COMPARISON:  PET-CT, 09/16/2017. Chest, abdomen and pelvis CT, 06/21/2016 and 06/20/2015. FINDINGS: Lower chest: Small calcified granuloma at the right lung base. 3 mm noncalcified nodule left lower lobe, image  14, series 5, stable from the CT dated 06/20/2015. No acute findings. Hepatobiliary: Liver mildly enlarged demonstrating numerous metastatic lesions, which have progressed since the prior PET-CT. A mass in segment 4A measures 3 cm, 1.6 cm on the prior PET-CT. Mass at the dome of segment 7 measures 3.2 cm, previously 1.1 cm. There are multiple new and enlarged metastatic lesions. Small dependent gallstones, stable. No evidence of acute cholecystitis. No bile duct dilation. Pancreas: Unremarkable. No pancreatic ductal dilatation or surrounding inflammatory changes. Spleen: Normal in size without focal abnormality. Adrenals/Urinary Tract: No adrenal masses. Kidneys normal in size, orientation and position with symmetric  enhancement and excretion. Stable bilateral low-density renal masses are noted consistent with cysts. No stones. No hydronephrosis. Ureters extend inferiorly into a ileal conduit that lies in the anterior pelvis. There are stones within the ileo conduit, stable from the prior exam. No hydronephrosis. Stomach/Bowel: Well-positioned gastrostomy tube along the anterior stomach. No stomach wall thickening or inflammation. Small bowel is normal in caliber. No wall thickening. No inflammation. In the left mid to lower abdomen, a loop of small bowel enters the colostomy defect. There is no evidence of strangulation or incarceration. Colon is normal in caliber with no wall thickening or inflammation. Left colon extends into a left lower quadrant colostomy. Vascular/Lymphatic: There are enlarged upper abdominal lymph nodes. Largest gastrohepatic lymph node measures 2.7 cm in short axis, previously 1.7 cm. There are enlarged lymph node lies posterior to the pancreatic body and proximal tail measuring 2 cm in short axis, previously 1 cm. A peri celiac node measures 3.4 cm, previously 1.3 cm. A preaortic node just below the left renal vein measures 2.1 cm, previously 8 mm. Aortic atherosclerosis.  No venous thrombosis.  No aneurysm. Reproductive: S/p prostatectomy. Other: Trace amount of ascites adjacent to the liver and pelvis. Musculoskeletal: No fracture. No osteoblastic or osteolytic lesions. IMPRESSION: 1. There has been significant interval progression of metastatic disease, with numerous new and enlarged liver masses, which now enlarged the liver. This progression has occurred since the PET-CT dated 09/16/2017. There has also been progression upper abdominal adenopathy along the peripancreatic, peri celiac and gastrohepatic ligament change as well as the upper retroperitoneum. Trace amount of ascites is new from the prior study. 2. No acute findings. No evidence of bowel obstruction or inflammation. 3. Small bowel  herniates into the left lower quadrant colostomy site without strangulation, incarceration or obstruction. This is more prominent than it was on the prior PET-CT. Other postsurgical changes are stable. Electronically Signed   By: Amie Portland M.D.   On: 10/24/2017 12:57   Mr 3d Recon At Scanner  Result Date: 11/01/2017 CLINICAL DATA:  Jaundice with left flank pain, generalized malaise and increasing weakness. History of squamous cell esophageal cancer, rectal cancer and remote prostate cancer. Undergoing immunotherapy. EXAM: MRI ABDOMEN WITHOUT AND WITH CONTRAST (INCLUDING MRCP) TECHNIQUE: Multiplanar multisequence MR imaging of the abdomen was performed both before and after the administration of intravenous contrast. Heavily T2-weighted images of the biliary and pancreatic ducts were obtained, and three-dimensional MRCP images were rendered by post processing. CONTRAST:  7.5 cc Gadavist COMPARISON:  Abdominopelvic CT 03/05/2017.  PET-CT 09/16/2017. FINDINGS: Despite efforts by the technologist and patient, mild motion artifact is present on today's exam and could not be eliminated. This reduces exam sensitivity and specificity. Lower chest: There are small bilateral pleural effusions with mild dependent atelectasis at both lung bases. Hepatobiliary: Interval marked enlargement of the liver which measures approximately 25.2 x 16.5 x 29.4  cm. There are innumerable hepatic lesions which have markedly progressed compared with the PET-CT of 6 weeks ago. Most of the lesions demonstrate low T1 signal, although there are a few lesions with intrinsic T1 shortening. There is heterogeneous T2 signal with areas of hyperintensity. Following contrast, some of the lesions demonstrate heterogeneous peripheral enhancement. Many of the lesions appear necrotic without central enhancement. The vast majority of the hepatic parenchyma is replaced by tumor. The gallbladder appears contracted and is not well visualized. There is no  extrahepatic biliary dilatation. Pancreas: Unremarkable. No pancreatic ductal dilatation or surrounding inflammatory changes. Spleen: Normal in size without focal abnormality. Adrenals/Urinary Tract: Both adrenal glands appear normal. There is a simple cyst measuring 2.8 cm in the upper pole the left kidney. No evidence of enhancing renal mass or hydronephrosis. Stomach/Bowel: No evidence of bowel wall thickening, distention or surrounding inflammatory change. Vascular/Lymphatic: There are multiple enlarged lymph nodes within the porta hepatis and upper retroperitoneum, most obvious on the diffusion-weighted images. These were hypermetabolic on previous PET-CT. No acute vascular findings identified. Other: Small amount of perihepatic ascites. Musculoskeletal: Although the marrow appears heterogeneous on diffusion weighted imaging, no focal lesions or abnormal enhancement identified within the spine on T2 weighted or post-contrast imaging. A simple lipoma is noted posterior to the left scapular tip. IMPRESSION: 1. Marked progression of diffuse hepatic metastatic disease compared with PET-CT of 09/16/2017. The liver is markedly enlarged and nearly completely replaced by tumor. 2. Associated metastatic lymphadenopathy in the porta hepatis and upper retroperitoneum. 3. No evidence of biliary dilatation. The gallbladder is contracted and not well visualized. Jaundice is likely secondary to intrahepatic cholestasis. 4. Small bilateral pleural effusions and mild ascites. Electronically Signed   By: Carey Bullocks M.D.   On: 11/01/2017 14:42   US Venous Img Lower Unilateral Right  Result Date: 11/02/2017 CLINICAL DATA:  Right lower extremity edema. EXAM: RIGHT LOWER EXTREMITY VENOUS DOPPLER ULTRASOUND TECHNIQUE: Gray-scale sonography with graded compression, as well as color Doppler and duplex ultrasound were performed to evaluate the lower extremity deep venous systems from the level of the common femoral vein and  including the common femoral, femoral, profunda femoral, popliteal and calf veins including the posterior tibial, peroneal and gastrocnemius veins when visible. The superficial great saphenous vein was also interrogated. Spectral Doppler was utilized to evaluate flow at rest and with distal augmentation maneuvers in the common femoral, femoral and popliteal veins. COMPARISON:  None. FINDINGS: Contralateral Common Femoral Vein: Respiratory phasicity is normal and symmetric with the symptomatic side. No evidence of thrombus. Normal compressibility. Common Femoral Vein: Occlusive thrombus present up to the saphenofemoral junction. The saphenofemoral junction itself is open. Saphenofemoral Junction: No evidence of thrombus. Profunda Femoral Vein: No evidence of thrombus. Normal compressibility and flow on color Doppler imaging. Femoral Vein: Occlusive thrombus present throughout the femoral vein in the thigh. Popliteal Vein: Occlusive thrombus present. Calf Veins: Thrombus extends into posterior tibial and peroneal veins in the calf. Superficial Great Saphenous Vein: No evidence of thrombus. Normal compressibility. Venous Reflux:  None. Other Findings: No evidence of superficial thrombophlebitis or abnormal fluid collection. IMPRESSION: Positive for right lower extremity deep venous thrombosis affecting the common femoral vein, femoral vein, popliteal vein and visualized proximal calf veins. Thrombus appears acute/subacute by ultrasound. Electronically Signed   By: Irish Lack M.D.   On: 11/02/2017 10:04   Dg Chest Port 1 View  Result Date: 11/01/2017 CLINICAL DATA:  Acute hepatitis. EXAM: PORTABLE CHEST 1 VIEW COMPARISON:  Chest x-ray dated July 05, 2017. FINDINGS: Unchanged left chest wall port catheter with tip in the mid SVC. The heart size and mediastinal contours are within normal limits. Normal pulmonary vascularity. No focal consolidation, pleural effusion, or pneumothorax. No acute osseous abnormality.  IMPRESSION: No active disease. Electronically Signed   By: Obie Dredge M.D.   On: 11/01/2017 13:40   Mr Abdomen Mrcp Vivien Rossetti Contast  Result Date: 11/01/2017 CLINICAL DATA:  Jaundice with left flank pain, generalized malaise and increasing weakness. History of squamous cell esophageal cancer, rectal cancer and remote prostate cancer. Undergoing immunotherapy. EXAM: MRI ABDOMEN WITHOUT AND WITH CONTRAST (INCLUDING MRCP) TECHNIQUE: Multiplanar multisequence MR imaging of the abdomen was performed both before and after the administration of intravenous contrast. Heavily T2-weighted images of the biliary and pancreatic ducts were obtained, and three-dimensional MRCP images were rendered by post processing. CONTRAST:  7.5 cc Gadavist COMPARISON:  Abdominopelvic CT 03/05/2017.  PET-CT 09/16/2017. FINDINGS: Despite efforts by the technologist and patient, mild motion artifact is present on today's exam and could not be eliminated. This reduces exam sensitivity and specificity. Lower chest: There are small bilateral pleural effusions with mild dependent atelectasis at both lung bases. Hepatobiliary: Interval marked enlargement of the liver which measures approximately 25.2 x 16.5 x 29.4 cm. There are innumerable hepatic lesions which have markedly progressed compared with the PET-CT of 6 weeks ago. Most of the lesions demonstrate low T1 signal, although there are a few lesions with intrinsic T1 shortening. There is heterogeneous T2 signal with areas of hyperintensity. Following contrast, some of the lesions demonstrate heterogeneous peripheral enhancement. Many of the lesions appear necrotic without central enhancement. The vast majority of the hepatic parenchyma is replaced by tumor. The gallbladder appears contracted and is not well visualized. There is no extrahepatic biliary dilatation. Pancreas: Unremarkable. No pancreatic ductal dilatation or surrounding inflammatory changes. Spleen: Normal in size without focal  abnormality. Adrenals/Urinary Tract: Both adrenal glands appear normal. There is a simple cyst measuring 2.8 cm in the upper pole the left kidney. No evidence of enhancing renal mass or hydronephrosis. Stomach/Bowel: No evidence of bowel wall thickening, distention or surrounding inflammatory change. Vascular/Lymphatic: There are multiple enlarged lymph nodes within the porta hepatis and upper retroperitoneum, most obvious on the diffusion-weighted images. These were hypermetabolic on previous PET-CT. No acute vascular findings identified. Other: Small amount of perihepatic ascites. Musculoskeletal: Although the marrow appears heterogeneous on diffusion weighted imaging, no focal lesions or abnormal enhancement identified within the spine on T2 weighted or post-contrast imaging. A simple lipoma is noted posterior to the left scapular tip. IMPRESSION: 1. Marked progression of diffuse hepatic metastatic disease compared with PET-CT of 09/16/2017. The liver is markedly enlarged and nearly completely replaced by tumor. 2. Associated metastatic lymphadenopathy in the porta hepatis and upper retroperitoneum. 3. No evidence of biliary dilatation. The gallbladder is contracted and not well visualized. Jaundice is likely secondary to intrahepatic cholestasis. 4. Small bilateral pleural effusions and mild ascites. Electronically Signed   By: Carey Bullocks M.D.   On: 11/01/2017 14:42       Subjective: Patient is somnolent today. Recently got pain medications.  Discharge Exam: Vitals:   11/03/17 2008 11/03/17 2102 10/29/2017 0658 11/17/2017 1345  BP:  (!) 142/67 139/79 124/65  Pulse:  81 90 91  Resp:  17 18 16   Temp:  97.8 F (36.6 C) 97.7 F (36.5 C) 97.7 F (36.5 C)  TempSrc:  Oral Oral Oral  SpO2: 93% 95% 94% 91%  Weight:  Height:        General: Pt is somnolent, no distress, jaundiced Cardiovascular: RRR, S1/S2 +, no rubs, no gallops Respiratory: CTA bilaterally, no wheezing, no  rhonchi Abdominal: Soft, NT, ND, bowel sounds + Extremities: no edema, no cyanosis    The results of significant diagnostics from this hospitalization (including imaging, microbiology, ancillary and laboratory) are listed below for reference.     Microbiology: No results found for this or any previous visit (from the past 240 hour(s)).   Labs: BNP (last 3 results) No results for input(s): BNP in the last 8760 hours. Basic Metabolic Panel: Recent Labs  Lab 11/01/17 0836 11/02/17 0439 11/03/17 0443 11/12/2017 0433  NA 126* 127* 129* 131*  K 4.3 4.4 4.2 4.4  CL 94* 95* 100 103  CO2 18* 19* 20* 17*  GLUCOSE 130* 133* 129* 121*  BUN 42* 49* 53* 49*  CREATININE 1.34* 1.22 1.19 1.06  CALCIUM 8.6* 8.3* 8.0* 7.9*  MG  --  2.6* 2.7* 2.8*   Liver Function Tests: Recent Labs  Lab 11/01/17 0836 11/02/17 0439 11/02/17 1434 11/03/17 0443 11/06/2017 0433  AST 216* 189* 174* 158* 153*  ALT 167* 154* 147* 139* 139*  ALKPHOS 929* 884* 873* 829* 769*  BILITOT 10.6* 11.5* 11.0* 10.8* 11.6*  PROT 6.3* 5.9* 5.7* 5.4* 5.0*  ALBUMIN 2.7* 2.4* 2.4* 2.3* 2.2*   No results for input(s): LIPASE, AMYLASE in the last 168 hours. No results for input(s): AMMONIA in the last 168 hours. CBC: Recent Labs  Lab 11/01/17 0836 11/02/17 0439 11/03/17 0443 10/27/2017 0433  WBC 20.3* 17.6* 23.4* 18.6*  NEUTROABS 17.4* 16.3* 21.0* 16.6*  HGB 12.9* 12.3* 11.8* 12.2*  HCT 37.8* 35.8* 34.6* 35.0*  MCV 93.6 93.7 92.3 94.1  PLT 171 152 162 159   Cardiac Enzymes: No results for input(s): CKTOTAL, CKMB, CKMBINDEX, TROPONINI in the last 168 hours. BNP: Invalid input(s): POCBNP CBG: No results for input(s): GLUCAP in the last 168 hours. D-Dimer No results for input(s): DDIMER in the last 72 hours. Hgb A1c No results for input(s): HGBA1C in the last 72 hours. Lipid Profile No results for input(s): CHOL, HDL, LDLCALC, TRIG, CHOLHDL, LDLDIRECT in the last 72 hours. Thyroid function studies No results  for input(s): TSH, T4TOTAL, T3FREE, THYROIDAB in the last 72 hours.  Invalid input(s): FREET3 Anemia work up No results for input(s): VITAMINB12, FOLATE, FERRITIN, TIBC, IRON, RETICCTPCT in the last 72 hours. Urinalysis    Component Value Date/Time   COLORURINE AMBER (A) 11/02/2017 0600   APPEARANCEUR TURBID (A) 10/31/2017 0600   LABSPEC 1.018 10/31/2017 0600   PHURINE 9.0 (H) 10/31/2017 0600   GLUCOSEU NEGATIVE 11/05/2017 0600   HGBUR SMALL (A) 11/10/2017 0600   BILIRUBINUR MODERATE (A) 11/13/2017 0600   KETONESUR NEGATIVE 11/02/2017 0600   PROTEINUR 100 (A) 11/01/2017 0600   NITRITE NEGATIVE 11/13/2017 0600   LEUKOCYTESUR TRACE (A) 11/13/2017 0600   Sepsis Labs Invalid input(s): PROCALCITONIN,  WBC,  LACTICIDVEN Microbiology No results found for this or any previous visit (from the past 240 hour(s)).   Time coordinating discharge:  SIGNED:   Erick Blinks, MD  [facility] 11/11/2017, 4:11 PM Pager   If 7PM-7AM, please contact night-coverage [email or web address] Password [email or web address]

## 2017-11-04 NOTE — [place] FL2 (Signed)
[place] MEDICAID FL2 LEVEL OF CARE SCREENING TOOL     IDENTIFICATION  Patient Name: Ian Christensen Birthdate: May 16, 1931 Sex: male Admission Date (Current Location): 11/01/2017  [facility] and [place] Number:  [employer] and Address:  [facility],  [address] [place] [address]      Provider Number: [id number]  Attending Physician Name and Address:  Erick Blinks, MD  Relative Name and Phone Number:       Current Level of Care: Hospital Recommended Level of Care: Skilled Nursing Facility Prior Approval Number:    Date Approved/Denied:   PASRR Number: [id number]  Discharge Plan: Home    Current Diagnoses: Patient Active Problem List   Diagnosis Date Noted  . Leg DVT (deep venous thromboembolism), acute, right (HCC) 11/02/2017  . Palliative care by specialist   . Encounter for hospice care discussion   . Acute hepatitis 11/01/2017  . Jaundice 11/01/2017  . AKI (acute kidney injury) (HCC) 11/01/2017  . Leukocytosis 11/01/2017  . Malignant melanoma (HCC) 10/24/2017  . Goals of care, counseling/discussion 10/24/2017  . Esophageal dysphagia   . 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 SBO (small bowel obstruction) (HCC) 03/05/2017  . Incisional hernia of anterior abdominal wall without obstruction or gangrene 03/02/2017  . Pulmonary nodules/lesions, multiple 10/17/2014  . Bilateral renal masses 10/17/2014  . Rectal cancer (HCC) 10/07/2014  . Hypertension 11/17/2011  . High cholesterol 11/17/2011  . Prostate cancer (HCC) 11/17/2011  . Colon polyps 11/17/2011    Orientation RESPIRATION BLADDER Height & Weight     Self  Normal Incontinent Weight: 178 lb 12.7 oz (81.1 kg) Height:  5\' 9"  (175.3 cm)  BEHAVIORAL SYMPTOMS/MOOD NEUROLOGICAL BOWEL NUTRITION STATUS      Incontinent (Full Liquid currently. See discharge summary)  AMBULATORY STATUS COMMUNICATION OF NEEDS Skin   Total Care Verbally  Normal                       Personal Care Assistance Level of Assistance  Bathing, Dressing, Feeding Bathing Assistance: Maximum assistance Feeding assistance: Maximum assistance Dressing Assistance: Maximum assistance     Functional Limitations Info  Sight, Hearing, Speech Sight Info: Adequate Hearing Info: Adequate Speech Info: Adequate    SPECIAL CARE FACTORS FREQUENCY                       Contractures Contractures Info: Not present    Additional Factors Info  Code Status, Allergies Code Status Info: DNR Allergies Info: Morphine and related, Oxycodone           Current Medications (10/25/2017):  This is the current hospital active medication list Current Facility-Administered Medications  Medication Dose Route Frequency Provider Last Rate Last Dose  . 0.9 %  sodium chloride infusion   Intravenous Continuous Johnson, Clanford L, MD 35 mL/hr at 10/29/2017 1024    . amLODipine (NORVASC) tablet 5 mg  5 mg Oral Daily Johnson, Clanford L, MD   5 mg at 11/13/2017 1246  . apixaban (ELIQUIS) tablet 10 mg  10 mg Oral BID Johnson, Clanford L, MD   10 mg at 11/15/2017 1246   Followed by  . [START ON 12-05-17] apixaban (ELIQUIS) tablet 5 mg  5 mg Oral BID Johnson, Clanford L, MD      . chlorproMAZINE (THORAZINE) tablet 25 mg  25 mg Oral TID PRN Laural Benes, Clanford L, MD   25 mg at 11/03/17 1340  .  fentaNYL (SUBLIMAZE) injection 12.5-25 mcg  12.5-25 mcg Intravenous Q2H PRN Johnson, Clanford L, MD      . hydrALAZINE (APRESOLINE) injection 10 mg  10 mg Intravenous Q4H PRN Johnson, Clanford L, MD      . metoprolol succinate (TOPROL-XL) 24 hr tablet 25 mg  25 mg Oral BID Johnson, Clanford L, MD   25 mg at November 25, 2017 1245  . ondansetron (ZOFRAN) tablet 4 mg  4 mg Oral Q6H PRN Johnson, Clanford L, MD       Or  . ondansetron (ZOFRAN) injection 4 mg  4 mg Intravenous Q6H PRN Johnson, Clanford L, MD      . pantoprazole (PROTONIX) EC tablet 40 mg  40 mg Oral Daily Johnson, Clanford L,  MD   40 mg at 11-25-2017 1245  . predniSONE (DELTASONE) tablet 80 mg  80 mg Oral Q1500 Johnson, Clanford L, MD   80 mg at 11/03/17 1714  . prochlorperazine (COMPAZINE) tablet 10 mg  10 mg Oral Q6H PRN Johnson, Clanford L, MD      . traMADol (ULTRAM) tablet 50 mg  50 mg Oral Q6H PRN Laural Benes, Clanford L, MD   50 mg at 11/03/17 2021  . zolpidem (AMBIEN) tablet 5 mg  5 mg Oral QHS PRN Laural Benes, Clanford L, MD   5 mg at 11/03/17 2021     Discharge Medications: Please see discharge summary for a list of discharge medications.  Relevant Imaging Results:  Relevant Lab Results:   Additional Information SSN: [id number].  Harlin Mazzoni, Juleen China, LCSW

## 2017-11-04 NOTE — Clinical Social Work Placement (Addendum)
CLINICAL SOCIAL WORK PLACEMENT  NOTE  Date:  11/13/2017  Patient Details  Name: Ian Christensen MRN: [id number] Date of Birth: 1931/10/09  Clinical Social Work is seeking post-discharge placement for this patient at the Skilled  Nursing Facility level of care (*CSW Ian initial, date and re-position this form in  chart as items are completed):  Yes   Patient/family provided with [place] Clinical Social Work Department's list of facilities offering this level of care within the geographic area requested by the patient (or if unable, by the patient's family).  Yes   Patient/family informed of their freedom to choose among providers that offer the needed level of care, that participate in Medicare, Medicaid or managed care program needed by the patient, have an available bed and are willing to accept the patient.  Yes   Patient/family informed of [facility]'s ownership interest in [facility] and [facility], as well as of the fact that they are under no obligation to receive care at these facilities.  PASRR submitted to EDS on       PASRR number received on       Existing PASRR number confirmed on 10/20/2017     FL2 transmitted to all facilities in geographic area requested by pt/family on 10/24/2017     FL2 transmitted to all facilities within larger geographic area on       Patient informed that his/her managed care company has contracts with or Ian negotiate with certain facilities, including the following:        Yes   Patient/family informed of bed offers received.  Patient chooses bed at [facility]     Physician recommends and patient chooses bed at      Patient to be transferred to [facility] on 10/22/2017.  Patient to be transferred to facility by [facility] staff     Patient family notified on 11/12/2017 of transfer.  Name of family member notified:  [name], daughter     PHYSICIAN       Additional Comment:  Facility to get discharge clinicals  from hub once patient is discharged. Daughter [name] spoke with Lorina Rabon and has arranged to make payment prior to today's admission. LCSW notified Debbie of discharge today.  LCSW signing off.   _______________________________________________ Annice Needy, LCSW 11/07/2017, 3:55 PM

## 2017-11-04 NOTE — Progress Notes (Signed)
Report called to [facility]. Discharge plan reviewed with patient and patients daughters. Patient and daughters verbalized understanding of plan.  patient transported to [facility] in stable condition with family at bedside.

## 2017-11-07 ENCOUNTER — Other Ambulatory Visit: Payer: Self-pay

## 2017-11-07 ENCOUNTER — Non-Acute Institutional Stay (SKILLED_NURSING_FACILITY): Payer: PPO | Admitting: Internal Medicine

## 2017-11-07 ENCOUNTER — Encounter: Payer: Self-pay | Admitting: Internal Medicine

## 2017-11-07 DIAGNOSIS — B179 Acute viral hepatitis, unspecified: Secondary | ICD-10-CM | POA: Diagnosis not present

## 2017-11-07 DIAGNOSIS — G479 Sleep disorder, unspecified: Secondary | ICD-10-CM

## 2017-11-07 DIAGNOSIS — R17 Unspecified jaundice: Secondary | ICD-10-CM

## 2017-11-07 DIAGNOSIS — C439 Malignant melanoma of skin, unspecified: Secondary | ICD-10-CM

## 2017-11-07 DIAGNOSIS — I82401 Acute embolism and thrombosis of unspecified deep veins of right lower extremity: Secondary | ICD-10-CM

## 2017-11-07 LAB — URINE CULTURE

## 2017-11-07 MED ORDER — TRAMADOL HCL 50 MG PO TABS: 50.0000 mg | ORAL_TABLET | Freq: Four times a day (QID) | ORAL | 0 refills | Status: DC | PRN

## 2017-11-07 MED ORDER — ZOLPIDEM TARTRATE 5 MG PO TABS: 5.0000 mg | ORAL_TABLET | Freq: Every evening | ORAL | 0 refills | Status: AC | PRN

## 2017-11-07 NOTE — Telephone Encounter (Signed)
RX Fax for [facility]@ [phone]  

## 2017-11-07 NOTE — Progress Notes (Signed)
Provider:Eleazar Kimmey Chales Abrahams MD   Location:    [facility] Room Number: 136/P Place of Service:  SNF (31)  PCP: Elfredia Nevins, MD Patient Care Team: Elfredia Nevins, MD as PCP - General (Internal Medicine)  Extended Emergency Contact Information Primary Emergency Contact: [facility] Address: [address]          [place], [place] [address] [place] Home Phone: [phone] Mobile Phone: [phone] Relation: Daughter Secondary Emergency Contact: Monte [place] of [place] Home Phone: [phone] Relation: Daughter  Code Status: DNR Goals of Care: Advanced Directive information Advanced Directives 11/07/2017  Does Patient Have a Medical Advance Directive? Yes  Type of Advance Directive Out of facility DNR (pink MOST or yellow form)  Does patient want to make changes to medical advance directive? No - Patient declined  Copy of Healthcare Power of Attorney in Chart? No - copy requested  Would patient like information on creating a medical advance directive? No - Patient declined  Some encounter information is confidential and restricted. Go to Review Flowsheets activity to see all data.      Chief Complaint  Patient presents with  . New Admit To SNF    New Admission Visit    HPI: Patient is a 84 y.o. male seen today for admission to SNF for Hospice care.  Patient has h/o Malignant Melanoma and Squamous Cell Carcinoma of Distal Esophagus, Colorectal Cancer s/p Colostomy bag, Prostate Cancer, S/P Urostomy Bag, S/P Peg placement. Patient came to his Oncology Office with Nausea, Pain and Jaundice. Was found to have Bilirubin of 10.6. MRCP showed Diffuse Metastatic Liver disease. He was started on Steroids. He has opted for Hospice care. He also was diagnosed with Right DVT and is on Eliquis for that. Patient main complain today was pain control. He has been intolerant to Morphine and Oxycodone before with Hallucinations. And  his daughter wanted to stay with Ultram for now. Patient denies any Nausea or Abdominal Pain. He has been progressively getting weak at home and had 24 hours Caregivers. Can only tolerate PO liquid diet. Has not used PEG tube for last few months.     Past Medical History:  Diagnosis Date  . Acute hepatitis 11/01/2017  . AKI (acute kidney injury) (HCC) 11/01/2017  . Arthritis   . Colostomy in place [facility])   . Esophageal cancer (HCC)   . GERD (gastroesophageal reflux disease)   . History of colon polyps   . History of hiatal hernia   . 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 HTN (hypertension)    followed by pcp  . Hyperlipidemia   . Jaundice 11/01/2017  . Leukocytosis 11/01/2017  . Presence of urostomy (HCC)   . Rectal cancer (HCC) 10/07/2014   no chemo or radiation/ bladder was removed also  . Tinnitus    per pt intermittant   . Wears glasses    Past Surgical History:  Procedure Laterality Date  . APPENDECTOMY  1955  . COLONOSCOPY    . CYSTO N/A 12/11/2014   Procedure:  CYSTO,  PROSTATECTOMY, CYSTECTOMY colon conduit, BILATERAL LYMPH NODE DISSECTION;  Surgeon: Sebastian Ache, MD;  Location: [facility];  Service: Urology;  Laterality: N/A;  . ESOPHAGOGASTRODUODENOSCOPY (EGD) WITH PROPOFOL N/A 07/05/2017   Procedure: ESOPHAGOGASTRODUODENOSCOPY (EGD) WITH PROPOFOL;  Surgeon: Franky Macho, MD;  Location: [facility];  Service: General;  Laterality: N/A;  .  EUS N/A 10/10/2014   Procedure: LOWER ENDOSCOPIC ULTRASOUND (EUS);  Surgeon: Rachael Fee, MD;  Location: [facility];  Service: Endoscopy;  Laterality: N/A;  . HERNIA REPAIR    . INCISIONAL HERNIA REPAIR N/A 03/02/2017   Procedure: INCISIONAL HERNIA REPAIR WITH MESH;  Surgeon: Romie Levee, MD;  Location: [facility];  Service: General;  Laterality: N/A;  . INSERTION OF MESH  03/02/2017   Procedure: INSERTION OF MESH;   Surgeon: Romie Levee, MD;  Location: [facility];  Service: General;;  . LAPAROSCOPIC INTERNAL HERNIA REPAIR N/A 03/06/2017   Procedure: REOPENING OF RECENT LAPAROTOMY WITH CLOSURE OF INCISIONAL HERNIA WITH MESH;  Surgeon: Romie Levee, MD;  Location: [facility];  Service: General;  Laterality: N/A;  . PEG PLACEMENT N/A 07/05/2017   Procedure: PERCUTANEOUS ENDOSCOPIC GASTROSTOMY (PEG) PLACEMENT;  Surgeon: Franky Macho, MD;  Location: [facility];  Service: General;  Laterality: N/A;  . PORTACATH PLACEMENT Left 07/05/2017   Procedure: INSERTION PORT WITH ATTACHED CATHETER IN LEFT SUBCLAVIAN;  Surgeon: Franky Macho, MD;  Location: [facility];  Service: General;  Laterality: Left;  . RADIOACTIVE PROSTATE SEED IMPLANTS  02-06-1999  dr Annabell Howells  [facility]  . XI ROBOTIC ASSISTED LOWER ANTERIOR RESECTION N/A 12/11/2014   Procedure: XI ROBOTIC ASSISTED ABDOMINAL PERINEAL RESECTION OMENTOPLASTY;  Surgeon: Romie Levee, MD;  Location: [facility];  Service: General;  Laterality: N/A;    reports that he has never smoked. He has never used smokeless tobacco. He reports that he drinks alcohol. He reports that he does not use drugs. Social History   Socioeconomic History  . Marital status: Widowed    Spouse name: Not on file  . Number of children: 2  . Years of education: Not on file  . Highest education level: Not on file  Occupational History  . Occupation: retired  Engineer, production  . Financial resource strain: Not very hard  . Food insecurity:    Worry: Never true    Inability: Never true  . Transportation needs:    Medical: No    Non-medical: No  Tobacco Use  . Smoking status: Never Smoker  . Smokeless tobacco: Never Used  Substance and Sexual Activity  . Alcohol use: Yes    Alcohol/week: 0.0 standard drinks    Comment: seldom  . Drug use: No  . Sexual activity: Not Currently    Comment: Widowed  Lifestyle  . Physical activity:    Days per week: 1 day    Minutes per session: 30 min  . Stress: Not at all    Relationships  . Social connections:    Talks on phone: Three times a week    Gets together: Three times a week    Attends religious service: More than 4 times per year    Active member of club or organization: Yes    Attends meetings of clubs or organizations: More than 4 times per year    Relationship status: Widowed  . Intimate partner violence:    Fear of current or ex partner: No    Emotionally abused: No    Physically abused: No    Forced sexual activity: No  Other Topics Concern  . Not on file  Social History Narrative  . Not on file    Functional Status Survey:    Family History  Problem Relation Age of Onset  . Diabetes Mother   . Heart disease Mother   . Heart disease Father   . Colon cancer Sister   . Prostate cancer Brother   .  Esophageal cancer Neg Hx   . Rectal cancer Neg Hx   . Stomach cancer Neg Hx     Health Maintenance  Topic Date Due  . TETANUS/TDAP  12/08/2017 (Originally 07/27/1950)  . PNA vac Low Risk Adult (2 of 2 - PCV13) 03/08/2018  . COLONOSCOPY  12/31/2018  . INFLUENZA VACCINE  Completed    Allergies  Allergen Reactions  . Morphine And Related     Caused hallucinations.  . Oxycodone     Caused hallucinations    Allergies as of 11/07/2017      Reactions   Morphine And Related    Caused hallucinations.   Oxycodone    Caused hallucinations      Medication List        Accurate as of 11/07/17  2:12 PM. Always use your most recent med list.          amLODipine 5 MG tablet Commonly known as:  NORVASC Take 1 tablet (5 mg total) by mouth daily.   ELIQUIS 5 MG Tabs tablet Generic drug:  apixaban Take 5 mg by mouth 2 (two) times daily. Starting 11-27-2017   apixaban 5 MG Tabs tablet Commonly known as:  ELIQUIS 10mg  po bid for 4 more days then 5mg  po bid   chlorproMAZINE 25 MG tablet Commonly known as:  THORAZINE Take 1 tablet (25 mg total) by mouth 3 (three) times daily as needed for hiccoughs.   metoprolol succinate  25 MG 24 hr tablet Commonly known as:  TOPROL-XL Take 25 mg by mouth 2 (two) times daily.   omeprazole 20 MG capsule Commonly known as:  PRILOSEC Take 20 mg by mouth every evening.   predniSONE 20 MG tablet Commonly known as:  DELTASONE Take 2 tablets (40 mg total) by mouth daily at 3 pm.   prochlorperazine 10 MG tablet Commonly known as:  COMPAZINE Take 1 tablet (10 mg total) by mouth every 6 (six) hours as needed for nausea or vomiting.   traMADol 50 MG tablet Commonly known as:  ULTRAM Take 1 tablet (50 mg total) by mouth every 6 (six) hours as needed for moderate pain.   zolpidem 5 MG tablet Commonly known as:  AMBIEN Take 1 tablet (5 mg total) by mouth at bedtime as needed for sleep.       Review of Systems  Constitutional: Positive for activity change, appetite change and fatigue.  HENT: Negative.   Respiratory: Negative.   Cardiovascular: Positive for leg swelling.  Gastrointestinal: Negative.   Genitourinary: Negative.   Musculoskeletal: Positive for back pain and myalgias.  Skin: Negative.   Neurological: Positive for weakness.  Psychiatric/Behavioral: Negative.     Vitals:   11/07/17 1410  BP: 106/64  Pulse: 94  Resp: 20  Temp: (!) 97.2 F (36.2 C)  TempSrc: Oral   There is no height or weight on file to calculate BMI. Physical Exam  Constitutional: He is oriented to person, place, and time. He appears well-developed and well-nourished.  HENT:  Head: Normocephalic.  Positive for Icterus Mild Thrush  Eyes: Pupils are equal, round, and reactive to light.  Neck: Neck supple.  Cardiovascular: Normal rate and regular rhythm.  Pulmonary/Chest: Effort normal and breath sounds normal. No stridor. No respiratory distress.  Abdominal: Soft. Bowel sounds are normal. He exhibits no distension. There is no tenderness. There is no guarding.  Musculoskeletal: He exhibits edema.  Neurological: He is alert and oriented to person, place, and time. He displays  normal reflexes. No cranial nerve deficit.  Coordination normal.  Skin: Skin is warm and dry.  Psychiatric: He has a normal mood and affect. His behavior is normal.    Labs reviewed: Basic Metabolic Panel: Recent Labs    11/02/17 0439 11/03/17 0443 11/29/17 0433  NA 127* 129* 131*  K 4.4 4.2 4.4  CL 95* 100 103  CO2 19* 20* 17*  GLUCOSE 133* 129* 121*  BUN 49* 53* 49*  CREATININE 1.22 1.19 1.06  CALCIUM 8.3* 8.0* 7.9*  MG 2.6* 2.7* 2.8*   Liver Function Tests: Recent Labs    11/02/17 1434 11/03/17 0443 Nov 29, 2017 0433  AST 174* 158* 153*  ALT 147* 139* 139*  ALKPHOS 873* 829* 769*  BILITOT 11.0* 10.8* 11.6*  PROT 5.7* 5.4* 5.0*  ALBUMIN 2.4* 2.3* 2.2*   Recent Labs    03/05/17 1524  LIPASE 26   No results for input(s): AMMONIA in the last 8760 hours. CBC: Recent Labs    11/02/17 0439 11/03/17 0443 2017/11/29 0433  WBC 17.6* 23.4* 18.6*  NEUTROABS 16.3* 21.0* 16.6*  HGB 12.3* 11.8* 12.2*  HCT 35.8* 34.6* 35.0*  MCV 93.7 92.3 94.1  PLT 152 162 159   Cardiac Enzymes: No results for input(s): CKTOTAL, CKMB, CKMBINDEX, TROPONINI in the last 8760 hours. BNP: Invalid input(s): POCBNP Lab Results  Component Value Date   HGBA1C 5.8 (H) 12/05/2014   No results found for: TSH No results found for: VITAMINB12 No results found for: FOLATE No results found for: IRON, TIBC, FERRITIN  Imaging and Procedures obtained prior to SNF admission: Mr 3d Recon At Scanner  Result Date: 11/01/2017 CLINICAL DATA:  Jaundice with left flank pain, generalized malaise and increasing weakness. History of squamous cell esophageal cancer, rectal cancer and remote prostate cancer. Undergoing immunotherapy. EXAM: MRI ABDOMEN WITHOUT AND WITH CONTRAST (INCLUDING MRCP) TECHNIQUE: Multiplanar multisequence MR imaging of the abdomen was performed both before and after the administration of intravenous contrast. Heavily T2-weighted images of the biliary and pancreatic ducts were obtained, and  three-dimensional MRCP images were rendered by post processing. CONTRAST:  7.5 cc Gadavist COMPARISON:  Abdominopelvic CT 03/05/2017.  PET-CT 09/16/2017. FINDINGS: Despite efforts by the technologist and patient, mild motion artifact is present on today's exam and could not be eliminated. This reduces exam sensitivity and specificity. Lower chest: There are small bilateral pleural effusions with mild dependent atelectasis at both lung bases. Hepatobiliary: Interval marked enlargement of the liver which measures approximately 25.2 x 16.5 x 29.4 cm. There are innumerable hepatic lesions which have markedly progressed compared with the PET-CT of 6 weeks ago. Most of the lesions demonstrate low T1 signal, although there are a few lesions with intrinsic T1 shortening. There is heterogeneous T2 signal with areas of hyperintensity. Following contrast, some of the lesions demonstrate heterogeneous peripheral enhancement. Many of the lesions appear necrotic without central enhancement. The vast majority of the hepatic parenchyma is replaced by tumor. The gallbladder appears contracted and is not well visualized. There is no extrahepatic biliary dilatation. Pancreas: Unremarkable. No pancreatic ductal dilatation or surrounding inflammatory changes. Spleen: Normal in size without focal abnormality. Adrenals/Urinary Tract: Both adrenal glands appear normal. There is a simple cyst measuring 2.8 cm in the upper pole the left kidney. No evidence of enhancing renal mass or hydronephrosis. Stomach/Bowel: No evidence of bowel wall thickening, distention or surrounding inflammatory change. Vascular/Lymphatic: There are multiple enlarged lymph nodes within the porta hepatis and upper retroperitoneum, most obvious on the diffusion-weighted images. These were hypermetabolic on previous PET-CT. No acute vascular findings identified.  Other: Small amount of perihepatic ascites. Musculoskeletal: Although the marrow appears heterogeneous on  diffusion weighted imaging, no focal lesions or abnormal enhancement identified within the spine on T2 weighted or post-contrast imaging. A simple lipoma is noted posterior to the left scapular tip. IMPRESSION: 1. Marked progression of diffuse hepatic metastatic disease compared with PET-CT of 09/16/2017. The liver is markedly enlarged and nearly completely replaced by tumor. 2. Associated metastatic lymphadenopathy in the porta hepatis and upper retroperitoneum. 3. No evidence of biliary dilatation. The gallbladder is contracted and not well visualized. Jaundice is likely secondary to intrahepatic cholestasis. 4. Small bilateral pleural effusions and mild ascites. Electronically Signed   By: Carey Bullocks M.D.   On: 11/01/2017 14:42   US Venous Img Lower Unilateral Right  Result Date: 11/02/2017 CLINICAL DATA:  Right lower extremity edema. EXAM: RIGHT LOWER EXTREMITY VENOUS DOPPLER ULTRASOUND TECHNIQUE: Gray-scale sonography with graded compression, as well as color Doppler and duplex ultrasound were performed to evaluate the lower extremity deep venous systems from the level of the common femoral vein and including the common femoral, femoral, profunda femoral, popliteal and calf veins including the posterior tibial, peroneal and gastrocnemius veins when visible. The superficial great saphenous vein was also interrogated. Spectral Doppler was utilized to evaluate flow at rest and with distal augmentation maneuvers in the common femoral, femoral and popliteal veins. COMPARISON:  None. FINDINGS: Contralateral Common Femoral Vein: Respiratory phasicity is normal and symmetric with the symptomatic side. No evidence of thrombus. Normal compressibility. Common Femoral Vein: Occlusive thrombus present up to the saphenofemoral junction. The saphenofemoral junction itself is open. Saphenofemoral Junction: No evidence of thrombus. Profunda Femoral Vein: No evidence of thrombus. Normal compressibility and flow on  color Doppler imaging. Femoral Vein: Occlusive thrombus present throughout the femoral vein in the thigh. Popliteal Vein: Occlusive thrombus present. Calf Veins: Thrombus extends into posterior tibial and peroneal veins in the calf. Superficial Great Saphenous Vein: No evidence of thrombus. Normal compressibility. Venous Reflux:  None. Other Findings: No evidence of superficial thrombophlebitis or abnormal fluid collection. IMPRESSION: Positive for right lower extremity deep venous thrombosis affecting the common femoral vein, femoral vein, popliteal vein and visualized proximal calf veins. Thrombus appears acute/subacute by ultrasound. Electronically Signed   By: Irish Lack M.D.   On: 11/02/2017 10:04   Dg Chest Port 1 View  Result Date: 11/01/2017 CLINICAL DATA:  Acute hepatitis. EXAM: PORTABLE CHEST 1 VIEW COMPARISON:  Chest x-ray dated July 05, 2017. FINDINGS: Unchanged left chest wall port catheter with tip in the mid SVC. The heart size and mediastinal contours are within normal limits. Normal pulmonary vascularity. No focal consolidation, pleural effusion, or pneumothorax. No acute osseous abnormality. IMPRESSION: No active disease. Electronically Signed   By: Obie Dredge M.D.   On: 11/01/2017 13:40   Mr Abdomen Mrcp Vivien Rossetti Contast  Result Date: 11/01/2017 CLINICAL DATA:  Jaundice with left flank pain, generalized malaise and increasing weakness. History of squamous cell esophageal cancer, rectal cancer and remote prostate cancer. Undergoing immunotherapy. EXAM: MRI ABDOMEN WITHOUT AND WITH CONTRAST (INCLUDING MRCP) TECHNIQUE: Multiplanar multisequence MR imaging of the abdomen was performed both before and after the administration of intravenous contrast. Heavily T2-weighted images of the biliary and pancreatic ducts were obtained, and three-dimensional MRCP images were rendered by post processing. CONTRAST:  7.5 cc Gadavist COMPARISON:  Abdominopelvic CT 03/05/2017.  PET-CT 09/16/2017.  FINDINGS: Despite efforts by the technologist and patient, mild motion artifact is present on today's exam and could not be eliminated. This  reduces exam sensitivity and specificity. Lower chest: There are small bilateral pleural effusions with mild dependent atelectasis at both lung bases. Hepatobiliary: Interval marked enlargement of the liver which measures approximately 25.2 x 16.5 x 29.4 cm. There are innumerable hepatic lesions which have markedly progressed compared with the PET-CT of 6 weeks ago. Most of the lesions demonstrate low T1 signal, although there are a few lesions with intrinsic T1 shortening. There is heterogeneous T2 signal with areas of hyperintensity. Following contrast, some of the lesions demonstrate heterogeneous peripheral enhancement. Many of the lesions appear necrotic without central enhancement. The vast majority of the hepatic parenchyma is replaced by tumor. The gallbladder appears contracted and is not well visualized. There is no extrahepatic biliary dilatation. Pancreas: Unremarkable. No pancreatic ductal dilatation or surrounding inflammatory changes. Spleen: Normal in size without focal abnormality. Adrenals/Urinary Tract: Both adrenal glands appear normal. There is a simple cyst measuring 2.8 cm in the upper pole the left kidney. No evidence of enhancing renal mass or hydronephrosis. Stomach/Bowel: No evidence of bowel wall thickening, distention or surrounding inflammatory change. Vascular/Lymphatic: There are multiple enlarged lymph nodes within the porta hepatis and upper retroperitoneum, most obvious on the diffusion-weighted images. These were hypermetabolic on previous PET-CT. No acute vascular findings identified. Other: Small amount of perihepatic ascites. Musculoskeletal: Although the marrow appears heterogeneous on diffusion weighted imaging, no focal lesions or abnormal enhancement identified within the spine on T2 weighted or post-contrast imaging. A simple lipoma  is noted posterior to the left scapular tip. IMPRESSION: 1. Marked progression of diffuse hepatic metastatic disease compared with PET-CT of 09/16/2017. The liver is markedly enlarged and nearly completely replaced by tumor. 2. Associated metastatic lymphadenopathy in the porta hepatis and upper retroperitoneum. 3. No evidence of biliary dilatation. The gallbladder is contracted and not well visualized. Jaundice is likely secondary to intrahepatic cholestasis. 4. Small bilateral pleural effusions and mild ascites. Electronically Signed   By: Carey Bullocks M.D.   On: 11/01/2017 14:42    Assessment/Plan Malignant melanoma with Metastatic Disease to Liver with Jaundice and Acute Hepatitis. Hospice Care Patient wants to stay on Ultram for now and not do Morphine Start on Ultram 50 mg Q4 Ativan 0.5 Mg Q4 Continue Prednisone Will Use Roxanol if Ultram unable to control Pain D/W the daughters and Hospice [facility]   Acute Right DVT Continue On Eliquis for Now Dysphagia Can only Tolerate Liquids PO PEG tube not to be used. .   Family/ staff Communication:   Labs/tests ordered:  Total time spent in this patient care encounter was _45 minutes; greater than 50% of the visit spent counseling patient, reviewing records , Labs and coordinating care for problems addressed at this encounter.

## 2017-11-08 ENCOUNTER — Non-Acute Institutional Stay (SKILLED_NURSING_FACILITY): Payer: PPO | Admitting: Internal Medicine

## 2017-11-08 ENCOUNTER — Encounter: Payer: Self-pay | Admitting: Internal Medicine

## 2017-11-08 DIAGNOSIS — C787 Secondary malignant neoplasm of liver and intrahepatic bile duct: Secondary | ICD-10-CM

## 2017-11-08 DIAGNOSIS — Z515 Encounter for palliative care: Secondary | ICD-10-CM

## 2017-11-08 NOTE — Progress Notes (Signed)
Location:    [facility] Room Number: 136/P Place of Service:  SNF [phone]) Provider:  Jacelyn Pi, MD  Patient Care Team: Elfredia Nevins, MD as PCP - General (Internal Medicine)  Extended Emergency Contact Information Primary Emergency Contact: [facility] Address: [address]          [place], [place] [address] [place] Home Phone: [phone] Mobile Phone: [phone] Relation: Daughter Secondary Emergency Contact: [place] of [place] Home Phone: [phone] Relation: Daughter  Code Status:  DNR Goals of care: Advanced Directive information Advanced Directives 11/08/2017  Does Patient Have a Medical Advance Directive? Yes  Type of Advance Directive Out of facility DNR (pink MOST or yellow form)  Does patient want to make changes to medical advance directive? No - Patient declined  Copy of Healthcare Power of Attorney in Chart? No - copy requested  Would patient like information on creating a medical advance directive? No - Patient declined  Some encounter information is confidential and restricted. Go to Review Flowsheets activity to see all data.     Chief Complaint  Patient presents with  . Acute Visit    F/U Failure to thrive   With end of life care   HPI:  Pt is a 84 y.o. male seen today for an acute visit for follow-up of end-of-life issues.  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  Patient recently presented to his oncology office with nausea pain and jaundice it was found to have an elevated bilirubin-MRCP showed diffuse metastatic liver disease.  He was started on steroids- and has chosen hospice care.  He also was diagnosed  with a right DVT and continues on Eliquis for that.  He continues on tramadol routinely for pain- he is intolerant to more following and oxycodone.  Apparently he was alert and responsive yesterday-but today is not really talking or responding- blood pressure also was somewhat low in the 80s when I took it I got a systolic in the high 90s.  He does not appear to be in any distress but does appear to be declining   He does have an order for Ativan as needed but did not recently receive this- this change in status appears to be more related to his declining status in general with the above diagnoses.       Past Medical History:  Diagnosis Date  . Acute hepatitis 11/01/2017  . AKI (acute kidney injury) (HCC) 11/01/2017  . Arthritis   . Colostomy in place [facility])   . Esophageal cancer (HCC)   . GERD (gastroesophageal reflux disease)   . History of colon polyps   . History of hiatal hernia   . 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 HTN (hypertension)    followed by pcp  . Hyperlipidemia   . Jaundice 11/01/2017  . Leukocytosis 11/01/2017  . Presence of urostomy (HCC)   . Rectal cancer (HCC) 10/07/2014   no chemo or radiation/ bladder was removed also  . Tinnitus    per pt intermittant   . Wears glasses    Past Surgical  History:  Procedure Laterality Date  . APPENDECTOMY  1955  . COLONOSCOPY    . CYSTO N/A 12/11/2014   Procedure:  CYSTO,  PROSTATECTOMY, CYSTECTOMY colon conduit, BILATERAL LYMPH NODE DISSECTION;  Surgeon: Sebastian Ache, MD;  Location: [facility];  Service: Urology;  Laterality: N/A;  . ESOPHAGOGASTRODUODENOSCOPY (EGD) WITH PROPOFOL N/A 07/05/2017   Procedure: ESOPHAGOGASTRODUODENOSCOPY (EGD) WITH PROPOFOL;  Surgeon: Franky Macho, MD;  Location: [facility];  Service: General;  Laterality: N/A;  . EUS N/A 10/10/2014   Procedure: LOWER ENDOSCOPIC ULTRASOUND  (EUS);  Surgeon: Rachael Fee, MD;  Location: [facility];  Service: Endoscopy;  Laterality: N/A;  . HERNIA REPAIR    . INCISIONAL HERNIA REPAIR N/A 03/02/2017   Procedure: INCISIONAL HERNIA REPAIR WITH MESH;  Surgeon: Romie Levee, MD;  Location: [facility];  Service: General;  Laterality: N/A;  . INSERTION OF MESH  03/02/2017   Procedure: INSERTION OF MESH;  Surgeon: Romie Levee, MD;  Location: [facility];  Service: General;;  . LAPAROSCOPIC INTERNAL HERNIA REPAIR N/A 03/06/2017   Procedure: REOPENING OF RECENT LAPAROTOMY WITH CLOSURE OF INCISIONAL HERNIA WITH MESH;  Surgeon: Romie Levee, MD;  Location: [facility];  Service: General;  Laterality: N/A;  . PEG PLACEMENT N/A 07/05/2017   Procedure: PERCUTANEOUS ENDOSCOPIC GASTROSTOMY (PEG) PLACEMENT;  Surgeon: Franky Macho, MD;  Location: [facility];  Service: General;  Laterality: N/A;  . PORTACATH PLACEMENT Left 07/05/2017   Procedure: INSERTION PORT WITH ATTACHED CATHETER IN LEFT SUBCLAVIAN;  Surgeon: Franky Macho, MD;  Location: [facility];  Service: General;  Laterality: Left;  . RADIOACTIVE PROSTATE SEED IMPLANTS  02-06-1999  dr Annabell Howells  [facility]  . XI ROBOTIC ASSISTED LOWER ANTERIOR RESECTION N/A 12/11/2014   Procedure: XI ROBOTIC ASSISTED ABDOMINAL PERINEAL RESECTION OMENTOPLASTY;  Surgeon: Romie Levee, MD;  Location: [facility];  Service: General;  Laterality: N/A;    Allergies  Allergen Reactions  . Morphine And Related     Caused hallucinations.  . Oxycodone     Caused hallucinations    Outpatient Encounter Medications as of 11/08/2017  Medication Sig  . apixaban (ELIQUIS) 5 MG TABS tablet 10mg  po bid for 4 more days then 5mg  po bid  . apixaban (ELIQUIS) 5 MG TABS tablet Take 5 mg by mouth 2 (two) times daily. Starting 10/18/2017  . chlorproMAZINE (THORAZINE) 25 MG tablet Take 1 tablet (25 mg total) by mouth 3 (three) times daily as needed for hiccoughs.  Marland Kitchen LORazepam (ATIVAN) 0.5 MG tablet Take 0.5 mg by mouth every 4 (four) hours as needed for  anxiety.  Marland Kitchen LORazepam (ATIVAN) 0.5 MG tablet Take 1 mg by mouth every 4 (four) hours as needed for anxiety.  . metoprolol succinate (TOPROL-XL) 25 MG 24 hr tablet Take 25 mg by mouth 2 (two) times daily.  Marland Kitchen omeprazole (PRILOSEC) 20 MG capsule Take 20 mg by mouth every evening.   . predniSONE (DELTASONE) 20 MG tablet Take 2 tablets (40 mg total) by mouth daily at 3 pm.  . prochlorperazine (COMPAZINE) 10 MG tablet Take 1 tablet (10 mg total) by mouth every 6 (six) hours as needed for nausea or vomiting.  . traMADol (ULTRAM) 50 MG tablet Take 50 mg by mouth every 4 (four) hours.  Marland Kitchen zolpidem (AMBIEN) 5 MG tablet Take 1 tablet (5 mg total) by mouth at bedtime as needed for sleep.  . [DISCONTINUED] amLODipine (NORVASC) 5 MG tablet Take 1 tablet (5 mg total) by mouth daily.  . [DISCONTINUED] traMADol (ULTRAM) 50 MG tablet Take 1 tablet (50  mg total) by mouth every 6 (six) hours as needed for moderate pain. (Patient taking differently: Take 50 mg by mouth every 4 (four) hours. )   No facility-administered encounter medications on file as of 11/08/2017.     Review of Systems   Is unobtainable secondary patient not really responding to any conversation.    Immunization History  Administered Date(s) Administered  . Influenza,inj,Quad PF,6+ Mos 10/26/2017  . Influenza-Unspecified 11/06/2014  . Pneumococcal Polysaccharide-23 03/08/2017   Pertinent  Health Maintenance Due  Topic Date Due  . PNA vac Low Risk Adult (2 of 2 - PCV13) 03/08/2018  . COLONOSCOPY  12/31/2018  . INFLUENZA VACCINE  Completed   Fall Risk  06/09/2017  Falls in the past year? No   Functional Status Survey:    Vitals:   11/08/17 1514  BP: (!) 98/54  Pulse: 100  Resp: 16  He is afebrile  Physical Exam   In general this is a frail appearing elderly male he does not appear to be in any distress but he is not really responding he does have some open mouth breathing.  His skin is positive for Icterus--  Eyes- appear  equal round reactive to light  Oropharynx mucous membranes appear somewhat dry he does have open mouth breathing.  Chest is clear to auscultation with shallow air entry there is no labored breathing.  Heart is regular rate and rhythm without murmur gallop or rub-he does have mild lower extremity edema.  Abdomen is soft does not appear to be tender there are positive bowel sounds.  Musculoskeletal again he is not following verbal commands-- difficult to do a full assessment- he does move his arms at times.  Neurologic as noted above he is not really responding to verbal commands  Psych-as noted above             Labs reviewed: Recent Labs    11/02/17 0439 11/03/17 0443 10/30/2017 0433  NA 127* 129* 131*  K 4.4 4.2 4.4  CL 95* 100 103  CO2 19* 20* 17*  GLUCOSE 133* 129* 121*  BUN 49* 53* 49*  CREATININE 1.22 1.19 1.06  CALCIUM 8.3* 8.0* 7.9*  MG 2.6* 2.7* 2.8*   Recent Labs    11/02/17 1434 11/03/17 0443 10/29/2017 0433  AST 174* 158* 153*  ALT 147* 139* 139*  ALKPHOS 873* 829* 769*  BILITOT 11.0* 10.8* 11.6*  PROT 5.7* 5.4* 5.0*  ALBUMIN 2.4* 2.3* 2.2*   Recent Labs    11/02/17 0439 11/03/17 0443 10/24/2017 0433  WBC 17.6* 23.4* 18.6*  NEUTROABS 16.3* 21.0* 16.6*  HGB 12.3* 11.8* 12.2*  HCT 35.8* 34.6* 35.0*  MCV 93.7 92.3 94.1  PLT 152 162 159   No results found for: TSH Lab Results  Component Value Date   HGBA1C 5.8 (H) 12/05/2014   No results found for: CHOL, HDL, LDLCALC, LDLDIRECT, TRIG, CHOLHDL  Significant Diagnostic Results in last 30 days:  Mr Laqueta Jean Wo Contrast  Result Date: 10/20/2017 CLINICAL DATA:  New diagnosis of metastatic melanoma. History of prostate cancer, esophageal cancer, hypertension and hyperlipidemia. EXAM: MRI HEAD WITHOUT AND WITH CONTRAST TECHNIQUE: Multiplanar, multiecho pulse sequences of the brain and surrounding structures were obtained without and with intravenous contrast. CONTRAST:  7.5 cc Gadavist COMPARISON:   MRI head November 02, 2005 FINDINGS: INTRACRANIAL CONTENTS: No reduced diffusion to suggest acute ischemia or hypercellular tumor. No susceptibility artifact to suggest hemorrhage. The ventricles and sulci are normal for patient's age. Similar minimal supratentorial white matter  FLAIR T2 hyperintensities compatible with chronic small vessel ischemic changes, less than expected for age. No suspicious parenchymal signal, masses, mass effect. No abnormal intraparenchymal or extra-axial enhancement. No abnormal extra-axial fluid collections. No extra-axial masses. VASCULAR: Normal major intracranial vascular flow voids present at skull base. SKULL AND UPPER CERVICAL SPINE: No abnormal sellar expansion. No suspicious calvarial bone marrow signal. Mild cystic degenerative changes C1-2. Craniocervical junction maintained. SINUSES/ORBITS: The mastoid air-cells and included paranasal sinuses are well-aerated.The included ocular globes and orbital contents are non-suspicious. OTHER: None. IMPRESSION: No metastatic disease; negative MRI head with and without contrast for age. Electronically Signed   By: Awilda Metro M.D.   On: 10/20/2017 13:49   Ct Abdomen Pelvis W Contrast  Result Date: 10/24/2017 CLINICAL DATA:  NEW DX MELANOMA, HX PROSTATE CA 2001 WITH SEED, RECTAL CA 2016 WITH SURG ONLY PROSTECTOMY DONE AT THAT TIME, HX HIATAL HERNIA, ESOPHAGEAL CA. PT C/O RLQ ABDOMEN PAIN TODAY. EXAM: CT ABDOMEN AND PELVIS WITH CONTRAST TECHNIQUE: Multidetector CT imaging of the abdomen and pelvis was performed using the standard protocol following bolus administration of intravenous contrast. CONTRAST:  ISOVUE-300 IOPAMIDOL (ISOVUE-300) INJECTION 61% COMPARISON:  PET-CT, 09/16/2017. Chest, abdomen and pelvis CT, 06/21/2016 and 06/20/2015. FINDINGS: Lower chest: Small calcified granuloma at the right lung base. 3 mm noncalcified nodule left lower lobe, image 14, series 5, stable from the CT dated 06/20/2015. No acute  findings. Hepatobiliary: Liver mildly enlarged demonstrating numerous metastatic lesions, which have progressed since the prior PET-CT. A mass in segment 4A measures 3 cm, 1.6 cm on the prior PET-CT. Mass at the dome of segment 7 measures 3.2 cm, previously 1.1 cm. There are multiple new and enlarged metastatic lesions. Small dependent gallstones, stable. No evidence of acute cholecystitis. No bile duct dilation. Pancreas: Unremarkable. No pancreatic ductal dilatation or surrounding inflammatory changes. Spleen: Normal in size without focal abnormality. Adrenals/Urinary Tract: No adrenal masses. Kidneys normal in size, orientation and position with symmetric enhancement and excretion. Stable bilateral low-density renal masses are noted consistent with cysts. No stones. No hydronephrosis. Ureters extend inferiorly into a ileal conduit that lies in the anterior pelvis. There are stones within the ileo conduit, stable from the prior exam. No hydronephrosis. Stomach/Bowel: Well-positioned gastrostomy tube along the anterior stomach. No stomach wall thickening or inflammation. Small bowel is normal in caliber. No wall thickening. No inflammation. In the left mid to lower abdomen, a loop of small bowel enters the colostomy defect. There is no evidence of strangulation or incarceration. Colon is normal in caliber with no wall thickening or inflammation. Left colon extends into a left lower quadrant colostomy. Vascular/Lymphatic: There are enlarged upper abdominal lymph nodes. Largest gastrohepatic lymph node measures 2.7 cm in short axis, previously 1.7 cm. There are enlarged lymph node lies posterior to the pancreatic body and proximal tail measuring 2 cm in short axis, previously 1 cm. A peri celiac node measures 3.4 cm, previously 1.3 cm. A preaortic node just below the left renal vein measures 2.1 cm, previously 8 mm. Aortic atherosclerosis.  No venous thrombosis.  No aneurysm. Reproductive: S/p prostatectomy. Other:  Trace amount of ascites adjacent to the liver and pelvis. Musculoskeletal: No fracture. No osteoblastic or osteolytic lesions. IMPRESSION: 1. There has been significant interval progression of metastatic disease, with numerous new and enlarged liver masses, which now enlarged the liver. This progression has occurred since the PET-CT dated 09/16/2017. There has also been progression upper abdominal adenopathy along the peripancreatic, peri celiac and gastrohepatic ligament change as  well as the upper retroperitoneum. Trace amount of ascites is new from the prior study. 2. No acute findings. No evidence of bowel obstruction or inflammation. 3. Small bowel herniates into the left lower quadrant colostomy site without strangulation, incarceration or obstruction. This is more prominent than it was on the prior PET-CT. Other postsurgical changes are stable. Electronically Signed   By: Amie Portland M.D.   On: 10/24/2017 12:57   Mr 3d Recon At Scanner  Result Date: 11/01/2017 CLINICAL DATA:  Jaundice with left flank pain, generalized malaise and increasing weakness. History of squamous cell esophageal cancer, rectal cancer and remote prostate cancer. Undergoing immunotherapy. EXAM: MRI ABDOMEN WITHOUT AND WITH CONTRAST (INCLUDING MRCP) TECHNIQUE: Multiplanar multisequence MR imaging of the abdomen was performed both before and after the administration of intravenous contrast. Heavily T2-weighted images of the biliary and pancreatic ducts were obtained, and three-dimensional MRCP images were rendered by post processing. CONTRAST:  7.5 cc Gadavist COMPARISON:  Abdominopelvic CT 03/05/2017.  PET-CT 09/16/2017. FINDINGS: Despite efforts by the technologist and patient, mild motion artifact is present on today's exam and could not be eliminated. This reduces exam sensitivity and specificity. Lower chest: There are small bilateral pleural effusions with mild dependent atelectasis at both lung bases. Hepatobiliary: Interval  marked enlargement of the liver which measures approximately 25.2 x 16.5 x 29.4 cm. There are innumerable hepatic lesions which have markedly progressed compared with the PET-CT of 6 weeks ago. Most of the lesions demonstrate low T1 signal, although there are a few lesions with intrinsic T1 shortening. There is heterogeneous T2 signal with areas of hyperintensity. Following contrast, some of the lesions demonstrate heterogeneous peripheral enhancement. Many of the lesions appear necrotic without central enhancement. The vast majority of the hepatic parenchyma is replaced by tumor. The gallbladder appears contracted and is not well visualized. There is no extrahepatic biliary dilatation. Pancreas: Unremarkable. No pancreatic ductal dilatation or surrounding inflammatory changes. Spleen: Normal in size without focal abnormality. Adrenals/Urinary Tract: Both adrenal glands appear normal. There is a simple cyst measuring 2.8 cm in the upper pole the left kidney. No evidence of enhancing renal mass or hydronephrosis. Stomach/Bowel: No evidence of bowel wall thickening, distention or surrounding inflammatory change. Vascular/Lymphatic: There are multiple enlarged lymph nodes within the porta hepatis and upper retroperitoneum, most obvious on the diffusion-weighted images. These were hypermetabolic on previous PET-CT. No acute vascular findings identified. Other: Small amount of perihepatic ascites. Musculoskeletal: Although the marrow appears heterogeneous on diffusion weighted imaging, no focal lesions or abnormal enhancement identified within the spine on T2 weighted or post-contrast imaging. A simple lipoma is noted posterior to the left scapular tip. IMPRESSION: 1. Marked progression of diffuse hepatic metastatic disease compared with PET-CT of 09/16/2017. The liver is markedly enlarged and nearly completely replaced by tumor. 2. Associated metastatic lymphadenopathy in the porta hepatis and upper retroperitoneum. 3.  No evidence of biliary dilatation. The gallbladder is contracted and not well visualized. Jaundice is likely secondary to intrahepatic cholestasis. 4. Small bilateral pleural effusions and mild ascites. Electronically Signed   By: Carey Bullocks M.D.   On: 11/01/2017 14:42   US Venous Img Lower Unilateral Right  Result Date: 11/02/2017 CLINICAL DATA:  Right lower extremity edema. EXAM: RIGHT LOWER EXTREMITY VENOUS DOPPLER ULTRASOUND TECHNIQUE: Gray-scale sonography with graded compression, as well as color Doppler and duplex ultrasound were performed to evaluate the lower extremity deep venous systems from the level of the common femoral vein and including the common femoral, femoral, profunda femoral, popliteal  and calf veins including the posterior tibial, peroneal and gastrocnemius veins when visible. The superficial great saphenous vein was also interrogated. Spectral Doppler was utilized to evaluate flow at rest and with distal augmentation maneuvers in the common femoral, femoral and popliteal veins. COMPARISON:  None. FINDINGS: Contralateral Common Femoral Vein: Respiratory phasicity is normal and symmetric with the symptomatic side. No evidence of thrombus. Normal compressibility. Common Femoral Vein: Occlusive thrombus present up to the saphenofemoral junction. The saphenofemoral junction itself is open. Saphenofemoral Junction: No evidence of thrombus. Profunda Femoral Vein: No evidence of thrombus. Normal compressibility and flow on color Doppler imaging. Femoral Vein: Occlusive thrombus present throughout the femoral vein in the thigh. Popliteal Vein: Occlusive thrombus present. Calf Veins: Thrombus extends into posterior tibial and peroneal veins in the calf. Superficial Great Saphenous Vein: No evidence of thrombus. Normal compressibility. Venous Reflux:  None. Other Findings: No evidence of superficial thrombophlebitis or abnormal fluid collection. IMPRESSION: Positive for right lower extremity  deep venous thrombosis affecting the common femoral vein, femoral vein, popliteal vein and visualized proximal calf veins. Thrombus appears acute/subacute by ultrasound. Electronically Signed   By: Irish Lack M.D.   On: 11/02/2017 10:04   Dg Chest Port 1 View  Result Date: 11/01/2017 CLINICAL DATA:  Acute hepatitis. EXAM: PORTABLE CHEST 1 VIEW COMPARISON:  Chest x-ray dated July 05, 2017. FINDINGS: Unchanged left chest wall port catheter with tip in the mid SVC. The heart size and mediastinal contours are within normal limits. Normal pulmonary vascularity. No focal consolidation, pleural effusion, or pneumothorax. No acute osseous abnormality. IMPRESSION: No active disease. Electronically Signed   By: Obie Dredge M.D.   On: 11/01/2017 13:40   Mr Abdomen Mrcp Vivien Rossetti Contast  Result Date: 11/01/2017 CLINICAL DATA:  Jaundice with left flank pain, generalized malaise and increasing weakness. History of squamous cell esophageal cancer, rectal cancer and remote prostate cancer. Undergoing immunotherapy. EXAM: MRI ABDOMEN WITHOUT AND WITH CONTRAST (INCLUDING MRCP) TECHNIQUE: Multiplanar multisequence MR imaging of the abdomen was performed both before and after the administration of intravenous contrast. Heavily T2-weighted images of the biliary and pancreatic ducts were obtained, and three-dimensional MRCP images were rendered by post processing. CONTRAST:  7.5 cc Gadavist COMPARISON:  Abdominopelvic CT 03/05/2017.  PET-CT 09/16/2017. FINDINGS: Despite efforts by the technologist and patient, mild motion artifact is present on today's exam and could not be eliminated. This reduces exam sensitivity and specificity. Lower chest: There are small bilateral pleural effusions with mild dependent atelectasis at both lung bases. Hepatobiliary: Interval marked enlargement of the liver which measures approximately 25.2 x 16.5 x 29.4 cm. There are innumerable hepatic lesions which have markedly progressed compared  with the PET-CT of 6 weeks ago. Most of the lesions demonstrate low T1 signal, although there are a few lesions with intrinsic T1 shortening. There is heterogeneous T2 signal with areas of hyperintensity. Following contrast, some of the lesions demonstrate heterogeneous peripheral enhancement. Many of the lesions appear necrotic without central enhancement. The vast majority of the hepatic parenchyma is replaced by tumor. The gallbladder appears contracted and is not well visualized. There is no extrahepatic biliary dilatation. Pancreas: Unremarkable. No pancreatic ductal dilatation or surrounding inflammatory changes. Spleen: Normal in size without focal abnormality. Adrenals/Urinary Tract: Both adrenal glands appear normal. There is a simple cyst measuring 2.8 cm in the upper pole the left kidney. No evidence of enhancing renal mass or hydronephrosis. Stomach/Bowel: No evidence of bowel wall thickening, distention or surrounding inflammatory change. Vascular/Lymphatic: There are multiple enlarged  lymph nodes within the porta hepatis and upper retroperitoneum, most obvious on the diffusion-weighted images. These were hypermetabolic on previous PET-CT. No acute vascular findings identified. Other: Small amount of perihepatic ascites. Musculoskeletal: Although the marrow appears heterogeneous on diffusion weighted imaging, no focal lesions or abnormal enhancement identified within the spine on T2 weighted or post-contrast imaging. A simple lipoma is noted posterior to the left scapular tip. IMPRESSION: 1. Marked progression of diffuse hepatic metastatic disease compared with PET-CT of 09/16/2017. The liver is markedly enlarged and nearly completely replaced by tumor. 2. Associated metastatic lymphadenopathy in the porta hepatis and upper retroperitoneum. 3. No evidence of biliary dilatation. The gallbladder is contracted and not well visualized. Jaundice is likely secondary to intrahepatic cholestasis. 4. Small  bilateral pleural effusions and mild ascites. Electronically Signed   By: Carey Bullocks M.D.   On: 11/01/2017 14:42    Assessment/Plan  #1 history of malignant melanoma with metastatic disease to liver with jaundice and acute hepatitis- he continues under hospice care he has had a significant decline in status this morning.  He it does not appear to be in pain at this time- he does have an order for tramadol routinely have asked nursing to hold this for now secondary to sedation concerns and since he does not appear to be uncomfortable.  However I suspect shortly he will need aggressive management-and most likely Roxanol- we will address this or hospice may later in the day as his condition warrants.  Suspect most of his medicine will need to be discontinued as well-- will await hospice input.   Addendum.  Later in the afternoon I did reassess Mr. Knaus-he actually was somewhat more responsive did speak a bit to family members- but continued to be very frail-appearing- apparently he is taking his medicines and did take Ativan with apparently good effect- he  also received tramadol.  I did discuss with patient's  family at bedside about options moving forward that at one point he may benefit from Roxanol-- again will await hospice input on this as well who will apparently see him later today.  Physical exam-- he was  more alert but still did not really follow verbal commands-otherwise exam was fairly unchanged although he was more responsive--and had spoken apparently a bit with visitors--and was tolerating his medicines   Again will await hospice input about further issues including pain control and discontinuing certain medications.  [id number]

## 2017-11-16 ENCOUNTER — Other Ambulatory Visit (HOSPITAL_COMMUNITY): Payer: PPO

## 2017-11-16 ENCOUNTER — Ambulatory Visit (HOSPITAL_COMMUNITY): Payer: PPO | Admitting: Hematology

## 2017-11-16 ENCOUNTER — Ambulatory Visit (HOSPITAL_COMMUNITY): Payer: PPO

## 2017-11-18 DEATH — deceased

## 2018-03-16 ENCOUNTER — Other Ambulatory Visit (HOSPITAL_COMMUNITY): Payer: Self-pay | Admitting: Hematology

## 2018-03-16 DIAGNOSIS — C155 Malignant neoplasm of lower third of esophagus: Secondary | ICD-10-CM

## 2019-08-07 IMAGING — US US RENAL
1 series · 14 of 25 positions shown · non-contrast
Comparison: CT scan of June 21, 2016 and renal ultrasound August 22, 2015.

CLINICAL DATA: History of previous cystectomy with apparent ileal
conduit. Follow-up of renal cysts.

EXAM:
RENAL / URINARY TRACT ULTRASOUND COMPLETE

[Series 1: us renal · 0.25mm/px · 14 of 41 slices shown]
[im 1/41]
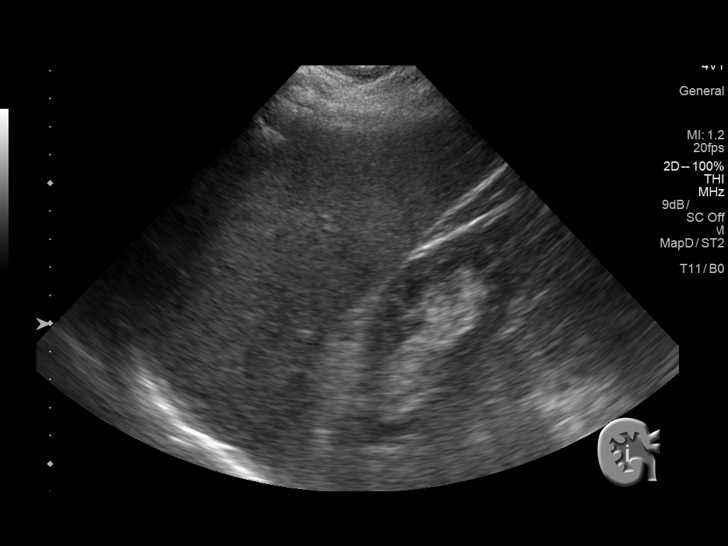
[im 4/41]
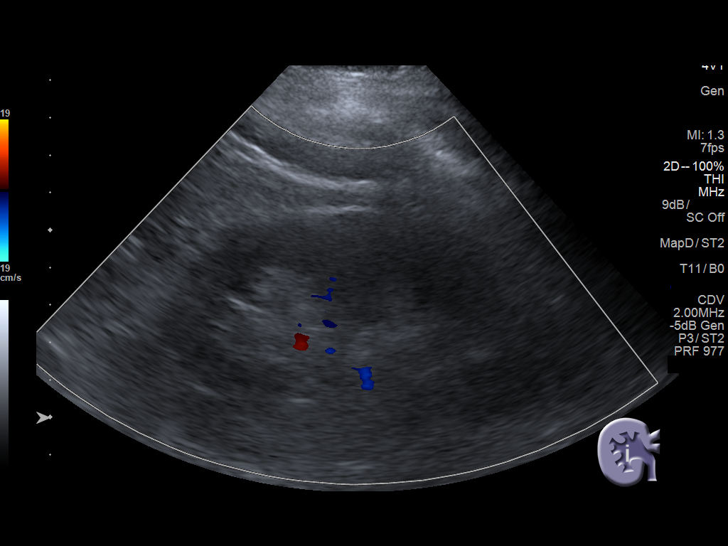
[im 7/41]
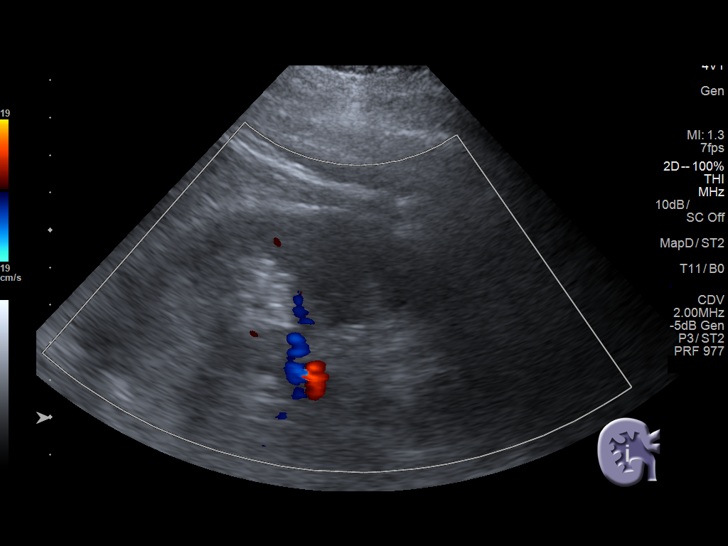
[im 11/41]
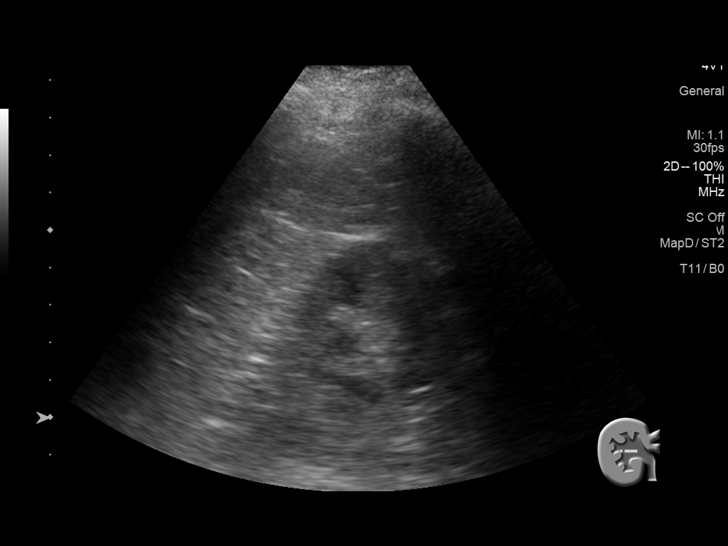
[im 14/41]
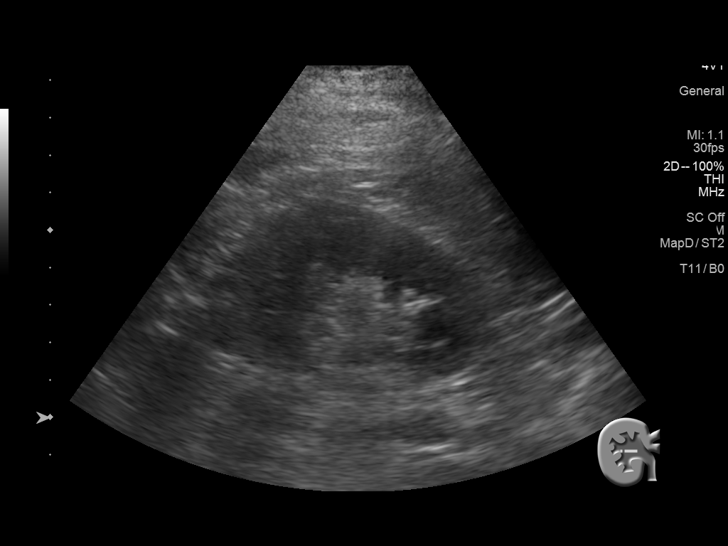
[im 16/41]
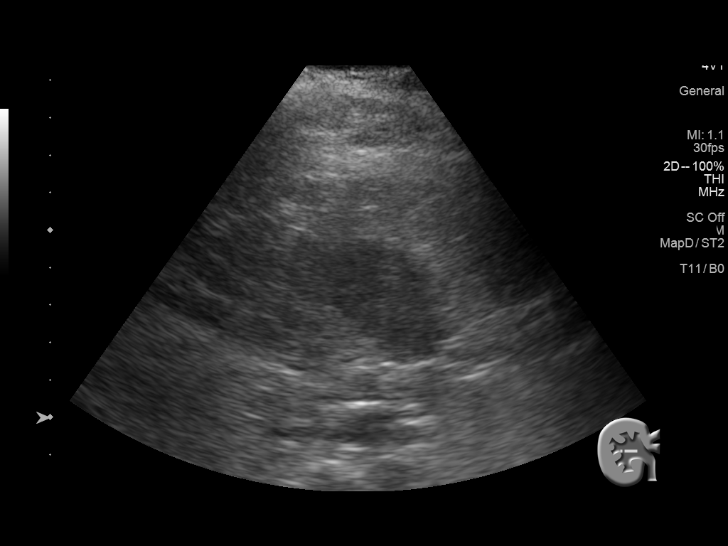
[im 19/41]
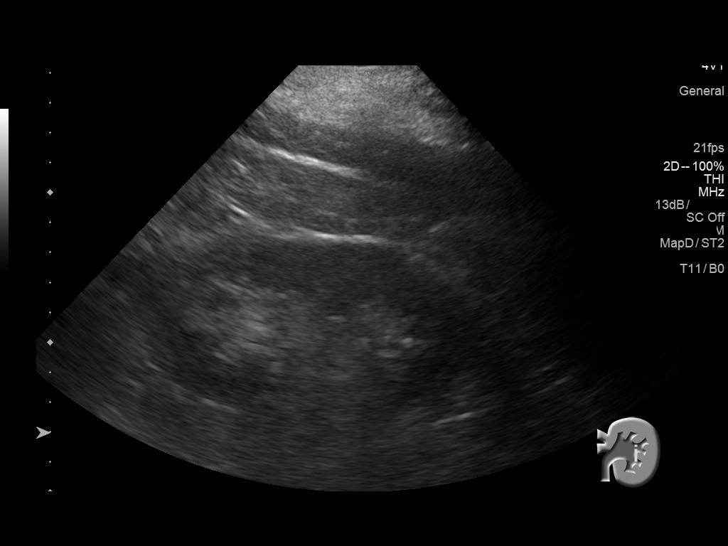
[im 22/41]
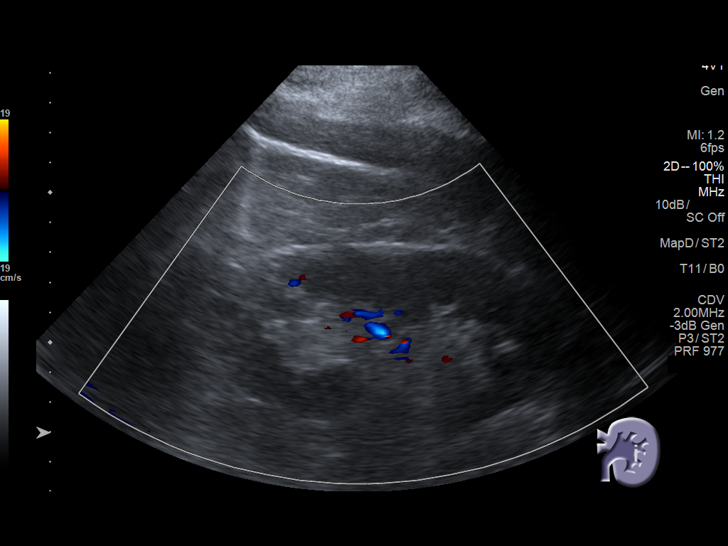
[im 26/41]
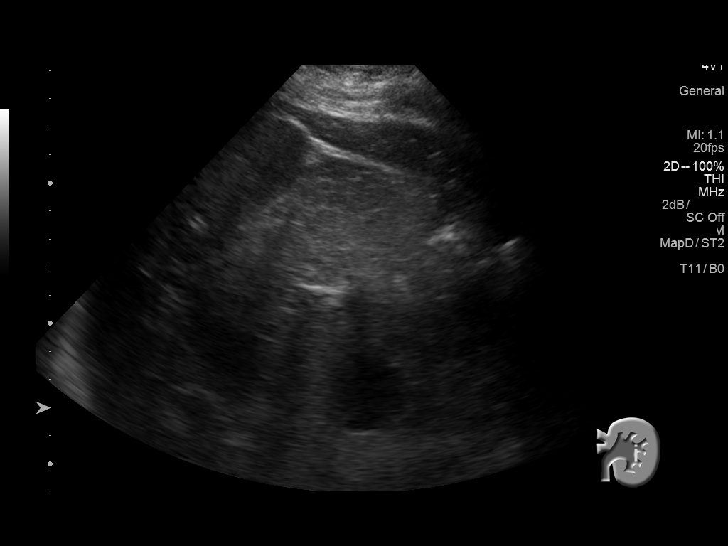
[im 27/41]
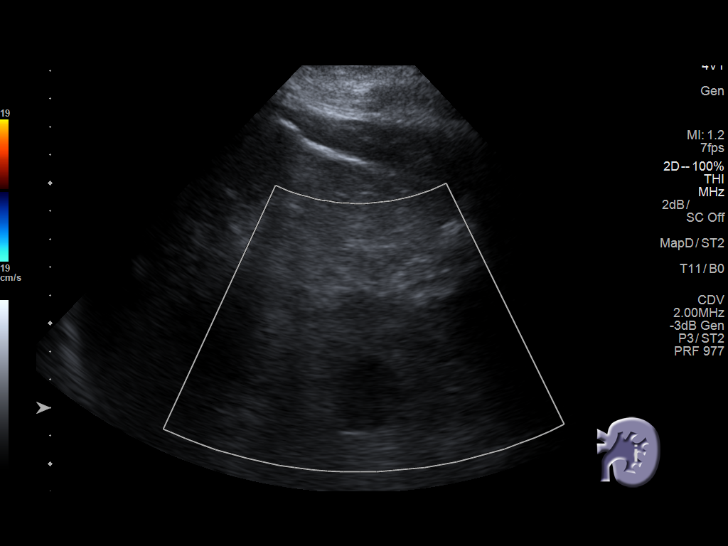
[im 31/41]
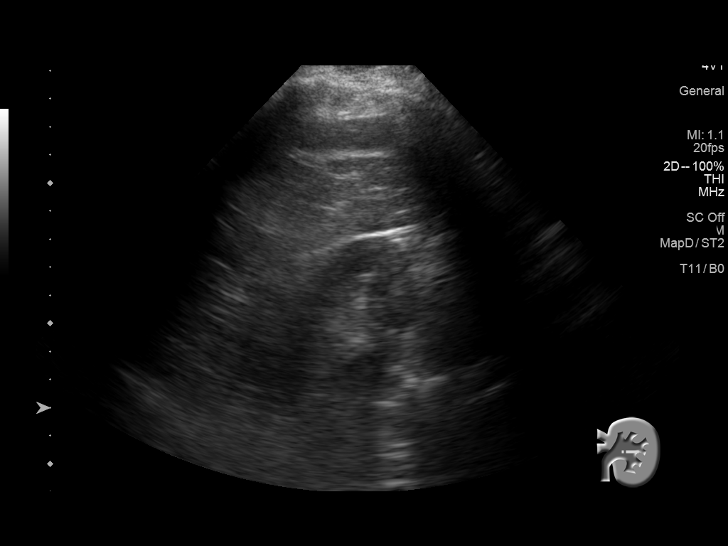
[im 34/41]
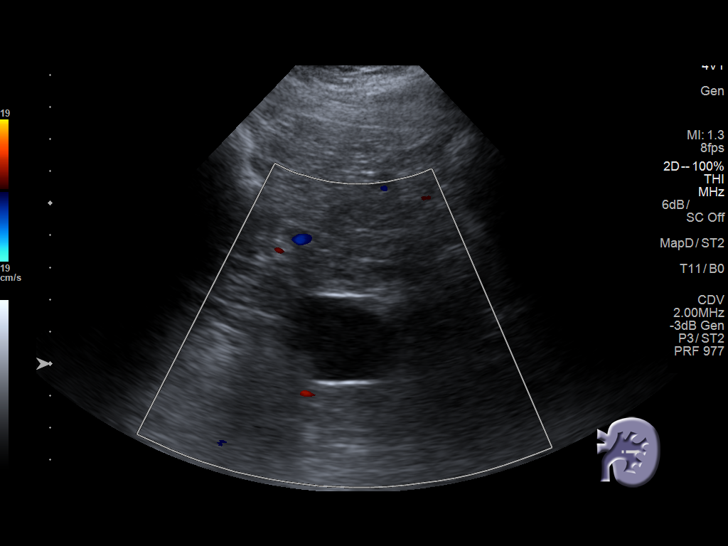
[im 37/41]
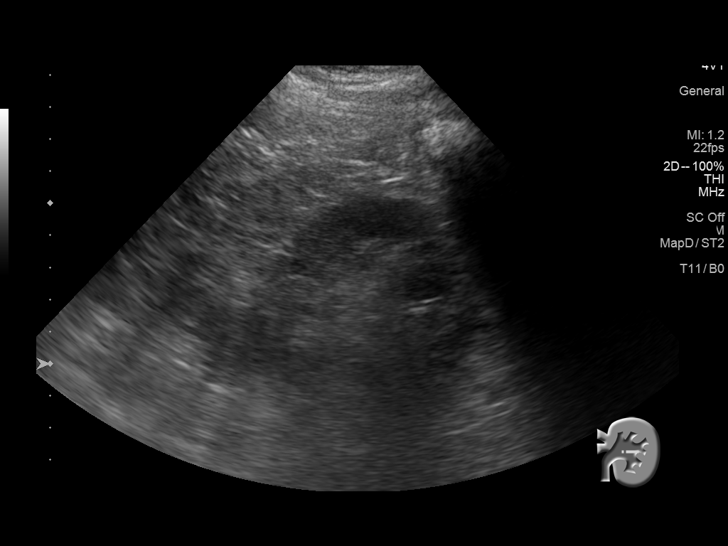
[im 41/41]
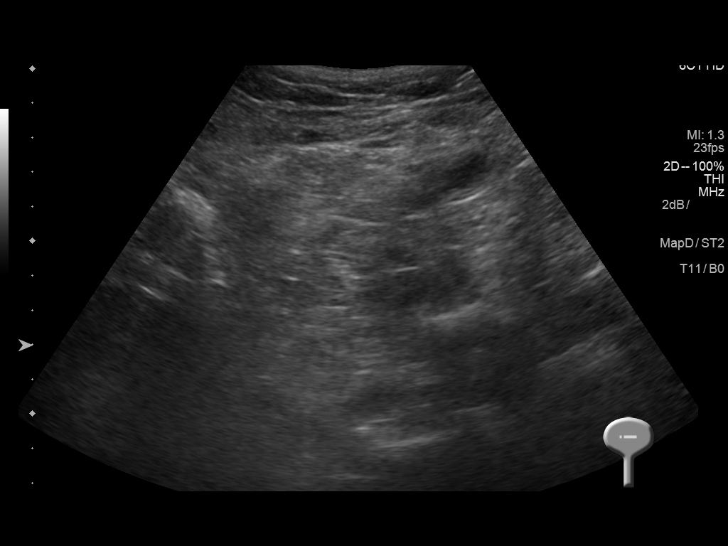

[14 of 25 positions shown; findings below may reference images not displayed]

FINDINGS: Right Kidney:

Length: 10.7 cm. The renal cortical echotexture is lower than that
of the adjacent liver. There is no hydronephrosis.

Left Kidney:

Length: 11.0 cm. There is an upper pole anechoic structure
compatible with a cyst measuring 2.8 x 2.9 x 3.2 cm. The renal
cortical echotexture is similar to that on the right. There is no
hydronephrosis.

Bladder:

The urinary bladder is surgically absent.
IMPRESSION: Stable appearing upper pole cyst of the left kidney. The tiny lower
pole cysts seen bilaterally are not evident on today's study. No
hydronephrosis.

## 2020-05-20 IMAGING — CT NM PET TUM IMG RESTAG (PS) SKULL BASE T - THIGH
1 of 8 series · 1 of 25 positions shown · non-contrast
Comparison: None PET-CT 05/16/2017, 03/05/2017

CLINICAL DATA: Subsequent treatment strategy for esophageal
carcinoma.. Chemo radiation therapy complete.

EXAM:
NUCLEAR MEDICINE PET SKULL BASE TO THIGH
TECHNIQUE: 9.0 mCi F-18 FDG was injected intravenously. Full-ring PET imaging
was performed from the skull base to thigh after the radiotracer. CT
data was obtained and used for attenuation correction and anatomic
localization.
Fasting blood glucose: 94 mg/dl

[Series 4: ct sk_thigh 5.0 b31f · axial · 5.0mm · 0.98mm/px · 1 of 235 slices shown]
[im 235/235  brain]
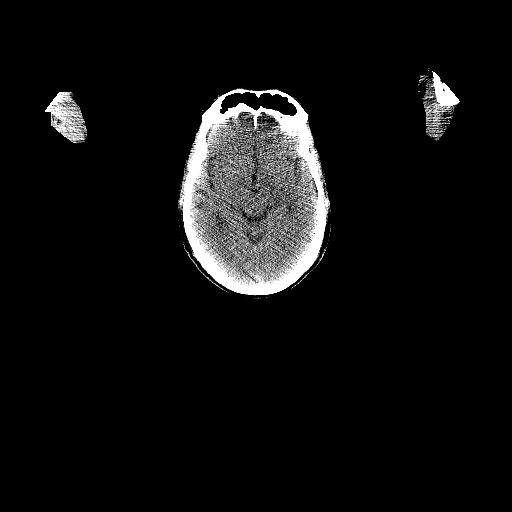

[1 of 25 positions shown; findings below may reference images not displayed]

FINDINGS: Mediastinal blood pool activity: SUV max

NECK: Is

Incidental CT findings: none

CHEST: Mild activity through the esophagus several cm below the
carina SUV max equal 4.9 compared with 6.5 on prior PET-CT scan.

Persistent hypermetabolic bilateral hilar lymph nodes with SUV max
equal 4.6 .

Within the RIGHT lower lobe, there is a new 17 mm semi-solid nodule
abutting the pleural surface (image 47/8) SUV max equal 5.7.

Incidental CT findings: none

ABDOMEN/PELVIS: Unfortunately, there are multiple new hypermetabolic
lesions within the LEFT and RIGHT hepatic lobe. These hypermetabolic
lesions correlate withwell-defined discrete low-density lesions on
comparison noncontrast CT. Example lesion the RIGHT hepatic lobe
with SUV max equal 6.0 measures 2.5 cm (image 113/14). Example
lesion in the LEFT hepatic lobe SUV max equal 7.6 measures 1.4 cm

Approximately 30 lesions in the liver ranging size from 1 cm up to 4
cm.

New hypermetabolic lymph nodes in the gastrohepatic ligament. For
example 13 mm node with SUV max equal 12.3 on image 109/4. A similar
node adjacent to the pancreas on image 114/4. Several small
metastatic lymph nodes anterior to the aorta at the level of the SMA
are intensely hypermetabolic for size with SUV max equal 6.3. No
metastatic adenopathy in the pelvis.

Incidental CT findings: None

SKELETON: No focal hypermetabolic activity to suggest skeletal
metastasis.

Incidental CT findings: none
IMPRESSION: 1. Unfortunately, multiple new hypermetabolic hepatic metastasis
throughout the liver.
2. New hypermetabolic metastatic adenopathy in the upper abdomen.
3. Persistent hypermetabolic hilar lymph nodes (reactive adenopathy
versus metastatic adenopathy)
4. New hypermetabolic RIGHT lower lobe pulmonary nodule with
differential including small focus infection versus pulmonary
metastasis.
5. Decrease in size and metabolic activity of the primary lesion in
distal esophagus.

These results will be called to the ordering clinician or
representative by the Radiologist Assistant, and communication
documented in the PACS or zVision Dashboard.
